# Patient Record
Sex: Male | Born: 1945 | Race: White | Hispanic: No | Marital: Married | Smoking: Never smoker
Health system: Southern US, Community
[De-identification: ages and names within clinical notes are randomized; demographics above are authoritative.]

## PROBLEM LIST (undated history)

## (undated) ENCOUNTER — Emergency Department (HOSPITAL_COMMUNITY): Discharge status: Absent without leave | Payer: Medicare Other

## (undated) DIAGNOSIS — C801 Malignant (primary) neoplasm, unspecified: Secondary | ICD-10-CM

## (undated) DIAGNOSIS — Z923 Personal history of irradiation: Secondary | ICD-10-CM

## (undated) DIAGNOSIS — C61 Malignant neoplasm of prostate: Secondary | ICD-10-CM

## (undated) HISTORY — PX: PROSTATE BIOPSY: SHX241

## (undated) HISTORY — PX: PROSTATECTOMY: SHX69

## (undated) MED FILL — Dexamethasone Sodium Phosphate Inj 100 MG/10ML: INTRAMUSCULAR | Qty: 1 | Status: AC

---

## 1999-04-30 ENCOUNTER — Encounter: Payer: Self-pay | Admitting: Internal Medicine

## 1999-04-30 ENCOUNTER — Ambulatory Visit (HOSPITAL_COMMUNITY): Admission: RE | Admit: 1999-04-30 | Discharge: 1999-04-30 | Payer: Self-pay | Admitting: Internal Medicine

## 2001-10-30 ENCOUNTER — Encounter: Payer: Self-pay | Admitting: Urology

## 2001-11-05 ENCOUNTER — Encounter (INDEPENDENT_AMBULATORY_CARE_PROVIDER_SITE_OTHER): Payer: Self-pay | Admitting: *Deleted

## 2001-11-05 ENCOUNTER — Inpatient Hospital Stay (HOSPITAL_COMMUNITY): Admission: RE | Admit: 2001-11-05 | Discharge: 2001-11-07 | Payer: Self-pay | Admitting: Urology

## 2001-12-04 ENCOUNTER — Ambulatory Visit: Admission: RE | Admit: 2001-12-04 | Discharge: 2001-12-26 | Payer: Self-pay | Admitting: Radiation Oncology

## 2003-03-31 ENCOUNTER — Ambulatory Visit: Admission: RE | Admit: 2003-03-31 | Discharge: 2003-06-04 | Payer: Self-pay | Admitting: Radiation Oncology

## 2004-01-31 ENCOUNTER — Encounter (INDEPENDENT_AMBULATORY_CARE_PROVIDER_SITE_OTHER): Payer: Self-pay | Admitting: Specialist

## 2004-01-31 ENCOUNTER — Ambulatory Visit (HOSPITAL_COMMUNITY): Admission: RE | Admit: 2004-01-31 | Discharge: 2004-01-31 | Payer: Self-pay | Admitting: Gastroenterology

## 2004-03-18 ENCOUNTER — Inpatient Hospital Stay (HOSPITAL_COMMUNITY): Admission: EM | Admit: 2004-03-18 | Discharge: 2004-03-22 | Payer: Self-pay | Admitting: Emergency Medicine

## 2006-07-11 ENCOUNTER — Inpatient Hospital Stay (HOSPITAL_COMMUNITY): Admission: EM | Admit: 2006-07-11 | Discharge: 2006-07-12 | Payer: Self-pay | Admitting: Emergency Medicine

## 2006-07-11 ENCOUNTER — Ambulatory Visit: Payer: Self-pay | Admitting: Internal Medicine

## 2009-02-01 ENCOUNTER — Ambulatory Visit (HOSPITAL_COMMUNITY): Admission: RE | Admit: 2009-02-01 | Discharge: 2009-02-01 | Payer: Self-pay | Admitting: Urology

## 2010-07-11 NOTE — H&P (Signed)
NAME:  George, Nichols NO.:  [id number]   MEDICAL RECORD NO.:  [id number]          PATIENT TYPE:  INP   LOCATION:  0102                         FACILITY:  [facility]   PHYSICIAN:  Ladell Pier, M.D.   DATE OF BIRTH:  September 15, 1945   DATE OF ADMISSION:  07/11/2006  DATE OF DISCHARGE:                              HISTORY & PHYSICAL   CHIEF COMPLAINT:  Shortness of breath.   HISTORY OF PRESENT ILLNESS:  The patient is a 65 year old, white male  that presents to the emergency room with shortness of breath.  He stated  that he woke up about 1 a.m. this morning, short of breath.  He  experienced no chest pain, no nausea, no vomiting.  He was worried  because he has a history of PE.  He had his Coumadin level checked this  morning, and his INR was 2.3.  He feels fine now.   PAST MEDICAL HISTORY:  1. 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  2. History of prostate cancer that he had a radical prostatectomy in      September 2003 with elevated PSA in March 2005, status post recent      radiation therapy.  Patient followed by Dr. Isabel Caprice.   FAMILY HISTORY:  His father is 24 years old with diabetes, history of  blood clot and skin cancer and multiple other problems.  His mother died  in her 63s from a motor vehicle accident.   SOCIAL HISTORY:  He is married.  He has a son that is 74 years old.  Moderate alcohol use.  He quit smoking 23 years ago.  He plays in a  band.  He is a Technical sales engineer.   MEDICATIONS:  Coumadin daily.   ALLERGIES:  PENICILLIN.   REVIEW OF SYSTEMS:  As per stated in HPI.   PHYSICAL EXAMINATION:  VITAL SIGNS:  Temperature 97.9, pulse 73,  respirations 20, blood pressure 120/79.  HEENT: Head is normocephalic, atraumatic.  Pupils equal, round and  reactive to light.  Throat without erythema.  CARDIOVASCULAR:  Regular rate and rhythm.  LUNGS: Clear bilaterally.  ABDOMEN:  Positive bowel sounds.  EXTREMITIES:  No  edema.   LABORATORY DATA:  Sodium 142, potassium 3.7, chloride 106, CO2 27, BUN  11, creatinine 0.97, glucose 110.  PT 29.7, INR 2.6.  WBC 7.9,  hemoglobin 15.3, platelet 244.  Cardiac enzymes negative.  EKG showed a  flutter, now in normal sinus rhythm.   ASSESSMENT/PLAN:  1. A flutter/shortness of breath:  The patient's EKG findings have      resolved.  Will repeat the EKG in the morning.      Will cycle enzymes.  Will get a 2-D echo.  Will continue his      Coumadin therapy.  He had a CT of the chest done in the ER; results      are pending.  2. PE:  Will continue his Coumadin therapy.      Ladell Pier, M.D.  Electronically Signed     NJ/MEDQ  D:  07/11/2006  T:  07/11/2006  Job:  [id number]   cc:   Merlene Laughter. Renae Gloss, M.D.  Fax: [phone]

## 2010-07-11 NOTE — Discharge Summary (Signed)
NAME:  [name], [name] NO.:  [id number]   MEDICAL RECORD NO.:  [id number]          PATIENT TYPE:  INP   LOCATION:  1437                         FACILITY:  [facility]   PHYSICIAN:  Ellie Lunch, M.D.      DATE OF BIRTH:  [birth year]   DATE OF ADMISSION:  07/11/2006  DATE OF DISCHARGE:  07/12/2006                               DISCHARGE SUMMARY   PRIMARY CARE PHYSICIAN:  Dr. Andi Devon.   DISCHARGE DIAGNOSES:  Atrial flutter with spontaneous resolution.  (Enzymes were negative, the patient ruled out for a myocardial  infarction.  TSH was negative.  A 2D echo was pending on the day of  discharge.)   PAST MEDICAL HISTORY:  1. History of  DVT.  2. History of prostate cancer that required radical prostatectomy in      September of 2003.  Note the patient's PSA checked in the hospital      was 0.8.  3. Low LDL at 35.   DISCHARGE MEDICATIONS:  1. Coumadin as previously taken.  2. Fish oil supplements 1000 mg p.o. once a day.   FOLLOW UP:  The patient will follow-up with Dr. Renae Gloss within the next  month. Please follow up on 2Decho report since it was pending on the day  of discharge. Note if he has further episodes of atrial flutter, he may  be referred to a cardiologist for radiofrequency ablation.   ADMISSION HISTORY AND PHYSICAL:  [name] is a very pleasant 65-year-  old white male that presented to the emergency department with shortness  of breath that woke  him up at 1 AM in the morning.  He experienced no  chest pain, nausea or vomiting.  He was worried because he had a history  of pulmonary embolism.  He had a Coumadin level checked on the morning  of admission and his INR was 2.3.   HOSPITAL COURSE:  The patient was admitted for further monitoring.  He  was monitored from a tele bed.  He was noted to have atrial flutter on  the EKG that was taken in the emergency department.  Enzymes were cycled  which were negative.  Also a 2D echocardiogram was  done, the preliminary  reading of which was normal.  A TSH was also checked which was normal at  1.3.  His atrial flutter spontaneously resolved.  Also note hemoglobin  was 14.9.  Thus, the patient is being discharged to home.  If he would  have further episodes, he can be referred to a cardiologist for further  evaluation.   DISCHARGE LABORATORY DATA:  TSH 1.314, PSA 0.8, hemoglobin 14.9,  creatinine 1.17, INR is 3.  Total cholesterol was 167, triglycerides 80,  HDL 35, LDL 116.      Ellie Lunch, M.D.  Electronically Signed     BP/MEDQ  D:  07/12/2006  T:  07/12/2006  Job:  [id number]   cc:   Merlene Laughter. Renae Gloss, M.D.

## 2010-07-14 NOTE — Discharge Summary (Signed)
   NAME:  [name], [name] NO.:  [id number]   MEDICAL RECORD NO.:  [id number]                   PATIENT TYPE:  INP   LOCATION:  0371                                 FACILITY:  [facility]   PHYSICIAN:  Valetta Fuller, M.D.               DATE OF BIRTH:  1945-09-04   DATE OF ADMISSION:  11/05/2001  DATE OF DISCHARGE:  11/07/2001                                 DISCHARGE SUMMARY   DISCHARGE DIAGNOSIS:  Adenocarcinoma of the prostate, pathologic stage pT3b.   PROCEDURE PERFORMED:  Pelvic lymph node dissection and radical retropubic  prostatectomy on November 05, 2001.   HOSPITAL COURSE:  The patient is an otherwise healthy 65 year old male.  He  was noted to have a rising PSA which had increased to approximately 7.  He  had a biopsy which revealed a positive biopsy with a Gleason 4 + 3 = 7  adenocarcinoma of the prostate.  After extensive counseling, the patient  elected to have pelvic lymph node dissection and radical retropubic  prostatectomy.  His admission data and physical exam were unremarkable.   On November 05, 2001 the patient underwent pelvic lymph node exploration  without obvious gross nodal involvement.  His radical retropubic  prostatectomy was fairly uneventful.  There was moderate blood loss but his  hemoglobin remained stable.  His postoperative course was quite uneventful.  He remained afebrile with stable vital signs.  His hemoglobin came down to  approximately 10.7.  The rest of his labs were normal.  He had excellent  urinary output with minimal JP drainage.  The JP was removed on  postoperative day #2.  The patient was ambulating at that time and  tolerating a general diet well.  He requested to go home on postoperative  day #2, and at that point again was doing quite well clinically.  He was  discharged to home.   DISPOSITION:  The patient was discharged to home with a Foley catheter  indwelling to a leg bag.  He was given some pain  medication to help and will  be seen in our office in one week for staple removal and 10 days for Foley  catheter removal.                                                 Valetta Fuller, M.D.    DSG/MEDQ  D:  11/17/2001  T:  11/17/2001  Job:  [id number]

## 2010-07-14 NOTE — Op Note (Signed)
NAME:  [name], [name] NO.:  [id number]   MEDICAL RECORD NO.:  [id number]          PATIENT TYPE:  AMB   LOCATION:  ENDO                         FACILITY:  [facility]   PHYSICIAN:  Anselmo Rod, M.D.  DATE OF BIRTH:  [birth year]   DATE OF PROCEDURE:  01/31/2004  DATE OF DISCHARGE:                                 OPERATIVE REPORT   PROCEDURE PERFORMED:  Colonoscopy with biopsies times two (cold biopsies).   ENDOSCOPIST:  Charna Elizabeth, M.D.   INSTRUMENT USED:  Olympus video colonoscope.   INDICATIONS FOR PROCEDURE:  The patient is a 65 year old white male with a  personal history of prostate cancer diagnosed and treated in 2003 undergoing  screening colonoscopy to rule out colonic polyps, masses, etc.   PREPROCEDURE PREPARATION:  Informed consent was procured from the patient.  The patient was fasted for eight hours prior to the procedure and prepped  with a bottle of magnesium citrate and a gallon of GoLYTELY the night prior  to the procedure.   PREPROCEDURE PHYSICAL:  The patient had stable vital signs.  Neck supple.  Chest clear to auscultation.  S1 and S2 regular.  Abdomen soft with normal  bowel sounds.   DESCRIPTION OF PROCEDURE:  The patient was placed in left lateral decubitus  position and sedated with 5 mg of Demerol and 6 mg of Versed in slow  incremental doses.  Once the patient was adequately sedated and maintained  on low flow oxygen and continuous cardiac monitoring, the Olympus video  colonoscope was advanced from the rectum to the cecum.  The appendicular  orifice and ileocecal valve were clearly visualized and photographed.  A  small erosion was biopsied times two from the terminal ileum.  No masses or  diverticula were seen.  No polyps were identified.  Retroflexion in the  rectum revealed a small internal hemorrhoid.  The patient tolerated the  procedure well without complication.   IMPRESSION:  1.  Essentially normal colonoscopy up to the  cecum except for small internal      hemorrhoids.  2.  Small erosion biopsied times two from the terminal ileum.  3.  No masses, polyps, or diverticula were seen.   RECOMMENDATIONS:  1.  Await pathology results.  2.  Repeat colonoscopy is recommended in the next five years unless the      patient develops any abnormal symptoms in the interim.  3.  Outpatient followup as need arises in the future.      Jyot   JNM/MEDQ  D:  01/31/2004  T:  01/31/2004  Job:  [id number]   cc:   Merlene Laughter. Renae Gloss, M.D.  [address]  [address]  [place]  [place] [address]  Fax: [phone]   Valetta Fuller, M.D.  [address]  [place]  [place] [address]  Fax: [phone]   Maryln Gottron, M.D.  [address] [facility]  [place]  [place] [address]  Fax: [phone]

## 2010-07-14 NOTE — Op Note (Signed)
[name], FACKLER NO.:  [id number]   MEDICAL RECORD NO.:  [id number]                   PATIENT TYPE:  INP   LOCATION:  0371                                 FACILITY:  [facility]   PHYSICIAN:  Valetta Fuller, M.D.               DATE OF BIRTH:  30-May-1945   DATE OF PROCEDURE:  11/05/2001  DATE OF DISCHARGE:                                 OPERATIVE REPORT   PREOPERATIVE DIAGNOSIS:  Clinical stage T1C adenocarcinoma of the prostate.   POSTOPERATIVE DIAGNOSIS:  Clinical state T1C adenocarcinoma of the prostate.   PROCEDURE PERFORMED:  __________ dissection and radical retropubic  prostatectomy.   SURGEON:  Valetta Fuller, M.D.   ASSISTANT:  Crecencio Mc, MD   ANESTHESIA:  General endotracheal.   INDICATIONS:  The patient is a 65 year old male.  He has been noted by his  primary care physician to have a rising PSA.  It has approximately doubled  for two years.  His PSA when we evaluated him was approximately 7 with a  significantly reduced PSA 2 reading.  The left-sided biopsies revealed  adenocarcinoma of the prostate.  The Gleason score was 4 + 3 = 7.  After  discussion, we elected not to do bone scan or CT imaging.  The patient  understands he has at least a 40% chance of having microscopic disease  outside his prostate.  He has a relatively small prostate on exam and no  clinical symptoms.  The patient underwent extensive counseling with regard  to treatment options.  We felt that given his young age and good health,  that radical retropubic prostatectomy was probably the best option for him.  He appeared to understand the benefits of this approach as well as the  potential complications.  The complications include, but are not limited to  operative or perioperative death from cardiac or pulmonary complications  such as DVT, pulmonary embolus, etc.  He understands there is a significant  risk of bleeding and potential for transfusion.  He  understands there is a  risk of infection.  We talked about impotence.  He was told that he would  probably have excision of the left bundle, given the 40% of the biopsy  material being involved with the more aggressive cancer on that side.  The  determination will be made at the time of surgery.  He understands the  issues with regard to __________ and stress incontinence.  Full informed  consent was obtained.   TECHNIQUE AND FINDINGS:  The patient was brought to the operating room where  he had successful induction of general endotracheal anesthesia.  He was  placed in the supine position with a small bump under the small of his back.  He was prepped and draped in the normal manner.  A Foley catheter was placed  sterilely on the field, and the bladder was drained.  A standard lower  midline incision was  performed, and the retropubic space was entered.  A  self-retaining retractor was utilized.  Attention was turned to the lymph  nodes, and we removed the obturator packets bilaterally.  These were sent  for permanent section only.  We did not appreciate any evidence of obvious  clinical adenopathy.  The prostate was mobile and approximately 30 g in  size.  The endopelvic fascia was opened bilaterally, and the puboprostatic  ligaments were identified and transected.   Attention was then turned toward the dorsal vein.  A large radial clamp was  placed between the urethra and the dorsal vein complex and doubly ligated.  A suture ligature was also placed, and the dorsal vein was then transected.  The underlying urethra was identified, was dissected free laterally of the  neurovascular bundles.  The anterior aspect of the urethra was then  transected near the apex of the prostate, and the Foley catheter was grabbed  and brought out the pelvic incision after transecting it.  The posterior  wall of the urethra was then transected.  We sharply dissected some  rectourethralis tissue, and then  the posterior plane of the rectum was very  easy to establish.  Neurovascular bundles were identified.  The prostate was  palpated, and there was induration in the left lateral aspect of the  prostate.  For that reason, we made an incision to take the left bundle  widely.  On the right side, we did open up the endopelvic fascia off the  prostate and attempted a nerve-sparing procedure.  The underlying seminal  vesicles were identified which then allowed to establish the proper plane  for the pedicles which were taken with a series of clips and ties.   Attention was then turned to the bladder neck.  Using a combination of sharp  and blunt dissection technique, the bladder neck was separated from the  prostate, attempting to preserve the majority of the circular fibers.  There  was a small middle lobe, so we did make a slightly larger bladder neck  opening to assure adequate margins.  We then were able to identify both  ureteral orifices without difficulty.  The vas were identified in the  midline and clipped and transected.  The seminal vesicles were then  dissected out; the seminal vesicle arteries were clipped, and the entire  specimen was removed.  There was a moderate amount of blood loss at this  point.  The patient remained hemodynamically stable.  We did not feel  transfusion was indicated.  The patient then had a small degree of  reconstruction of the bladder neck in a tennis racquet type of manner.  Approximately two sutures were used to buttress this posteriorly.  The  mucosa was then everted with some interrupted Vicryl suture.  We then used a  urethral dilator to establish the urethral stump.  Then 2-0 Vicryl were used  in five places.  Two were placed posteriorly, two laterally, and one  directly anteriorly.  The sutures were then placed in the corresponding  positions of the reconstructed bladder neck, and this was done over a 22 [place] Foley catheter.  The sutures were then  tied, and the anastomosis  appeared very secure.  No leakage was noted with significant irrigation.  A  Jackson-Pratt drain was placed in the retropubic space.  The wound was  copiously irrigated.  The fascia was closed with a running #1 PDS, and the  skin was closed with clips.  He appeared to tolerate  the procedure well.  He  was brought to the recovery room in stable condition.                                               Valetta Fuller, M.D.    DSG/MEDQ  D:  11/05/2001  T:  11/06/2001  Job:  [id number]

## 2010-07-14 NOTE — H&P (Signed)
NAME:  [name], [name] NO.:  [id number]   MEDICAL RECORD NO.:  [id number]          PATIENT TYPE:  EMS   LOCATION:  ED                           FACILITY:  [facility]   PHYSICIAN:  Hettie Holstein, D.O.    DATE OF BIRTH:  12/01/1945   DATE OF ADMISSION:  03/18/2004  DATE OF DISCHARGE:                                HISTORY & PHYSICAL   PRIMARY CARE PHYSICIAN:  Merlene Laughter. Renae Gloss, M.D.   GASTROENTEROLOGIST:  Anselmo Rod, M.D.   CHIEF COMPLAINT:  Passed out and leg swelling.   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  He stated that he fell  twice today at home.  His wife witnessed one episode.  She said this loss of  consciousness lasted less than a minute.  He had some shortness of breath  and diaphoresis as well as some chest discomfort.  He had been traveling  quite extensively with his music.  He went on an extensive road trip in  October and November.  Had some lower leg swelling at that time. In  addition, he had some increasing leg swelling over the last three or four  days.  His wife says she noticed some bruising. Did not seek any medical  attention.  He attributed all this to old basketball injuries in the past.  He does continue to remain active, exercising daily and doing yoga, however.  He, as noted above, has felt short of breath.  He went to the emergency  department, Dr. Bruce Donath evaluated him and performed CT scan that did  reveal extensive bilateral pulmonary emboli and he started on a heparin  drip.  He was hemodynamically stable in the emergency department and initial  lab evaluations were within normal limits.  He did have evidence of  incomplete right bundle branch block on his EKG.   PAST MEDICAL HISTORY:  1.  The patient underwent most recently endoscopic evaluation by Dr. Charna Elizabeth and this revealed some inflammatory tissue.  2.  In addition, he has a  previous history of prostate cancer, stage T-III-      B, diagnosed September 2003.  He underwent radical prostatectomy and      lymph node dissection at that time and underwent radiation therapy in      April 29, 2003 due to elevating PSA.  He states that this recently      trended down to 0 following radiation therapy.  Currently, the family      requests the perform a repeat PSA this admission.  3.  On review of chest x-rays in the past, he was noted to have fibrotic      changes.  Explanation of these is not clear at this time.   SOCIAL HISTORY:  The patient quit smoking tobacco 20-30 years.  Moderate  alcohol. However, he denies withdrawal symptoms or heavy alcohol use.   FAMILY HISTORY:  He has one son, age 62.  Father is alive at age 86.  Had a  blood  clot during convalescence for a knee injury.  Mother died at age 63's  with an MVA.  There is hypertension in his family.   REVIEW OF SYSTEMS:  He has had no nausea or vomiting, diarrhea, weight loss,  abdominal pain.  Only shortness of breath and chest pain essentially with  HPI.  He has had lower extremity swelling for the past month or so.  Otherwise further review of systems is unremarkable.   REVIEW OF SYSTEMS:  GENERAL:  The patient denies fevers, chills, night  sweats.  Denies cephalgia.  CARDIOVASCULAR:  The patient denies chest pain,  palpitations.  RESPIRATORY:  Denies shortness of breath.  Does report cough  with frequent expectoration of mucus.  However, she says this is chronic.  She denies hemoptysis.  GI:  She does report positive nausea but no  vomiting, no hematemesis.  No hematochezia or melena was reported and she  denies orthopnea or PND or exertional dyspnea.  She reports no swelling of  her lower extremities.  No calf tenderness.   PHYSICAL EXAMINATION:  VITAL SIGNS:  The patient's blood pressure in the  emergency department revealed 114/69, heart rate 99.  O2 saturation was 93%  on room air.  GENERAL:   Pleasant, alert, Caucasian male incision and drainage.  Alert and  oriented x3.  HEENT:  Normocephalic, atraumatic.  Extraocular muscles were intact.  Oropharynx clear.  NECK:  Supple, nontender.  CARDIOVASCULAR:  Normal S1 and S2 without S3 or S4, or murmur.  PULMONARY:  Clear breath sounds bilaterally.  Normal effort.  No dullness to  percussion.  ABDOMEN:  Soft, nontender. No palpitation mass or hepatosplenomegaly.  No  suprapubic or costovertebral angle tenderness.  NEUROLOGIC:  The patient is euthymic.  His affect is stable.  There are no  focal neurologic deficits.  EXTREMITIES:  Edema in the left leg greater than right and some minimal  tenderness.   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 Urinalysis was unremarkable. Initial  point of care obtained was negative.  Urinalysis was negative.  PTT was 25.  Sodium 134, potassium 36, BUN 16, creatinine 1.2, glucose 111, AST/ALT  24/19.  Alk phos 46, albumin 3.3.  WBC 10.6, hemoglobin 13.9, platelet count  188. MCV normal.  Colonoscopy reviewed from December 2005, performed by Dr.  Loreta Ave.  Pathology report reviewed.   IMPRESSION:  1.  Acute bilateral pulmonary emboli.  2.  History of prostate cancer, status post radiation therapy in March.  3.  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  4.  Hypokalemia and hypoalbuminemia.   PLAN:  Going to admit [name] to a telemetry floor for close monitoring  as initiated on heparin drip in the emergency department.  Will continue  this for 24 hours and then initiated Coumadin per pharmacy.  Follow his  course to therapeutic INR between 2 and 3.  Follow his hemodynamics closely  and replete his potassium and provide Coumadin instructions.  I will check  his cardiac markers in addition and continue to follow his course to  therapeutic INR.     Eric  ESS/MEDQ  D:  03/18/2004  T:  03/18/2004  Job:   [id number]   cc:   Merlene Laughter. Renae Gloss, M.D.  [address]  [address]  [place]  [place] [address]  Fax: [phone]

## 2010-07-14 NOTE — H&P (Signed)
NAME:  [name], [name] NO.:  [id number]   MEDICAL RECORD NO.:  [id number]                   PATIENT TYPE:  INP   LOCATION:  Z610                                 FACILITY:  [facility]   PHYSICIAN:  Valetta Fuller, M.D.               DATE OF BIRTH:  June 17, 1945   DATE OF ADMISSION:  11/05/2001  DATE OF DISCHARGE:                                HISTORY & PHYSICAL   CHIEF COMPLAINT:  Clinical stage II, 1C adenocarcinoma of the prostate.   HISTORY OF PRESENT ILLNESS:  The patient is a 65 year old male.  He was sent  because of an asymptomatic rise in his PSA.  His PSA had been increasing  over approximately a two-year period.  It was approximately 7.  Digital  rectal exam was unremarkable.  He underwent an ultrasound which showed a 26-  gram prostate.  He had 30% of the material on the left side involved with a  Gleason 4+3 equals 7 adenocarcinoma of the prostate.  We did not do CT or  bone scan given his PSA of less than 10.  He understands he has at least a  40% chance of having microscopic disease outside his prostate.  The patient  has undergone extensive counseling with regard to treatment options.  He has  elected to have radical retropubic prostatectomy.  He presents now for that  procedure and will be admitted postoperatively for routine care.  He has no  voiding complaints or other clinical symptoms.   PAST MEDICAL HISTORY:  Relatively unremarkable.  He generally enjoys good  health.  He has no systemic medical illnesses and takes no medication  regularly.  He has no drug allergies and, really, no surgical history.  He  has a remote tobacco use history.   FAMILY HISTORY:  Negative for prostate cancer.   REVIEW OF SYSTEMS:  Otherwise negative.   PHYSICAL EXAMINATION:  GENERAL:  Well-developed, well-nourished male.  VITAL SIGNS:  Height 6 feet 4 inches, weight 210 pounds.  Blood pressure  122/70, pulse 60.  NECK:  Without masses or JVD.  CHEST:   Clear.  ABDOMEN:  Soft and nontender.  RECTAL:  The prostate is 1+ in size, without worrisome nodules or  induration.  GENITOURINARY:  Penis, scrotum, testes, and adnexal structures are normal.  EXTREMITIES:  Without edema or tenderness.   LABORATORY DATA:  All blood work was well within normal limits.   ASSESSMENT:  Clinical stage II, 1C adenocarcinoma of the prostate.  The  patient will undergo pelvic lymph node dissection and radical retropubic  prostatectomy today and will, hopefully, be admitted for routine  postoperative care.                                               Valetta Fuller,  M.D.    DSG/MEDQ  D:  11/05/2001  T:  11/05/2001  Job:  [id number]

## 2010-07-14 NOTE — Discharge Summary (Signed)
NAME:  [name], [name] NO.:  [id number]   MEDICAL RECORD NO.:  [id number]          PATIENT TYPE:  INP   LOCATION:  0363                         FACILITY:  [facility]   PHYSICIAN:  [name], M.D. DATE OF BIRTH:  [birth year]   DATE OF ADMISSION:  03/18/2004  DATE OF DISCHARGE:                                 DISCHARGE SUMMARY   PRIMARY CARE PHYSICIAN:  Dr. Kellie Nichols.   FINAL DIAGNOSES:  1.  Bilateral pulmonary embolism.  2.  Left leg deep vein thrombosis.   PROCEDURES:  1.  Venous duplex of both legs.  2.  Chest CT scan with angiography.   HISTORY OF PRESENT ILLNESS:  [name] is a 65 year old gentleman who  arrived in the hospital's emergency department with a chief complaint of  passing out and leg swelling.  He stated that he fell twice while at home on  the day of admission.  His wife was a witness to one of the episodes and  stated that the patient lost consciousness for approximately 1 minute.  He  had also displayed shortness of breath, diaphoresis, and chest discomfort.  He is a traveling musician and reported that he has been traveling quite  extensively.  His previous trips were road trips during the months of  October and November.  During that time he noticed some swelling in his leg.  He also mentioned progressive leg swelling over the 3-4 days leading up to  this admission.  Some bruising was also noticed in that region.   PAST MEDICAL HISTORY:  1.  Endoscopy by Dr. Anselmo Nichols which revealed inflammatory tissue.  2.  Prostate cancer stage T3b diagnosed September 2003.  He underwent      radical prostatectomy and lymph node dissection followed by radiation      therapy on April 29, 2003 due to a PSA elevation.  3.  Fibrotic changes noticed on previous chest x-rays, etiology for which is      not clear.   SOCIAL HISTORY:  Cigarettes:  The patient stopped 20-30 years ago.  Alcohol:  Positive moderate alcohol.   FAMILY HISTORY:   [name] had a blood clot.  [name] died at the age of 17s  following a motor vehicle accident.   HOSPITAL COURSE:  While in the emergency room, the patient had a chest x-ray  completed, the final impression of which was:  1.  Mild cardiomegaly with vascular congestion.  2.  Stable nodular opacity in the posterior right fourth rib, probably a      bone island given the greater than 2 year interval stability.   This was followed by CT scan of the chest with angio.  The radiologist's  final impression was:  1.  Extensive bilateral acute pulmonary emboli.  2.  Right upper chest subpleural partially calcified nodule, nonspecific in      appearance.  Recommend follow-up to document stability in 3-6 months.  3.  Bibasilar atelectasis.   The patient was admitted into the medicine floor for further evaluation and  treatment.  He was started on IV heparin per PE protocol.  Likewise,  this  was followed by Coumadin.  His INR was slow to respond to the Coumadin and  the patient remained very anxious to be discharged from the hospital.  Therefore, the decision was made to start the patient on Lovenox to act as a  bridge while the Coumadin becomes therapeutic.  As of today, the patient's  INR is currently 1.4.  Over the course of his hospitalization the patient  never complained of any chest pain or shortness of breath.  Likewise, the  patient did not have any repeat episodes of passing out.   LEFT LOWER EXTREMITY DEEP VEIN THROMBOSIS.  As mentioned previously the  patient stated that over the past couple of days prior to this admission he  noticed his lower extremity swelling progressively.  Venous studies of the  lower extremity were completed, the final impression of which was within the  left leg there appears to be a DVT throughout the common femoral, profunda,  popliteal, and posterior tibial veins.  There is no evidence of superficial  thrombosis or Bakers cyst.  In addition, the right leg was  also examined.  The final impression of that was no evidence of DVT, superficial thrombosis,  or Bakers cyst.  Over the course of the patient's hospitalization, he did  not complain of any pain within his lower extremity but did mention that  there was some swelling that was evident within the left lower extremity.  Today, the patient has no current symptoms.  He states that he is ready to  go home today.  He denies having any current shortness of breath, no chest  pain.  He has been ambulating around the nurse's station without any  problems or signs of distress.  His vital signs this morning:  His  temperature was 97.8, heart rate 64, respirations 22, and blood pressure  114/63.  SPO2 was 98% on lung air.  His lungs are clear bilaterally.  The  patient also had some labs completed.  He had a protein C which was 79,  protein S which was 73 - both of which were slightly below the reference  range.  He also had a lupus anticoagulant study completed, the results of  which showed the lupus anticoagulant PTT-LA to be 186.  The dRVTT is 39.4.  There is also the reporting of another PTT-LA confirmation which is found to  be 5.7.  The lupus anticoagulant on this second study is found to be none  detected.  The patient had a homocysteine level which was reported to be  11.25.   CONDITION AT THE TIME OF DISCHARGE:  Improved.  The patient will be  discharged home today.  His primary care physician, Dr. Kellie Nichols, has  already been called and notified of his current medical condition.   He will be discharged home on the following medications:  1.  Coumadin 7.5 mg tablet - he will be instructed to take two tablets      tonight.  Tomorrow he should take one-and-a-half tablets, then on      Friday, January 27, he will report to Dr. Augustin Nichols office at 9      a.m. at which time his INR should be rechecked and following this she     will instruct the patient the dosage of his Coumadin to  take.  2.  In addition, the patient will be discharged home on Lovenox 100 mg subcu      q.12h.  The patient received education regarding  anticoagulation and      also has informed me today that he received instructions on how to      inject himself with the Lovenox and he states that he feels comfortable      doing so.      OR/MEDQ  D:  03/22/2004  T:  03/22/2004  Job:  [id number]   cc:   Merlene Laughter. Renae Gloss, M.D.  [address]  [address]  [place]  [place] [address]  Fax: [phone]

## 2011-02-23 ENCOUNTER — Other Ambulatory Visit (HOSPITAL_COMMUNITY): Payer: Self-pay | Admitting: Urology

## 2011-02-23 DIAGNOSIS — C61 Malignant neoplasm of prostate: Secondary | ICD-10-CM

## 2011-02-26 ENCOUNTER — Telehealth: Payer: Self-pay | Admitting: Oncology

## 2011-02-26 NOTE — Telephone Encounter (Signed)
S/w pt re appt for 1/10 @ 10:30 am w/FS

## 2011-02-28 ENCOUNTER — Telehealth: Payer: Self-pay | Admitting: Oncology

## 2011-02-28 NOTE — Telephone Encounter (Signed)
Referred by Dr. Isabel Caprice Dx- Prostate Ca

## 2011-03-07 ENCOUNTER — Other Ambulatory Visit: Payer: Self-pay | Admitting: Oncology

## 2011-03-07 DIAGNOSIS — C61 Malignant neoplasm of prostate: Secondary | ICD-10-CM

## 2011-03-08 ENCOUNTER — Telehealth: Payer: Self-pay | Admitting: Oncology

## 2011-03-08 ENCOUNTER — Other Ambulatory Visit (HOSPITAL_BASED_OUTPATIENT_CLINIC_OR_DEPARTMENT_OTHER): Payer: Self-pay

## 2011-03-08 ENCOUNTER — Ambulatory Visit: Payer: Self-pay | Admitting: Oncology

## 2011-03-08 ENCOUNTER — Ambulatory Visit: Payer: Self-pay

## 2011-03-08 VITALS — BP 137/80 | HR 66 | Temp 96.9°F | Ht 74.4 in | Wt 232.4 lb

## 2011-03-08 DIAGNOSIS — C61 Malignant neoplasm of prostate: Secondary | ICD-10-CM

## 2011-03-08 LAB — COMPREHENSIVE METABOLIC PANEL
CO2: 26 mEq/L (ref 19–32)
Calcium: 9.2 mg/dL (ref 8.4–10.5)
Chloride: 104 mEq/L (ref 96–112)
Creatinine, Ser: 1.03 mg/dL (ref 0.50–1.35)
Glucose, Bld: 103 mg/dL — ABNORMAL HIGH (ref 70–99)
Sodium: 140 mEq/L (ref 135–145)
Total Bilirubin: 0.6 mg/dL (ref 0.3–1.2)
Total Protein: 7.1 g/dL (ref 6.0–8.3)

## 2011-03-08 LAB — CBC WITH DIFFERENTIAL/PLATELET
Basophils Absolute: 0 10*3/uL (ref 0.0–0.1)
Eosinophils Absolute: 0.1 10*3/uL (ref 0.0–0.5)
HGB: 14 g/dL (ref 13.0–17.1)
LYMPH%: 18.2 % (ref 14.0–49.0)
MCV: 88 fL (ref 79.3–98.0)
MONO%: 3.8 % (ref 0.0–14.0)
NEUT#: 5.6 10*3/uL (ref 1.5–6.5)
Platelets: 222 10*3/uL (ref 140–400)

## 2011-03-08 NOTE — Progress Notes (Signed)
Note Dictated

## 2011-03-08 NOTE — Telephone Encounter (Signed)
gve the pt his march 2013 appts. °

## 2011-03-08 NOTE — Progress Notes (Signed)
CC:   Merlene Laughter. Renae Gloss, M.D. Valetta Fuller, MD  REASON FOR CONSULTATION:  Prostate cancer.  HISTORY OF PRESENT ILLNESS:  This is a 66 year old gentleman, native of [place], lived the majority of his life around this area.  He currently works as a Technical sales engineer and has done so for the majority of his life.  He travels rather extensively for different performances.  He has a diagnosis of prostate cancer dating back through 2003.  He had presented with an elevated PSA of up to 6.45.  He underwent biopsy that was done on October 09, 2001, which showed a Gleason score 4 + 3 equals 7 adenocarcinoma of the left prostate, occupying about 30%.  The patient subsequently underwent a prostatectomy and lymph node dissection that was done on 11/05/2001.  Case number [id number] showed a prostatic adenocarcinoma, Gleason score 4 + 3 equals 7 involving both the right and the left prostate.  There was focal extracapsular extension identified.  Both seminal vesicles were involved with adenocarcinoma. Resection margins were free of tumor, indicating a stage T3b N0.  He had 10 lymph nodes sampled; none of them were involved with the cancer.  The patient subsequently has done relatively well.  However, his PSA started to rise again 3 years later.  Approximately about 18 months later, his PSA became detectable and he received radiation therapy under the care of Dr. Dayton Scrape.  He received a total of 6480 cGy in 36 sessions between February and April 2005.  Subsequently the patient had a continued rise in PSA up to 35 in August 2011 and at that time he was started on Lupron and in December 2011 it went down to 4.11.  In April 2012 it was down to 3.17 and 3.8 in August 2012, and most recently was up to 5.91 with a testosterone level of 35.  That was done on February 22, 2011.  For that reason, the patient was referred to me for evaluation.  Clinically he is asymptomatic.  He is scheduled to have a bone scan in  the near future for restaging purposes, but he is not reporting any back pain, is not reporting any shoulder pain, is not reporting any hip pain, has not had any genitourinary complaints.  He continues to have excellent performance status, exercising regularly, and again performing most activities of daily living without any hindrance or decline.  REVIEW OF SYSTEMS:  He does not report any headaches, blurry vision, double vision.  Does not report any motor or sensory neuropathy.  Does not report any alteration in mental status.  Does not report any psychiatric issues or depression.  Does not report any fever, chills, sweats.  Does not report any cough, hemoptysis, hematemesis.  No nausea or vomiting.  No abdominal pain.  No hematochezia or melena.  No genitourinary complaints.  Rest of review of systems is unremarkable.  PAST MEDICAL HISTORY:  Significant for history of back pain.  He has a history of atrial flutter.  He has also had a history of a pulmonary embolus a few years ago, maybe related to his travel at that time and he elected to be chronically anticoagulated for life at this time.  MEDICATIONS:  He is on Lupron, as well as Coumadin.  He also takes calcium and vitamin D supplements.  ALLERGIES:  To penicillin.  FAMILY HISTORY:  His father died of complications of diabetes.  Mother died of a car accident.  He has 1 brother who had head and neck cancer. No  known history of prostate cancer in his family.  SOCIAL HISTORY:  He is married.  He has 1 son.  He works as a Artist.  He had a remote smoking history, quit about 20 to 30 years ago.  No excessive alcohol consumption.  PHYSICAL EXAMINATION:  General:  Alert, awake gentleman, appeared in no active distress.  Vital Signs:  His blood pressure is 137/80, pulse is 66, respirations 20, he is afebrile at 96.9.  HEENT:  Head is normocephalic, atraumatic.  Pupils equal, round, and reactive to light. Oral  mucosa moist and pink.  Neck:  Supple.  No lymphadenopathy.  Heart: Regular rate and rhythm, S1 and S2.  Lungs:  Clear to auscultation.  No rhonchi, wheezes, or dullness to percussion.  Abdomen:  Soft, nontender. No hepatosplenomegaly.  Extremities:  No clubbing, cyanosis, or edema. Neurologically:  Intact motor, sensory, and deep tendon reflexes.  LABORATORY DATA:  Showed a hemoglobin of 14.0, white cell count 7.4, platelet count of 222.  ASSESSMENT AND PLAN:  This is a pleasant 66 year old gentleman with the following issues:  1. Prostate cancer.  He is a gentleman with a Gleason score of 4 + 3     equals 7 diagnosed in 2003.  He is status post prostatectomy,     followed by radiation therapy and currently on hormone therapy     after a PSA that has risen up to 35.  He had a reasonable response     to the PSA.  Unfortunately about 14 to 16 months later was found to     have a rise in his PSA despite castrate levels of testosterone.  I     had a lengthy discussion today with Mr. Shorey today, discussing     the natural course of prostate cancer, more specifically hormone     sensitive and possibly hormone resistant cancer.  I believe that he     is on the verge of becoming castration-resistant disease and just     about the right time for it for that appropriate Gleason score.  I     do agree with the 1st step of restaging purposes.  He is getting a     bone scan in the near future.  Terms of treatment options again     were outlined today in detail to Mr. Mcdonald today that include     combined androgen deprivation with addition of Casodex to Lupron     that would be a possibility, given his testosterone is approaching     castrate level, but not quite below 20, so that certainly could be     a possibility.  Second-line hormone manipulation with ketoconazole,     prednisone, or Zytiga are also a possibility.  I also talked to him     about the role of Provenge immunotherapy and I  think he will be an     excellent candidate for it should he have measurable disease.  I     also talked about the role of chemotherapy, as well as clinical     trials.  At this time, there is really no role for chemotherapy at     this time given the fact that he is really asymptomatic and has no     measurable disease, but certainly a possibility down the line in     the future.  The plan will be at this point to have a quick     followup  in about 2 months' time, also see the results of the bone     scan and we will decide on treatment at that time. 2. Pulmonary embolus.  At this time, he is chronically anticoagulated.     I am not really in favor of stopping his anticoagulation at this     time given the fact that he feels uncomfortable doing so.    ______________________________ Benjiman Core, M.D. FNS/MEDQ  D:  03/08/2011  T:  03/08/2011  Job:  [id number]

## 2011-03-09 ENCOUNTER — Encounter: Payer: Self-pay | Admitting: Oncology

## 2011-03-09 NOTE — Progress Notes (Signed)
Patient approve for 100% Discount 02/23/11 - 08/24/11

## 2011-03-12 ENCOUNTER — Encounter (HOSPITAL_COMMUNITY)
Admission: RE | Admit: 2011-03-12 | Discharge: 2011-03-12 | Disposition: A | Discharge status: Home / Self Care | Payer: Self-pay | Source: Ambulatory Visit | Attending: Urology | Admitting: Urology

## 2011-03-12 ENCOUNTER — Encounter (HOSPITAL_COMMUNITY): Payer: Self-pay

## 2011-03-12 DIAGNOSIS — C61 Malignant neoplasm of prostate: Secondary | ICD-10-CM | POA: Insufficient documentation

## 2011-03-12 HISTORY — DX: Malignant (primary) neoplasm, unspecified: C80.1

## 2011-03-12 MED ORDER — TECHNETIUM TC 99M MEDRONATE IV KIT
25.0000 | PACK | Freq: Once | INTRAVENOUS | Status: AC | PRN
  Administered 2011-03-12: 25 via INTRAVENOUS

## 2011-05-09 ENCOUNTER — Telehealth: Payer: Self-pay | Admitting: Oncology

## 2011-05-09 ENCOUNTER — Ambulatory Visit (HOSPITAL_BASED_OUTPATIENT_CLINIC_OR_DEPARTMENT_OTHER): Payer: Self-pay | Admitting: Oncology

## 2011-05-09 ENCOUNTER — Other Ambulatory Visit (HOSPITAL_BASED_OUTPATIENT_CLINIC_OR_DEPARTMENT_OTHER): Payer: Self-pay | Admitting: Lab

## 2011-05-09 VITALS — BP 131/78 | HR 63 | Temp 97.0°F | Ht 74.4 in | Wt 232.2 lb

## 2011-05-09 DIAGNOSIS — C61 Malignant neoplasm of prostate: Secondary | ICD-10-CM

## 2011-05-09 LAB — CBC WITH DIFFERENTIAL/PLATELET
Basophils Absolute: 0 10*3/uL (ref 0.0–0.1)
Eosinophils Absolute: 0.2 10*3/uL (ref 0.0–0.5)
HCT: 40.3 % (ref 38.4–49.9)
HGB: 13.7 g/dL (ref 13.0–17.1)
MCV: 90.7 fL (ref 79.3–98.0)
MONO%: 6 % (ref 0.0–14.0)
NEUT#: 3.6 10*3/uL (ref 1.5–6.5)
NEUT%: 67.6 % (ref 39.0–75.0)
RDW: 13.3 % (ref 11.0–14.6)
lymph#: 1.2 10*3/uL (ref 0.9–3.3)

## 2011-05-09 LAB — COMPREHENSIVE METABOLIC PANEL
Albumin: 4 g/dL (ref 3.5–5.2)
BUN: 16 mg/dL (ref 6–23)
Calcium: 9.2 mg/dL (ref 8.4–10.5)
Chloride: 103 mEq/L (ref 96–112)
Creatinine, Ser: 1.02 mg/dL (ref 0.50–1.35)
Glucose, Bld: 102 mg/dL — ABNORMAL HIGH (ref 70–99)
Potassium: 4.5 mEq/L (ref 3.5–5.3)

## 2011-05-09 NOTE — Telephone Encounter (Signed)
gv pt appt schedule for may.  °

## 2011-05-09 NOTE — Progress Notes (Signed)
CC:   Merlene Laughter. Renae Gloss, M.D. Valetta Fuller, MD  PRINCIPAL DIAGNOSIS:  A 66 year old gentleman with prostate cancer diagnosed in 2003.  He had a Gleason score 4 + 3 equals 7.  PSA 6.45.  PRIOR THERAPY: 1. He is status post a prostatectomy and lymph node dissection done on     November 05, 2001.  He had a Gleason score 4 + 3 equals 7.  He had     focal extracapsular extension, both seminal vesicles were involved     indicating his stage was T3b N0. 2. The patient received a total of 6480 cGy of radiation therapy in 36     fractions between February and April 2005. 3. The patient had a rise in August of 2011 up to 35 and he was     started on Lupron.  His PSA nadir down to 4.11.  Most recently his     PSA was up to 5.91 in December of 2012 with a testosterone level of     35.  His staging workup did not reveal any bony metastasis.  CURRENT THERAPY:  He has continued to receive androgen deprivation with Lupron.  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  HISTORY OF PRESENT ILLNESS:  Mr. Leinberger presents today for a followup visit.  He is a pleasant gentleman who presents today after I saw him initially back in January of 2013.  He is reporting no new symptoms along at this time.  He is not reporting any abdominal pain.  He has not reported any genitourinary complaints.  He does not report any complication related to Lupron such as hot flashes or breast tenderness. He does not report any back pain.  He has not reported any shoulder pain.  Performance status and activity level remains unchanged.  REVIEW OF SYSTEMS:  Otherwise reviewed and was unremarkable.  PHYSICAL EXAMINATION:  Alert, awake gentleman appeared in no active distress.  Blood pressure is 131/78, pulse 63, respirations 20, temperature is 97.  Head:  Normocephalic, atraumatic.  Pupils equal, round, reactive to light.  Oral mucosa moist and pink.  Neck:   Supple without lymphadenopathy.  Heart:  Regular rate and rhythm.  S1, S2. Lungs:  Clear to auscultation without rhonchi or wheeze.  No dullness to percussion.  Abdomen:  Soft, nontender.  No hepatosplenomegaly. Extremities:  No edema.  LABORATORY DATA:  Hemoglobin 13.7, white cell count 5.3, platelet count 230.  His PSA is currently pending.  ASSESSMENT AND PLAN: 1. This is a pleasant 66 year old gentleman with prostate cancer.  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  I am     rechecking his testosterone level as well as his PSA.  If his PSA     is continuing to rise despite close to castrate level testosterone,     talked about adding Casodex for combined androgen deprivation.     Risks and benefits were discussed today, toxicities that include     hot flashes, breast tenderness, GI toxicity, liver function     abnormalities were discussed today.  He is agreeable to proceed     after we get his PSA, and I will discuss that with him after we get     the results. 2. Androgen deprivation.  He continues to be on Lupron under the care     of Dr. Isabel Caprice. 3. Bony disease.  His bone scan done in January 2013 was negative.  ______________________________ Benjiman Core, M.D. FNS/MEDQ  D:  05/09/2011  T:  05/09/2011  Job:  [id number]

## 2011-05-09 NOTE — Progress Notes (Signed)
Note Dictated

## 2011-05-11 ENCOUNTER — Encounter: Payer: Self-pay | Admitting: Oncology

## 2011-05-11 NOTE — Progress Notes (Signed)
Patient received one prescription from [facility] on 05/10/11 $3.82,his remaninig balance [facility] $396.18.

## 2011-06-05 ENCOUNTER — Encounter: Payer: Self-pay | Admitting: Oncology

## 2011-06-05 NOTE — Progress Notes (Signed)
Patient received one prescription from [facility] on 06/04/11 $3.82,his remaninig balance [facility] $392.36.

## 2011-06-27 NOTE — Progress Notes (Signed)
Received office notes from Dr. David Grapey @ [facility]; forwarded to Dr. Shadad. 

## 2011-07-02 ENCOUNTER — Other Ambulatory Visit: Payer: Self-pay | Admitting: Oncology

## 2011-07-04 ENCOUNTER — Encounter: Payer: Self-pay | Admitting: Oncology

## 2011-07-04 NOTE — Progress Notes (Signed)
Patient received one prescription from [facility] on 07/03/11 $6.13,his remaninig balance [facility] $386.23.

## 2011-07-17 ENCOUNTER — Ambulatory Visit (HOSPITAL_BASED_OUTPATIENT_CLINIC_OR_DEPARTMENT_OTHER): Payer: Self-pay | Admitting: Oncology

## 2011-07-17 ENCOUNTER — Other Ambulatory Visit (HOSPITAL_BASED_OUTPATIENT_CLINIC_OR_DEPARTMENT_OTHER): Payer: Self-pay | Admitting: Lab

## 2011-07-17 ENCOUNTER — Telehealth: Payer: Self-pay | Admitting: Oncology

## 2011-07-17 VITALS — BP 124/79 | HR 63 | Temp 98.4°F | Ht 74.4 in | Wt 229.8 lb

## 2011-07-17 DIAGNOSIS — C61 Malignant neoplasm of prostate: Secondary | ICD-10-CM

## 2011-07-17 LAB — COMPREHENSIVE METABOLIC PANEL
ALT: 12 U/L (ref 0–53)
Albumin: 3.9 g/dL (ref 3.5–5.2)
BUN: 17 mg/dL (ref 6–23)
CO2: 28 mEq/L (ref 19–32)
Calcium: 9.3 mg/dL (ref 8.4–10.5)
Chloride: 105 mEq/L (ref 96–112)
Creatinine, Ser: 1 mg/dL (ref 0.50–1.35)

## 2011-07-17 LAB — CBC WITH DIFFERENTIAL/PLATELET
Eosinophils Absolute: 0.3 10*3/uL (ref 0.0–0.5)
HCT: 40.4 % (ref 38.4–49.9)
HGB: 13.7 g/dL (ref 13.0–17.1)
LYMPH%: 25.8 % (ref 14.0–49.0)
MONO#: 0.5 10*3/uL (ref 0.1–0.9)
NEUT#: 4 10*3/uL (ref 1.5–6.5)
NEUT%: 62.2 % (ref 39.0–75.0)
Platelets: 217 10*3/uL (ref 140–400)
WBC: 6.4 10*3/uL (ref 4.0–10.3)

## 2011-07-17 LAB — PSA: PSA: 2.88 ng/mL (ref <=4.00)

## 2011-07-17 NOTE — Progress Notes (Signed)
Hematology and Oncology Follow Up Visit  [name] [id number] 12/08/1945 66 y.o. 07/17/2011 9:32 AM   Principle Diagnosis: 66 year old gentleman with prostate cancer diagnosed in 2003. He had a Gleason score 4 + 3 equals 7. PSA 6.45.   Prior Therapy:  1. He is status post a prostatectomy and lymph node dissection done on November 05, 2001. He had a Gleason score 4 + 3 equals 7. He had  focal extracapsular extension, both seminal vesicles were involved indicating his stage was T3b N0.  2. The patient received a total of 6480 cGy of radiation therapy in 36 fractions between February and April 2005.  3. The patient had a rise in August of 2011 up to 35 and he was started on Lupron. His PSA nadir down to 4.11. Most recently his PSA was up to 5.91 in December of 2012 with a testosterone level of 35. His staging workup did not reveal any bony metastasis. His his PSA went up to 10 in 04/2011.   Current therapy: Casodex was added to Lupron on 05/2011.  Interim History: [name] presents today for a followup visit. He is a pleasant gentleman who presents today after I saw him  initially back in January of 2013. He is reporting no new symptoms. He have tolerated Casodex well without complications. He is not reporting any abdominal pain. He has not reported any genitourinary complaints. He does not report any complication related to Lupron such as hot flashes or breast tenderness.  He does not report any back pain. He has not reported any shoulder pain. Performance status and activity level remains unchanged.    Medications: I have reviewed the patient's current medications. Current outpatient prescriptions:bicalutamide (CASODEX) 50 MG tablet, TAKE 1 TABLET BY MOUTH ONCE DAILY, Disp: 30 tablet, Rfl: 1;  Leuprolide Acetate (LUPRON DEPOT IM), Inject into the muscle every 4 (four) months., Disp: , Rfl: ;  warfarin (COUMADIN) 5 MG tablet, Take 5 mg by mouth daily. 7.5 mg or 10 mg, Disp: , Rfl:    Allergies:  Allergies  Allergen Reactions  . Penicillins     Past Medical History, Surgical history, Social history, and Family History were reviewed and updated.  Review of Systems: Constitutional:  Negative for fever, chills, night sweats, anorexia, weight loss, pain. Cardiovascular: no chest pain or dyspnea on exertion Respiratory: no cough, shortness of breath, or wheezing Neurological: no TIA or stroke symptoms Dermatological: negative ENT: negative Skin: Negative. Gastrointestinal: no abdominal pain, change in bowel habits, or black or bloody stools Genito-Urinary: negative Hematological and Lymphatic: negative Breast: negative Musculoskeletal: negative Remaining ROS negative. Physical Exam: Blood pressure 124/79, pulse 63, temperature 98.4 F (36.9 C), height 6' 2.4" (1.89 m), weight 229 lb 12.8 oz (104.237 kg). ECOG: 1 General appearance: alert Head: Normocephalic, without obvious abnormality, atraumatic Neck: no adenopathy, no carotid bruit, no JVD, supple, symmetrical, trachea midline and thyroid not enlarged, symmetric, no tenderness/mass/nodules Lymph nodes: Cervical, supraclavicular, and axillary nodes normal. Heart:regular rate and rhythm, S1, S2 normal, no murmur, click, rub or gallop Lung:chest clear, no wheezing, rales, normal symmetric air entry Abdomin: soft, non-tender, without masses or organomegaly EXT:no erythema, induration, or nodules   Lab Results: Lab Results  Component Value Date   WBC 6.4 07/17/2011   HGB 13.7 07/17/2011   HCT 40.4 07/17/2011   MCV 90.7 07/17/2011   PLT 217 07/17/2011     Chemistry      Component Value Date/Time   NA 139 05/09/2011 0951   K 4.5  05/09/2011 0951   CL 103 05/09/2011 0951   CO2 26 05/09/2011 0951   BUN 16 05/09/2011 0951   CREATININE 1.02 05/09/2011 0951      Component Value Date/Time   CALCIUM 9.2 05/09/2011 0951   ALKPHOS 44 05/09/2011 0951   AST 17 05/09/2011 0951   ALT 15 05/09/2011 0951   BILITOT 0.5  05/09/2011 0951       Impression and Plan: 1. This is a pleasant 67 year old gentleman with prostate cancer. 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 He is doing well os Casodex. His PSA is pending from today.   2. Androgen deprivation. He continues to be on Lupron under the care of Dr. Isabel Caprice.   3. Bony disease. His bone scan done in January 2013 was negative.  4. Follow up: in 2 months.     [facility], MD 5/21/20139:32 AM

## 2011-07-17 NOTE — Telephone Encounter (Signed)
appts made and printed for  Pt aom °

## 2011-08-07 ENCOUNTER — Encounter: Payer: Self-pay | Admitting: Oncology

## 2011-08-07 NOTE — Progress Notes (Signed)
Patient received one prescription from [place] [facility] on 08/06/11 $6.13,his remaninig balance [facility] $380.10.

## 2011-09-18 ENCOUNTER — Other Ambulatory Visit (HOSPITAL_BASED_OUTPATIENT_CLINIC_OR_DEPARTMENT_OTHER): Payer: Self-pay | Admitting: Lab

## 2011-09-18 ENCOUNTER — Ambulatory Visit (HOSPITAL_BASED_OUTPATIENT_CLINIC_OR_DEPARTMENT_OTHER): Payer: Self-pay | Admitting: Oncology

## 2011-09-18 ENCOUNTER — Telehealth: Payer: Self-pay | Admitting: Oncology

## 2011-09-18 VITALS — BP 128/76 | HR 62 | Temp 97.5°F | Wt 232.0 lb

## 2011-09-18 DIAGNOSIS — C61 Malignant neoplasm of prostate: Secondary | ICD-10-CM

## 2011-09-18 LAB — COMPREHENSIVE METABOLIC PANEL
ALT: 25 U/L (ref 0–53)
AST: 23 U/L (ref 0–37)
Alkaline Phosphatase: 41 U/L (ref 39–117)
BUN: 14 mg/dL (ref 6–23)
Calcium: 9.1 mg/dL (ref 8.4–10.5)
Creatinine, Ser: 0.93 mg/dL (ref 0.50–1.35)
Total Bilirubin: 0.5 mg/dL (ref 0.3–1.2)

## 2011-09-18 LAB — CBC WITH DIFFERENTIAL/PLATELET
BASO%: 0.5 % (ref 0.0–2.0)
Basophils Absolute: 0 10*3/uL (ref 0.0–0.1)
EOS%: 3.8 % (ref 0.0–7.0)
HCT: 40.8 % (ref 38.4–49.9)
HGB: 13.8 g/dL (ref 13.0–17.1)
LYMPH%: 27.8 % (ref 14.0–49.0)
MCH: 31 pg (ref 27.2–33.4)
MCHC: 33.9 g/dL (ref 32.0–36.0)
MCV: 91.3 fL (ref 79.3–98.0)
MONO%: 8.3 % (ref 0.0–14.0)
NEUT%: 59.6 % (ref 39.0–75.0)

## 2011-09-18 NOTE — Progress Notes (Signed)
Hematology and Oncology Follow Up Visit  [name] [id number] 1945/07/27 66 y.o. 09/18/2011 9:52 AM   Principle Diagnosis: 66 year old gentleman with prostate cancer diagnosed in 2003. He had a Gleason score 4 + 3 equals 7. PSA 6.45.   Prior Therapy:  1. He is status post a prostatectomy and lymph node dissection done on November 05, 2001. He had a Gleason score 4 + 3 equals 7. He had  focal extracapsular extension, both seminal vesicles were involved indicating his stage was T3b N0.  2. The patient received a total of 6480 cGy of radiation therapy in 36 fractions between February and April 2005.  3. The patient had a rise in August of 2011 up to 35 and he was started on Lupron. His PSA nadir down to 4.11. Most recently his PSA was up to 5.91 in December of 2012 with a testosterone level of 35. His staging workup did not reveal any bony metastasis. His his PSA went up to 10 in 04/2011.   Current therapy: Casodex was added to Lupron on 05/2011.  Interim History: [name] presents today for a followup visit. He is a pleasant gentleman who presents today for a follow up visit. He is reporting no new symptoms. He have tolerated Casodex well without complications. He is not reporting any abdominal pain. He has not reported any genitourinary complaints. He does report minor complication related to Lupron such as hot flashes or breast tenderness.  He does not report any back pain. He has not reported any shoulder pain. Performance status and activity level remains unchanged. He continued to perform regularly with out decline.    Medications: I have reviewed the patient's current medications. Current outpatient prescriptions:bicalutamide (CASODEX) 50 MG tablet, TAKE 1 TABLET BY MOUTH ONCE DAILY, Disp: 30 tablet, Rfl: 1;  Leuprolide Acetate (LUPRON DEPOT IM), Inject into the muscle every 4 (four) months., Disp: , Rfl: ;  warfarin (COUMADIN) 5 MG tablet, Take 5 mg by mouth daily. 7.5 mg or 10  mg, Disp: , Rfl:   Allergies:  Allergies  Allergen Reactions  . Penicillins     Past Medical History, Surgical history, Social history, and Family History were reviewed and updated.  Review of Systems: Constitutional:  Negative for fever, chills, night sweats, anorexia, weight loss, pain. Cardiovascular: no chest pain or dyspnea on exertion Respiratory: no cough, shortness of breath, or wheezing Neurological: no TIA or stroke symptoms Dermatological: negative ENT: negative Skin: Negative. Gastrointestinal: no abdominal pain, change in bowel habits, or black or bloody stools Genito-Urinary: negative Hematological and Lymphatic: negative Breast: negative Musculoskeletal: negative Remaining ROS negative. Physical Exam: Blood pressure 128/76, pulse 62, temperature 97.5 F (36.4 C), temperature source Oral, weight 232 lb (105.235 kg). ECOG: 1 General appearance: alert Head: Normocephalic, without obvious abnormality, atraumatic Neck: no adenopathy, no carotid bruit, no JVD, supple, symmetrical, trachea midline and thyroid not enlarged, symmetric, no tenderness/mass/nodules Lymph nodes: Cervical, supraclavicular, and axillary nodes normal. Heart:regular rate and rhythm, S1, S2 normal, no murmur, click, rub or gallop Lung:chest clear, no wheezing, rales, normal symmetric air entry Abdomin: soft, non-tender, without masses or organomegaly EXT:no erythema, induration, or nodules   Lab Results: Lab Results  Component Value Date   WBC 5.8 09/18/2011   HGB 13.8 09/18/2011   HCT 40.8 09/18/2011   MCV 91.3 09/18/2011   PLT 207 09/18/2011     Chemistry      Component Value Date/Time   NA 140 07/17/2011 0850   K 4.6 07/17/2011 0850  CL 105 07/17/2011 0850   CO2 28 07/17/2011 0850   BUN 17 07/17/2011 0850   CREATININE 1.00 07/17/2011 0850      Component Value Date/Time   CALCIUM 9.3 07/17/2011 0850   ALKPHOS 44 07/17/2011 0850   AST 17 07/17/2011 0850   ALT 12 07/17/2011 0850   BILITOT  0.6 07/17/2011 0850       Impression and Plan: 1. This is a pleasant 66 year old gentleman with prostate cancer. 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 He is doing well os Casodex. His PSA is dropped from  10.53 to 2.55 (06/2011). His PSA is pending from today.  The plan is to continue with casodex.   2. Androgen deprivation. He continues to be on Lupron under the care of Dr. Isabel Caprice.   3. Bony disease. His bone scan done in January 2013 was negative.  4. Follow up: in 2 months.     [facility], MD 7/23/20139:52 AM

## 2011-09-18 NOTE — Telephone Encounter (Signed)
Gave pt appt calendar for 12/11/11 per pt rqst lab and MD

## 2011-09-19 ENCOUNTER — Telehealth: Payer: Self-pay | Admitting: *Deleted

## 2011-09-19 NOTE — Telephone Encounter (Signed)
Gave patient PSA results drawn 09/18/2011.  Patient verbalizes understanding.

## 2011-09-20 ENCOUNTER — Encounter: Payer: Self-pay | Admitting: Oncology

## 2011-09-20 NOTE — Progress Notes (Signed)
Patient Overqualified for financial assistance for a family of two,income [phone].

## 2011-09-25 ENCOUNTER — Other Ambulatory Visit: Payer: Self-pay | Admitting: Oncology

## 2011-12-11 ENCOUNTER — Other Ambulatory Visit (HOSPITAL_BASED_OUTPATIENT_CLINIC_OR_DEPARTMENT_OTHER): Payer: Self-pay | Admitting: Lab

## 2011-12-11 ENCOUNTER — Telehealth: Payer: Self-pay | Admitting: Oncology

## 2011-12-11 ENCOUNTER — Ambulatory Visit (HOSPITAL_BASED_OUTPATIENT_CLINIC_OR_DEPARTMENT_OTHER): Payer: Self-pay | Admitting: Oncology

## 2011-12-11 VITALS — BP 131/84 | HR 60 | Temp 97.0°F | Resp 20 | Ht 74.4 in | Wt 230.6 lb

## 2011-12-11 DIAGNOSIS — C61 Malignant neoplasm of prostate: Secondary | ICD-10-CM

## 2011-12-11 DIAGNOSIS — C7952 Secondary malignant neoplasm of bone marrow: Secondary | ICD-10-CM

## 2011-12-11 LAB — COMPREHENSIVE METABOLIC PANEL (CC13)
AST: 22 U/L (ref 5–34)
Albumin: 3.7 g/dL (ref 3.5–5.0)
Alkaline Phosphatase: 48 U/L (ref 40–150)
Potassium: 4.2 mEq/L (ref 3.5–5.1)
Sodium: 141 mEq/L (ref 136–145)
Total Bilirubin: 0.7 mg/dL (ref 0.20–1.20)
Total Protein: 6.8 g/dL (ref 6.4–8.3)

## 2011-12-11 LAB — CBC WITH DIFFERENTIAL/PLATELET
Basophils Absolute: 0 10*3/uL (ref 0.0–0.1)
Eosinophils Absolute: 0.2 10*3/uL (ref 0.0–0.5)
HCT: 40.7 % (ref 38.4–49.9)
HGB: 13.9 g/dL (ref 13.0–17.1)
MCV: 91.5 fL (ref 79.3–98.0)
MONO%: 7.6 % (ref 0.0–14.0)
NEUT#: 3 10*3/uL (ref 1.5–6.5)
NEUT%: 59.7 % (ref 39.0–75.0)
Platelets: 209 10*3/uL (ref 140–400)
RDW: 13.2 % (ref 11.0–14.6)

## 2011-12-11 LAB — PSA: PSA: 1.8 ng/mL (ref <=4.00)

## 2011-12-11 NOTE — Progress Notes (Signed)
Hematology and Oncology Follow Up Visit  [name] [id number] 01/27/1946 66 y.o. 12/11/2011 9:16 AM   Principle Diagnosis: 66 year old gentleman with prostate cancer diagnosed in 2003. He had a Gleason score 4 + 3 equals 7. PSA 6.45.   Prior Therapy:  1. He is status post a prostatectomy and lymph node dissection done on November 05, 2001. He had a Gleason score 4 + 3 equals 7. He had  focal extracapsular extension, both seminal vesicles were involved indicating his stage was T3b N0.  2. The patient received a total of 6480 cGy of radiation therapy in 36 fractions between February and April 2005.  3. The patient had a rise in August of 2011 up to 35 and he was started on Lupron. His PSA nadir down to 4.11. Most recently his PSA was up to 5.91 in December of 2012 with a testosterone level of 35. His staging workup did not reveal any bony metastasis. His his PSA went up to 10 in 04/2011.   Current therapy: Casodex was added to Lupron on 05/2011.  Interim History: [name] presents today for a followup visit. He is a pleasant gentleman who presents today for a follow up visit. He is reporting no new symptoms. He have tolerated Casodex well without complications. He is not reporting any abdominal pain. He has not reported any genitourinary complaints. He does report minor complication related to Lupron such as hot flashes and breast tenderness but both have improved some.  He does not report any back pain. He has not reported any shoulder pain. Performance status and activity level remains unchanged. He continued to perform regularly with out decline.    Medications: I have reviewed the patient's current medications. Current outpatient prescriptions:bicalutamide (CASODEX) 50 MG tablet, TAKE 1 TABLET BY MOUTH ONCE DAILY, Disp: 30 tablet, Rfl: 2;  Leuprolide Acetate (LUPRON DEPOT IM), Inject into the muscle every 4 (four) months., Disp: , Rfl: ;  warfarin (COUMADIN) 5 MG tablet, Take 5 mg  by mouth daily. 7.5 mg or 10 mg, Disp: , Rfl:   Allergies:  Allergies  Allergen Reactions  . Penicillins     Past Medical History, Surgical history, Social history, and Family History were reviewed and updated.  Review of Systems: Constitutional:  Negative for fever, chills, night sweats, anorexia, weight loss, pain. Cardiovascular: no chest pain or dyspnea on exertion Respiratory: no cough, shortness of breath, or wheezing Neurological: no TIA or stroke symptoms Dermatological: negative ENT: negative Skin: Negative. Gastrointestinal: no abdominal pain, change in bowel habits, or black or bloody stools Genito-Urinary: negative Hematological and Lymphatic: negative Breast: negative Musculoskeletal: negative Remaining ROS negative. Physical Exam: Blood pressure 131/84, pulse 60, temperature 97 F (36.1 C), temperature source Oral, resp. rate 20, height 6' 2.4" (1.89 m), weight 230 lb 9.6 oz (104.599 kg). ECOG: 1 General appearance: alert Head: Normocephalic, without obvious abnormality, atraumatic Neck: no adenopathy, no carotid bruit, no JVD, supple, symmetrical, trachea midline and thyroid not enlarged, symmetric, no tenderness/mass/nodules Lymph nodes: Cervical, supraclavicular, and axillary nodes normal. Heart:regular rate and rhythm, S1, S2 normal, no murmur, click, rub or gallop Lung:chest clear, no wheezing, rales, normal symmetric air entry Abdomin: soft, non-tender, without masses or organomegaly EXT:no erythema, induration, or nodules   Lab Results: Lab Results  Component Value Date   WBC 5.1 12/11/2011   HGB 13.9 12/11/2011   HCT 40.7 12/11/2011   MCV 91.5 12/11/2011   PLT 209 12/11/2011     Chemistry      Component  Value Date/Time   NA 140 09/18/2011 0915   K 4.7 09/18/2011 0915   CL 104 09/18/2011 0915   CO2 31 09/18/2011 0915   BUN 14 09/18/2011 0915   CREATININE 0.93 09/18/2011 0915      Component Value Date/Time   CALCIUM 9.1 09/18/2011 0915   ALKPHOS  41 09/18/2011 0915   AST 23 09/18/2011 0915   ALT 25 09/18/2011 0915   BILITOT 0.5 09/18/2011 0915     Results for Shipton, [name] (MRN [id number]) as of 12/11/2011 08:49  Ref. Range 05/09/2011 09:51 07/17/2011 08:50 09/18/2011 09:15  PSA Latest Range: <=4.00 ng/mL 10.53 (H) 2.88 2.04    Impression and Plan: 1. This is a pleasant 66 year old gentleman with prostate cancer. 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 He is doing well os Casodex. His PSA is dropped from  10.53 to 2.04 (08/2011). His PSA is pending from today.  The plan is to continue with casodex as he tolerated it very well without complications.   2. Androgen deprivation. He continues to be on Lupron under the care of Dr. Isabel Caprice.   3. Bony disease. His bone scan done in January 2013 was negative.  4. Follow up: in 3 months.     [facility], MD 10/15/20139:16 AM

## 2011-12-11 NOTE — Telephone Encounter (Signed)
appts made and printed for pt aom °

## 2012-01-28 ENCOUNTER — Other Ambulatory Visit: Payer: Self-pay | Admitting: Oncology

## 2012-02-29 NOTE — Progress Notes (Signed)
Received office notes from Dr. Barron Alvine @ [facility]; forwarded to Dr. Clelia Croft.

## 2012-03-11 ENCOUNTER — Ambulatory Visit (HOSPITAL_BASED_OUTPATIENT_CLINIC_OR_DEPARTMENT_OTHER): Payer: Self-pay | Admitting: Oncology

## 2012-03-11 ENCOUNTER — Other Ambulatory Visit (HOSPITAL_BASED_OUTPATIENT_CLINIC_OR_DEPARTMENT_OTHER): Payer: Self-pay | Admitting: Lab

## 2012-03-11 ENCOUNTER — Telehealth: Payer: Self-pay | Admitting: Oncology

## 2012-03-11 VITALS — BP 135/81 | HR 65 | Temp 97.0°F | Resp 20 | Ht 74.0 in | Wt 231.8 lb

## 2012-03-11 DIAGNOSIS — C61 Malignant neoplasm of prostate: Secondary | ICD-10-CM

## 2012-03-11 LAB — CBC WITH DIFFERENTIAL/PLATELET
Basophils Absolute: 0 10*3/uL (ref 0.0–0.1)
EOS%: 3 % (ref 0.0–7.0)
Eosinophils Absolute: 0.2 10*3/uL (ref 0.0–0.5)
LYMPH%: 29.5 % (ref 14.0–49.0)
MCH: 30.9 pg (ref 27.2–33.4)
MCV: 89.6 fL (ref 79.3–98.0)
MONO%: 8.1 % (ref 0.0–14.0)
NEUT#: 3.1 10*3/uL (ref 1.5–6.5)
Platelets: 210 10*3/uL (ref 140–400)
RBC: 4.58 10*6/uL (ref 4.20–5.82)
RDW: 13 % (ref 11.0–14.6)

## 2012-03-11 LAB — COMPREHENSIVE METABOLIC PANEL (CC13)
AST: 19 U/L (ref 5–34)
Alkaline Phosphatase: 47 U/L (ref 40–150)
BUN: 18 mg/dL (ref 7.0–26.0)
Glucose: 104 mg/dl — ABNORMAL HIGH (ref 70–99)
Potassium: 4.7 mEq/L (ref 3.5–5.1)
Sodium: 140 mEq/L (ref 136–145)
Total Bilirubin: 0.48 mg/dL (ref 0.20–1.20)

## 2012-03-11 NOTE — Progress Notes (Signed)
Hematology and Oncology Follow Up Visit  BASCOM BIEL [id number] August 06, 1945 67 y.o. 03/11/2012 9:42 AM   Principle Diagnosis: 67 year old gentleman with prostate cancer diagnosed in 2003. He had a Gleason score 4 + 3 equals 7. PSA 6.45.   Prior Therapy:  1. He is status post a prostatectomy and lymph node dissection done on November 05, 2001. He had a Gleason score 4 + 3 equals 7. He had  focal extracapsular extension, both seminal vesicles were involved indicating his stage was T3b N0.  2. The patient received a total of 6480 cGy of radiation therapy in 36 fractions between February and April 2005.  3. The patient had a rise in August of 2011 up to 35 and he was started on Lupron. His PSA nadir down to 4.11. Most recently his PSA was up to 5.91 in December of 2012 with a testosterone level of 35. His staging workup did not reveal any bony metastasis. His his PSA went up to 10 in 04/2011.   Current therapy: Casodex was added to Lupron on 05/2011.  Interim History: Mr. Sinning presents today for a followup visit. He is a pleasant gentleman who presents today for a follow up visit. He is reporting no new symptoms. He have tolerated Casodex well without complications. He is not reporting any abdominal pain. He has not reported any genitourinary complaints. He does report minor complication related to Lupron such as hot flashes and breast tenderness but both have improved some.  He does not report any back pain. He has not reported any shoulder pain. Performance status and activity level remains unchanged. He continued to perform regularly with out decline. He is performing without any decline.    Medications: I have reviewed the patient's current medications. Current outpatient prescriptions:bicalutamide (CASODEX) 50 MG tablet, TAKE 1 TABLET BY MOUTH ONCE DAILY, Disp: 30 tablet, Rfl: 3;  Leuprolide Acetate (LUPRON DEPOT IM), Inject into the muscle every 4 (four) months., Disp: , Rfl: ;   warfarin (COUMADIN) 5 MG tablet, Take 5 mg by mouth daily. 7.5 mg or 10 mg, Disp: , Rfl:   Allergies:  Allergies  Allergen Reactions  . Penicillins     Past Medical History, Surgical history, Social history, and Family History were reviewed and updated.  Review of Systems: Constitutional:  Negative for fever, chills, night sweats, anorexia, weight loss, pain. Cardiovascular: no chest pain or dyspnea on exertion Respiratory: no cough, shortness of breath, or wheezing Neurological: no TIA or stroke symptoms Dermatological: negative ENT: negative Skin: Negative. Gastrointestinal: no abdominal pain, change in bowel habits, or black or bloody stools Genito-Urinary: negative Hematological and Lymphatic: negative Breast: negative Musculoskeletal: negative Remaining ROS negative. Physical Exam: Blood pressure 135/81, pulse 65, temperature 97 F (36.1 C), temperature source Oral, resp. rate 20, height 6\' 2"  (1.88 m), weight 231 lb 12.8 oz (105.144 kg). ECOG: 1 General appearance: alert Head: Normocephalic, without obvious abnormality, atraumatic Neck: no adenopathy, no carotid bruit, no JVD, supple, symmetrical, trachea midline and thyroid not enlarged, symmetric, no tenderness/mass/nodules Lymph nodes: Cervical, supraclavicular, and axillary nodes normal. Heart:regular rate and rhythm, S1, S2 normal, no murmur, click, rub or gallop Lung:chest clear, no wheezing, rales, normal symmetric air entry Abdomin: soft, non-tender, without masses or organomegaly EXT:no erythema, induration, or nodules   Lab Results: Lab Results  Component Value Date   WBC 5.2 03/11/2012   HGB 14.1 03/11/2012   HCT 41.0 03/11/2012   MCV 89.6 03/11/2012   PLT 210 03/11/2012     Chemistry  Component Value Date/Time   NA 141 12/11/2011 0844   NA 140 09/18/2011 0915   K 4.2 12/11/2011 0844   K 4.7 09/18/2011 0915   CL 105 12/11/2011 0844   CL 104 09/18/2011 0915   CO2 27 12/11/2011 0844   CO2 31  09/18/2011 0915   BUN 14.0 12/11/2011 0844   BUN 14 09/18/2011 0915   CREATININE 1.0 12/11/2011 0844   CREATININE 0.93 09/18/2011 0915      Component Value Date/Time   CALCIUM 9.5 12/11/2011 0844   CALCIUM 9.1 09/18/2011 0915   ALKPHOS 48 12/11/2011 0844   ALKPHOS 41 09/18/2011 0915   AST 22 12/11/2011 0844   AST 23 09/18/2011 0915   ALT 25 12/11/2011 0844   ALT 25 09/18/2011 0915   BILITOT 0.70 12/11/2011 0844   BILITOT 0.5 09/18/2011 0915     Results for Class, ANTAVIUS SPERBECK (MRN [id number]) as of 03/11/2012 09:09  Ref. Range 09/18/2011 09:15 12/11/2011 08:44  PSA Latest Range: <=4.00 ng/mL 2.04 1.80     Impression and Plan: 1. This is a pleasant 67 year old gentleman with prostate cancer. 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 He is doing well os Casodex. His PSA is dropped from  10.53 to 1.8. His PSA is pending from today.  The plan is to continue with casodex as he tolerated it very well without complications.   2. Androgen deprivation. He continues to be on Lupron under the care of Dr. Isabel Caprice.   3. Bony disease. His bone scan done in January 2013 was negative.  4. Follow up: in 3 months.     [facility], MD 1/14/20149:42 AM

## 2012-03-11 NOTE — Telephone Encounter (Signed)
appts made and printed for pt aom °

## 2012-03-12 ENCOUNTER — Telehealth: Payer: Self-pay | Admitting: *Deleted

## 2012-03-12 NOTE — Telephone Encounter (Signed)
Gave patient results of PSA drawn yesterday.

## 2012-03-12 NOTE — Telephone Encounter (Signed)
Message copied by Reesa Chew on Wed Mar 12, 2012 10:05 AM ------      Message from: Benjiman Core      Created: Tue Mar 11, 2012  4:32 PM       Please call his PSA. Stable.

## 2012-05-27 ENCOUNTER — Other Ambulatory Visit: Payer: Self-pay | Admitting: Oncology

## 2012-06-11 ENCOUNTER — Ambulatory Visit (HOSPITAL_BASED_OUTPATIENT_CLINIC_OR_DEPARTMENT_OTHER): Payer: Medicare Other | Admitting: Oncology

## 2012-06-11 ENCOUNTER — Telehealth: Payer: Self-pay | Admitting: Oncology

## 2012-06-11 ENCOUNTER — Other Ambulatory Visit (HOSPITAL_BASED_OUTPATIENT_CLINIC_OR_DEPARTMENT_OTHER): Payer: Medicare Other | Admitting: Lab

## 2012-06-11 VITALS — BP 125/79 | HR 59 | Temp 97.0°F | Resp 20 | Ht 74.0 in | Wt 231.9 lb

## 2012-06-11 DIAGNOSIS — M949 Disorder of cartilage, unspecified: Secondary | ICD-10-CM

## 2012-06-11 DIAGNOSIS — C61 Malignant neoplasm of prostate: Secondary | ICD-10-CM

## 2012-06-11 LAB — CBC WITH DIFFERENTIAL/PLATELET
BASO%: 0.8 % (ref 0.0–2.0)
EOS%: 4.5 % (ref 0.0–7.0)
Eosinophils Absolute: 0.3 10*3/uL (ref 0.0–0.5)
LYMPH%: 28.2 % (ref 14.0–49.0)
MCH: 30.8 pg (ref 27.2–33.4)
MCHC: 34 g/dL (ref 32.0–36.0)
MCV: 90.4 fL (ref 79.3–98.0)
MONO%: 7.1 % (ref 0.0–14.0)
NEUT#: 3.6 10*3/uL (ref 1.5–6.5)
Platelets: 212 10*3/uL (ref 140–400)
RBC: 4.57 10*6/uL (ref 4.20–5.82)
RDW: 13.3 % (ref 11.0–14.6)

## 2012-06-11 LAB — COMPREHENSIVE METABOLIC PANEL (CC13)
ALT: 17 U/L (ref 0–55)
AST: 19 U/L (ref 5–34)
Albumin: 3.4 g/dL — ABNORMAL LOW (ref 3.5–5.0)
Alkaline Phosphatase: 51 U/L (ref 40–150)
Potassium: 4.1 mEq/L (ref 3.5–5.1)
Sodium: 141 mEq/L (ref 136–145)
Total Bilirubin: 0.63 mg/dL (ref 0.20–1.20)
Total Protein: 7.4 g/dL (ref 6.4–8.3)

## 2012-06-11 NOTE — Progress Notes (Signed)
Hematology and Oncology Follow Up Visit  [name] [id number] 1945/04/29 67 y.o. 06/11/2012 8:58 AM   Principle Diagnosis: 67 year old gentleman with prostate cancer diagnosed in 2003. He had a Gleason score 4 + 3 equals 7. PSA 6.45.   Prior Therapy:  1. He is status post a prostatectomy and lymph node dissection done on November 05, 2001. He had a Gleason score 4 + 3 equals 7. He had  focal extracapsular extension, both seminal vesicles were involved indicating his stage was T3b N0.  2. The patient received a total of 6480 cGy of radiation therapy in 36 fractions between February and April 2005.  3. The patient had a rise in August of 2011 up to 35 and he was started on Lupron. His PSA nadir down to 4.11. Most recently his PSA was up to 5.91 in December of 2012 with a testosterone level of 35. His staging workup did not reveal any bony metastasis. His his PSA went up to 10 in 04/2011.   Current therapy: Casodex was added to Lupron on 05/2011.  Interim History: Mr. Barrows presents today for a followup visit. He is a pleasant gentleman who presents today for a follow up visit. He is reporting no new symptoms. He have tolerated Casodex well without complications. He is not reporting any abdominal pain. He has not reported any genitourinary complaints. He does report minor complication related to Lupron such as hot flashes and breast tenderness but both have improved some.  He does not report any back pain. He has not reported any shoulder pain. Performance status and activity level remains unchanged. He continued to perform regularly with out decline. No recent illnesses or hospitalizations.    Medications: I have reviewed the patient's current medications. Current outpatient prescriptions:ALPRAZolam (XANAX) 0.5 MG tablet, Take 0.5 mg by mouth 3 (three) times daily as needed for sleep., Disp: , Rfl: ;  bicalutamide (CASODEX) 50 MG tablet, TAKE 1 TABLET BY MOUTH ONCE DAILY, Disp: 30  tablet, Rfl: 3;  Leuprolide Acetate (LUPRON DEPOT IM), Inject into the muscle every 4 (four) months., Disp: , Rfl: ;  warfarin (COUMADIN) 5 MG tablet, Take 5 mg by mouth daily. 7.5 mg or 10 mg, Disp: , Rfl:   Allergies:  Allergies  Allergen Reactions  . Penicillins     Past Medical History, Surgical history, Social history, and Family History were reviewed and updated.  Review of Systems: Constitutional:  Negative for fever, chills, night sweats, anorexia, weight loss, pain. Cardiovascular: no chest pain or dyspnea on exertion Respiratory: no cough, shortness of breath, or wheezing Neurological: no TIA or stroke symptoms Dermatological: negative ENT: negative Skin: Negative. Gastrointestinal: no abdominal pain, change in bowel habits, or black or bloody stools Genito-Urinary: negative Hematological and Lymphatic: negative Breast: negative Musculoskeletal: negative Remaining ROS negative. Physical Exam: Blood pressure 125/79, pulse 59, temperature 97 F (36.1 C), temperature source Oral, resp. rate 20, height 6\' 2"  (1.88 m), weight 231 lb 14.4 oz (105.189 kg). ECOG: 1 General appearance: alert Head: Normocephalic, without obvious abnormality, atraumatic Neck: no adenopathy, no carotid bruit, no JVD, supple, symmetrical, trachea midline and thyroid not enlarged, symmetric, no tenderness/mass/nodules Lymph nodes: Cervical, supraclavicular, and axillary nodes normal. Heart:regular rate and rhythm, S1, S2 normal, no murmur, click, rub or gallop Lung:chest clear, no wheezing, rales, normal symmetric air entry Abdomin: soft, non-tender, without masses or organomegaly EXT:no erythema, induration, or nodules   Lab Results: Lab Results  Component Value Date   WBC 6.0 06/11/2012   HGB  14.1 06/11/2012   HCT 41.3 06/11/2012   MCV 90.4 06/11/2012   PLT 212 06/11/2012     Chemistry      Component Value Date/Time   NA 141 06/11/2012 0817   NA 140 09/18/2011 0915   K 4.1 06/11/2012 0817    K 4.7 09/18/2011 0915   CL 104 06/11/2012 0817   CL 104 09/18/2011 0915   CO2 29 06/11/2012 0817   CO2 31 09/18/2011 0915   BUN 16.6 06/11/2012 0817   BUN 14 09/18/2011 0915   CREATININE 1.0 06/11/2012 0817   CREATININE 0.93 09/18/2011 0915      Component Value Date/Time   CALCIUM 9.2 06/11/2012 0817   CALCIUM 9.1 09/18/2011 0915   ALKPHOS 51 06/11/2012 0817   ALKPHOS 41 09/18/2011 0915   AST 19 06/11/2012 0817   AST 23 09/18/2011 0915   ALT 17 06/11/2012 0817   ALT 25 09/18/2011 0915   BILITOT 0.63 06/11/2012 0817   BILITOT 0.5 09/18/2011 0915      Results for [name] (MRN [id number]) as of 06/11/2012 08:59  Ref. Range 09/18/2011 09:15 12/11/2011 08:44 03/11/2012 09:01  PSA Latest Range: <=4.00 ng/mL 2.04 1.80 1.87     Impression and Plan: 1. This is a pleasant 67 year old gentleman with prostate cancer. 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 He is doing well on Casodex. His PSA is dropped from  10.53 to 1.8. His PSA is pending from today.  The plan is to continue with casodex as he tolerated it very well without complications.   2. Androgen deprivation. He continues to be on Lupron under the care of Dr. Isabel Caprice.   3. Bony disease. His bone scan done in January 2013 was negative.  4. Follow up: in 3 months.     [facility], MD 4/16/20148:58 AM

## 2012-06-11 NOTE — Telephone Encounter (Signed)
gv and printed appt sched and avs for pt  °

## 2012-06-12 ENCOUNTER — Telehealth: Payer: Self-pay | Admitting: *Deleted

## 2012-06-12 NOTE — Telephone Encounter (Signed)
Gave patient results of PSA done yesterday. 

## 2012-06-12 NOTE — Telephone Encounter (Signed)
Message copied by Reesa Chew on Thu Jun 12, 2012 10:30 AM ------      Message from: Benjiman Core      Created: Thu Jun 12, 2012  9:51 AM       Please call his PSA. Stable. ------

## 2012-09-11 ENCOUNTER — Telehealth: Payer: Self-pay | Admitting: Oncology

## 2012-09-11 ENCOUNTER — Other Ambulatory Visit (HOSPITAL_BASED_OUTPATIENT_CLINIC_OR_DEPARTMENT_OTHER): Payer: Medicare Other

## 2012-09-11 ENCOUNTER — Ambulatory Visit (HOSPITAL_BASED_OUTPATIENT_CLINIC_OR_DEPARTMENT_OTHER): Payer: Medicare Other | Admitting: Oncology

## 2012-09-11 VITALS — BP 126/81 | HR 61 | Temp 97.7°F | Resp 18 | Ht 74.0 in | Wt 232.1 lb

## 2012-09-11 DIAGNOSIS — C61 Malignant neoplasm of prostate: Secondary | ICD-10-CM

## 2012-09-11 LAB — CBC WITH DIFFERENTIAL/PLATELET
BASO%: 0.7 % (ref 0.0–2.0)
HCT: 40.6 % (ref 38.4–49.9)
LYMPH%: 27.5 % (ref 14.0–49.0)
MCHC: 34.5 g/dL (ref 32.0–36.0)
MCV: 89.8 fL (ref 79.3–98.0)
MONO#: 0.4 10*3/uL (ref 0.1–0.9)
MONO%: 6.9 % (ref 0.0–14.0)
NEUT%: 60.5 % (ref 39.0–75.0)
Platelets: 212 10*3/uL (ref 140–400)
RBC: 4.52 10*6/uL (ref 4.20–5.82)
WBC: 6.2 10*3/uL (ref 4.0–10.3)

## 2012-09-11 LAB — COMPREHENSIVE METABOLIC PANEL (CC13)
AST: 20 U/L (ref 5–34)
Alkaline Phosphatase: 47 U/L (ref 40–150)
BUN: 17.8 mg/dL (ref 7.0–26.0)
Calcium: 9.4 mg/dL (ref 8.4–10.4)
Chloride: 103 mEq/L (ref 98–109)
Creatinine: 1 mg/dL (ref 0.7–1.3)
Glucose: 93 mg/dl (ref 70–140)

## 2012-09-11 NOTE — Telephone Encounter (Signed)
gv and printed appt sched and avs for pt  °

## 2012-09-11 NOTE — Progress Notes (Signed)
Hematology and Oncology Follow Up Visit  [name] [id number] 1946/02/03 67 y.o. 09/11/2012 8:43 AM   Principle Diagnosis: 67 year old gentleman with prostate cancer diagnosed in 2003. He had a Gleason score 4 + 3 equals 7. PSA 6.45.   Prior Therapy:  1. He is status post a prostatectomy and lymph node dissection done on November 05, 2001. He had a Gleason score 4 + 3 equals 7. He had  focal extracapsular extension, both seminal vesicles were involved indicating his stage was T3b N0.  2. The patient received a total of 6480 cGy of radiation therapy in 36 fractions between February and April 2005.  3. The patient had a rise in August of 2011 up to 35 and he was started on Lupron. His PSA nadir down to 4.11. Most recently his PSA was up to 5.91 in December of 2012 with a testosterone level of 35. His staging workup did not reveal any bony metastasis. His his PSA went up to 10 in 04/2011.   Current therapy: Casodex was added to Lupron on 05/2011.  Interim History: [name] presents today for a followup visit. He is a pleasant gentleman who presents today for a follow up visit. He is reporting no new symptoms. He have tolerated Casodex well without complications. He is not reporting any abdominal pain. He has not reported any genitourinary complaints. He does report minor complication related to Lupron such as hot flashes and breast tenderness but both have improved some.  He does not report any back pain. He has not reported any shoulder pain. Performance status and activity level remains unchanged. He continued to perform regularly with out decline. No recent illnesses or hospitalizations.  No GU complaints at this time.    Medications: I have reviewed the patient's current medications.  Current Outpatient Prescriptions  Medication Sig Dispense Refill  . ALPRAZolam (XANAX) 0.5 MG tablet Take 0.5 mg by mouth 3 (three) times daily as needed for sleep.      . bicalutamide (CASODEX) 50  MG tablet TAKE 1 TABLET BY MOUTH ONCE DAILY  30 tablet  3  . Leuprolide Acetate (LUPRON DEPOT IM) Inject into the muscle every 4 (four) months.      . warfarin (COUMADIN) 5 MG tablet Take 5 mg by mouth daily. 7.5 mg or 10 mg       No current facility-administered medications for this visit.    Allergies:  Allergies  Allergen Reactions  . Penicillins     Past Medical History, Surgical history, Social history, and Family History were reviewed and updated.  Review of Systems: Constitutional:  Negative for fever, chills, night sweats, anorexia, weight loss, pain. Cardiovascular: no chest pain or dyspnea on exertion Respiratory: no cough, shortness of breath, or wheezing Neurological: no TIA or stroke symptoms Dermatological: negative ENT: negative Skin: Negative. Gastrointestinal: no abdominal pain, change in bowel habits, or black or bloody stools Genito-Urinary: negative Hematological and Lymphatic: negative Breast: negative Musculoskeletal: negative Remaining ROS negative. Physical Exam: There were no vitals taken for this visit. ECOG: 1 General appearance: alert Head: Normocephalic, without obvious abnormality, atraumatic Neck: no adenopathy, no carotid bruit, no JVD, supple, symmetrical, trachea midline and thyroid not enlarged, symmetric, no tenderness/mass/nodules Lymph nodes: Cervical, supraclavicular, and axillary nodes normal. Heart:regular rate and rhythm, S1, S2 normal, no murmur, click, rub or gallop Lung:chest clear, no wheezing, rales, normal symmetric air entry Abdomin: soft, non-tender, without masses or organomegaly EXT:no erythema, induration, or nodules   Lab Results: Lab Results  Component  Value Date   WBC 6.2 09/11/2012   HGB 14.0 09/11/2012   HCT 40.6 09/11/2012   MCV 89.8 09/11/2012   PLT 212 09/11/2012     Chemistry      Component Value Date/Time   NA 141 06/11/2012 0817   NA 140 09/18/2011 0915   K 4.1 06/11/2012 0817   K 4.7 09/18/2011 0915   CL  104 06/11/2012 0817   CL 104 09/18/2011 0915   CO2 29 06/11/2012 0817   CO2 31 09/18/2011 0915   BUN 16.6 06/11/2012 0817   BUN 14 09/18/2011 0915   CREATININE 1.0 06/11/2012 0817   CREATININE 0.93 09/18/2011 0915      Component Value Date/Time   CALCIUM 9.2 06/11/2012 0817   CALCIUM 9.1 09/18/2011 0915   ALKPHOS 51 06/11/2012 0817   ALKPHOS 41 09/18/2011 0915   AST 19 06/11/2012 0817   AST 23 09/18/2011 0915   ALT 17 06/11/2012 0817   ALT 25 09/18/2011 0915   BILITOT 0.63 06/11/2012 0817   BILITOT 0.5 09/18/2011 0915      Results for [name] (MRN [id number]) as of 09/11/2012 08:28  Ref. Range 03/11/2012 09:01 06/11/2012 08:17  PSA Latest Range: <=4.00 ng/mL 1.87 1.96     Impression and Plan: 1. This is a pleasant 68 year old gentleman with prostate cancer. 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 He is doing well on Casodex. His PSA is dropped from  10.53 to 1.8. And now it is 1.96. The plan is to continue with casodex as he tolerated it very well without complications. If his PSA rises rapidly, we will stage him with a bone scan. He expressed some reservation about the cost of another bone scan and would like to defer that as much as possible.   2. Androgen deprivation. He continues to be on Lupron under the care of Dr. Isabel Caprice.   3. Bony disease. His bone scan done in January 2013 was negative.  4. Follow up: in 3 months.     [facility], MD 7/17/20148:43 AM

## 2012-09-12 ENCOUNTER — Telehealth: Payer: Self-pay | Admitting: *Deleted

## 2012-09-12 NOTE — Telephone Encounter (Signed)
Message copied by Reesa Chew on Fri Sep 12, 2012 10:56 AM ------      Message from: Benjiman Core      Created: Fri Sep 12, 2012  7:30 AM       Please call his PSA. Not changed much. Keep on the same medicine. ------

## 2012-09-12 NOTE — Telephone Encounter (Signed)
Gave patient results of PSA done yesterday. 

## 2012-09-22 ENCOUNTER — Other Ambulatory Visit: Payer: Self-pay | Admitting: Oncology

## 2012-12-09 ENCOUNTER — Ambulatory Visit (HOSPITAL_BASED_OUTPATIENT_CLINIC_OR_DEPARTMENT_OTHER): Payer: Medicare Other | Admitting: Oncology

## 2012-12-09 ENCOUNTER — Telehealth: Payer: Self-pay | Admitting: Oncology

## 2012-12-09 ENCOUNTER — Other Ambulatory Visit: Payer: Medicare Other | Admitting: Lab

## 2012-12-09 VITALS — BP 128/68 | HR 61 | Temp 98.0°F | Resp 20 | Ht 74.0 in | Wt 236.3 lb

## 2012-12-09 DIAGNOSIS — C61 Malignant neoplasm of prostate: Secondary | ICD-10-CM

## 2012-12-09 DIAGNOSIS — E291 Testicular hypofunction: Secondary | ICD-10-CM

## 2012-12-09 LAB — CBC WITH DIFFERENTIAL/PLATELET
Basophils Absolute: 0 10*3/uL (ref 0.0–0.1)
Eosinophils Absolute: 0.3 10*3/uL (ref 0.0–0.5)
HGB: 13.2 g/dL (ref 13.0–17.1)
MCV: 91 fL (ref 79.3–98.0)
MONO#: 0.4 10*3/uL (ref 0.1–0.9)
MONO%: 7.3 % (ref 0.0–14.0)
NEUT#: 3.3 10*3/uL (ref 1.5–6.5)
RBC: 4.34 10*6/uL (ref 4.20–5.82)
RDW: 13.3 % (ref 11.0–14.6)
WBC: 5.6 10*3/uL (ref 4.0–10.3)

## 2012-12-09 LAB — COMPREHENSIVE METABOLIC PANEL (CC13)
ALT: 16 U/L (ref 0–55)
AST: 21 U/L (ref 5–34)
Anion Gap: 9 mEq/L (ref 3–11)
BUN: 19.3 mg/dL (ref 7.0–26.0)
CO2: 29 mEq/L (ref 22–29)
Chloride: 103 mEq/L (ref 98–109)
Creatinine: 1 mg/dL (ref 0.7–1.3)
Sodium: 140 mEq/L (ref 136–145)
Total Bilirubin: 0.42 mg/dL (ref 0.20–1.20)
Total Protein: 6.9 g/dL (ref 6.4–8.3)

## 2012-12-09 LAB — PSA: PSA: 2.77 ng/mL (ref <=4.00)

## 2012-12-09 NOTE — Progress Notes (Signed)
Hematology and Oncology Follow Up Visit  [name] [id number] 1946/01/02 67 y.o. 12/09/2012 8:34 AM   Principle Diagnosis: 67 year old gentleman with prostate cancer diagnosed in 2003. He had a Gleason score 4 + 3 equals 7. PSA 6.45.   Prior Therapy:  1. He is status post a prostatectomy and lymph node dissection done on November 05, 2001. He had a Gleason score 4 + 3 equals 7. He had  focal extracapsular extension, both seminal vesicles were involved indicating his stage was T3b N0.  2. The patient received a total of 6480 cGy of radiation therapy in 36 fractions between February and April 2005.  3. The patient had a rise in August of 2011 up to 35 and he was started on Lupron. His PSA nadir down to 4.11. Most recently his PSA was up to 5.91 in December of 2012 with a testosterone level of 35. His staging workup did not reveal any bony metastasis. His his PSA went up to 10 in 04/2011.   Current therapy: Casodex was added to Lupron on 05/2011.  Interim History: [name] presents today for a followup visit. He is a pleasant gentleman who presents today for a follow up visit. He is reporting no new symptoms. He have tolerated Casodex well without complications. He is not reporting any abdominal pain. He has not reported any genitourinary complaints. He does report minor complication related to Lupron such as hot flashes and breast tenderness but both have improved some.  He does not report any back pain. He has not reported any shoulder pain. Performance status and activity level remains unchanged. He continued to perform regularly with out decline. No recent illnesses or hospitalizations.  He is not reporting any new pain at this time.    Medications: I have reviewed the patient's current medications.  Current Outpatient Prescriptions  Medication Sig Dispense Refill  . ALPRAZolam (XANAX) 0.5 MG tablet Take 0.5 mg by mouth 3 (three) times daily as needed for sleep.      .  bicalutamide (CASODEX) 50 MG tablet TAKE 1 TABLET BY MOUTH ONCE DAILY  30 tablet  3  . Leuprolide Acetate (LUPRON DEPOT IM) Inject into the muscle every 4 (four) months.      . warfarin (COUMADIN) 5 MG tablet Take 5 mg by mouth daily. 7.5 mg or 10 mg       No current facility-administered medications for this visit.    Allergies:  Allergies  Allergen Reactions  . Penicillins     Past Medical History, Surgical history, Social history, and Family History were reviewed and updated.  Review of Systems: Remaining ROS negative. Physical Exam: Blood pressure 128/68, pulse 61, temperature 98 F (36.7 C), temperature source Oral, resp. rate 20, height 6\' 2"  (1.88 m), weight 236 lb 4.8 oz (107.185 kg). ECOG: 1 General appearance: alert Head: Normocephalic, without obvious abnormality, atraumatic Neck: no adenopathy, no carotid bruit, no JVD, supple, symmetrical, trachea midline and thyroid not enlarged, symmetric, no tenderness/mass/nodules Lymph nodes: Cervical, supraclavicular, and axillary nodes normal. Heart:regular rate and rhythm, S1, S2 normal, no murmur, click, rub or gallop Lung:chest clear, no wheezing, rales, normal symmetric air entry Abdomin: soft, non-tender, without masses or organomegaly EXT:no erythema, induration, or nodules   Lab Results: Lab Results  Component Value Date   WBC 5.6 12/09/2012   HGB 13.2 12/09/2012   HCT 39.5 12/09/2012   MCV 91.0 12/09/2012   PLT 210 12/09/2012     Chemistry      Component Value  Date/Time   NA 141 09/11/2012 0820   NA 140 09/18/2011 0915   K 4.2 09/11/2012 0820   K 4.7 09/18/2011 0915   CL 104 06/11/2012 0817   CL 104 09/18/2011 0915   CO2 28 09/11/2012 0820   CO2 31 09/18/2011 0915   BUN 17.8 09/11/2012 0820   BUN 14 09/18/2011 0915   CREATININE 1.0 09/11/2012 0820   CREATININE 0.93 09/18/2011 0915      Component Value Date/Time   CALCIUM 9.4 09/11/2012 0820   CALCIUM 9.1 09/18/2011 0915   ALKPHOS 47 09/11/2012 0820   ALKPHOS 41  09/18/2011 0915   AST 20 09/11/2012 0820   AST 23 09/18/2011 0915   ALT 17 09/11/2012 0820   ALT 25 09/18/2011 0915   BILITOT 0.58 09/11/2012 0820   BILITOT 0.5 09/18/2011 0915      Results for [place], NESHAWN AIRD (MRN [id number]) as of 12/09/2012 08:14  Ref. Range 06/11/2012 08:17 09/11/2012 08:20  PSA Latest Range: <=4.00 ng/mL 1.96 2.07     Impression and Plan: 1. This is a pleasant 67 year old gentleman with prostate cancer. 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 He is doing well on Casodex. His PSA is dropped from  10.53 to 1.8. And now it is around 2.0. The plan is to continue with casodex as he tolerated it very well without complications. If his PSA rises rapidly, we will stage him with a bone scan. He expressed some reservation about the cost of another bone scan and would like to defer that as much as possible.   2. Androgen deprivation. He continues to be on Lupron under the care of Dr. Isabel Caprice.   3. Bony disease. His bone scan done in January 2013 was negative.  4. Follow up: in 3 months.     [facility], MD 10/14/20148:34 AM

## 2012-12-09 NOTE — Telephone Encounter (Signed)
gv adn printed appt sched and avs for pt for Jan 2015 °

## 2012-12-10 ENCOUNTER — Telehealth: Payer: Self-pay | Admitting: *Deleted

## 2012-12-10 NOTE — Telephone Encounter (Signed)
Gave patient results of PSA 

## 2012-12-10 NOTE — Telephone Encounter (Signed)
Message copied by Reesa Chew on Wed Dec 10, 2012  4:45 PM ------      Message from: Benjiman Core      Created: Wed Dec 10, 2012  9:23 AM       Please call his PSA. Not much change. ------

## 2013-01-20 ENCOUNTER — Other Ambulatory Visit: Payer: Self-pay | Admitting: Oncology

## 2013-03-03 ENCOUNTER — Telehealth: Payer: Self-pay | Admitting: Oncology

## 2013-03-03 ENCOUNTER — Other Ambulatory Visit (HOSPITAL_BASED_OUTPATIENT_CLINIC_OR_DEPARTMENT_OTHER): Payer: Medicare Other

## 2013-03-03 ENCOUNTER — Ambulatory Visit (HOSPITAL_BASED_OUTPATIENT_CLINIC_OR_DEPARTMENT_OTHER): Payer: Medicare Other | Admitting: Oncology

## 2013-03-03 VITALS — BP 147/77 | HR 68 | Temp 97.5°F | Resp 18 | Ht 74.0 in | Wt 239.7 lb

## 2013-03-03 DIAGNOSIS — C61 Malignant neoplasm of prostate: Secondary | ICD-10-CM

## 2013-03-03 DIAGNOSIS — E291 Testicular hypofunction: Secondary | ICD-10-CM

## 2013-03-03 LAB — CBC WITH DIFFERENTIAL/PLATELET
BASO%: 0.6 % (ref 0.0–2.0)
Basophils Absolute: 0 10*3/uL (ref 0.0–0.1)
EOS ABS: 0.3 10*3/uL (ref 0.0–0.5)
EOS%: 4.9 % (ref 0.0–7.0)
HCT: 41.1 % (ref 38.4–49.9)
HGB: 13.8 g/dL (ref 13.0–17.1)
LYMPH%: 28.6 % (ref 14.0–49.0)
MCH: 30.8 pg (ref 27.2–33.4)
MCHC: 33.6 g/dL (ref 32.0–36.0)
MCV: 91.7 fL (ref 79.3–98.0)
MONO#: 0.4 10*3/uL (ref 0.1–0.9)
MONO%: 6.8 % (ref 0.0–14.0)
NEUT%: 59.1 % (ref 39.0–75.0)
NEUTROS ABS: 3.9 10*3/uL (ref 1.5–6.5)
PLATELETS: 210 10*3/uL (ref 140–400)
RBC: 4.49 10*6/uL (ref 4.20–5.82)
RDW: 13.2 % (ref 11.0–14.6)
WBC: 6.5 10*3/uL (ref 4.0–10.3)
lymph#: 1.9 10*3/uL (ref 0.9–3.3)

## 2013-03-03 LAB — COMPREHENSIVE METABOLIC PANEL (CC13)
ALBUMIN: 3.5 g/dL (ref 3.5–5.0)
ALT: 17 U/L (ref 0–55)
ANION GAP: 9 meq/L (ref 3–11)
AST: 17 U/L (ref 5–34)
Alkaline Phosphatase: 47 U/L (ref 40–150)
BUN: 14.7 mg/dL (ref 7.0–26.0)
CO2: 29 mEq/L (ref 22–29)
Calcium: 9.3 mg/dL (ref 8.4–10.4)
Chloride: 105 mEq/L (ref 98–109)
Creatinine: 0.9 mg/dL (ref 0.7–1.3)
GLUCOSE: 110 mg/dL (ref 70–140)
POTASSIUM: 4.2 meq/L (ref 3.5–5.1)
Sodium: 142 mEq/L (ref 136–145)
TOTAL PROTEIN: 7.2 g/dL (ref 6.4–8.3)
Total Bilirubin: 0.46 mg/dL (ref 0.20–1.20)

## 2013-03-03 LAB — PSA: PSA: 3.34 ng/mL (ref <=4.00)

## 2013-03-03 NOTE — Telephone Encounter (Signed)
gv and printed appt sched and avs for pt for April  °

## 2013-03-03 NOTE — Progress Notes (Signed)
Hematology and Oncology Follow Up Visit  [name] [id number] 1946-01-22 68 y.o. 03/03/2013 8:40 AM   Principle Diagnosis: 68 year old gentleman with prostate cancer diagnosed in 2003. He had a Gleason score 4 + 3 equals 7. PSA 6.45.   Prior Therapy:  1. He is status post a prostatectomy and lymph node dissection done on November 05, 2001. He had a Gleason score 4 + 3 equals 7. He had  focal extracapsular extension, both seminal vesicles were involved indicating his stage was T3b N0.  2. The patient received a total of 6480 cGy of radiation therapy in 36 fractions between February and April 2005.  3. The patient had a rise in August of 2011 up to 35 and he was started on Lupron. His PSA nadir down to 4.11. Most recently his PSA was up to 5.91 in December of 2012 with a testosterone level of 35. His staging workup did not reveal any bony metastasis. His his PSA went up to 10 in 04/2011.   Current therapy: Casodex was added to Lupron on 05/2011.  Interim History: [name] presents today for a followup visit. He is a pleasant gentleman who presents today for a follow up visit. He is reporting no new symptoms. He have tolerated Casodex well without complications. He is not reporting any abdominal pain. He has not reported any genitourinary complaints. He does report minor complication related to Lupron such as hot flashes and breast tenderness but both have improved some.  He does not report any back pain. He has not reported any shoulder pain. Performance status and activity level remains unchanged. He continued to perform regularly with out decline. No recent illnesses or hospitalizations.  He has not reported any bone pain or discomfort. Has not reported any pathological fractures or hospitalizations.   Medications: I have reviewed the patient's current medications.  Current Outpatient Prescriptions  Medication Sig Dispense Refill  . ALPRAZolam (XANAX) 0.5 MG tablet Take 0.5 mg by  mouth 3 (three) times daily as needed for sleep.      . bicalutamide (CASODEX) 50 MG tablet TAKE 1 TABLET BY MOUTH ONCE DAILY  30 tablet  3  . Leuprolide Acetate (LUPRON DEPOT IM) Inject into the muscle every 4 (four) months.      . warfarin (COUMADIN) 5 MG tablet Take 5 mg by mouth daily. 7.5 mg or 10 mg       No current facility-administered medications for this visit.    Allergies:  Allergies  Allergen Reactions  . Penicillins     Past Medical History, Surgical history, Social history, and Family History were reviewed and updated.  Review of Systems: Remaining ROS negative. Physical Exam: Blood pressure 147/77, pulse 68, temperature 97.5 F (36.4 C), temperature source Oral, resp. rate 18, height 6\' 2"  (1.88 m), weight 239 lb 11.2 oz (108.727 kg). ECOG: 1 General appearance: alert Head: Normocephalic, without obvious abnormality, atraumatic Neck: no adenopathy, no carotid bruit, no JVD, supple, symmetrical, trachea midline and thyroid not enlarged, symmetric, no tenderness/mass/nodules Lymph nodes: Cervical, supraclavicular, and axillary nodes normal. Heart:regular rate and rhythm, S1, S2 normal, no murmur, click, rub or gallop Lung:chest clear, no wheezing, rales, normal symmetric air entry Abdomin: soft, non-tender, without masses or organomegaly EXT:no erythema, induration, or nodules   Lab Results: Lab Results  Component Value Date   WBC 6.5 03/03/2013   HGB 13.8 03/03/2013   HCT 41.1 03/03/2013   MCV 91.7 03/03/2013   PLT 210 03/03/2013     Chemistry  Component Value Date/Time   NA 140 12/09/2012 0807   NA 140 09/18/2011 0915   K 4.2 12/09/2012 0807   K 4.7 09/18/2011 0915   CL 104 06/11/2012 0817   CL 104 09/18/2011 0915   CO2 29 12/09/2012 0807   CO2 31 09/18/2011 0915   BUN 19.3 12/09/2012 0807   BUN 14 09/18/2011 0915   CREATININE 1.0 12/09/2012 0807   CREATININE 0.93 09/18/2011 0915      Component Value Date/Time   CALCIUM 9.1 12/09/2012 0807   CALCIUM 9.1  09/18/2011 0915   ALKPHOS 40 12/09/2012 0807   ALKPHOS 41 09/18/2011 0915   AST 21 12/09/2012 0807   AST 23 09/18/2011 0915   ALT 16 12/09/2012 0807   ALT 25 09/18/2011 0915   BILITOT 0.42 12/09/2012 0807   BILITOT 0.5 09/18/2011 0915      Results for [name] (MRN [id number]) as of 03/03/2013 08:15  Ref. Range 09/11/2012 08:20 12/09/2012 08:07  PSA Latest Range: <=4.00 ng/mL 2.07 2.77      Impression and Plan: 1. This is a pleasant 68 year old gentleman with prostate cancer. 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 He is doing well on Casodex. His PSA is dropped from  10.53 to 1.8. And now it is around 2.7. The plan is to continue with casodex as he tolerated it very well without complications. I will repeat his bone scan before the next visit and if he is developing metastatic disease we will consider alternative options. I discussed with him some of these options today including Provenge immune therapy, second line hormonal manipulation with ketoconazole and prednisone, Zytiga and Xtandi.  2. Androgen deprivation. He continues to be on Lupron under the care of Dr. Risa Grill.   3. Bony disease. His bone scan done in January 2013 was negative.  4. Follow up: in 3 months.     [facility], MD 1/6/20158:40 AM

## 2013-03-04 ENCOUNTER — Telehealth: Payer: Self-pay | Admitting: *Deleted

## 2013-03-04 NOTE — Telephone Encounter (Signed)
Spoke with patient, gave results of latest PSA 

## 2013-03-04 NOTE — Telephone Encounter (Signed)
Message copied by Randolm Idol on Wed Mar 04, 2013  1:11 PM ------      Message from: [name]      Created: Wed Mar 04, 2013  9:01 AM       Please call his PSA. Keep on the same medicine for now. ------

## 2013-03-06 ENCOUNTER — Encounter: Payer: Self-pay | Admitting: Oncology

## 2013-05-19 ENCOUNTER — Other Ambulatory Visit: Payer: Self-pay | Admitting: Oncology

## 2013-06-01 ENCOUNTER — Other Ambulatory Visit (HOSPITAL_BASED_OUTPATIENT_CLINIC_OR_DEPARTMENT_OTHER): Payer: Medicare Other

## 2013-06-01 ENCOUNTER — Encounter (HOSPITAL_COMMUNITY)
Admission: RE | Admit: 2013-06-01 | Discharge: 2013-06-01 | Disposition: A | Discharge status: Home / Self Care | Payer: Medicare Other | Source: Ambulatory Visit | Attending: Oncology | Admitting: Oncology

## 2013-06-01 ENCOUNTER — Ambulatory Visit (HOSPITAL_COMMUNITY)
Admission: RE | Admit: 2013-06-01 | Discharge: 2013-06-01 | Disposition: A | Discharge status: Home / Self Care | Payer: Medicare Other | Source: Ambulatory Visit | Attending: Oncology | Admitting: Oncology

## 2013-06-01 DIAGNOSIS — C61 Malignant neoplasm of prostate: Secondary | ICD-10-CM

## 2013-06-01 LAB — CBC WITH DIFFERENTIAL/PLATELET
BASO%: 0.4 % (ref 0.0–2.0)
BASOS ABS: 0 10*3/uL (ref 0.0–0.1)
EOS%: 4.8 % (ref 0.0–7.0)
Eosinophils Absolute: 0.3 10*3/uL (ref 0.0–0.5)
HCT: 40 % (ref 38.4–49.9)
HEMOGLOBIN: 13.4 g/dL (ref 13.0–17.1)
LYMPH%: 27.4 % (ref 14.0–49.0)
MCH: 30.7 pg (ref 27.2–33.4)
MCHC: 33.6 g/dL (ref 32.0–36.0)
MCV: 91.4 fL (ref 79.3–98.0)
MONO#: 0.4 10*3/uL (ref 0.1–0.9)
MONO%: 7.3 % (ref 0.0–14.0)
NEUT%: 60.1 % (ref 39.0–75.0)
NEUTROS ABS: 3.6 10*3/uL (ref 1.5–6.5)
PLATELETS: 205 10*3/uL (ref 140–400)
RBC: 4.37 10*6/uL (ref 4.20–5.82)
RDW: 13.1 % (ref 11.0–14.6)
WBC: 6 10*3/uL (ref 4.0–10.3)
lymph#: 1.6 10*3/uL (ref 0.9–3.3)

## 2013-06-01 LAB — COMPREHENSIVE METABOLIC PANEL (CC13)
ALK PHOS: 43 U/L (ref 40–150)
ALT: 14 U/L (ref 0–55)
AST: 21 U/L (ref 5–34)
Albumin: 3.7 g/dL (ref 3.5–5.0)
Anion Gap: 10 mEq/L (ref 3–11)
BUN: 14.4 mg/dL (ref 7.0–26.0)
CO2: 27 mEq/L (ref 22–29)
Calcium: 9.6 mg/dL (ref 8.4–10.4)
Chloride: 106 mEq/L (ref 98–109)
Creatinine: 1 mg/dL (ref 0.7–1.3)
GLUCOSE: 96 mg/dL (ref 70–140)
Potassium: 4.3 mEq/L (ref 3.5–5.1)
Sodium: 143 mEq/L (ref 136–145)
Total Bilirubin: 0.58 mg/dL (ref 0.20–1.20)
Total Protein: 7.2 g/dL (ref 6.4–8.3)

## 2013-06-01 LAB — PSA: PSA: 4.25 ng/mL — ABNORMAL HIGH (ref <=4.00)

## 2013-06-01 MED ORDER — TECHNETIUM TC 99M MEDRONATE IV KIT
26.0000 | PACK | Freq: Once | INTRAVENOUS | Status: AC | PRN
Start: 2013-06-01 — End: 2013-06-01
  Administered 2013-06-01: 26 via INTRAVENOUS

## 2013-06-03 ENCOUNTER — Encounter: Payer: Self-pay | Admitting: Oncology

## 2013-06-03 ENCOUNTER — Telehealth: Payer: Self-pay | Admitting: Oncology

## 2013-06-03 ENCOUNTER — Ambulatory Visit (HOSPITAL_BASED_OUTPATIENT_CLINIC_OR_DEPARTMENT_OTHER): Payer: Medicare Other | Admitting: Oncology

## 2013-06-03 VITALS — BP 145/68 | HR 71 | Temp 97.7°F | Resp 18 | Ht 74.0 in | Wt 239.1 lb

## 2013-06-03 DIAGNOSIS — M899 Disorder of bone, unspecified: Secondary | ICD-10-CM

## 2013-06-03 DIAGNOSIS — C61 Malignant neoplasm of prostate: Secondary | ICD-10-CM

## 2013-06-03 DIAGNOSIS — M949 Disorder of cartilage, unspecified: Secondary | ICD-10-CM

## 2013-06-03 NOTE — Progress Notes (Signed)
Hematology and Oncology Follow Up Visit  JEMARION ROYCROFT [id number] 05/21/1945 68 y.o. 06/03/2013 9:11 AM   Principle Diagnosis: 68 year old gentleman with prostate cancer diagnosed in 2003. He had a Gleason score 4 + 3 equals 7. PSA 6.45.   Prior Therapy:  1. He is status post a prostatectomy and lymph node dissection done on November 05, 2001. He had a Gleason score 4 + 3 equals 7. He had  focal extracapsular extension, both seminal vesicles were involved indicating his stage was T3b N0.  2. The patient received a total of 6480 cGy of radiation therapy in 36 fractions between February and April 2005.  3. The patient had a rise in August of 2011 up to 35 and he was started on Lupron. His PSA nadir down to 4.11. Most recently his PSA was up to 5.91 in December of 2012 with a testosterone level of 35. His staging workup did not reveal any bony metastasis. His his PSA went up to 10 in 04/2011.   Current therapy: Casodex was added to Lupron on 05/2011.  Interim History: Mr. Delaguila presents today for a followup visit. He is a pleasant gentleman who presents today for a follow up visit. He is reporting no new symptoms. He have tolerated Casodex well without complications. He is not reporting any abdominal pain. He has not reported any genitourinary complaints.He does not report any back pain. He has not reported any shoulder pain. Performance status and activity level remains unchanged. He continued to perform regularly with out decline. No recent illnesses or hospitalizations.  He has not reported any bone pain or discomfort. 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 He has not reported any GI or GU symptoms. He has not reported any recurrent infections.    Medications: I have reviewed the patient's current medications.  Current Outpatient Prescriptions  Medication Sig Dispense Refill  . ALPRAZolam (XANAX) 0.5 MG tablet Take 0.5 mg by  mouth 3 (three) times daily as needed for sleep.      . bicalutamide (CASODEX) 50 MG tablet TAKE 1 TABLET BY MOUTH ONCE DAILY  30 tablet  3  . Leuprolide Acetate (LUPRON DEPOT IM) Inject into the muscle every 4 (four) months.      . warfarin (COUMADIN) 5 MG tablet Take 5 mg by mouth daily. 7.5 mg or 10 mg       No current facility-administered medications for this visit.    Allergies:  Allergies  Allergen Reactions  . Penicillins     Past Medical History, Surgical history, Social history, and Family History were reviewed and updated.  Review of Systems: Remaining ROS negative. Physical Exam: Blood pressure 145/68, pulse 71, temperature 97.7 F (36.5 C), temperature source Oral, resp. rate 18, height 6\' 2"  (1.88 m), weight 239 lb 1.6 oz (108.455 kg), SpO2 99.00%. ECOG: 1 General appearance: alert Head: Normocephalic, without obvious abnormality, atraumatic Neck: no adenopathy, no carotid bruit, no JVD, supple, symmetrical, trachea midline and thyroid not enlarged, symmetric, no tenderness/mass/nodules Lymph nodes: Cervical, supraclavicular, and axillary nodes normal. Heart:regular rate and rhythm, S1, S2 normal, no murmur, click, rub or gallop Lung:chest clear, no wheezing, rales, normal symmetric air entry Abdomin: soft, non-tender, without masses or organomegaly EXT:no erythema, induration, or nodules   Lab Results: Lab Results  Component Value Date   WBC 6.0 06/01/2013   HGB 13.4 06/01/2013   HCT 40.0 06/01/2013   MCV 91.4 06/01/2013  PLT 205 06/01/2013     Chemistry      Component Value Date/Time   NA 143 06/01/2013 0806   NA 140 09/18/2011 0915   K 4.3 06/01/2013 0806   K 4.7 09/18/2011 0915   CL 104 06/11/2012 0817   CL 104 09/18/2011 0915   CO2 27 06/01/2013 0806   CO2 31 09/18/2011 0915   BUN 14.4 06/01/2013 0806   BUN 14 09/18/2011 0915   CREATININE 1.0 06/01/2013 0806   CREATININE 0.93 09/18/2011 0915      Component Value Date/Time   CALCIUM 9.6 06/01/2013 0806   CALCIUM 9.1  09/18/2011 0915   ALKPHOS 43 06/01/2013 0806   ALKPHOS 41 09/18/2011 0915   AST 21 06/01/2013 0806   AST 23 09/18/2011 0915   ALT 14 06/01/2013 0806   ALT 25 09/18/2011 0915   BILITOT 0.58 06/01/2013 0806   BILITOT 0.5 09/18/2011 0915      Results for Gan, IBROHIM SIMMERS (MRN [id number]) as of 06/03/2013 08:25  Ref. Range 03/03/2013 08:14 06/01/2013 08:06  PSA Latest Range: <=4.00 ng/mL 3.34 4.25 (H)     EXAM:  NUCLEAR MEDICINE WHOLE BODY BONE SCAN  TECHNIQUE:  Whole body anterior and posterior images were obtained approximately  3 hours after intravenous injection of radiopharmaceutical.  RADIOPHARMACEUTICALS: 26.0Technetium-99 MDP  COMPARISON: NM BONE WHOLE BODY dated 03/12/2011  FINDINGS:  Bilateral renal function and excretion. Single punctate increased  activity focus noted in a right anterior lower rib and the left  pelvis unchanged. Activity again noted about the left first  metatarsophalangeal joint, most likely degenerative, this is  unchanged.No evidence of developing metastatic disease.  IMPRESSION:  Stable exam. No evidence of developing metastatic disease.   Impression and Plan: 1. This is a pleasant 68 year old gentleman with prostate cancer. 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 He is doing well on Casodex. His PSA is dropped from  10.53 to 1.8. And now it is up to 4.25. His bone scan results from 06/01/2013 was discussed with the patient today and did not show any evidence of disease. The plan is to continue with casodex as he tolerated it very well without complications. If his PSA continued to rise with a rapid doubling time we can consider switching him to ketoconazole after antiandrogen withdrawal.  2. Androgen deprivation. He continues to be on Lupron under the care of Dr. Risa Grill.   3. Bony disease. His bone scan done in 06/01/2013 was normal.  4. Follow up: in 3 months.     [name], MD 4/8/20159:11 AM

## 2013-06-03 NOTE — Telephone Encounter (Signed)
gv and printed aptp sched and avs for pt for July.... °

## 2013-08-31 ENCOUNTER — Other Ambulatory Visit (HOSPITAL_BASED_OUTPATIENT_CLINIC_OR_DEPARTMENT_OTHER): Payer: Medicare Other

## 2013-08-31 DIAGNOSIS — C61 Malignant neoplasm of prostate: Secondary | ICD-10-CM

## 2013-08-31 LAB — CBC WITH DIFFERENTIAL/PLATELET
BASO%: 0.5 % (ref 0.0–2.0)
BASOS ABS: 0 10*3/uL (ref 0.0–0.1)
EOS ABS: 0.3 10*3/uL (ref 0.0–0.5)
EOS%: 4.9 % (ref 0.0–7.0)
HCT: 40.1 % (ref 38.4–49.9)
HEMOGLOBIN: 13.4 g/dL (ref 13.0–17.1)
LYMPH%: 28.2 % (ref 14.0–49.0)
MCH: 30 pg (ref 27.2–33.4)
MCHC: 33.3 g/dL (ref 32.0–36.0)
MCV: 90.1 fL (ref 79.3–98.0)
MONO#: 0.4 10*3/uL (ref 0.1–0.9)
MONO%: 7.3 % (ref 0.0–14.0)
NEUT#: 3.6 10*3/uL (ref 1.5–6.5)
NEUT%: 59.1 % (ref 39.0–75.0)
Platelets: 204 10*3/uL (ref 140–400)
RBC: 4.45 10*6/uL (ref 4.20–5.82)
RDW: 13.7 % (ref 11.0–14.6)
WBC: 6.1 10*3/uL (ref 4.0–10.3)
lymph#: 1.7 10*3/uL (ref 0.9–3.3)

## 2013-08-31 LAB — COMPREHENSIVE METABOLIC PANEL (CC13)
ALBUMIN: 3.3 g/dL — AB (ref 3.5–5.0)
ALT: 15 U/L (ref 0–55)
ANION GAP: 7 meq/L (ref 3–11)
AST: 18 U/L (ref 5–34)
Alkaline Phosphatase: 42 U/L (ref 40–150)
BUN: 16.2 mg/dL (ref 7.0–26.0)
CALCIUM: 9.1 mg/dL (ref 8.4–10.4)
CHLORIDE: 106 meq/L (ref 98–109)
CO2: 28 meq/L (ref 22–29)
Creatinine: 1.2 mg/dL (ref 0.7–1.3)
GLUCOSE: 115 mg/dL (ref 70–140)
Potassium: 4 mEq/L (ref 3.5–5.1)
SODIUM: 140 meq/L (ref 136–145)
TOTAL PROTEIN: 6.8 g/dL (ref 6.4–8.3)
Total Bilirubin: 0.48 mg/dL (ref 0.20–1.20)

## 2013-09-01 LAB — PSA: PSA: 5.64 ng/mL — ABNORMAL HIGH (ref <=4.00)

## 2013-09-01 LAB — TESTOSTERONE: TESTOSTERONE: 29 ng/dL — AB (ref 300–890)

## 2013-09-02 ENCOUNTER — Telehealth: Payer: Self-pay | Admitting: Oncology

## 2013-09-02 ENCOUNTER — Ambulatory Visit (HOSPITAL_BASED_OUTPATIENT_CLINIC_OR_DEPARTMENT_OTHER): Payer: Medicare Other | Admitting: Oncology

## 2013-09-02 ENCOUNTER — Encounter: Payer: Self-pay | Admitting: Oncology

## 2013-09-02 VITALS — BP 138/74 | HR 73 | Temp 97.6°F | Resp 18 | Ht 74.0 in | Wt 239.6 lb

## 2013-09-02 DIAGNOSIS — C61 Malignant neoplasm of prostate: Secondary | ICD-10-CM

## 2013-09-02 DIAGNOSIS — M899 Disorder of bone, unspecified: Secondary | ICD-10-CM

## 2013-09-02 DIAGNOSIS — E291 Testicular hypofunction: Secondary | ICD-10-CM

## 2013-09-02 DIAGNOSIS — M949 Disorder of cartilage, unspecified: Secondary | ICD-10-CM

## 2013-09-02 NOTE — Progress Notes (Signed)
Hematology and Oncology Follow Up Visit  [name] [id number] 1945-08-30 68 y.o. 09/02/2013 8:42 AM   Principle Diagnosis: 68 year old gentleman with prostate cancer diagnosed in 2003. He had a Gleason score 4 + 3 equals 7. PSA 6.45.   Prior Therapy:  1. He is status post a prostatectomy and lymph node dissection done on November 05, 2001. He had a Gleason score 4 + 3 equals 7. He had  focal extracapsular extension, both seminal vesicles were involved indicating his stage was T3b N0.  2. The patient received a total of 6480 cGy of radiation therapy in 36 fractions between February and April 2005.  3. The patient had a rise in August of 2011 up to 35 and he was started on Lupron. His PSA nadir down to 4.11. Most recently his PSA was up to 5.91 in December of 2012 with a testosterone level of 35. His staging workup did not reveal any bony metastasis. His his PSA went up to 10 in 04/2011.   Current therapy: Casodex was added to Lupron on 05/2011.  Interim History: [name] presents today for a followup visit. Since his last visit, he is reporting no new symptoms. He continues to tolerate Casodex well without complications. He is not reporting any abdominal pain. He has not reported any genitourinary complaints.He does not report any back pain. He has not reported any shoulder pain. Performance status and activity level remains unchanged. No recent illnesses or hospitalizations.  He has not reported any bone pain or discomfort. 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 He has not reported any headaches or blurry vision or syncope. Has not reported any chest pain breath. Has not reported any palpitation or leg edema. He did not report any nausea or vomiting or abdominal pain. He has not reported any lymphadenopathy or petechiae. Rest of the review of systems unremarkable.   Medications: I have reviewed the patient's current  medications.  Current Outpatient Prescriptions  Medication Sig Dispense Refill  . ALPRAZolam (XANAX) 0.5 MG tablet Take 0.5 mg by mouth 3 (three) times daily as needed for sleep.      . bicalutamide (CASODEX) 50 MG tablet TAKE 1 TABLET BY MOUTH ONCE DAILY  30 tablet  3  . Leuprolide Acetate (LUPRON DEPOT IM) Inject into the muscle every 4 (four) months.      . warfarin (COUMADIN) 5 MG tablet Take 5 mg by mouth daily. 7.5 mg or 10 mg       No current facility-administered medications for this visit.    Allergies:  Allergies  Allergen Reactions  . Penicillins     Past Medical History, Surgical history, Social history, and Family History were reviewed and updated.  Physical Exam: Blood pressure 138/74, pulse 73, temperature 97.6 F (36.4 C), temperature source Oral, resp. rate 18, height 6\' 2"  (1.88 m), weight 239 lb 9.6 oz (108.682 kg), SpO2 99.00%. ECOG: 1 General appearance: alert Head: Normocephalic, without obvious abnormality, atraumatic Neck: no adenopathy Lymph nodes: Cervical, supraclavicular, and axillary nodes normal. Heart:regular rate and rhythm, S1, S2 normal, no murmur, click, rub or gallop Lung:chest clear, no wheezing, rales, normal symmetric air entry Abdomin: soft, non-tender, without masses or organomegaly EXT:no erythema, induration, or nodules   Lab Results: Lab Results  Component Value Date   WBC 6.1 08/31/2013   HGB 13.4 08/31/2013   HCT 40.1 08/31/2013   MCV 90.1 08/31/2013   PLT 204 08/31/2013  Chemistry      Component Value Date/Time   NA 140 08/31/2013 0801   NA 140 09/18/2011 0915   K 4.0 08/31/2013 0801   K 4.7 09/18/2011 0915   CL 104 06/11/2012 0817   CL 104 09/18/2011 0915   CO2 28 08/31/2013 0801   CO2 31 09/18/2011 0915   BUN 16.2 08/31/2013 0801   BUN 14 09/18/2011 0915   CREATININE 1.2 08/31/2013 0801   CREATININE 0.93 09/18/2011 0915      Component Value Date/Time   CALCIUM 9.1 08/31/2013 0801   CALCIUM 9.1 09/18/2011 0915   ALKPHOS 42 08/31/2013  0801   ALKPHOS 41 09/18/2011 0915   AST 18 08/31/2013 0801   AST 23 09/18/2011 0915   ALT 15 08/31/2013 0801   ALT 25 09/18/2011 0915   BILITOT 0.48 08/31/2013 0801   BILITOT 0.5 09/18/2011 0915     Results for [name], [name] (MRN [id number]) as of 09/02/2013 08:07  Ref. Range 03/03/2013 08:14 06/01/2013 08:06 08/31/2013 08:02  PSA Latest Range: <=4.00 ng/mL 3.34 4.25 (H) 5.64 (H)    Impression and Plan:  1. This is a pleasant 68 year old gentleman with prostate cancer. 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 He is doing well on Casodex. His PSA initial dropped from  10.53 to 1.8. Most recently, his PSA had been on a slow rise. His last PSA is 5.64 with a doubling time close to 9 months. Options were discussed with the patient today including continuing the current regimen or switching to a different second line hormonal manipulation. Risks and benefits of other agents were discussed with the patient and we have elected to continue on Casodex for the time being. If his PSA doubles in 6 months or less we can certainly consider a different agent.  2. Androgen deprivation. He continues to be on Lupron under the care of Dr. Risa Nichols.   3. Bony disease. His bone scan done in 06/01/2013 was normal.  4. Follow up: in 3 months.     [facility], MD 7/8/20158:42 AM

## 2013-09-02 NOTE — Telephone Encounter (Signed)
gv adn printed appt sched and avs for pt for OCT °

## 2013-09-17 ENCOUNTER — Other Ambulatory Visit: Payer: Self-pay | Admitting: Oncology

## 2013-12-01 ENCOUNTER — Other Ambulatory Visit (HOSPITAL_BASED_OUTPATIENT_CLINIC_OR_DEPARTMENT_OTHER): Payer: Medicare Other

## 2013-12-01 DIAGNOSIS — C61 Malignant neoplasm of prostate: Secondary | ICD-10-CM

## 2013-12-01 LAB — COMPREHENSIVE METABOLIC PANEL (CC13)
ALBUMIN: 3.4 g/dL — AB (ref 3.5–5.0)
ALT: 17 U/L (ref 0–55)
AST: 17 U/L (ref 5–34)
Alkaline Phosphatase: 46 U/L (ref 40–150)
Anion Gap: 6 mEq/L (ref 3–11)
BUN: 16.1 mg/dL (ref 7.0–26.0)
CO2: 29 mEq/L (ref 22–29)
CREATININE: 1 mg/dL (ref 0.7–1.3)
Calcium: 9.4 mg/dL (ref 8.4–10.4)
Chloride: 106 mEq/L (ref 98–109)
Glucose: 106 mg/dl (ref 70–140)
POTASSIUM: 4.2 meq/L (ref 3.5–5.1)
Sodium: 141 mEq/L (ref 136–145)
Total Bilirubin: 0.48 mg/dL (ref 0.20–1.20)
Total Protein: 7.1 g/dL (ref 6.4–8.3)

## 2013-12-01 LAB — CBC WITH DIFFERENTIAL/PLATELET
BASO%: 0.5 % (ref 0.0–2.0)
Basophils Absolute: 0 10*3/uL (ref 0.0–0.1)
EOS%: 4.4 % (ref 0.0–7.0)
Eosinophils Absolute: 0.3 10*3/uL (ref 0.0–0.5)
HCT: 40.9 % (ref 38.4–49.9)
HEMOGLOBIN: 13.5 g/dL (ref 13.0–17.1)
LYMPH#: 1.9 10*3/uL (ref 0.9–3.3)
LYMPH%: 32.3 % (ref 14.0–49.0)
MCH: 29.9 pg (ref 27.2–33.4)
MCHC: 33 g/dL (ref 32.0–36.0)
MCV: 90.7 fL (ref 79.3–98.0)
MONO#: 0.4 10*3/uL (ref 0.1–0.9)
MONO%: 6.2 % (ref 0.0–14.0)
NEUT#: 3.3 10*3/uL (ref 1.5–6.5)
NEUT%: 56.6 % (ref 39.0–75.0)
Platelets: 219 10*3/uL (ref 140–400)
RBC: 4.5 10*6/uL (ref 4.20–5.82)
RDW: 13.2 % (ref 11.0–14.6)
WBC: 5.9 10*3/uL (ref 4.0–10.3)

## 2013-12-02 ENCOUNTER — Telehealth: Payer: Self-pay | Admitting: Oncology

## 2013-12-02 ENCOUNTER — Ambulatory Visit (HOSPITAL_BASED_OUTPATIENT_CLINIC_OR_DEPARTMENT_OTHER): Payer: Medicare Other | Admitting: Oncology

## 2013-12-02 ENCOUNTER — Encounter: Payer: Self-pay | Admitting: Oncology

## 2013-12-02 VITALS — BP 136/69 | HR 66 | Temp 97.5°F | Resp 18 | Ht 74.0 in | Wt 239.9 lb

## 2013-12-02 DIAGNOSIS — E291 Testicular hypofunction: Secondary | ICD-10-CM

## 2013-12-02 DIAGNOSIS — C61 Malignant neoplasm of prostate: Secondary | ICD-10-CM

## 2013-12-02 LAB — PSA: PSA: 7.91 ng/mL — ABNORMAL HIGH (ref <=4.00)

## 2013-12-02 NOTE — Progress Notes (Signed)
Hematology and Oncology Follow Up Visit  [name] [id number] 06-07-1945 68 y.o. 12/02/2013 8:47 AM   Principle Diagnosis: 68 year old gentleman with prostate cancer diagnosed in 2003. He had a Gleason score 4 + 3 equals 7. PSA 6.45.   Prior Therapy:  1. He is status post a prostatectomy and lymph node dissection done on November 05, 2001. He had a Gleason score 4 + 3 equals 7. He had  focal extracapsular extension, both seminal vesicles were involved indicating his stage was T3b N0.  2. The patient received a total of 6480 cGy of radiation therapy in 36 fractions between February and April 2005.  3. The patient had a rise in August of 2011 up to 35 and he was started on Lupron. His PSA nadir down to 4.11. Most recently his PSA was up to 5.91 in December of 2012 with a testosterone level of 35. His staging workup did not reveal any bony metastasis. His his PSA went up to 10 in 04/2011.   Current therapy: Casodex was added to Lupron on 05/2011.  Interim History: [name] presents today for a followup visit by him self. Since his last visit, he continues to do well. He continues to tolerate Casodex well without complications. He is not reporting any abdominal pain. He has not reported any genitourinary complaints.He does not report any back pain. He has not reported any shoulder pain. Performance status and activity level remains unchanged. No recent illnesses or hospitalizations. 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 He has not reported any bone pain or discomfort. He has not reported any headaches or blurry vision or syncope. Has not reported any chest pain breath. Has not reported any palpitation or leg edema. He did not report any nausea or vomiting or abdominal pain. He has not reported any lymphadenopathy or petechiae. Rest of the review of systems unremarkable.   Medications: I have reviewed the patient's current medications.  Current  Outpatient Prescriptions  Medication Sig Dispense Refill  . ALPRAZolam (XANAX) 0.5 MG tablet Take 0.5 mg by mouth 3 (three) times daily as needed for sleep.      . bicalutamide (CASODEX) 50 MG tablet TAKE 1 TABLET BY MOUTH ONCE DAILY  30 tablet  3  . Leuprolide Acetate (LUPRON DEPOT IM) Inject into the muscle every 4 (four) months.      . warfarin (COUMADIN) 5 MG tablet Take 5 mg by mouth daily. 7.5 mg or 10 mg       No current facility-administered medications for this visit.    Allergies:  Allergies  Allergen Reactions  . Penicillins     Past Medical History, Surgical history, Social history, and Family History were reviewed and updated.  Physical Exam: Blood pressure 136/69, pulse 66, temperature 97.5 F (36.4 C), temperature source Oral, resp. rate 18, height 6\' 2"  (1.88 m), weight 239 lb 14.4 oz (108.818 kg). ECOG: 1 General appearance: alert Head: Normocephalic, without obvious abnormality Neck: no adenopathy Lymph nodes: Cervical, supraclavicular, and axillary nodes normal. Heart:regular rate and rhythm, S1, S2 normal, no murmur, click, rub or gallop Lung:chest clear, no wheezing, rales, normal symmetric air entry Abdomin: soft, non-tender, without masses or organomegaly EXT:no erythema, induration, or nodules   Lab Results: Lab Results  Component Value Date   WBC 5.9 12/01/2013   HGB 13.5 12/01/2013   HCT 40.9 12/01/2013   MCV 90.7 12/01/2013   PLT 219 12/01/2013     Chemistry  Component Value Date/Time   NA 141 12/01/2013 0849   NA 140 09/18/2011 0915   K 4.2 12/01/2013 0849   K 4.7 09/18/2011 0915   CL 104 06/11/2012 0817   CL 104 09/18/2011 0915   CO2 29 12/01/2013 0849   CO2 31 09/18/2011 0915   BUN 16.1 12/01/2013 0849   BUN 14 09/18/2011 0915   CREATININE 1.0 12/01/2013 0849   CREATININE 0.93 09/18/2011 0915      Component Value Date/Time   CALCIUM 9.4 12/01/2013 0849   CALCIUM 9.1 09/18/2011 0915   ALKPHOS 46 12/01/2013 0849   ALKPHOS 41 09/18/2011 0915    AST 17 12/01/2013 0849   AST 23 09/18/2011 0915   ALT 17 12/01/2013 0849   ALT 25 09/18/2011 0915   BILITOT 0.48 12/01/2013 0849   BILITOT 0.5 09/18/2011 0915      Results for [name], [name] (MRN [id number]) as of 12/02/2013 07:44  Ref. Range 06/01/2013 08:06 08/31/2013 08:02 12/01/2013 08:48  PSA Latest Range: <=4.00 ng/mL 4.25 (H) 5.64 (H) 7.91 (H)    Impression and Plan:  1. This is a pleasant 68 year old gentleman with prostate cancer. 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 He is doing well on Casodex. His PSA initial dropped from  10.53 to 1.8. Most recently, his PSA had been on a slow rise up to 7.91. His doubling time is still exceed 6 months. Despite a slow rise in his PSA, he continues to benefit clinically from the current medication and prefers not to change. I see no need to switch to a second line hormonal manipulation at this time. But he understands that it will be a distinct possibility in the near future. I explained to him there are other agents to use in this particular setting this would include ketoconazole, Zytiga, Xtandi among others.  2. Androgen deprivation. He continues to be on Lupron under the care of Dr. Risa Grill.   3. Bony disease. His bone scan done in 06/01/2013 was normal.  4. Follow up: in 3 months.     [email or web address],SVXBL, MD 10/7/20158:47 AM

## 2013-12-02 NOTE — Telephone Encounter (Signed)
gv and printed appt sched and avs for pt for Jan 2016 °

## 2014-01-12 ENCOUNTER — Other Ambulatory Visit: Payer: Self-pay | Admitting: Oncology

## 2014-03-02 ENCOUNTER — Other Ambulatory Visit (HOSPITAL_BASED_OUTPATIENT_CLINIC_OR_DEPARTMENT_OTHER): Payer: Medicare Other

## 2014-03-02 DIAGNOSIS — C61 Malignant neoplasm of prostate: Secondary | ICD-10-CM

## 2014-03-02 LAB — COMPREHENSIVE METABOLIC PANEL (CC13)
ALK PHOS: 50 U/L (ref 40–150)
ALT: 15 U/L (ref 0–55)
AST: 19 U/L (ref 5–34)
Albumin: 3.7 g/dL (ref 3.5–5.0)
Anion Gap: 7 mEq/L (ref 3–11)
BILIRUBIN TOTAL: 0.49 mg/dL (ref 0.20–1.20)
BUN: 18.9 mg/dL (ref 7.0–26.0)
CO2: 32 mEq/L — ABNORMAL HIGH (ref 22–29)
CREATININE: 1 mg/dL (ref 0.7–1.3)
Calcium: 9.7 mg/dL (ref 8.4–10.4)
Chloride: 102 mEq/L (ref 98–109)
EGFR: 77 mL/min/{1.73_m2} — ABNORMAL LOW (ref >90)
Glucose: 99 mg/dl (ref 70–140)
Potassium: 4.4 mEq/L (ref 3.5–5.1)
Sodium: 142 mEq/L (ref 136–145)
Total Protein: 7.6 g/dL (ref 6.4–8.3)

## 2014-03-02 LAB — CBC WITH DIFFERENTIAL/PLATELET
BASO%: 0.3 % (ref 0.0–2.0)
BASOS ABS: 0 10*3/uL (ref 0.0–0.1)
EOS ABS: 0.3 10*3/uL (ref 0.0–0.5)
EOS%: 3.9 % (ref 0.0–7.0)
HCT: 42.6 % (ref 38.4–49.9)
HEMOGLOBIN: 13.9 g/dL (ref 13.0–17.1)
LYMPH#: 2.2 10*3/uL (ref 0.9–3.3)
LYMPH%: 30 % (ref 14.0–49.0)
MCH: 29.6 pg (ref 27.2–33.4)
MCHC: 32.6 g/dL (ref 32.0–36.0)
MCV: 90.6 fL (ref 79.3–98.0)
MONO#: 0.6 10*3/uL (ref 0.1–0.9)
MONO%: 8.2 % (ref 0.0–14.0)
NEUT%: 57.6 % (ref 39.0–75.0)
NEUTROS ABS: 4.2 10*3/uL (ref 1.5–6.5)
Platelets: 232 10*3/uL (ref 140–400)
RBC: 4.7 10*6/uL (ref 4.20–5.82)
RDW: 12.8 % (ref 11.0–14.6)
WBC: 7.2 10*3/uL (ref 4.0–10.3)

## 2014-03-03 LAB — PSA: PSA: 12.65 ng/mL — ABNORMAL HIGH (ref <=4.00)

## 2014-03-04 ENCOUNTER — Telehealth: Payer: Self-pay | Admitting: Oncology

## 2014-03-04 ENCOUNTER — Ambulatory Visit (HOSPITAL_BASED_OUTPATIENT_CLINIC_OR_DEPARTMENT_OTHER): Payer: Medicare Other | Admitting: Oncology

## 2014-03-04 VITALS — BP 138/69 | HR 75 | Resp 18 | Ht 74.0 in | Wt 235.6 lb

## 2014-03-04 DIAGNOSIS — C61 Malignant neoplasm of prostate: Secondary | ICD-10-CM

## 2014-03-04 NOTE — Progress Notes (Signed)
Hematology and Oncology Follow Up Visit  SEVILLE DOWNS [id number] 1945-12-23 69 y.o. 03/04/2014 8:45 AM   Principle Diagnosis: 69 year old gentleman with prostate cancer diagnosed in 2003. He had a Gleason score 4 + 3 equals 7. PSA 6.45.   Prior Therapy:  1. He is status post a prostatectomy and lymph node dissection done on November 05, 2001. He had a Gleason score 4 + 3 equals 7. He had  focal extracapsular extension, both seminal vesicles were involved indicating his stage was T3b N0.  2. The patient received a total of 6480 cGy of radiation therapy in 36 fractions between February and April 2005.  3. The patient had a rise in August of 2011 up to 35 and he was started on Lupron. His PSA nadir down to 4.11. Most recently his PSA was up to 5.91 in December of 2012 with a testosterone level of 35. His staging workup did not reveal any bony metastasis. His his PSA went up to 10 in 04/2011.   Current therapy: Casodex was added to Lupron on 05/2011.  Interim History: Mr. Diehl presents today for a followup visit by him self. Since his last visit, he ports no new issues. He continues to tolerate Casodex well without complications. He is not reporting any abdominal pain. He has not reported any genitourinary complaints.he does not report any bone pain at this time including back pain or shoulder pain. Performance status and activity level remains unchanged. No recent illnesses or hospitalizations. 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  He has not reported any headaches or blurry vision or syncope. Has not reported any chest pain breath. Has not reported any palpitation or leg edema. He does not report any wheezing, cough or hemoptysis. He did not report any nausea or vomiting or abdominal pain. He has not reported any lymphadenopathy or petechiae. Rest of the review of systems unremarkable.   Medications: I have reviewed the patient's current medications.   Current Outpatient Prescriptions  Medication Sig Dispense Refill  . ALPRAZolam (XANAX) 0.5 MG tablet Take 0.5 mg by mouth 3 (three) times daily as needed for sleep.    . bicalutamide (CASODEX) 50 MG tablet TAKE 1 TABLET BY MOUTH ONCE DAILY 30 tablet 3  . Leuprolide Acetate (LUPRON DEPOT IM) Inject into the muscle every 4 (four) months.    . warfarin (COUMADIN) 5 MG tablet Take 5 mg by mouth daily. 7.5 mg or 10 mg     No current facility-administered medications for this visit.    Allergies:  Allergies  Allergen Reactions  . Penicillins     Past Medical History, Surgical history, Social history, and Family History were reviewed and updated.  Physical Exam: Blood pressure 138/69, pulse 75, resp. rate 18, height 6\' 2"  (1.88 m), weight 235 lb 9.6 oz (106.867 kg), SpO2 100 %. ECOG: 1 General appearance: alert awake not in any distress. Head: Normocephalic, without obvious abnormality Neck: no adenopathy Lymph nodes: Cervical, supraclavicular, and axillary nodes normal. Heart:regular rate and rhythm, S1, S2 normal, no murmur, click, rub or gallop Lung:chest clear, no wheezing, rales, normal symmetric air entry Abdomin: soft, non-tender, without masses or organomegaly EXT:no erythema, induration, or nodules   Lab Results: Lab Results  Component Value Date   WBC 7.2 03/02/2014   HGB 13.9 03/02/2014   HCT 42.6 03/02/2014   MCV 90.6 03/02/2014   PLT 232 03/02/2014     Chemistry      Component Value Date/Time  NA 142 03/02/2014 0844   NA 140 09/18/2011 0915   K 4.4 03/02/2014 0844   K 4.7 09/18/2011 0915   CL 104 06/11/2012 0817   CL 104 09/18/2011 0915   CO2 32* 03/02/2014 0844   CO2 31 09/18/2011 0915   BUN 18.9 03/02/2014 0844   BUN 14 09/18/2011 0915   CREATININE 1.0 03/02/2014 0844   CREATININE 0.93 09/18/2011 0915      Component Value Date/Time   CALCIUM 9.7 03/02/2014 0844   CALCIUM 9.1 09/18/2011 0915   ALKPHOS 50 03/02/2014 0844   ALKPHOS 41 09/18/2011  0915   AST 19 03/02/2014 0844   AST 23 09/18/2011 0915   ALT 15 03/02/2014 0844   ALT 25 09/18/2011 0915   BILITOT 0.49 03/02/2014 0844   BILITOT 0.5 09/18/2011 0915      Results for Vanderveen, RIAZ ONORATO (MRN [id number]) as of 03/04/2014 08:27  Ref. Range 08/31/2013 08:02 12/01/2013 08:48 03/02/2014 08:44  PSA Latest Range: <=4.00 ng/mL 5.64 (H) 7.91 (H) 12.65 (H)     Impression and Plan:  1. This is a pleasant 69 year old gentleman with prostate cancer. 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 His PSA initial dropped from  10.53 to 1.8. Most recently, his PSA had been rising with a doubling time now is 6 months. He is completely asymptomatic from a prostate cancer standpoint.  Options of treatment were discussed today extensively. These would include antiandrogen withdrawal with second line hormonal manipulation, observation and surveillance and potentially other hormonal agents and systemic chemotherapy. Before proceeding with any treatments I would like to restage him with a CT scan of the chest abdomen and pelvis. I would like to also stopped Casodex and to see if he has any anti-androgen withdrawal benefit. I will repeat his PSA in about 6 weeks. I also discussed with him potentially starting a different hormonal agent. Risks and benefits of Xtandi versus Zytiga were discussed. Complications as well as medications were reviewed and written information was given to the patient. I feel Jeral Fruit will be a better choice for him given the medication side effect profile.  2. Androgen deprivation. He continues to be on Lupron under the care of Dr. Risa Grill.   3. Bony disease. His bone scan done in 06/01/2013 was normal.  4. Follow up: in 6 weeks after a CT scan and to discuss the start of Xtandi at that time.    [facility], MD 1/7/20168:45 AM

## 2014-03-04 NOTE — Telephone Encounter (Signed)
Pt called to confirm labs/ov per 01/07 POF, AB mailed out sch/cal to pt and confirmed information with pt and advised CT was sch also following labs.... KJ

## 2014-04-20 ENCOUNTER — Other Ambulatory Visit (HOSPITAL_BASED_OUTPATIENT_CLINIC_OR_DEPARTMENT_OTHER): Payer: Medicare Other

## 2014-04-20 ENCOUNTER — Ambulatory Visit (HOSPITAL_COMMUNITY)
Admission: RE | Admit: 2014-04-20 | Discharge: 2014-04-20 | Disposition: A | Discharge status: Home / Self Care | Payer: Medicare Other | Source: Ambulatory Visit | Attending: Oncology | Admitting: Oncology

## 2014-04-20 ENCOUNTER — Encounter (HOSPITAL_COMMUNITY): Payer: Self-pay

## 2014-04-20 DIAGNOSIS — Z9079 Acquired absence of other genital organ(s): Secondary | ICD-10-CM | POA: Insufficient documentation

## 2014-04-20 DIAGNOSIS — R932 Abnormal findings on diagnostic imaging of liver and biliary tract: Secondary | ICD-10-CM | POA: Insufficient documentation

## 2014-04-20 DIAGNOSIS — C61 Malignant neoplasm of prostate: Secondary | ICD-10-CM

## 2014-04-20 DIAGNOSIS — N62 Hypertrophy of breast: Secondary | ICD-10-CM | POA: Insufficient documentation

## 2014-04-20 DIAGNOSIS — Z08 Encounter for follow-up examination after completed treatment for malignant neoplasm: Secondary | ICD-10-CM | POA: Insufficient documentation

## 2014-04-20 LAB — CBC WITH DIFFERENTIAL/PLATELET
BASO%: 0.1 % (ref 0.0–2.0)
Basophils Absolute: 0 10*3/uL (ref 0.0–0.1)
EOS ABS: 0.2 10*3/uL (ref 0.0–0.5)
EOS%: 3 % (ref 0.0–7.0)
HCT: 40.6 % (ref 38.4–49.9)
HGB: 13.6 g/dL (ref 13.0–17.1)
LYMPH%: 29.8 % (ref 14.0–49.0)
MCH: 30.5 pg (ref 27.2–33.4)
MCHC: 33.5 g/dL (ref 32.0–36.0)
MCV: 91 fL (ref 79.3–98.0)
MONO#: 0.4 10*3/uL (ref 0.1–0.9)
MONO%: 5.9 % (ref 0.0–14.0)
NEUT%: 61.2 % (ref 39.0–75.0)
NEUTROS ABS: 4.2 10*3/uL (ref 1.5–6.5)
PLATELETS: 200 10*3/uL (ref 140–400)
RBC: 4.46 10*6/uL (ref 4.20–5.82)
RDW: 13.1 % (ref 11.0–14.6)
WBC: 6.9 10*3/uL (ref 4.0–10.3)
lymph#: 2.1 10*3/uL (ref 0.9–3.3)

## 2014-04-20 LAB — COMPREHENSIVE METABOLIC PANEL (CC13)
ALT: 14 U/L (ref 0–55)
ANION GAP: 9 meq/L (ref 3–11)
AST: 19 U/L (ref 5–34)
Albumin: 3.6 g/dL (ref 3.5–5.0)
Alkaline Phosphatase: 49 U/L (ref 40–150)
BILIRUBIN TOTAL: 0.36 mg/dL (ref 0.20–1.20)
BUN: 16.5 mg/dL (ref 7.0–26.0)
CALCIUM: 9.1 mg/dL (ref 8.4–10.4)
CHLORIDE: 104 meq/L (ref 98–109)
CO2: 28 meq/L (ref 22–29)
CREATININE: 0.9 mg/dL (ref 0.7–1.3)
EGFR: 87 mL/min/{1.73_m2} — AB (ref >90)
Glucose: 107 mg/dl (ref 70–140)
Potassium: 4.4 mEq/L (ref 3.5–5.1)
Sodium: 142 mEq/L (ref 136–145)
TOTAL PROTEIN: 7.2 g/dL (ref 6.4–8.3)

## 2014-04-20 MED ORDER — IOHEXOL 300 MG/ML  SOLN
100.0000 mL | Freq: Once | INTRAMUSCULAR | Status: AC | PRN
  Administered 2014-04-20: 100 mL via INTRAVENOUS

## 2014-04-21 LAB — PSA: PSA: 37.26 ng/mL — ABNORMAL HIGH (ref <=4.00)

## 2014-04-22 ENCOUNTER — Ambulatory Visit (HOSPITAL_BASED_OUTPATIENT_CLINIC_OR_DEPARTMENT_OTHER): Payer: Medicare Other | Admitting: Oncology

## 2014-04-22 ENCOUNTER — Telehealth: Payer: Self-pay | Admitting: Oncology

## 2014-04-22 VITALS — BP 141/77 | HR 67 | Temp 96.8°F | Resp 18 | Ht 74.0 in | Wt 239.1 lb

## 2014-04-22 DIAGNOSIS — E291 Testicular hypofunction: Secondary | ICD-10-CM

## 2014-04-22 DIAGNOSIS — C61 Malignant neoplasm of prostate: Secondary | ICD-10-CM

## 2014-04-22 MED ORDER — ENZALUTAMIDE 40 MG PO CAPS: 160.0000 mg | ORAL_CAPSULE | Freq: Every day | ORAL | Status: DC

## 2014-04-22 NOTE — Progress Notes (Signed)
Hematology and Oncology Follow Up Visit  [name] [id number] 1946/02/26 69 y.o. 04/22/2014 9:49 AM   Principle Diagnosis: 69 year old gentleman with prostate cancer diagnosed in 2003. He had a Gleason score 4 + 3 equals 7. PSA 6.45.   Prior Therapy:  1. He is status post a prostatectomy and lymph node dissection done on November 05, 2001. He had a Gleason score 4 + 3 equals 7. He had  focal extracapsular extension, both seminal vesicles were involved indicating his stage was T3b N0.  2. The patient received a total of 6480 cGy of radiation therapy in 36 fractions between February and April 2005.  3. The patient had a rise in August of 2011 up to 35 and he was started on Lupron. His PSA nadir down to 4.11. Most recently his PSA was up to 5.91 in December of 2012 with a testosterone level of 35. His staging workup did not reveal any bony metastasis. His his PSA went up to 10 in 04/2011.  4. Casodex was added to Lupron on 05/2011. This was discontinued in January 2016 due to progression of disease.  Current therapy: Xtandi to be started in the near future.  Interim History: [name] presents today for a followup visit by him self. Since his last visit, he ports no new issues. He stopped Casodex without any new complications. He continues to be completely asymptomatic from his cancer. He is not reporting any abdominal pain. He has not reported any genitourinary complaints.he does not report any bone pain at this time including back pain or shoulder pain. Performance status and activity level remains unchanged. No recent illnesses or hospitalizations. 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  He has not reported any headaches or blurry vision or syncope. Has not reported any chest pain breath. Has not reported any palpitation or leg edema. He does not report any wheezing, cough or hemoptysis. He did not report any nausea or vomiting or abdominal pain. He  has not reported any lymphadenopathy or petechiae. Rest of the review of systems unremarkable.   Medications: I have reviewed the patient's current medications.  Current Outpatient Prescriptions  Medication Sig Dispense Refill  . ALPRAZolam (XANAX) 0.5 MG tablet Take 0.5 mg by mouth 3 (three) times daily as needed for sleep.    Marland Kitchen Leuprolide Acetate (LUPRON DEPOT IM) Inject into the muscle every 4 (four) months.    . warfarin (COUMADIN) 5 MG tablet Take 5 mg by mouth daily. 7.5 mg or 10 mg    . enzalutamide (XTANDI) 40 MG capsule Take 4 capsules (160 mg total) by mouth daily. 120 capsule 0   No current facility-administered medications for this visit.    Allergies:  Allergies  Allergen Reactions  . Penicillins     Past Medical History, Surgical history, Social history, and Family History were reviewed and updated.  Physical Exam: Blood pressure 141/77, pulse 67, temperature 96.8 F (36 C), temperature source Oral, resp. rate 18, height 6\' 2"  (1.88 m), weight 239 lb 1.6 oz (108.455 kg). ECOG: 1 General appearance: alert awake not in any distress. Head: Normocephalic, without obvious abnormality Neck: no adenopathy Lymph nodes: Cervical, supraclavicular, and axillary nodes normal. Heart:regular rate and rhythm, S1, S2 normal, no murmur, click, rub or gallop Lung:chest clear, no wheezing, rales, normal symmetric air entry Abdomin: soft, non-tender, without masses or organomegaly EXT:no erythema, induration, or nodules   Lab Results: Lab Results  Component Value Date   WBC 6.9  04/20/2014   HGB 13.6 04/20/2014   HCT 40.6 04/20/2014   MCV 91.0 04/20/2014   PLT 200 04/20/2014     Chemistry      Component Value Date/Time   NA 142 04/20/2014 1315   NA 140 09/18/2011 0915   K 4.4 04/20/2014 1315   K 4.7 09/18/2011 0915   CL 104 06/11/2012 0817   CL 104 09/18/2011 0915   CO2 28 04/20/2014 1315   CO2 31 09/18/2011 0915   BUN 16.5 04/20/2014 1315   BUN 14 09/18/2011 0915    CREATININE 0.9 04/20/2014 1315   CREATININE 0.93 09/18/2011 0915      Component Value Date/Time   CALCIUM 9.1 04/20/2014 1315   CALCIUM 9.1 09/18/2011 0915   ALKPHOS 49 04/20/2014 1315   ALKPHOS 41 09/18/2011 0915   AST 19 04/20/2014 1315   AST 23 09/18/2011 0915   ALT 14 04/20/2014 1315   ALT 25 09/18/2011 0915   BILITOT 0.36 04/20/2014 1315   BILITOT 0.5 09/18/2011 0915       Results for Matusik, [name] (MRN [id number]) as of 04/22/2014 09:51  Ref. Range 12/01/2013 08:48 03/02/2014 08:44 04/20/2014 13:15  PSA Latest Range: <=4.00 ng/mL 7.91 (H) 12.65 (H) 37.26 (H)     Impression and Plan:  1. This is a pleasant 69 year old gentleman with prostate cancer. 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 Casodex withdrawal between January 2016 and February 2016 did not yield any benefit. His PSA went up from 12.65 up to 37. CT scan chest abdomen and pelvis done on 04/20/2014 was reviewed with radiology although the official report is not out did not show any evidence of metastatic disease. Options of treatment were discussed today and is agreeable to proceed with Xtandi. Complications from this medication were reviewed again including fatigue, tiredness, leg edema and rarely seizures. He is agreeable to proceed and we will check his PSA and liver function tests in one month.  2. Androgen deprivation. He continues to be on Lupron under the care of Dr. Risa Grill.   3. Bony disease. His bone scan done in 06/01/2013 was normal.  4. Follow up: in 4 weeks after starting xtandi to assess for any complications.    [facility], MD 2/25/20169:49 AM

## 2014-04-22 NOTE — Telephone Encounter (Signed)
Pt confirmed labs/ov per 02/25 POF, gave pt AVS.... KJ °

## 2014-04-23 ENCOUNTER — Encounter: Payer: Self-pay | Admitting: Oncology

## 2014-04-23 ENCOUNTER — Other Ambulatory Visit: Payer: Self-pay | Admitting: *Deleted

## 2014-04-23 ENCOUNTER — Telehealth: Payer: Self-pay | Admitting: Oncology

## 2014-04-23 ENCOUNTER — Other Ambulatory Visit: Payer: Self-pay | Admitting: Oncology

## 2014-04-23 MED ORDER — ENZALUTAMIDE 40 MG PO CAPS: 160.0000 mg | ORAL_CAPSULE | Freq: Every day | ORAL | Status: DC

## 2014-04-23 NOTE — Progress Notes (Signed)
Faxed pre-auth for xtandi 40mg  to [employer]  120 take 4 capsules my mouth daily.

## 2014-04-23 NOTE — Telephone Encounter (Signed)
pt called this morning and lmonvm inquiring about a prescription that was supposed to be sent to [facility] for him. vm forwarded to triage line and staff message sent to [place] and copied to [facility] and his desk nurse.

## 2014-04-26 ENCOUNTER — Telehealth: Payer: Self-pay | Admitting: *Deleted

## 2014-04-26 NOTE — Telephone Encounter (Signed)
XTANDI HAS BEEN DENIED. A LETTER WILL BE RECEIVED VIA MAIL FOR APPEALS.

## 2014-04-26 NOTE — Telephone Encounter (Signed)
Voicemail left requesting status of prescription at [facility] after visit on yesterday.  Noted Gillermina Phy order was sent on 12-22-2014 at 58 am.  Called Mr. Chimenti apologetic about delay in return call.  He is now awaiting prior authorization from [facility].  Process is to await responsw from [facility] and will notify he and the pharmacy of the decision.  Hopes he can afford the medication was expressed.

## 2014-04-26 NOTE — Telephone Encounter (Addendum)
Mr. Biondolillo just received "call from [facility].  The Gillermina Phy has been rejected.  Was told the request does not mention his cancer is metastatic is why it was rejected.  I know he's prescribed this for other people and don't understand why.  It needs to be resent and worded differently.  [employer] informed him they will notify his provider."

## 2014-04-27 ENCOUNTER — Telehealth: Payer: Self-pay | Admitting: *Deleted

## 2014-04-27 ENCOUNTER — Encounter: Payer: Self-pay | Admitting: Oncology

## 2014-04-27 NOTE — Progress Notes (Signed)
Resent pre auth to [facility] for patient Xtandi 40 mg capsule  Take 4 capsules by mouth daily.

## 2014-04-27 NOTE — Telephone Encounter (Signed)
Spoke with patient, let him know we are trying to get his medication approved. Gave paperwork to raquel, financial advocate and she will re-submit it.

## 2014-04-29 ENCOUNTER — Encounter: Payer: Self-pay | Admitting: *Deleted

## 2014-04-29 ENCOUNTER — Telehealth: Payer: Self-pay | Admitting: *Deleted

## 2014-04-29 NOTE — Progress Notes (Signed)
Called and left message with raquel, financial advocate.  Asking if any news on patient's xtandi that was re-submitted for prior authorization?

## 2014-04-29 NOTE — Telephone Encounter (Signed)
[employer] called requesting clinical documentation so they can approve Xtandi.Dr Alen Blew last office note faxed to [facility]

## 2014-05-03 ENCOUNTER — Telehealth: Payer: Self-pay | Admitting: *Deleted

## 2014-05-03 ENCOUNTER — Encounter: Payer: Self-pay | Admitting: Oncology

## 2014-05-03 ENCOUNTER — Other Ambulatory Visit: Payer: Self-pay | Admitting: Oncology

## 2014-05-03 ENCOUNTER — Encounter: Payer: Self-pay | Admitting: *Deleted

## 2014-05-03 MED ORDER — ABIRATERONE ACETATE 250 MG PO TABS: 1000.0000 mg | ORAL_TABLET | Freq: Every day | ORAL | Status: DC

## 2014-05-03 NOTE — Progress Notes (Signed)
Faxed zytiga pa form to [employer]

## 2014-05-03 NOTE — Telephone Encounter (Signed)
Mr. Hicklin called to say that he heard from [facility] on Friday (04/30/14) regarding his requested for expedited review of approval of Xtandi.  He was told by [facility] that they will not approve the drug because they cannot determine, through the documentation we presented, that he has metastatic disease.  He has been without treatment for 7 weeks now and is becoming alarmed.  He wants to know what the next step is.  Please advise.

## 2014-05-03 NOTE — Progress Notes (Signed)
I discussed with the patient today the recent findings including his rejected coverage of xtandi. Options of treatment were discussed today including Zytiga versus ketoconazole. The risks and benefits of both of those drugs were discussed today over the phone. Complications that includes fatigue, tiredness, hypokalemia, adrenal insufficiency were discussed. He is agreeable to try Zytiga at this time with 5 mg of prednisone orally. Complications of but does also discussed which I feel there will probably be less problematic given the low dose prednisone used.  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

## 2014-05-03 NOTE — Telephone Encounter (Signed)
Left message on patient identified voice mail, that dr Alen Blew has written a script for Zytiga. Script was  submitted to raquel in managed care,  to be submitted to your ins co for authorization.

## 2014-05-03 NOTE — Progress Notes (Signed)
Script for zytiga given to ebony in managed care.

## 2014-05-11 ENCOUNTER — Encounter: Payer: Self-pay | Admitting: Oncology

## 2014-05-11 NOTE — Progress Notes (Signed)
I faxed prior-auth to [employer]cover[employer] again for Zytiga 250mg  tablet  120/30  Take 4 tablets by mouth daily on empty stomach 1 hour before or 2 hours after meal

## 2014-05-11 NOTE — Progress Notes (Signed)
Per [employer] must go thru [employer] for pre-auth for Zytiga. I faxed to  [phone].

## 2014-05-12 ENCOUNTER — Telehealth: Payer: Self-pay | Admitting: *Deleted

## 2014-05-12 NOTE — Telephone Encounter (Signed)
Pt came to lobby again asking if he needed prednisone with the Zytiga, checked with Dixie. She was aware of earlier conversation with Raoul Pitch, rn. When pt picked up zytiga the pharmacist said prednisone would be at doctor's discretion so he came to clarify. Told him MD did not prescribe prednisone.

## 2014-05-12 NOTE — Telephone Encounter (Signed)
Patient called to ask if he should take prednisone with Zytiga.  Advised him that he should not.

## 2014-05-25 ENCOUNTER — Other Ambulatory Visit (HOSPITAL_BASED_OUTPATIENT_CLINIC_OR_DEPARTMENT_OTHER): Payer: Medicare Other

## 2014-05-25 DIAGNOSIS — C61 Malignant neoplasm of prostate: Secondary | ICD-10-CM

## 2014-05-25 LAB — CBC WITH DIFFERENTIAL/PLATELET
BASO%: 0.3 % (ref 0.0–2.0)
Basophils Absolute: 0 10*3/uL (ref 0.0–0.1)
EOS ABS: 0.3 10*3/uL (ref 0.0–0.5)
EOS%: 3.8 % (ref 0.0–7.0)
HCT: 39.2 % (ref 38.4–49.9)
HGB: 12.8 g/dL — ABNORMAL LOW (ref 13.0–17.1)
LYMPH%: 20.8 % (ref 14.0–49.0)
MCH: 29.8 pg (ref 27.2–33.4)
MCHC: 32.7 g/dL (ref 32.0–36.0)
MCV: 91.1 fL (ref 79.3–98.0)
MONO#: 0.3 10*3/uL (ref 0.1–0.9)
MONO%: 4.2 % (ref 0.0–14.0)
NEUT#: 5.8 10*3/uL (ref 1.5–6.5)
NEUT%: 70.9 % (ref 39.0–75.0)
PLATELETS: 202 10*3/uL (ref 140–400)
RBC: 4.3 10*6/uL (ref 4.20–5.82)
RDW: 13.4 % (ref 11.0–14.6)
WBC: 8.1 10*3/uL (ref 4.0–10.3)
lymph#: 1.7 10*3/uL (ref 0.9–3.3)

## 2014-05-25 LAB — COMPREHENSIVE METABOLIC PANEL (CC13)
ALBUMIN: 3.3 g/dL — AB (ref 3.5–5.0)
ALT: 44 U/L (ref 0–55)
ANION GAP: 7 meq/L (ref 3–11)
AST: 31 U/L (ref 5–34)
Alkaline Phosphatase: 52 U/L (ref 40–150)
BUN: 11.1 mg/dL (ref 7.0–26.0)
CHLORIDE: 107 meq/L (ref 98–109)
CO2: 26 mEq/L (ref 22–29)
CREATININE: 0.9 mg/dL (ref 0.7–1.3)
Calcium: 8.8 mg/dL (ref 8.4–10.4)
EGFR: 88 mL/min/{1.73_m2} — ABNORMAL LOW (ref >90)
Glucose: 122 mg/dl (ref 70–140)
Potassium: 4 mEq/L (ref 3.5–5.1)
Sodium: 140 mEq/L (ref 136–145)
TOTAL PROTEIN: 6.6 g/dL (ref 6.4–8.3)
Total Bilirubin: 0.76 mg/dL (ref 0.20–1.20)

## 2014-05-26 LAB — PSA: PSA: 10.71 ng/mL — AB (ref <=4.00)

## 2014-05-27 ENCOUNTER — Ambulatory Visit (HOSPITAL_BASED_OUTPATIENT_CLINIC_OR_DEPARTMENT_OTHER): Payer: Medicare Other | Admitting: Oncology

## 2014-05-27 ENCOUNTER — Telehealth: Payer: Self-pay | Admitting: Oncology

## 2014-05-27 VITALS — BP 145/79 | HR 69 | Temp 97.7°F | Resp 19 | Ht 74.0 in | Wt 237.7 lb

## 2014-05-27 DIAGNOSIS — E291 Testicular hypofunction: Secondary | ICD-10-CM | POA: Diagnosis not present

## 2014-05-27 DIAGNOSIS — C61 Malignant neoplasm of prostate: Secondary | ICD-10-CM

## 2014-05-27 NOTE — Progress Notes (Signed)
Hematology and Oncology Follow Up Visit  [name] [id number] [birth year] 69 y.o. 05/27/2014 10:36 AM   Principle Diagnosis: 69 year old gentleman with prostate cancer diagnosed in 2003. He had a Gleason score 4 + 3 equals 7. PSA 6.45.   Prior Therapy:  1. He is status post a prostatectomy and lymph node dissection done on November 05, 2001. He had a Gleason score 4 + 3 equals 7. He had  focal extracapsular extension, both seminal vesicles were involved indicating his stage was T3b N0.  2. The patient received a total of 6480 cGy of radiation therapy in 36 fractions between February and April 2005.  3. The patient had a rise in August of 2011 up to 35 and he was started on Lupron. His PSA nadir down to 4.11. Most recently his PSA was up to 5.91 in December of 2012 with a testosterone level of 35. His staging workup did not reveal any bony metastasis. His his PSA went up to 10 in 04/2011.  4. Casodex was added to Lupron on 05/2011. This was discontinued in January 2016 due to progression of disease.  Current therapy: Zytiga 1000 mg daily started on 05/13/2014.  Interim History: Mr. Rominger presents today for a followup visit by him self. Since his last visit, he started Zytiga without any complications. Gillermina Phy was not covered by his insurance and [name] was started instead. He did not report any nausea, lower extremity swelling, decrease in his appetite or decline in his performance status. He continues to be completely asymptomatic from his cancer. He is not reporting any abdominal pain. He has not reported any genitourinary complaints.he does not report any bone pain at this time including back pain or shoulder pain. No recent illnesses or hospitalizations. 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  He has not reported any headaches or blurry vision or syncope. Has not reported any chest pain breath. Has not reported any palpitation or leg edema. He does  not report any wheezing, cough or hemoptysis. He did not report any nausea or vomiting or abdominal pain. He has not reported any lymphadenopathy or petechiae. Rest of the review of systems unremarkable.   Medications: I have reviewed the patient's current medications.  Current Outpatient Prescriptions  Medication Sig Dispense Refill  . abiraterone Acetate (ZYTIGA) 250 MG tablet Take 4 tablets (1,000 mg total) by mouth daily. Take on an empty stomach 1 hour before or 2 hours after a meal 120 tablet 0  . ALPRAZolam (XANAX) 0.5 MG tablet Take 0.5 mg by mouth 3 (three) times daily as needed for sleep.    Marland Kitchen Leuprolide Acetate (LUPRON DEPOT IM) Inject into the muscle every 4 (four) months.    . warfarin (COUMADIN) 5 MG tablet Take 5 mg by mouth daily. 7.5 mg or 10 mg     No current facility-administered medications for this visit.    Allergies:  Allergies  Allergen Reactions  . Penicillins     Past Medical History, Surgical history, Social history, and Family History were reviewed and updated.  Physical Exam: Blood pressure 145/79, pulse 69, temperature 97.7 F (36.5 C), temperature source Oral, resp. rate 19, height 6\' 2"  (1.88 m), weight 237 lb 11.2 oz (107.82 kg), SpO2 100 %. ECOG: 1 General appearance: alert awake not in any distress. Head: Normocephalic, without obvious abnormality Neck: no adenopathy Lymph nodes: Cervical, supraclavicular, and axillary nodes normal. Heart:regular rate and rhythm, S1, S2 normal, no murmur, click, rub or gallop  Lung:chest clear, no wheezing, rales, normal symmetric air entry Abdomin: soft, non-tender, without masses or organomegaly EXT:no erythema, induration, or nodules Skin: No erythema or rashes.  Lab Results: Lab Results  Component Value Date   WBC 8.1 05/25/2014   HGB 12.8* 05/25/2014   HCT 39.2 05/25/2014   MCV 91.1 05/25/2014   PLT 202 05/25/2014     Chemistry      Component Value Date/Time   NA 140 05/25/2014 0758   NA 140  09/18/2011 0915   K 4.0 05/25/2014 0758   K 4.7 09/18/2011 0915   CL 104 06/11/2012 0817   CL 104 09/18/2011 0915   CO2 26 05/25/2014 0758   CO2 31 09/18/2011 0915   BUN 11.1 05/25/2014 0758   BUN 14 09/18/2011 0915   CREATININE 0.9 05/25/2014 0758   CREATININE 0.93 09/18/2011 0915      Component Value Date/Time   CALCIUM 8.8 05/25/2014 0758   CALCIUM 9.1 09/18/2011 0915   ALKPHOS 52 05/25/2014 0758   ALKPHOS 41 09/18/2011 0915   AST 31 05/25/2014 0758   AST 23 09/18/2011 0915   ALT 44 05/25/2014 0758   ALT 25 09/18/2011 0915   BILITOT 0.76 05/25/2014 0758   BILITOT 0.5 09/18/2011 0915      Results for Bookbinder, EDWARDS MCKELVIE (MRN [id number]) as of 05/27/2014 10:38  Ref. Range 04/20/2014 13:15 05/25/2014 07:58  PSA Latest Range: <=4.00 ng/mL 37.26 (H) 10.71 (H)       Impression and Plan:  1. This is a pleasant 68 year old gentleman with prostate cancer. 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 Casodex withdrawal between January 2016 and February 2016 did not yield any benefit.   He started Zytiga on 05/13/2014 without any complications. He tolerated it well with his PSA dropping down from 37-10.7. His electrolytes are within normal range as well as his liver function test. The plan is to continue with the same dose and schedule without any modification. We will consider adding prednisone if there is any side effects or outright imbalance in the future.  2. Androgen deprivation. He continues to be on Lupron under the care of Dr. Risa Grill.   3. Bony disease. His bone scan done in 06/01/2013 was normal.  4. Follow up: in 6 weeks to assess for any new complications.   [facility], MD 3/31/201610:36 AM

## 2014-05-27 NOTE — Telephone Encounter (Signed)
Gave avs & calendar for May. °

## 2014-06-07 ENCOUNTER — Other Ambulatory Visit: Payer: Self-pay | Admitting: Oncology

## 2014-07-08 ENCOUNTER — Other Ambulatory Visit: Payer: Self-pay | Admitting: Oncology

## 2014-07-13 ENCOUNTER — Other Ambulatory Visit (HOSPITAL_BASED_OUTPATIENT_CLINIC_OR_DEPARTMENT_OTHER): Payer: Medicare Other

## 2014-07-13 DIAGNOSIS — C61 Malignant neoplasm of prostate: Secondary | ICD-10-CM

## 2014-07-13 LAB — CBC WITH DIFFERENTIAL/PLATELET
BASO%: 0.3 % (ref 0.0–2.0)
BASOS ABS: 0 10*3/uL (ref 0.0–0.1)
EOS%: 5.8 % (ref 0.0–7.0)
Eosinophils Absolute: 0.4 10*3/uL (ref 0.0–0.5)
HCT: 40.1 % (ref 38.4–49.9)
HEMOGLOBIN: 13.4 g/dL (ref 13.0–17.1)
LYMPH%: 34.4 % (ref 14.0–49.0)
MCH: 30.3 pg (ref 27.2–33.4)
MCHC: 33.4 g/dL (ref 32.0–36.0)
MCV: 90.7 fL (ref 79.3–98.0)
MONO#: 0.4 10*3/uL (ref 0.1–0.9)
MONO%: 6 % (ref 0.0–14.0)
NEUT#: 3.2 10*3/uL (ref 1.5–6.5)
NEUT%: 53.5 % (ref 39.0–75.0)
Platelets: 189 10*3/uL (ref 140–400)
RBC: 4.42 10*6/uL (ref 4.20–5.82)
RDW: 13 % (ref 11.0–14.6)
WBC: 6 10*3/uL (ref 4.0–10.3)
lymph#: 2.1 10*3/uL (ref 0.9–3.3)

## 2014-07-13 LAB — COMPREHENSIVE METABOLIC PANEL (CC13)
ALBUMIN: 3.3 g/dL — AB (ref 3.5–5.0)
ALT: 23 U/L (ref 0–55)
AST: 22 U/L (ref 5–34)
Alkaline Phosphatase: 59 U/L (ref 40–150)
Anion Gap: 10 mEq/L (ref 3–11)
BUN: 12.5 mg/dL (ref 7.0–26.0)
CHLORIDE: 106 meq/L (ref 98–109)
CO2: 27 mEq/L (ref 22–29)
Calcium: 8.9 mg/dL (ref 8.4–10.4)
Creatinine: 0.9 mg/dL (ref 0.7–1.3)
EGFR: 90 mL/min/{1.73_m2} — ABNORMAL LOW (ref >90)
Glucose: 108 mg/dl (ref 70–140)
POTASSIUM: 4.1 meq/L (ref 3.5–5.1)
Sodium: 143 mEq/L (ref 136–145)
TOTAL PROTEIN: 6.6 g/dL (ref 6.4–8.3)
Total Bilirubin: 0.67 mg/dL (ref 0.20–1.20)

## 2014-07-14 LAB — PSA: PSA: 2.48 ng/mL (ref <=4.00)

## 2014-07-15 ENCOUNTER — Ambulatory Visit (HOSPITAL_BASED_OUTPATIENT_CLINIC_OR_DEPARTMENT_OTHER): Payer: Medicare Other | Admitting: Oncology

## 2014-07-15 ENCOUNTER — Telehealth: Payer: Self-pay | Admitting: Oncology

## 2014-07-15 VITALS — BP 143/77 | HR 63 | Temp 97.5°F | Resp 20 | Ht 74.0 in | Wt 237.7 lb

## 2014-07-15 DIAGNOSIS — C61 Malignant neoplasm of prostate: Secondary | ICD-10-CM | POA: Diagnosis not present

## 2014-07-15 DIAGNOSIS — E291 Testicular hypofunction: Secondary | ICD-10-CM | POA: Diagnosis not present

## 2014-07-15 NOTE — Telephone Encounter (Signed)
Gave adn printed appt sched and avs fo rpt for [employer]

## 2014-07-15 NOTE — Progress Notes (Signed)
Hematology and Oncology Follow Up Visit  [name] [id number] 10-17-1945 69 y.o. 07/15/2014 12:09 PM   Principle Diagnosis: 69 year old gentleman with prostate cancer diagnosed in 2003. He had a Gleason score 4 + 3 equals 7. PSA 6.45.   Prior Therapy:  1. He is status post a prostatectomy and lymph node dissection done on November 05, 2001. He had a Gleason score 4 + 3 equals 7. He had  focal extracapsular extension, both seminal vesicles were involved indicating his stage was T3b N0.  2. The patient received a total of 6480 cGy of radiation therapy in 36 fractions between February and April 2005.  3. The patient had a rise in August of 2011 up to 35 and he was started on Lupron. His PSA nadir down to 4.11. Most recently his PSA was up to 5.91 in December of 2012 with a testosterone level of 35. His staging workup did not reveal any bony metastasis. His his PSA went up to 10 in 04/2011.  4. Casodex was added to Lupron on 05/2011. This was discontinued in January 2016 due to progression of disease.  Current therapy: Zytiga 1000 mg daily started on 05/13/2014.  Interim History: Mr. Kray presents today for a followup visit by him self. Since his last visit, he continues to do very well. He reports no complications related to Zytiga.Marland Kitchen He did not report any nausea, lower extremity swelling, decrease in his appetite or decline in his performance status. 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 He has reported some slower and recovery time between performances but otherwise no complaints. He is not reporting any abdominal pain. He has not reported any genitourinary complaints.he does not report any bone pain at this time including back pain or shoulder pain. No recent illnesses or hospitalizations.   He has not reported any headaches or blurry vision or syncope. Has not reported any chest pain breath. Has not reported any palpitation or leg edema. He does not  report any wheezing, cough or hemoptysis. He did not report any nausea or vomiting or abdominal pain. He has not reported any lymphadenopathy or petechiae. Rest of the review of systems unremarkable.   Medications: I have reviewed the patient's current medications.  Current Outpatient Prescriptions  Medication Sig Dispense Refill  . ALPRAZolam (XANAX) 0.5 MG tablet Take 0.5 mg by mouth 3 (three) times daily as needed for sleep.    Marland Kitchen Leuprolide Acetate (LUPRON DEPOT IM) Inject into the muscle every 4 (four) months.    . warfarin (COUMADIN) 5 MG tablet Take 5 mg by mouth daily. 7.5 mg or 10 mg    . ZYTIGA 250 MG tablet TAKE 4 TABLETS BY MOUTH DAILY ON AN EMPTY STOMACH 1 HOUR BEFORE OR 2 HOURS AFTER A MEAL 120 tablet 0   No current facility-administered medications for this visit.    Allergies:  Allergies  Allergen Reactions  . Penicillins     Past Medical History, Surgical history, Social history, and Family History were reviewed and updated.  Physical Exam: Blood pressure 143/77, pulse 63, temperature 97.5 F (36.4 C), temperature source Oral, resp. rate 20, height 6\' 2"  (1.88 m), weight 237 lb 11.2 oz (107.82 kg), SpO2 100 %. ECOG: 1 General appearance: alert awake not in any distress. Head: Normocephalic, without obvious abnormality Neck: no adenopathy Lymph nodes: Cervical, supraclavicular, and axillary nodes normal. Heart:regular rate and rhythm, S1, S2 normal, no murmur, click, rub or gallop Lung:chest clear, no wheezing, rales, normal  symmetric air entry Abdomin: soft, non-tender, without masses or organomegaly EXT:no erythema, induration, or nodules Skin: No erythema or rashes.  Lab Results: Lab Results  Component Value Date   WBC 6.0 07/13/2014   HGB 13.4 07/13/2014   HCT 40.1 07/13/2014   MCV 90.7 07/13/2014   PLT 189 07/13/2014     Chemistry      Component Value Date/Time   NA 143 07/13/2014 0825   NA 140 09/18/2011 0915   K 4.1 07/13/2014 0825   K 4.7  09/18/2011 0915   CL 104 06/11/2012 0817   CL 104 09/18/2011 0915   CO2 27 07/13/2014 0825   CO2 31 09/18/2011 0915   BUN 12.5 07/13/2014 0825   BUN 14 09/18/2011 0915   CREATININE 0.9 07/13/2014 0825   CREATININE 0.93 09/18/2011 0915      Component Value Date/Time   CALCIUM 8.9 07/13/2014 0825   CALCIUM 9.1 09/18/2011 0915   ALKPHOS 59 07/13/2014 0825   ALKPHOS 41 09/18/2011 0915   AST 22 07/13/2014 0825   AST 23 09/18/2011 0915   ALT 23 07/13/2014 0825   ALT 25 09/18/2011 0915   BILITOT 0.67 07/13/2014 0825   BILITOT 0.5 09/18/2011 0915       Results for Brake, JOSEDEJESUS MARCUM (MRN [id number]) as of 07/15/2014 11:58  Ref. Range 04/20/2014 13:15 05/25/2014 07:58 07/13/2014 08:24  PSA Latest Ref Range: <=4.00 ng/mL 37.26 (H) 10.71 (H) 2.48       Impression and Plan:  1. This is a pleasant 69 year old gentleman with prostate cancer. 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 Casodex withdrawal between January 2016 and February 2016 did not yield any benefit.   He started Zytiga on 05/13/2014 without any complications. He tolerated it well with his PSA continues to decline down to 2.48. The plan is to continue on the same dose and schedule.  2. Androgen deprivation. He continues to be on Lupron under the care of Dr. Risa Grill.   3. Bony disease. His bone scan done in 06/01/2013 was normal.  4. Follow up: in 10 weeks to assess for any new complications.   [facility], MD 5/19/201612:09 PM

## 2014-08-05 ENCOUNTER — Other Ambulatory Visit: Payer: Self-pay | Admitting: Oncology

## 2014-09-03 ENCOUNTER — Other Ambulatory Visit: Payer: Self-pay | Admitting: Oncology

## 2014-09-21 ENCOUNTER — Other Ambulatory Visit (HOSPITAL_BASED_OUTPATIENT_CLINIC_OR_DEPARTMENT_OTHER): Payer: Medicare Other

## 2014-09-21 DIAGNOSIS — C61 Malignant neoplasm of prostate: Secondary | ICD-10-CM | POA: Diagnosis not present

## 2014-09-21 LAB — CBC WITH DIFFERENTIAL/PLATELET
BASO%: 0.7 % (ref 0.0–2.0)
BASOS ABS: 0 10*3/uL (ref 0.0–0.1)
EOS%: 4.9 % (ref 0.0–7.0)
Eosinophils Absolute: 0.3 10*3/uL (ref 0.0–0.5)
HCT: 37.5 % — ABNORMAL LOW (ref 38.4–49.9)
HEMOGLOBIN: 12.8 g/dL — AB (ref 13.0–17.1)
LYMPH%: 32.4 % (ref 14.0–49.0)
MCH: 30.4 pg (ref 27.2–33.4)
MCHC: 34.2 g/dL (ref 32.0–36.0)
MCV: 88.9 fL (ref 79.3–98.0)
MONO#: 0.5 10*3/uL (ref 0.1–0.9)
MONO%: 7.5 % (ref 0.0–14.0)
NEUT%: 54.5 % (ref 39.0–75.0)
NEUTROS ABS: 3.3 10*3/uL (ref 1.5–6.5)
Platelets: 197 10*3/uL (ref 140–400)
RBC: 4.22 10*6/uL (ref 4.20–5.82)
RDW: 13.6 % (ref 11.0–14.6)
WBC: 6.1 10*3/uL (ref 4.0–10.3)
lymph#: 2 10*3/uL (ref 0.9–3.3)

## 2014-09-21 LAB — COMPREHENSIVE METABOLIC PANEL (CC13)
ALK PHOS: 61 U/L (ref 40–150)
ALT: 27 U/L (ref 0–55)
ANION GAP: 8 meq/L (ref 3–11)
AST: 24 U/L (ref 5–34)
Albumin: 3.4 g/dL — ABNORMAL LOW (ref 3.5–5.0)
BUN: 11.3 mg/dL (ref 7.0–26.0)
CO2: 25 meq/L (ref 22–29)
Calcium: 8.9 mg/dL (ref 8.4–10.4)
Chloride: 108 mEq/L (ref 98–109)
Creatinine: 0.8 mg/dL (ref 0.7–1.3)
EGFR: >90 mL/min/{1.73_m2} (ref >90)
Glucose: 98 mg/dl (ref 70–140)
POTASSIUM: 3.7 meq/L (ref 3.5–5.1)
Sodium: 141 mEq/L (ref 136–145)
TOTAL PROTEIN: 6.5 g/dL (ref 6.4–8.3)
Total Bilirubin: 0.71 mg/dL (ref 0.20–1.20)

## 2014-09-22 LAB — PSA: PSA: 1.59 ng/mL (ref <=4.00)

## 2014-09-23 ENCOUNTER — Ambulatory Visit (HOSPITAL_BASED_OUTPATIENT_CLINIC_OR_DEPARTMENT_OTHER): Payer: Medicare Other | Admitting: Oncology

## 2014-09-23 ENCOUNTER — Telehealth: Payer: Self-pay | Admitting: Oncology

## 2014-09-23 VITALS — BP 146/76 | HR 68 | Temp 98.3°F | Resp 18 | Ht 74.0 in | Wt 224.7 lb

## 2014-09-23 DIAGNOSIS — Z86718 Personal history of other venous thrombosis and embolism: Secondary | ICD-10-CM | POA: Diagnosis not present

## 2014-09-23 DIAGNOSIS — M899 Disorder of bone, unspecified: Secondary | ICD-10-CM | POA: Diagnosis not present

## 2014-09-23 DIAGNOSIS — C61 Malignant neoplasm of prostate: Secondary | ICD-10-CM

## 2014-09-23 DIAGNOSIS — E291 Testicular hypofunction: Secondary | ICD-10-CM | POA: Diagnosis not present

## 2014-09-23 DIAGNOSIS — Z86711 Personal history of pulmonary embolism: Secondary | ICD-10-CM

## 2014-09-23 NOTE — Progress Notes (Signed)
Hematology and Oncology Follow Up Visit  [name] [id number] Sep 22, 1945 69 y.o. 09/23/2014 9:12 AM   Principle Diagnosis: 69 year old gentleman with prostate cancer diagnosed in 2003. He had a Gleason score 4 + 3 equals 7. PSA 6.45.   Prior Therapy:  1. He is status post a prostatectomy and lymph node dissection done on November 05, 2001. He had a Gleason score 4 + 3 equals 7. He had  focal extracapsular extension, both seminal vesicles were involved indicating his stage was T3b N0.  2. The patient received a total of 6480 cGy of radiation therapy in 36 fractions between February and April 2005.  3. The patient had a rise in August of 2011 up to 35 and he was started on Lupron. His PSA nadir down to 4.11. Most recently his PSA was up to 5.91 in December of 2012 with a testosterone level of 35. His staging workup did not reveal any bony metastasis. His his PSA went up to 10 in 04/2011.  4. Casodex was added to Lupron on 05/2011. This was discontinued in January 2016 due to progression of disease.  Current therapy: Zytiga 1000 mg daily started on 05/13/2014.  Interim History: Mr. Mcclaren presents today for a followup visit. Since his last visit, he reports no new complaints. He reports no complications related to Zytiga. He did not report any nausea, lower extremity swelling, decrease in his appetite or decline in his performance status. 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 He did have some issues regulating his warfarin and his dose had to be increased. Currently he is on the appropriate dose of warfarin with therapeutic INR. He is not reporting any abdominal pain. He has not reported any genitourinary complaints.he does not report any bone pain at this time including back pain or shoulder pain. No recent illnesses or hospitalizations.   He has not reported any headaches or blurry vision or syncope. Has not reported any chest pain breath. Has not reported  any palpitation or leg edema. He does not report any wheezing, cough or hemoptysis. He did not report any nausea or vomiting or abdominal pain. He has not reported any lymphadenopathy or petechiae. Rest of the review of systems unremarkable.   Medications: I have reviewed the patient's current medications.  Current Outpatient Prescriptions  Medication Sig Dispense Refill  . ALPRAZolam (XANAX) 0.5 MG tablet Take 0.5 mg by mouth 3 (three) times daily as needed for sleep.    Marland Kitchen Leuprolide Acetate (LUPRON DEPOT IM) Inject into the muscle every 4 (four) months.    . warfarin (COUMADIN) 5 MG tablet Take 5 mg by mouth daily. 7.5 mg or 10 mg    . ZYTIGA 250 MG tablet TAKE 4 TABLETS BY MOUTH DAILY ON AN EMPTY STOMACH 1 HOUR BEFORE OR 2 HOURS AFTER A MEAL 120 tablet 0   No current facility-administered medications for this visit.    Allergies:  Allergies  Allergen Reactions  . Penicillins     Past Medical History, Surgical history, Social history, and Family History were reviewed and updated.  Physical Exam: Blood pressure 146/76, pulse 68, temperature 98.3 F (36.8 C), temperature source Oral, resp. rate 18, height 6\' 2"  (1.88 m), weight 224 lb 11.2 oz (101.923 kg), SpO2 99 %. ECOG: 1 General appearance: alert awake well appearing.  Head: Normocephalic, without obvious abnormality Neck: no adenopathy Lymph nodes: Cervical, supraclavicular, and axillary nodes normal. Heart:regular rate and rhythm, S1, S2 normal, no murmur, click, rub  or gallop Lung:chest clear, no wheezing, rales, normal symmetric air entry Abdomin: soft, non-tender, without masses or organomegaly EXT:no erythema, induration, or nodules Skin: No erythema or rashes.  Lab Results: Lab Results  Component Value Date   WBC 6.1 09/21/2014   HGB 12.8* 09/21/2014   HCT 37.5* 09/21/2014   MCV 88.9 09/21/2014   PLT 197 09/21/2014     Chemistry      Component Value Date/Time   NA 141 09/21/2014 0810   NA 140 09/18/2011  0915   K 3.7 09/21/2014 0810   K 4.7 09/18/2011 0915   CL 104 06/11/2012 0817   CL 104 09/18/2011 0915   CO2 25 09/21/2014 0810   CO2 31 09/18/2011 0915   BUN 11.3 09/21/2014 0810   BUN 14 09/18/2011 0915   CREATININE 0.8 09/21/2014 0810   CREATININE 0.93 09/18/2011 0915      Component Value Date/Time   CALCIUM 8.9 09/21/2014 0810   CALCIUM 9.1 09/18/2011 0915   ALKPHOS 61 09/21/2014 0810   ALKPHOS 41 09/18/2011 0915   AST 24 09/21/2014 0810   AST 23 09/18/2011 0915   ALT 27 09/21/2014 0810   ALT 25 09/18/2011 0915   BILITOT 0.71 09/21/2014 0810   BILITOT 0.5 09/18/2011 0915        Results for Tuohey, BRYNN REZNIK (MRN [id number]) as of 09/23/2014 08:56  Ref. Range 04/20/2014 13:15 05/25/2014 07:58 07/13/2014 08:24 09/21/2014 08:09  PSA Latest Ref Range: <=4.00 ng/mL 37.26 (H) 10.71 (H) 2.48 1.59       Impression and Plan:  1. This is a pleasant 69 year old gentleman with prostate cancer. 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 Casodex withdrawal between January 2016 and February 2016 did not yield any benefit.   He started Zytiga on 05/13/2014 without any complications. He tolerated it well with his PSA continues to decline and it's at 1.59. The plan is to continue on the same dose and schedule. He has no problem obtaining this medication and agreeable to continue on it.  2. Androgen deprivation. He continues to be on Lupron under the care of Dr. Risa Grill.   3. Bony disease. His bone scan done in 06/01/2013 was normal.  4. 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 No bleeding or thrombosis noted recently.  5. Follow up: in October 2016 for a checkup.   [facility], MD 7/28/20169:12 AM

## 2014-09-23 NOTE — Telephone Encounter (Signed)
Per pof to sch pt appt-gave pt copy of avs

## 2014-10-05 ENCOUNTER — Telehealth: Payer: Self-pay | Admitting: *Deleted

## 2014-10-05 ENCOUNTER — Other Ambulatory Visit: Payer: Self-pay | Admitting: *Deleted

## 2014-10-05 DIAGNOSIS — C61 Malignant neoplasm of prostate: Secondary | ICD-10-CM

## 2014-10-05 MED ORDER — ABIRATERONE ACETATE 250 MG PO TABS: ORAL_TABLET | ORAL | Status: DC

## 2014-10-05 NOTE — Telephone Encounter (Signed)
VM message received this morning @ 9:00am regarding need for refill on pt's Zytiga.  Refill has been sent in. Spoke with pharmacy and it will ready with the hour. Returned call to patient to let him know his medication is ready for him. He voiced understanding.

## 2014-11-03 ENCOUNTER — Telehealth: Payer: Self-pay | Admitting: Oncology

## 2014-11-03 ENCOUNTER — Other Ambulatory Visit: Payer: Self-pay | Admitting: *Deleted

## 2014-11-03 DIAGNOSIS — C61 Malignant neoplasm of prostate: Secondary | ICD-10-CM

## 2014-11-03 MED ORDER — ABIRATERONE ACETATE 250 MG PO TABS: ORAL_TABLET | ORAL | Status: DC

## 2014-11-03 NOTE — Telephone Encounter (Signed)
Patient left message stating he is trying to get his meds refilled - contacted [facility] and they faxed [facility] orders to refill and has not received response. Per patient this was about 5 days agao and he is about our of meds. routed to triage.

## 2014-11-03 NOTE — Telephone Encounter (Signed)
Refilled zytiga script and notified patient.

## 2014-11-03 NOTE — Telephone Encounter (Signed)
Refilled patient's chemotherapy at [facility]. Patient notified

## 2014-11-29 ENCOUNTER — Other Ambulatory Visit: Payer: Self-pay | Admitting: *Deleted

## 2014-11-29 DIAGNOSIS — C61 Malignant neoplasm of prostate: Secondary | ICD-10-CM

## 2014-11-29 MED ORDER — ABIRATERONE ACETATE 250 MG PO TABS: ORAL_TABLET | ORAL | Status: DC

## 2014-11-30 ENCOUNTER — Other Ambulatory Visit (HOSPITAL_BASED_OUTPATIENT_CLINIC_OR_DEPARTMENT_OTHER): Payer: Medicare Other

## 2014-11-30 DIAGNOSIS — C61 Malignant neoplasm of prostate: Secondary | ICD-10-CM | POA: Diagnosis not present

## 2014-11-30 LAB — CBC WITH DIFFERENTIAL/PLATELET
BASO%: 0.6 % (ref 0.0–2.0)
Basophils Absolute: 0 10*3/uL (ref 0.0–0.1)
EOS%: 5.2 % (ref 0.0–7.0)
Eosinophils Absolute: 0.3 10*3/uL (ref 0.0–0.5)
HEMATOCRIT: 39.9 % (ref 38.4–49.9)
HEMOGLOBIN: 13.3 g/dL (ref 13.0–17.1)
LYMPH#: 1.7 10*3/uL (ref 0.9–3.3)
LYMPH%: 32.8 % (ref 14.0–49.0)
MCH: 29.9 pg (ref 27.2–33.4)
MCHC: 33.4 g/dL (ref 32.0–36.0)
MCV: 89.6 fL (ref 79.3–98.0)
MONO#: 0.3 10*3/uL (ref 0.1–0.9)
MONO%: 6.9 % (ref 0.0–14.0)
NEUT%: 54.5 % (ref 39.0–75.0)
NEUTROS ABS: 2.8 10*3/uL (ref 1.5–6.5)
Platelets: 207 10*3/uL (ref 140–400)
RBC: 4.46 10*6/uL (ref 4.20–5.82)
RDW: 13.5 % (ref 11.0–14.6)
WBC: 5.1 10*3/uL (ref 4.0–10.3)

## 2014-11-30 LAB — COMPREHENSIVE METABOLIC PANEL (CC13)
ALBUMIN: 3.5 g/dL (ref 3.5–5.0)
ALK PHOS: 63 U/L (ref 40–150)
ALT: 27 U/L (ref 0–55)
AST: 24 U/L (ref 5–34)
Anion Gap: 7 mEq/L (ref 3–11)
BUN: 12.5 mg/dL (ref 7.0–26.0)
CO2: 26 mEq/L (ref 22–29)
CREATININE: 0.8 mg/dL (ref 0.7–1.3)
Calcium: 9.2 mg/dL (ref 8.4–10.4)
Chloride: 109 mEq/L (ref 98–109)
EGFR: >90 mL/min/{1.73_m2} (ref >90)
Glucose: 98 mg/dl (ref 70–140)
Potassium: 3.7 mEq/L (ref 3.5–5.1)
Sodium: 142 mEq/L (ref 136–145)
Total Bilirubin: 0.76 mg/dL (ref 0.20–1.20)
Total Protein: 6.8 g/dL (ref 6.4–8.3)

## 2014-12-01 LAB — PSA: PSA: 1.42 ng/mL (ref <=4.00)

## 2014-12-02 ENCOUNTER — Telehealth: Payer: Self-pay | Admitting: Oncology

## 2014-12-02 ENCOUNTER — Ambulatory Visit (HOSPITAL_BASED_OUTPATIENT_CLINIC_OR_DEPARTMENT_OTHER): Payer: Medicare Other | Admitting: Oncology

## 2014-12-02 VITALS — BP 164/80 | HR 73 | Temp 97.5°F | Resp 20 | Ht 74.0 in | Wt 222.9 lb

## 2014-12-02 DIAGNOSIS — I10 Essential (primary) hypertension: Secondary | ICD-10-CM | POA: Diagnosis not present

## 2014-12-02 DIAGNOSIS — E291 Testicular hypofunction: Secondary | ICD-10-CM

## 2014-12-02 DIAGNOSIS — C61 Malignant neoplasm of prostate: Secondary | ICD-10-CM | POA: Diagnosis not present

## 2014-12-02 DIAGNOSIS — Z86711 Personal history of pulmonary embolism: Secondary | ICD-10-CM

## 2014-12-02 DIAGNOSIS — Z86718 Personal history of other venous thrombosis and embolism: Secondary | ICD-10-CM

## 2014-12-02 NOTE — Telephone Encounter (Signed)
Gave and printed appt sched and avs for pt for DEc °

## 2014-12-02 NOTE — Progress Notes (Signed)
Hematology and Oncology Follow Up Visit  [name] [id number] 17-Sep-1945 69 y.o. 12/02/2014 9:33 AM   Principle Diagnosis: 69 year old gentleman with prostate cancer diagnosed in 2003. He had a Gleason score 4 + 3 equals 7. PSA 6.45.   Prior Therapy:  1. He is status post a prostatectomy and lymph node dissection done on November 05, 2001. He had a Gleason score 4 + 3 equals 7. He had  focal extracapsular extension, both seminal vesicles were involved indicating his stage was T3b N0.  2. The patient received a total of 6480 cGy of radiation therapy in 36 fractions between February and April 2005.  3. The patient had a rise in August of 2011 up to 35 and he was started on Lupron. His PSA nadir down to 4.11. Most recently his PSA was up to 5.91 in December of 2012 with a testosterone level of 35. His staging workup did not reveal any bony metastasis. His his PSA went up to 10 in 04/2011.  4. Casodex was added to Lupron on 05/2011. This was discontinued in January 2016 due to progression of disease.  Current therapy: Zytiga 1000 mg daily started on 05/13/2014.  Interim History: [name] presents today for a followup visit. Since his last visit, he continues to do well without any new complications. He has not reported any new issue with Zytiga. He did not report any nausea, lower extremity swelling, decrease in his appetite or decline in his performance status. His INR have been regulated properly without any bleeding issues. 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 He is not reporting any abdominal pain. He has not reported any genitourinary complaints.he does not report any bone pain at this time including back pain or shoulder pain. No recent illnesses or hospitalizations.    He has not reported any headaches or blurry vision or syncope. Has not reported any chest pain breath. Has not reported any palpitation or leg edema. He does not report any  wheezing, cough or hemoptysis. He did not report any nausea or vomiting or abdominal pain. He has not reported any lymphadenopathy or petechiae. Rest of the review of systems unremarkable.   Medications: I have reviewed the patient's current medications.  Current Outpatient Prescriptions  Medication Sig Dispense Refill  . abiraterone Acetate (ZYTIGA) 250 MG tablet TAKE 4 TABLETS BY MOUTH DAILY ON AN EMPTY STOMACH 1 HOUR BEFORE OR 2 HOURS AFTER A MEAL 120 tablet 0  . ALPRAZolam (XANAX) 0.5 MG tablet Take 0.5 mg by mouth 3 (three) times daily as needed for sleep.    Marland Kitchen Leuprolide Acetate (LUPRON DEPOT IM) Inject into the muscle every 4 (four) months.    . warfarin (COUMADIN) 5 MG tablet Take 5 mg by mouth daily. 7.5 mg or 10 mg     No current facility-administered medications for this visit.    Allergies:  Allergies  Allergen Reactions  . Penicillins     Past Medical History, Surgical history, Social history, and Family History were reviewed and updated.  Physical Exam: Blood pressure 164/80, pulse 73, temperature 97.5 F (36.4 C), temperature source Oral, resp. rate 20, height 6\' 2"  (1.88 m), weight 222 lb 14.4 oz (101.107 kg), SpO2 100 %. ECOG: 1 General appearance: alert awake gentleman without distress. Head: Normocephalic, without obvious abnormality no oral ulcers or lesions. Neck: no adenopathy Lymph nodes: Cervical, supraclavicular, and axillary nodes normal. Heart:regular rate and rhythm, S1, S2 normal, no murmur, click, rub or gallop Lung:chest  clear, no wheezing, rales, normal symmetric air entry Abdomin: soft, non-tender, without masses or organomegaly EXT:no erythema, induration, or nodules Skin: No erythema or rashes.  Lab Results: Lab Results  Component Value Date   WBC 5.1 11/30/2014   HGB 13.3 11/30/2014   HCT 39.9 11/30/2014   MCV 89.6 11/30/2014   PLT 207 11/30/2014     Chemistry      Component Value Date/Time   NA 142 11/30/2014 0923   NA 140  09/18/2011 0915   K 3.7 11/30/2014 0923   K 4.7 09/18/2011 0915   CL 104 06/11/2012 0817   CL 104 09/18/2011 0915   CO2 26 11/30/2014 0923   CO2 31 09/18/2011 0915   BUN 12.5 11/30/2014 0923   BUN 14 09/18/2011 0915   CREATININE 0.8 11/30/2014 0923   CREATININE 0.93 09/18/2011 0915      Component Value Date/Time   CALCIUM 9.2 11/30/2014 0923   CALCIUM 9.1 09/18/2011 0915   ALKPHOS 63 11/30/2014 0923   ALKPHOS 41 09/18/2011 0915   AST 24 11/30/2014 0923   AST 23 09/18/2011 0915   ALT 27 11/30/2014 0923   ALT 25 09/18/2011 0915   BILITOT 0.76 11/30/2014 0923   BILITOT 0.5 09/18/2011 0915       Results for [name] (MRN [id number]) as of 12/02/2014 09:11  Ref. Range 05/25/2014 07:58 07/13/2014 08:24 09/21/2014 08:09 11/30/2014 09:22  PSA Latest Ref Range: <=4.00 ng/mL 10.71 (H) 2.48 1.59 1.42         Impression and Plan:  1. This is a pleasant 69 year old gentleman with prostate cancer. He has castration resistant disease and biochemical relapse. Casodex withdrawal between January 2016 and February 2016 did not yield any benefit.   He started Zytiga on 05/13/2014 without any complications. His PSA had an excellent response dropping down to 1.42 from 37.26. He has no problems obtaining this medication and taking it regularly. The plan is to continue the same dose and schedule for the time being.   2. Androgen deprivation. He continues to be on Lupron under the care of Dr. Risa Grill.   3. Bony disease. His bone scan done in 06/01/2013 was normal.  4. 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 No bleeding noted since the last visit.  5. Hypertension: His blood pressure have been running a little bit higher which could be an effect of Zytiga. He is not sure if his blood pressure is higher outside of the doctor's office. I have  advised him to obtain an dietary blood pressure monitoring on his own and keep a diary. It is possible that his hypertension is related to [facility] and might require antihypertensives. Also, this hypertension could be related to anxiety and whitecoat syndrome. He will collect blood pressure measurements and bring it to his next appointment and we'll address potentially starting at the hypertensive medications. These options would include hydrochlorothiazide, calcium channel blocker for possible beta blocker. This was discussed with the patient in detail today.  6. Follow up: in December 2016.   EUMPNT,IRWER, MD 10/6/20169:33 AM

## 2014-12-29 ENCOUNTER — Other Ambulatory Visit: Payer: Self-pay | Admitting: Oncology

## 2014-12-29 DIAGNOSIS — C61 Malignant neoplasm of prostate: Secondary | ICD-10-CM

## 2015-01-31 ENCOUNTER — Other Ambulatory Visit: Payer: Self-pay | Admitting: Oncology

## 2015-01-31 ENCOUNTER — Other Ambulatory Visit: Payer: Self-pay | Admitting: *Deleted

## 2015-01-31 MED ORDER — ABIRATERONE ACETATE 250 MG PO TABS: 1000.0000 mg | ORAL_TABLET | Freq: Every day | ORAL | Status: DC

## 2015-02-22 ENCOUNTER — Other Ambulatory Visit (HOSPITAL_BASED_OUTPATIENT_CLINIC_OR_DEPARTMENT_OTHER): Payer: Medicare Other

## 2015-02-22 DIAGNOSIS — C61 Malignant neoplasm of prostate: Secondary | ICD-10-CM | POA: Diagnosis not present

## 2015-02-22 LAB — COMPREHENSIVE METABOLIC PANEL
ALT: 19 U/L (ref 0–55)
AST: 21 U/L (ref 5–34)
Albumin: 3.3 g/dL — ABNORMAL LOW (ref 3.5–5.0)
Alkaline Phosphatase: 55 U/L (ref 40–150)
Anion Gap: 7 mEq/L (ref 3–11)
BUN: 13.2 mg/dL (ref 7.0–26.0)
CHLORIDE: 108 meq/L (ref 98–109)
CO2: 26 meq/L (ref 22–29)
Calcium: 9 mg/dL (ref 8.4–10.4)
Creatinine: 0.9 mg/dL (ref 0.7–1.3)
EGFR: 88 mL/min/{1.73_m2} — AB (ref >90)
GLUCOSE: 100 mg/dL (ref 70–140)
POTASSIUM: 3.8 meq/L (ref 3.5–5.1)
SODIUM: 141 meq/L (ref 136–145)
TOTAL PROTEIN: 6.7 g/dL (ref 6.4–8.3)
Total Bilirubin: 0.72 mg/dL (ref 0.20–1.20)

## 2015-02-22 LAB — CBC WITH DIFFERENTIAL/PLATELET
BASO%: 0.5 % (ref 0.0–2.0)
Basophils Absolute: 0 10*3/uL (ref 0.0–0.1)
EOS ABS: 0.4 10*3/uL (ref 0.0–0.5)
EOS%: 6 % (ref 0.0–7.0)
HCT: 38.9 % (ref 38.4–49.9)
HGB: 13.1 g/dL (ref 13.0–17.1)
LYMPH%: 33.1 % (ref 14.0–49.0)
MCH: 29.9 pg (ref 27.2–33.4)
MCHC: 33.6 g/dL (ref 32.0–36.0)
MCV: 89.2 fL (ref 79.3–98.0)
MONO#: 0.4 10*3/uL (ref 0.1–0.9)
MONO%: 6.9 % (ref 0.0–14.0)
NEUT%: 53.5 % (ref 39.0–75.0)
NEUTROS ABS: 3.2 10*3/uL (ref 1.5–6.5)
Platelets: 192 10*3/uL (ref 140–400)
RBC: 4.36 10*6/uL (ref 4.20–5.82)
RDW: 13.8 % (ref 11.0–14.6)
WBC: 5.9 10*3/uL (ref 4.0–10.3)
lymph#: 2 10*3/uL (ref 0.9–3.3)

## 2015-02-23 LAB — PSA: PSA: 1.29 ng/mL (ref <=4.00)

## 2015-02-24 ENCOUNTER — Telehealth: Payer: Self-pay | Admitting: Oncology

## 2015-02-24 ENCOUNTER — Ambulatory Visit (HOSPITAL_BASED_OUTPATIENT_CLINIC_OR_DEPARTMENT_OTHER): Payer: Medicare Other | Admitting: Oncology

## 2015-02-24 VITALS — BP 170/82 | HR 74 | Temp 97.7°F | Resp 18 | Ht 74.0 in | Wt 219.6 lb

## 2015-02-24 DIAGNOSIS — Z86718 Personal history of other venous thrombosis and embolism: Secondary | ICD-10-CM

## 2015-02-24 DIAGNOSIS — C61 Malignant neoplasm of prostate: Secondary | ICD-10-CM

## 2015-02-24 DIAGNOSIS — E291 Testicular hypofunction: Secondary | ICD-10-CM | POA: Diagnosis not present

## 2015-02-24 DIAGNOSIS — I10 Essential (primary) hypertension: Secondary | ICD-10-CM | POA: Diagnosis not present

## 2015-02-24 DIAGNOSIS — Z86711 Personal history of pulmonary embolism: Secondary | ICD-10-CM

## 2015-02-24 NOTE — Progress Notes (Signed)
Hematology and Oncology Follow Up Visit  [name]:[id number] 1945/06/17 69 y.o. 02/24/2015 9:38 AM   Principle Diagnosis: 69 year old gentleman with prostate cancer diagnosed in 2003. He had a Gleason score 4 + 3 equals 7. PSA 6.45.   Prior Therapy:  1. He is status post a prostatectomy and lymph node dissection done on November 05, 2001. He had a Gleason score 4 + 3 equals 7. He had  focal extracapsular extension, both seminal vesicles were involved indicating his stage was T3b N0.  2. The patient received a total of 6480 cGy of radiation therapy in 36 fractions between February and April 2005.  3. The patient had a rise in August of 2011 up to 35 and he was started on Lupron. His PSA nadir down to 4.11. Most recently his PSA was up to 5.91 in December of 2012 with a testosterone level of 35. His staging workup did not reveal any bony metastasis. His his PSA went up to 10 in 04/2011.  4. Casodex was added to Lupron on 05/2011. This was discontinued in January 2016 due to progression of disease.  Current therapy: Zytiga 1000 mg daily started on 05/13/2014.  Interim History: [name] presents today for a followup visit. Since his last visit, he reports no recent changes in his health. He has not reported any new issue with Zytiga. He did not report any nausea, lower extremity swelling, decrease in his appetite or decline in his performance status. 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  His last performance was in mid December and has been on a break since that time.He is not reporting any abdominal pain. He has not reported any genitourinary complaints.he does not report any bone pain at this time including back pain or shoulder pain.   He continues to be on warfarin and have not had any bleeding or clotting episodes. His levels have been adjusted because of Zytiga and INR have been therapeutic. He also check his blood pressure periodically as primary  care providers office and have been within normal range.   He has not reported any headaches or blurry vision or syncope. Has not reported any chest pain breath. Has not reported any palpitation or leg edema. He does not report any wheezing, cough or hemoptysis. He did not report any nausea or vomiting or abdominal pain. He has not reported any lymphadenopathy or petechiae. Rest of the review of systems unremarkable.   Medications: I have reviewed the patient's current medications.  Current Outpatient Prescriptions  Medication Sig Dispense Refill  . abiraterone Acetate (ZYTIGA) 250 MG tablet Take 4 tablets (1,000 mg total) by mouth daily. Take on an empty stomach 1 hour before or 2 hours after a meal 120 tablet 0  . ALPRAZolam (XANAX) 0.5 MG tablet Take 0.5 mg by mouth 3 (three) times daily as needed for sleep.    Marland Kitchen Leuprolide Acetate (LUPRON DEPOT IM) Inject into the muscle every 4 (four) months.    . warfarin (COUMADIN) 5 MG tablet Take 5 mg by mouth daily. 7.5 mg or 10 mg     No current facility-administered medications for this visit.    Allergies:  Allergies  Allergen Reactions  . Penicillins     Past Medical History, Surgical history, Social history, and Family History were reviewed and updated.  Physical Exam: Blood pressure 170/82, pulse 74, temperature 97.7 F (36.5 C), temperature source Oral, resp. rate 18, height 6\' 2"  (1.88 m), weight 219 lb  9.6 oz (99.61 kg), SpO2 100 %. ECOG: 1 General appearance: alert awake  Without distress. Head: Normocephalic, without obvious abnormality no oral ulcers or lesions. Neck: no adenopathy no thyroid masses. Lymph nodes: Cervical, supraclavicular, and axillary nodes normal. Heart:regular rate and rhythm, S1, S2 normal, no murmur, click, rub or gallop Lung:chest clear, no wheezing, rales, normal symmetric air entry Abdomin: soft, non-tender, without masses or organomegaly no shifting dullness or ascites. EXT:no erythema, induration, or  nodules Skin: No erythema or rashes.  Lab Results: Lab Results  Component Value Date   WBC 5.9 02/22/2015   HGB 13.1 02/22/2015   HCT 38.9 02/22/2015   MCV 89.2 02/22/2015   PLT 192 02/22/2015     Chemistry      Component Value Date/Time   NA 141 02/22/2015 0814   NA 140 09/18/2011 0915   K 3.8 02/22/2015 0814   K 4.7 09/18/2011 0915   CL 104 06/11/2012 0817   CL 104 09/18/2011 0915   CO2 26 02/22/2015 0814   CO2 31 09/18/2011 0915   BUN 13.2 02/22/2015 0814   BUN 14 09/18/2011 0915   CREATININE 0.9 02/22/2015 0814   CREATININE 0.93 09/18/2011 0915      Component Value Date/Time   CALCIUM 9.0 02/22/2015 0814   CALCIUM 9.1 09/18/2011 0915   ALKPHOS 55 02/22/2015 0814   ALKPHOS 41 09/18/2011 0915   AST 21 02/22/2015 0814   AST 23 09/18/2011 0915   ALT 19 02/22/2015 0814   ALT 25 09/18/2011 0915   BILITOT 0.72 02/22/2015 0814   BILITOT 0.5 09/18/2011 0915         Results for Fittro, HARU OBANNON (MRN [id number]) as of 02/24/2015 09:29  Ref. Range 09/21/2014 08:09 11/30/2014 09:22 02/22/2015 08:14  PSA Latest Ref Range: <=4.00 ng/mL 1.59 1.42 1.29       Impression and Plan:  1. This is a pleasant 69 year old gentleman with prostate cancer. He has castration resistant disease and biochemical relapse. Casodex withdrawal between January 2016 and February 2016 did not yield any benefit.   He started Zytiga on 05/13/2014 without any complications. His PSA continues to decline down to 1.29. The plan is to continue with the same dose and schedule given his excellent tolerance and PSA response. Different salvage therapy will be used upon symptomatic progression.   2. Androgen deprivation. He continues to be on Lupron under the care of Dr. Risa Grill.   3. Bony disease. His bone scan done in 06/01/2013 was normal.  4. 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  No recent thrombosis or bleeding episodes.  5. Hypertension: His blood pressure although high today, he have been checked periodically by his primary care provider and his blood pressure is within normal range. As is likely related to anxiety about his PSA and we'll continue to monitor moving forward.  6. Follow up: in 04/2015.    [facility], MD 12/29/20169:38 AM

## 2015-02-24 NOTE — Telephone Encounter (Signed)
Gave patient avs report and appointments for March  °

## 2015-02-28 MED FILL — ZYTIGA 250 MG TABLET: 250 | 30 days supply | Qty: 120 | Fill #0

## 2015-03-01 ENCOUNTER — Other Ambulatory Visit: Payer: Self-pay | Admitting: *Deleted

## 2015-03-01 MED ORDER — ABIRATERONE ACETATE 250 MG PO TABS: 1000.0000 mg | ORAL_TABLET | Freq: Every day | ORAL | Status: DC

## 2015-03-28 ENCOUNTER — Other Ambulatory Visit: Payer: Self-pay | Admitting: *Deleted

## 2015-03-28 MED ORDER — ABIRATERONE ACETATE 250 MG PO TABS: 1000.0000 mg | ORAL_TABLET | Freq: Every day | ORAL | Status: DC

## 2015-03-28 MED FILL — ZYTIGA 250 MG TABLET: 250 | 30 days supply | Qty: 120 | Fill #0

## 2015-04-28 ENCOUNTER — Other Ambulatory Visit: Payer: Self-pay | Admitting: *Deleted

## 2015-04-28 MED ORDER — ABIRATERONE ACETATE 250 MG PO TABS: 1000.0000 mg | ORAL_TABLET | Freq: Every day | ORAL | Status: DC

## 2015-04-28 MED FILL — ZYTIGA 250 MG TABLET: 250 | 30 days supply | Qty: 120 | Fill #0

## 2015-05-24 ENCOUNTER — Other Ambulatory Visit (HOSPITAL_BASED_OUTPATIENT_CLINIC_OR_DEPARTMENT_OTHER): Payer: Medicare Other

## 2015-05-24 DIAGNOSIS — C61 Malignant neoplasm of prostate: Secondary | ICD-10-CM

## 2015-05-24 LAB — CBC WITH DIFFERENTIAL/PLATELET
BASO%: 0.8 % (ref 0.0–2.0)
Basophils Absolute: 0 10*3/uL (ref 0.0–0.1)
EOS%: 5.2 % (ref 0.0–7.0)
Eosinophils Absolute: 0.3 10*3/uL (ref 0.0–0.5)
HCT: 38.8 % (ref 38.4–49.9)
HGB: 12.9 g/dL — ABNORMAL LOW (ref 13.0–17.1)
LYMPH%: 28.2 % (ref 14.0–49.0)
MCH: 30.4 pg (ref 27.2–33.4)
MCHC: 33.3 g/dL (ref 32.0–36.0)
MCV: 91.2 fL (ref 79.3–98.0)
MONO#: 0.3 10*3/uL (ref 0.1–0.9)
MONO%: 6 % (ref 0.0–14.0)
NEUT#: 3.4 10*3/uL (ref 1.5–6.5)
NEUT%: 59.8 % (ref 39.0–75.0)
PLATELETS: 209 10*3/uL (ref 140–400)
RBC: 4.25 10*6/uL (ref 4.20–5.82)
RDW: 13.3 % (ref 11.0–14.6)
WBC: 5.7 10*3/uL (ref 4.0–10.3)
lymph#: 1.6 10*3/uL (ref 0.9–3.3)

## 2015-05-24 LAB — COMPREHENSIVE METABOLIC PANEL
ALT: 17 U/L (ref 0–55)
ANION GAP: 6 meq/L (ref 3–11)
AST: 20 U/L (ref 5–34)
Albumin: 3.4 g/dL — ABNORMAL LOW (ref 3.5–5.0)
Alkaline Phosphatase: 64 U/L (ref 40–150)
BUN: 13.6 mg/dL (ref 7.0–26.0)
CHLORIDE: 107 meq/L (ref 98–109)
CO2: 29 meq/L (ref 22–29)
Calcium: 9 mg/dL (ref 8.4–10.4)
Creatinine: 0.8 mg/dL (ref 0.7–1.3)
EGFR: 90 mL/min/{1.73_m2} — AB (ref >90)
GLUCOSE: 105 mg/dL (ref 70–140)
Potassium: 3.7 mEq/L (ref 3.5–5.1)
SODIUM: 142 meq/L (ref 136–145)
Total Bilirubin: 0.82 mg/dL (ref 0.20–1.20)
Total Protein: 6.8 g/dL (ref 6.4–8.3)

## 2015-05-25 LAB — PSA (PARALLEL TESTING): PSA: 1.53 ng/mL (ref <=4.00)

## 2015-05-25 LAB — PSA: Prostate Specific Ag, Serum: 1.6 ng/mL (ref 0.0–4.0)

## 2015-05-26 ENCOUNTER — Ambulatory Visit (HOSPITAL_BASED_OUTPATIENT_CLINIC_OR_DEPARTMENT_OTHER): Payer: Medicare Other | Admitting: Oncology

## 2015-05-26 ENCOUNTER — Telehealth: Payer: Self-pay | Admitting: Oncology

## 2015-05-26 VITALS — BP 161/82 | HR 80 | Temp 97.6°F | Resp 18 | Ht 74.0 in | Wt 221.7 lb

## 2015-05-26 DIAGNOSIS — R5383 Other fatigue: Secondary | ICD-10-CM

## 2015-05-26 DIAGNOSIS — E291 Testicular hypofunction: Secondary | ICD-10-CM | POA: Diagnosis not present

## 2015-05-26 DIAGNOSIS — I10 Essential (primary) hypertension: Secondary | ICD-10-CM | POA: Diagnosis not present

## 2015-05-26 DIAGNOSIS — Z86711 Personal history of pulmonary embolism: Secondary | ICD-10-CM

## 2015-05-26 DIAGNOSIS — Z86718 Personal history of other venous thrombosis and embolism: Secondary | ICD-10-CM

## 2015-05-26 DIAGNOSIS — C61 Malignant neoplasm of prostate: Secondary | ICD-10-CM | POA: Diagnosis not present

## 2015-05-26 NOTE — Progress Notes (Signed)
Hematology and Oncology Follow Up Visit  [name]:[id number] [birth year] 70 y.o. 05/26/2015 8:35 AM   Principle Diagnosis: 69 year old gentleman with prostate cancer diagnosed in 2003. He had a Gleason score 4 + 3 equals 7. PSA 6.45.   Prior Therapy:  1. He is status post a prostatectomy and lymph node dissection done on November 05, 2001. He had a Gleason score 4 + 3 equals 7. He had  focal extracapsular extension, both seminal vesicles were involved indicating his stage was T3b N0.  2. The patient received a total of 6480 cGy of radiation therapy in 36 fractions between February and April 2005.  3. The patient had a rise in August of 2011 up to 35 and he was started on Lupron. His PSA nadir down to 4.11. Most recently his PSA was up to 5.91 in December of 2012 with a testosterone level of 35. His staging workup did not reveal any bony metastasis. His his PSA went up to 10 in 04/2011.  4. Casodex was added to Lupron on 05/2011. This was discontinued in January 2016 due to progression of disease.  Current therapy: Zytiga 1000 mg daily started on 05/13/2014.  Interim History: [name] presents today for a followup visit. Since his last visit, he continues to do very well without any recent complaints. He has not reported any new issue with Zytiga. He does report some mild fatigue which is manageable. He did not report any nausea, lower extremity swelling, decrease in his appetite or decline in his performance status. 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 He does not report any arthralgias or myalgias. He continues to exercise daily including cardiovascular exercise and light weightlifting.     He continues to be on warfarin and have not had any bleeding or clotting episodes. His levels have been adjusted because of Zytiga and INR have been therapeutic. His blood pressure have been monitored outside of the clinic and have been within normal range.    He has not reported any headaches or blurry vision or syncope. Has not reported any chest pain breath. Has not reported any palpitation or leg edema. He does not report any wheezing, cough or hemoptysis. He did not report any nausea or vomiting or abdominal pain. He has not reported any lymphadenopathy or petechiae. Rest of the review of systems unremarkable.   Medications: I have reviewed the patient's current medications.  Current Outpatient Prescriptions  Medication Sig Dispense Refill  . abiraterone Acetate (ZYTIGA) 250 MG tablet Take 4 tablets (1,000 mg total) by mouth daily. Take on an empty stomach 1 hour before or 2 hours after a meal 120 tablet 0  . ALPRAZolam (XANAX) 0.5 MG tablet Take 0.5 mg by mouth 3 (three) times daily as needed for sleep.    Marland Kitchen Leuprolide Acetate (LUPRON DEPOT IM) Inject into the muscle every 4 (four) months.    . warfarin (COUMADIN) 5 MG tablet Take 5 mg by mouth daily. 7.5 mg or 10 mg     No current facility-administered medications for this visit.    Allergies:  Allergies  Allergen Reactions  . Penicillins     Past Medical History, Surgical history, Social history, and Family History were reviewed and updated.  Physical Exam: Blood pressure 161/82, pulse 80, temperature 97.6 F (36.4 C), temperature source Oral, resp. rate 18, height 6\' 2"  (1.88 m), weight 221 lb 11.2 oz (100.562 kg), SpO2 100 %. ECOG: 1 General appearance: alert awake. Appeared without  distress. Head: Normocephalic, without obvious abnormality no oral thrush noted. Neck: no adenopathy no thyroid masses. Lymph nodes: Cervical, supraclavicular, and axillary nodes normal. Heart:regular rate and rhythm, S1, S2 normal, no murmur, click, rub or gallop Lung:chest clear, no wheezing, rales, normal symmetric air entry Abdomin: soft, non-tender, without masses or organomegaly no rebound or guarding. EXT:no erythema, induration, or nodules Skin: No erythema or rashes.  Lab Results: Lab  Results  Component Value Date   WBC 5.7 05/24/2015   HGB 12.9* 05/24/2015   HCT 38.8 05/24/2015   MCV 91.2 05/24/2015   PLT 209 05/24/2015     Chemistry      Component Value Date/Time   NA 142 05/24/2015 1119   NA 140 09/18/2011 0915   K 3.7 05/24/2015 1119   K 4.7 09/18/2011 0915   CL 104 06/11/2012 0817   CL 104 09/18/2011 0915   CO2 29 05/24/2015 1119   CO2 31 09/18/2011 0915   BUN 13.6 05/24/2015 1119   BUN 14 09/18/2011 0915   CREATININE 0.8 05/24/2015 1119   CREATININE 0.93 09/18/2011 0915      Component Value Date/Time   CALCIUM 9.0 05/24/2015 1119   CALCIUM 9.1 09/18/2011 0915   ALKPHOS 64 05/24/2015 1119   ALKPHOS 41 09/18/2011 0915   AST 20 05/24/2015 1119   AST 23 09/18/2011 0915   ALT 17 05/24/2015 1119   ALT 25 09/18/2011 0915   BILITOT 0.82 05/24/2015 1119   BILITOT 0.5 09/18/2011 0915        Results for Sonn, JOSEMIGUEL SETTLE (MRN Nichols:[id number]) as of 05/26/2015 08:03  Ref. Range 11/30/2014 09:22 02/22/2015 08:14 05/24/2015 11:19  PSA Latest Ref Range: <=4.00 ng/mL 1.42 1.29 1.53       Impression and Plan:  1. This is a pleasant 70 year old gentleman with prostate cancer. He has castration resistant disease and biochemical relapse. Casodex withdrawal between January 2016 and February 2016 did not yield any benefit.   He started Zytiga on 05/13/2014 without any complications. His PSA Remains under reasonable control currently at 1.6. He has no objections about continuing this medication at this time without any dose reduction or delay. If his PSA rises sharply, we will restage him with imaging studies and uses different salvage therapy.   2. Androgen deprivation. He continues to be on Lupron under the care of Dr. Risa Grill.   3. Bony disease. His bone scan done in 06/01/2013 was normal.  4. 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 No new episodes of thrombosis or  bleeding.  5. Hypertension: His blood pressure is elevated today but he does report normal blood pressure outside of the clinic. I continue to encourage him to obtain ambulatory blood pressure monitoring. We have discussed the possibility of needing antihypertensive medication if his blood pressure have been documented to be repeatedly elevated. These options would include hydrochlorothiazide, Ace inhibitors or calcium channel blockers. If this is to be done, we will coordinate that with Dr. Karlton Lemon his primary care provider.  6. Follow up: in 07/2015.    [facility], MD 3/30/20178:35 AM

## 2015-05-26 NOTE — Telephone Encounter (Signed)
Gave and printed appt sched and avs for pt for JUNE °

## 2015-05-30 ENCOUNTER — Other Ambulatory Visit: Payer: Self-pay | Admitting: *Deleted

## 2015-05-30 DIAGNOSIS — C61 Malignant neoplasm of prostate: Secondary | ICD-10-CM

## 2015-05-30 MED ORDER — ABIRATERONE ACETATE 250 MG PO TABS: 1000.0000 mg | ORAL_TABLET | Freq: Every day | ORAL | Status: DC

## 2015-05-30 MED FILL — ZYTIGA 250 MG TABLET: 250 | 30 days supply | Qty: 120 | Fill #0

## 2015-06-27 ENCOUNTER — Other Ambulatory Visit: Payer: Self-pay | Admitting: *Deleted

## 2015-06-27 DIAGNOSIS — C61 Malignant neoplasm of prostate: Secondary | ICD-10-CM

## 2015-06-27 MED ORDER — ABIRATERONE ACETATE 250 MG PO TABS: 1000.0000 mg | ORAL_TABLET | Freq: Every day | ORAL | Status: DC

## 2015-06-27 MED FILL — ZYTIGA 250 MG TABLET: 250 | 30 days supply | Qty: 120 | Fill #0

## 2015-07-29 ENCOUNTER — Other Ambulatory Visit: Payer: Self-pay | Admitting: *Deleted

## 2015-07-29 DIAGNOSIS — C61 Malignant neoplasm of prostate: Secondary | ICD-10-CM

## 2015-07-29 MED ORDER — ABIRATERONE ACETATE 250 MG PO TABS: 1000.0000 mg | ORAL_TABLET | Freq: Every day | ORAL | Status: DC

## 2015-07-29 MED FILL — ZYTIGA 250 MG TABLET: 250 | 30 days supply | Qty: 120 | Fill #0

## 2015-08-15 ENCOUNTER — Telehealth: Payer: Self-pay | Admitting: Oncology

## 2015-08-15 NOTE — Telephone Encounter (Signed)
PAL------ per FS moved 6/28 lab to 6/23 and 6/30 f/u to 6/26Spoke with patient he is aware.

## 2015-08-19 ENCOUNTER — Other Ambulatory Visit (HOSPITAL_BASED_OUTPATIENT_CLINIC_OR_DEPARTMENT_OTHER): Payer: Medicare Other

## 2015-08-19 DIAGNOSIS — C61 Malignant neoplasm of prostate: Secondary | ICD-10-CM | POA: Diagnosis not present

## 2015-08-19 LAB — CBC WITH DIFFERENTIAL/PLATELET
BASO%: 0.5 % (ref 0.0–2.0)
Basophils Absolute: 0 10*3/uL (ref 0.0–0.1)
EOS ABS: 0.2 10*3/uL (ref 0.0–0.5)
EOS%: 2.7 % (ref 0.0–7.0)
HEMATOCRIT: 38.2 % — AB (ref 38.4–49.9)
HEMOGLOBIN: 12.9 g/dL — AB (ref 13.0–17.1)
LYMPH#: 2 10*3/uL (ref 0.9–3.3)
LYMPH%: 25.1 % (ref 14.0–49.0)
MCH: 29.9 pg (ref 27.2–33.4)
MCHC: 33.9 g/dL (ref 32.0–36.0)
MCV: 88.1 fL (ref 79.3–98.0)
MONO#: 0.5 10*3/uL (ref 0.1–0.9)
MONO%: 6.6 % (ref 0.0–14.0)
NEUT%: 65.1 % (ref 39.0–75.0)
NEUTROS ABS: 5.1 10*3/uL (ref 1.5–6.5)
PLATELETS: 219 10*3/uL (ref 140–400)
RBC: 4.33 10*6/uL (ref 4.20–5.82)
RDW: 13.8 % (ref 11.0–14.6)
WBC: 7.8 10*3/uL (ref 4.0–10.3)

## 2015-08-19 LAB — COMPREHENSIVE METABOLIC PANEL
ALBUMIN: 3.2 g/dL — AB (ref 3.5–5.0)
ALK PHOS: 72 U/L (ref 40–150)
ALT: 17 U/L (ref 0–55)
ANION GAP: 8 meq/L (ref 3–11)
AST: 18 U/L (ref 5–34)
BILIRUBIN TOTAL: 0.74 mg/dL (ref 0.20–1.20)
BUN: 12.1 mg/dL (ref 7.0–26.0)
CALCIUM: 9 mg/dL (ref 8.4–10.4)
CO2: 27 mEq/L (ref 22–29)
CREATININE: 0.9 mg/dL (ref 0.7–1.3)
Chloride: 106 mEq/L (ref 98–109)
EGFR: 89 mL/min/{1.73_m2} — AB (ref >90)
Glucose: 102 mg/dl (ref 70–140)
Potassium: 3.7 mEq/L (ref 3.5–5.1)
Sodium: 141 mEq/L (ref 136–145)
TOTAL PROTEIN: 7.2 g/dL (ref 6.4–8.3)

## 2015-08-20 LAB — PSA: PROSTATE SPECIFIC AG, SERUM: 1.8 ng/mL (ref 0.0–4.0)

## 2015-08-22 ENCOUNTER — Telehealth: Payer: Self-pay | Admitting: Oncology

## 2015-08-22 ENCOUNTER — Ambulatory Visit (HOSPITAL_BASED_OUTPATIENT_CLINIC_OR_DEPARTMENT_OTHER): Payer: Medicare Other | Admitting: Oncology

## 2015-08-22 VITALS — BP 164/82 | HR 75 | Temp 98.0°F | Resp 18 | Ht 74.0 in | Wt 215.2 lb

## 2015-08-22 DIAGNOSIS — E291 Testicular hypofunction: Secondary | ICD-10-CM | POA: Diagnosis not present

## 2015-08-22 DIAGNOSIS — I10 Essential (primary) hypertension: Secondary | ICD-10-CM

## 2015-08-22 DIAGNOSIS — Z86718 Personal history of other venous thrombosis and embolism: Secondary | ICD-10-CM

## 2015-08-22 DIAGNOSIS — C61 Malignant neoplasm of prostate: Secondary | ICD-10-CM | POA: Diagnosis not present

## 2015-08-22 DIAGNOSIS — Z86711 Personal history of pulmonary embolism: Secondary | ICD-10-CM

## 2015-08-22 NOTE — Progress Notes (Signed)
Hematology and Oncology Follow Up Visit  [name] [id number] Jul 10, 1945 70 y.o. 08/22/2015 8:30 AM   Principle Diagnosis: 70 year old gentleman with prostate cancer diagnosed in 2003. He had a Gleason score 4 + 3 equals 7. PSA 6.45.   Prior Therapy:  1. He is status post a prostatectomy and lymph node dissection done on November 05, 2001. He had a Gleason score 4 + 3 equals 7. He had  focal extracapsular extension, both seminal vesicles were involved indicating his stage was T3b N0.  2. The patient received a total of 6480 cGy of radiation therapy in 36 fractions between February and April 2005.  3. The patient had a rise in August of 2011 up to 35 and he was started on Lupron. His PSA nadir down to 4.11. Most recently his PSA was up to 5.91 in December of 2012 with a testosterone level of 35. His staging workup did not reveal any bony metastasis. His his PSA went up to 10 in 04/2011.  4. Casodex was added to Lupron on 05/2011. This was discontinued in January 2016 due to progression of disease.  Current therapy: Zytiga 1000 mg daily started on 05/13/2014.  Interim History: [name] presents today for a followup visit. Since his last visit, he reports no major changes in his health. He remains on Zytiga without any new side effects. He does report some mild fatigue which is manageable. He did not report any nausea, lower extremity swelling, decrease in his appetite or decline in his performance status. He did lose a few pounds but his appetite is unchanged. 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 He continues to travel and this medication is not affecting his quality of life. He does not report any arthralgias or myalgias. He continues to exercise regularly.   He continues to be on warfarin and have not had any bleeding or clotting episodes. His levels have been adjusted because of Zytiga and INR have been therapeutic.    He has not reported any  headaches or blurry vision or syncope. He does not report any fevers, chills or sweats. Has not reported any chest pain breath. Has not reported any palpitation or leg edema. He does not report any wheezing, cough or hemoptysis. He did not report any nausea or vomiting or abdominal pain. He has not reported any lymphadenopathy or petechiae. Rest of the review of systems unremarkable.   Medications: I have reviewed the patient's current medications.  Current Outpatient Prescriptions  Medication Sig Dispense Refill  . abiraterone Acetate (ZYTIGA) 250 MG tablet Take 4 tablets (1,000 mg total) by mouth daily. Take on an empty stomach 1 hour before or 2 hours after a meal 120 tablet 0  . ALPRAZolam (XANAX) 0.5 MG tablet Take 0.5 mg by mouth 3 (three) times daily as needed for sleep.    Marland Kitchen Leuprolide Acetate (LUPRON DEPOT IM) Inject into the muscle every 4 (four) months.    . warfarin (COUMADIN) 5 MG tablet Take 5 mg by mouth daily. 7.5 mg or 10 mg     No current facility-administered medications for this visit.    Allergies:  Allergies  Allergen Reactions  . Penicillins     Past Medical History, Surgical history, Social history, and Family History were reviewed and updated.  Physical Exam: Blood pressure 164/82, pulse 75, temperature 98 F (36.7 C), temperature source Oral, resp. rate 18, height 6\' 2"  (1.88 m), weight 215 lb 3.2 oz (97.614 kg), SpO2 99 %.  ECOG: 0 General appearance: Pleasant-appearing gentleman without distress. Head: Normocephalic, without obvious abnormality no oral ulcers or lesions. Neck: no adenopathy no thyroid masses. Lymph nodes: Cervical, supraclavicular, and axillary nodes normal. Heart:regular rate and rhythm, S1, S2 normal, no murmur, click, rub or gallop Lung:chest clear, no wheezing, rales, normal symmetric air entry Abdomin: soft, non-tender, without masses or organomegaly no shifting dullness or ascites. EXT:no erythema, induration, or nodules Skin: No  erythema or rashes.  Lab Results: Lab Results  Component Value Date   WBC 7.8 08/19/2015   HGB 12.9* 08/19/2015   HCT 38.2* 08/19/2015   MCV 88.1 08/19/2015   PLT 219 08/19/2015     Chemistry      Component Value Date/Time   NA 141 08/19/2015 1014   NA 140 09/18/2011 0915   K 3.7 08/19/2015 1014   K 4.7 09/18/2011 0915   CL 104 06/11/2012 0817   CL 104 09/18/2011 0915   CO2 27 08/19/2015 1014   CO2 31 09/18/2011 0915   BUN 12.1 08/19/2015 1014   BUN 14 09/18/2011 0915   CREATININE 0.9 08/19/2015 1014   CREATININE 0.93 09/18/2011 0915      Component Value Date/Time   CALCIUM 9.0 08/19/2015 1014   CALCIUM 9.1 09/18/2011 0915   ALKPHOS 72 08/19/2015 1014   ALKPHOS 41 09/18/2011 0915   AST 18 08/19/2015 1014   AST 23 09/18/2011 0915   ALT 17 08/19/2015 1014   ALT 25 09/18/2011 0915   BILITOT 0.74 08/19/2015 1014   BILITOT 0.5 09/18/2011 0915        Results for Finkler, [name] (MRN [id number]) as of 08/22/2015 08:20  Ref. Range 05/24/2015 11:19 05/24/2015 11:19 08/19/2015 10:14  PSA Latest Ref Range: 0.0-4.0 ng/mL 1.53 1.6 1.8        Impression and Plan:  1. This is a pleasant 70 year old gentleman with prostate cancer. He has castration resistant disease and biochemical relapse. Casodex withdrawal between January 2016 and February 2016 did not yield any benefit.   He started Zytiga on 05/13/2014 without any complications. His PSA Slightly elevated to 1.8 from a nadir of 1.2 in December 2016. Overall, his PSA under excellent control and his quality of life is unchanged. I recommended continuing the same dose and schedule.   2. Androgen deprivation. He continues to be on Lupron under the care of Dr. Risa Grill.   3. Bony disease. His bone scan done in 06/01/2013 was normal.  4. 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 No changes on his warfarin because of Zytiga.  5.  Hypertension: His blood pressure is elevated today but he does report normal blood pressure outside of the clinic. I have asked him to continue registering his blood pressure outside of the clinic setting. If he has persistent elevation of blood pressure he might require antihypertensive medications.  6. Follow up: in 3 months or sooner if needed to.   [facility], MD 6/26/20178:30 AM

## 2015-08-22 NOTE — Telephone Encounter (Signed)
Gave and printed appt sched and avs for pt for SEpt °

## 2015-08-24 ENCOUNTER — Other Ambulatory Visit: Payer: Medicare Other

## 2015-08-25 ENCOUNTER — Other Ambulatory Visit: Payer: Self-pay | Admitting: *Deleted

## 2015-08-25 ENCOUNTER — Other Ambulatory Visit: Payer: Self-pay | Admitting: Oncology

## 2015-08-25 DIAGNOSIS — C61 Malignant neoplasm of prostate: Secondary | ICD-10-CM

## 2015-08-25 MED ORDER — ABIRATERONE ACETATE 250 MG PO TABS: 1000.0000 mg | ORAL_TABLET | Freq: Every day | ORAL | Status: DC

## 2015-08-25 MED FILL — ZYTIGA 250 MG TABLET: 250 | 30 days supply | Qty: 120 | Fill #0

## 2015-08-26 ENCOUNTER — Ambulatory Visit: Payer: Medicare Other | Admitting: Oncology

## 2015-09-12 ENCOUNTER — Emergency Department (HOSPITAL_COMMUNITY): Payer: Medicare Other

## 2015-09-12 ENCOUNTER — Encounter (HOSPITAL_COMMUNITY): Payer: Self-pay

## 2015-09-12 ENCOUNTER — Inpatient Hospital Stay (HOSPITAL_COMMUNITY)
Admission: EM | Admit: 2015-09-12 | Discharge: 2015-09-17 | DRG: 027 | Disposition: A | Discharge status: Home Health | Payer: Medicare Other | Attending: Neurosurgery | Admitting: Neurosurgery

## 2015-09-12 DIAGNOSIS — R791 Abnormal coagulation profile: Secondary | ICD-10-CM | POA: Diagnosis present

## 2015-09-12 DIAGNOSIS — Z923 Personal history of irradiation: Secondary | ICD-10-CM | POA: Diagnosis not present

## 2015-09-12 DIAGNOSIS — Z79899 Other long term (current) drug therapy: Secondary | ICD-10-CM | POA: Diagnosis not present

## 2015-09-12 DIAGNOSIS — I6200 Nontraumatic subdural hemorrhage, unspecified: Secondary | ICD-10-CM | POA: Diagnosis present

## 2015-09-12 DIAGNOSIS — W228XXA Striking against or struck by other objects, initial encounter: Secondary | ICD-10-CM | POA: Diagnosis not present

## 2015-09-12 DIAGNOSIS — Z86711 Personal history of pulmonary embolism: Secondary | ICD-10-CM

## 2015-09-12 DIAGNOSIS — I10 Essential (primary) hypertension: Secondary | ICD-10-CM | POA: Diagnosis present

## 2015-09-12 DIAGNOSIS — E876 Hypokalemia: Secondary | ICD-10-CM | POA: Diagnosis present

## 2015-09-12 DIAGNOSIS — S065XAA Traumatic subdural hemorrhage with loss of consciousness status unknown, initial encounter: Secondary | ICD-10-CM

## 2015-09-12 DIAGNOSIS — Z7901 Long term (current) use of anticoagulants: Secondary | ICD-10-CM

## 2015-09-12 DIAGNOSIS — C61 Malignant neoplasm of prostate: Secondary | ICD-10-CM | POA: Diagnosis present

## 2015-09-12 DIAGNOSIS — Z880 Allergy status to penicillin: Secondary | ICD-10-CM | POA: Diagnosis not present

## 2015-09-12 DIAGNOSIS — Z86718 Personal history of other venous thrombosis and embolism: Secondary | ICD-10-CM | POA: Diagnosis not present

## 2015-09-12 DIAGNOSIS — T45515A Adverse effect of anticoagulants, initial encounter: Secondary | ICD-10-CM | POA: Diagnosis present

## 2015-09-12 DIAGNOSIS — G8918 Other acute postprocedural pain: Secondary | ICD-10-CM | POA: Diagnosis not present

## 2015-09-12 DIAGNOSIS — S065X9A Traumatic subdural hemorrhage with loss of consciousness of unspecified duration, initial encounter: Principal | ICD-10-CM | POA: Diagnosis present

## 2015-09-12 DIAGNOSIS — Z9221 Personal history of antineoplastic chemotherapy: Secondary | ICD-10-CM | POA: Diagnosis not present

## 2015-09-12 DIAGNOSIS — I82409 Acute embolism and thrombosis of unspecified deep veins of unspecified lower extremity: Secondary | ICD-10-CM | POA: Diagnosis not present

## 2015-09-12 LAB — CBC
HCT: 37.1 % — ABNORMAL LOW (ref 39.0–52.0)
HEMOGLOBIN: 12.5 g/dL — AB (ref 13.0–17.0)
MCH: 29.7 pg (ref 26.0–34.0)
MCHC: 33.7 g/dL (ref 30.0–36.0)
MCV: 88.1 fL (ref 78.0–100.0)
Platelets: 185 10*3/uL (ref 150–400)
RBC: 4.21 MIL/uL — AB (ref 4.22–5.81)
RDW: 13.7 % (ref 11.5–15.5)
WBC: 6.5 10*3/uL (ref 4.0–10.5)

## 2015-09-12 LAB — BASIC METABOLIC PANEL
ANION GAP: 6 (ref 5–15)
BUN: 13 mg/dL (ref 6–20)
CALCIUM: 8.7 mg/dL — AB (ref 8.9–10.3)
CO2: 26 mmol/L (ref 22–32)
Chloride: 108 mmol/L (ref 101–111)
Creatinine, Ser: 0.73 mg/dL (ref 0.61–1.24)
Glucose, Bld: 112 mg/dL — ABNORMAL HIGH (ref 65–99)
Potassium: 3.6 mmol/L (ref 3.5–5.1)
Sodium: 140 mmol/L (ref 135–145)

## 2015-09-12 LAB — PROTIME-INR
INR: 2.26 — AB (ref 0.00–1.49)
PROTHROMBIN TIME: 24 s — AB (ref 11.6–15.2)

## 2015-09-12 LAB — I-STAT TROPONIN, ED: TROPONIN I, POC: 0 ng/mL (ref 0.00–0.08)

## 2015-09-12 MED ORDER — PROTHROMBIN COMPLEX CONC HUMAN 500 UNITS IV KIT
2500.0000 [IU] | PACK | Status: DC
  Filled 2015-09-12: qty 100

## 2015-09-12 MED ORDER — VITAMIN K1 10 MG/ML IJ SOLN
10.0000 mg | INTRAMUSCULAR | Status: AC
  Administered 2015-09-12: 10 mg via INTRAVENOUS
  Filled 2015-09-12: qty 1

## 2015-09-12 MED ORDER — PROTHROMBIN COMPLEX CONC HUMAN 500 UNITS IV KIT
25.0000 [IU]/kg | PACK | INTRAVENOUS | Status: DC
  Filled 2015-09-12: qty 98

## 2015-09-12 NOTE — ED Notes (Signed)
Dr. Saintclair Halsted in to room.

## 2015-09-12 NOTE — H&P (Signed)
[name] is an 70 y.o. male.   Chief Complaint: Headaches left arm and numbness HPI: 70 year old gentleman who report vaguely remembers striking his head twice about 2-3 weeks ago and has had right-sided headaches ever since today he had episode of left arm numbness that lasted about 10-15 minutes resolved and now he feels totally normal.  Past Medical History  Diagnosis Date  . Cancer [facility])     History reviewed. No pertinent past surgical history.  No family history on file. Social History:  reports that he has never smoked. He does not have any smokeless tobacco history on file. He reports that he drinks alcohol. He reports that he does not use illicit drugs.  Allergies:  Allergies  Allergen Reactions  . Penicillins     Has patient had a PCN reaction causing immediate rash, facial/tongue/throat swelling, SOB or lightheadedness with hypotension: Y Has patient had a PCN reaction causing severe rash involving mucus membranes or skin necrosis: Y Has patient had a PCN reaction that required hospitalization: N Has patient had a PCN reaction occurring within the last 10 years: N If all of the above answers are "NO", then may proceed with Cephalosporin use.      (Not in a hospital admission)  Results for orders placed or performed during the hospital encounter of 09/12/15 (from the past 48 hour(s))  Protime-INR     Status: Abnormal   Collection Time: 09/12/15  8:16 PM  Result Value Ref Range   Prothrombin Time 24.0 (H) 11.6 - 15.2 seconds   INR 2.26 (H) 0.00 - 2.50  Basic metabolic panel     Status: Abnormal   Collection Time: 09/12/15  8:18 PM  Result Value Ref Range   Sodium 140 135 - 145 mmol/L   Potassium 3.6 3.5 - 5.1 mmol/L   Chloride 108 101 - 111 mmol/L   CO2 26 22 - 32 mmol/L   Glucose, Bld 112 (H) 65 - 99 mg/dL   BUN 13 6 - 20 mg/dL   Creatinine, Ser 0.73 0.61 - 1.24 mg/dL   Calcium 8.7 (L) 8.9 - 10.3 mg/dL   GFR calc non Af Amer >60 >60 mL/min   GFR calc  Af Amer >60 >60 mL/min    Comment: (NOTE) The eGFR has been calculated using the CKD EPI equation. This calculation has not been validated in all clinical situations. eGFR's persistently <60 mL/min signify possible Chronic Kidney Disease.    Anion gap 6 5 - 15  CBC     Status: Abnormal   Collection Time: 09/12/15  8:18 PM  Result Value Ref Range   WBC 6.5 4.0 - 10.5 K/uL   RBC 4.21 (L) 4.22 - 5.81 MIL/uL   Hemoglobin 12.5 (L) 13.0 - 17.0 g/dL   HCT 37.1 (L) 39.0 - 52.0 %   MCV 88.1 78.0 - 100.0 fL   MCH 29.7 26.0 - 34.0 pg   MCHC 33.7 30.0 - 36.0 g/dL   RDW 13.7 11.5 - 15.5 %   Platelets 185 150 - 400 K/uL  I-stat troponin, ED     Status: None   Collection Time: 09/12/15  8:24 PM  Result Value Ref Range   Troponin i, poc 0.00 0.00 - 0.08 ng/mL   Comment 3            Comment: Due to the release kinetics of cTnI, a negative result within the first hours of the onset of symptoms does not rule out myocardial infarction with certainty. If myocardial  infarction is still suspected, repeat the test at appropriate intervals.    Dg Chest 2 View  09/12/2015  CLINICAL DATA:  Left acute anterior chest pain, history of atrial flutter. EXAM: CHEST  2 VIEW COMPARISON:  03/18/2004, 07/11/2006 FINDINGS: Normal heart size and vascularity. Stable mild hyperinflation without focal pneumonia, collapse or consolidation. Stable nodular right upper lobe pleural scarring with calcification. Trachea is midline. No acute osseous finding. Degenerative changes of the spine with a mild scoliosis. IMPRESSION: Stable chest exam. No superimposed acute process or significant change. Electronically Signed   By: Jerilynn Mages.  Shick M.D.   On: 09/12/2015 20:07   Ct Head Wo Contrast  09/12/2015  CLINICAL DATA:  Continuous acute headache for 1 day. EXAM: CT HEAD WITHOUT CONTRAST TECHNIQUE: Contiguous axial images were obtained from the base of the skull through the vertex without intravenous contrast. COMPARISON:  None available  FINDINGS: Brain: There is a large complex lobulated right subdural hematoma with heterogeneous intermediate density and areas of hyperdensity. Appearance is compatible with a large right subacute subdural hematoma with areas of recent hemorrhage as well. Subdural hematoma thickness measures 25 mm, on the coronal reconstructions, image 33. This creates significant mass effect on the right cerebral hemisphere with diffuse sulcal effacement. The lobulation of the subdural hematoma may be related to complexity of the hemorrhage with septations or loculations. Difficult to completely exclude underlying hemorrhagic dural mass, although felt to be less likely. The right subdural hematoma does create right to left midline shift measuring 3 mm, image 17. No associated hydrocephalus. Basilar cisterns remain patent. No significant herniation. Vascular: No hyperdense vessel or unexpected calcification. Skull: Negative for fracture or focal lesion. Sinuses/Orbits: Minor maxillary mucosal thickening. Other sinuses and mastoids remain clear. Other: None. IMPRESSION: Large complex and lobulated subacute right subdural hematoma with areas of recent hemorrhage as well, with diffuse mass effect on the right cerebral hemisphere and leftward midline shift of 3 mm. Consider MRI without and with contrast if there is concern for underlying dural mass or lesion. These results were called by telephone at the time of interpretation on 09/12/2015 at 9:50 pm to Dr. Blanchie Dessert , who verbally acknowledged these results. Electronically Signed   By: Jerilynn Mages.  Shick M.D.   On: 09/12/2015 21:52    Review of Systems  Constitutional: Negative.   Eyes: Negative.   Respiratory: Negative.   Cardiovascular: Negative.   Genitourinary: Negative.   Musculoskeletal: Negative.   Skin: Negative.   Neurological: Positive for tingling, sensory change and headaches.    Blood pressure 173/95, pulse 68, temperature 98.6 F (37 C), temperature source  Oral, resp. rate 14, height _0  (1.905 m), weight 97.523 kg (215 lb), SpO2 98 %. Physical Exam  Constitutional: He is oriented to person, place, and time. He appears well-developed and well-nourished.  HENT:  Head: Normocephalic.  Eyes: Pupils are equal, round, and reactive to light.  Neck: Normal range of motion.  GI: Soft. Bowel sounds are normal.  Neurological: He is alert and oriented to person, place, and time. He has normal strength. GCS eye subscore is 4. GCS verbal subscore is 5. GCS motor subscore is 6.  Alert oriented pupils equal extraocular movements are intact strength 5 out of 5 upper and lower extremities no pronator drift  Skin: Skin is warm and dry.     Assessment/Plan 70 year old with 2 cm subacute subdural hematoma very high in the convexity with some small amount of benign midline shift. INR is elevated over to we'll give vitamin  K will also transfuse FFP M slowly drift back he does have a significant history of DVT and PE. We will watch him in the ICU as we correct his coagulopathy plan either burr hole craniotomy for subdural on Wednesday.  Tyjae Shvartsman P, MD 09/12/2015, 10:45 PM

## 2015-09-12 NOTE — Consult Note (Addendum)
PULMONARY / CRITICAL CARE MEDICINE   Name: [name] MRN: [id number] DOB: 10-14-1945    ADMISSION DATE:  09/12/2015 CONSULTATION DATE:  09/12/2015  REFERRING MD:  Dr. Saintclair Halsted  CHIEF COMPLAINT:  Headache  HISTORY OF PRESENT ILLNESS:   70 year old male with PMH as below, which is significant for prostate Ca s/p prostatectomy,chemo,radiotion. He remains on Zytiga daily since 05/13/2014. who presented to [facility] ED 7/17 with complaints of headache. He reports hitting his head twice about 2-3 weeks PTA, which he "vaguely recalls" doing. He has had R sided headaches since that time and more recently noticed left arm numbness of short duration which resolved spontaneously. Upon arrival to the ED he reportedly felt totally normal. He underwent CT scan of the head indicating right sided SDH with acute and subacute areas as well as midline shift 105mm. Neurosurgery believes he would benefit from craniotomy or burr hole, however he was coagulopathic so they planned to correct this first. PCCM was consulted to assist with medical management.   PAST MEDICAL HISTORY :  He  has a past medical history of Cancer ([place]).  PAST SURGICAL HISTORY: He  has no past surgical history on file.  Allergies  Allergen Reactions  . Penicillins     Has patient had a PCN reaction causing immediate rash, facial/tongue/throat swelling, SOB or lightheadedness with hypotension: Y Has patient had a PCN reaction causing severe rash involving mucus membranes or skin necrosis: Y Has patient had a PCN reaction that required hospitalization: N Has patient had a PCN reaction occurring within the last 10 years: N If all of the above answers are "NO", then may proceed with Cephalosporin use.     No current facility-administered medications on file prior to encounter.   Current Outpatient Prescriptions on File Prior to Encounter  Medication Sig  . Leuprolide Acetate (LUPRON DEPOT IM) Inject into the muscle every 4 (four) months.   . warfarin (COUMADIN) 5 MG tablet Take 15 mg by mouth daily.   Marland Kitchen ZYTIGA 250 MG tablet TAKE 4 TABLETS BY MOUTH DAILY ON AN EMPTY STOMACH 1 HOUR BEFORE OR 2 HOURS AFTER A MEAL  . abiraterone Acetate (ZYTIGA) 250 MG tablet Take 4 tablets (1,000 mg total) by mouth daily. Take on an empty stomach 1 hour before or 2 hours after a meal (Patient not taking: Reported on 09/12/2015)  . ALPRAZolam (XANAX) 0.5 MG tablet Take 0.5 mg by mouth 3 (three) times daily as needed for sleep.    FAMILY HISTORY:  His has no family status information on file.   SOCIAL HISTORY: He  reports that he has never smoked. He does not have any smokeless tobacco history on file. He reports that he drinks alcohol. He reports that he does not use illicit drugs.  REVIEW OF SYSTEMS:   Review of Systems  Constitutional: Positive for malaise/fatigue. Negative for fever, chills, weight loss and diaphoresis.  HENT: Negative for congestion, ear discharge, ear pain, hearing loss, nosebleeds, sore throat and tinnitus.   Eyes: Negative for blurred vision, double vision, photophobia, pain and discharge.  Respiratory: Negative for cough, hemoptysis and stridor.   Cardiovascular: Negative for chest pain, palpitations, orthopnea, claudication, leg swelling and PND.  Gastrointestinal: Negative for heartburn, nausea, vomiting, abdominal pain and diarrhea.  Genitourinary: Negative for dysuria, urgency, frequency and hematuria.  Musculoskeletal: Negative for back pain and neck pain.  Skin: Negative for itching and rash.  Neurological: Positive for dizziness, tingling, sensory change, weakness and headaches. Negative for tremors, speech  change, focal weakness and seizures.  Psychiatric/Behavioral: Negative for depression and suicidal ideas.     SUBJECTIVE:  No distress, tingling lue  VITAL SIGNS: BP 173/95 mmHg  Pulse 68  Temp(Src) 98.6 F (37 C) (Oral)  Resp 14  Ht 6\' 3"  (1.905 m)  Wt 97.523 kg (215 lb)  BMI 26.87 kg/m2  SpO2  98%  HEMODYNAMICS:    VENTILATOR SETTINGS:    INTAKE / OUTPUT:    PHYSICAL EXAMINATION: General:  Awake, no distress Neuro:  Strength wnl, tingliing left upper ext,perrl HEENT:  jvd wnl Cardiovascular:  s1 s2 RRR Lungs:  CTA Abdomen:  Soft, bs wnl, no r Musculoskeletal:  No edme Skin:  No rash, int small bruises legs  LABS:  BMET  Recent Labs Lab 09/12/15 2018  NA 140  K 3.6  CL 108  CO2 26  BUN 13  CREATININE 0.73  GLUCOSE 112*    Electrolytes  Recent Labs Lab 09/12/15 2018  CALCIUM 8.7*    CBC  Recent Labs Lab 09/12/15 2018  WBC 6.5  HGB 12.5*  HCT 37.1*  PLT 185    Coag's  Recent Labs Lab 09/12/15 2016  INR 2.26*    Sepsis Markers No results for input(s): LATICACIDVEN, PROCALCITON, O2SATVEN in the last 168 hours.  ABG No results for input(s): PHART, PCO2ART, PO2ART in the last 168 hours.  Liver Enzymes No results for input(s): AST, ALT, ALKPHOS, BILITOT, ALBUMIN in the last 168 hours.  Cardiac Enzymes No results for input(s): TROPONINI, PROBNP in the last 168 hours.  Glucose No results for input(s): GLUCAP in the last 168 hours.  Imaging Dg Chest 2 View  09/12/2015  CLINICAL DATA:  Left acute anterior chest pain, history of atrial flutter. EXAM: CHEST  2 VIEW COMPARISON:  03/18/2004, 07/11/2006 FINDINGS: Normal heart size and vascularity. Stable mild hyperinflation without focal pneumonia, collapse or consolidation. Stable nodular right upper lobe pleural scarring with calcification. Trachea is midline. No acute osseous finding. Degenerative changes of the spine with a mild scoliosis. IMPRESSION: Stable chest exam. No superimposed acute process or significant change. Electronically Signed   By: Jerilynn Mages.  Shick M.D.   On: 09/12/2015 20:07   Ct Head Wo Contrast  09/12/2015  CLINICAL DATA:  Continuous acute headache for 1 day. EXAM: CT HEAD WITHOUT CONTRAST TECHNIQUE: Contiguous axial images were obtained from the base of the skull through  the vertex without intravenous contrast. COMPARISON:  None available FINDINGS: Brain: There is a large complex lobulated right subdural hematoma with heterogeneous intermediate density and areas of hyperdensity. Appearance is compatible with a large right subacute subdural hematoma with areas of recent hemorrhage as well. Subdural hematoma thickness measures 25 mm, on the coronal reconstructions, image 33. This creates significant mass effect on the right cerebral hemisphere with diffuse sulcal effacement. The lobulation of the subdural hematoma may be related to complexity of the hemorrhage with septations or loculations. Difficult to completely exclude underlying hemorrhagic dural mass, although felt to be less likely. The right subdural hematoma does create right to left midline shift measuring 3 mm, image 17. No associated hydrocephalus. Basilar cisterns remain patent. No significant herniation. Vascular: No hyperdense vessel or unexpected calcification. Skull: Negative for fracture or focal lesion. Sinuses/Orbits: Minor maxillary mucosal thickening. Other sinuses and mastoids remain clear. Other: None. IMPRESSION: Large complex and lobulated subacute right subdural hematoma with areas of recent hemorrhage as well, with diffuse mass effect on the right cerebral hemisphere and leftward midline shift of 3 mm. Consider MRI without  and with contrast if there is concern for underlying dural mass or lesion. These results were called by telephone at the time of interpretation on 09/12/2015 at 9:50 pm to Dr. Blanchie Dessert , who verbally acknowledged these results. Electronically Signed   By: Jerilynn Mages.  Shick M.D.   On: 09/12/2015 21:52     STUDIES:  CT head 7/17 > Large complex and lobulated subacute right subdural hematoma with areas of recent hemorrhage as well, with diffuse mass effect on the right cerebral hemisphere and leftward midline shift of 3 mm.  CULTURES:   ANTIBIOTICS:   SIGNIFICANT  EVENTS:   LINES/TUBES:   DISCUSSION: 70 year old male with prostate Ca on daily Zytiga admitted 7/17 for with SDH to Dr. Saintclair Halsted.   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 I have discussed with resident/NP and other care providers such as pharmacist, RN and RRT. In addition, I personally evaluated patient and elicited key findings of: awake, alert, cooperative, nonfocal, headache mild rt temp, sensory defect left upper ext mostly hand, CT reviewed, subacute, he describes trauma to head x 2 (although mild but on coumadin), INR noted, vit k given , would give FFP 2 units now and assess inr post FFP, would hold off K centra with such strong history PE / dvt and such good clinical status with worry of over correction induced hyerpcoagulablestate, cbc in am , repeat imaging per Dr Saintclair Halsted, ecg pre op, his functional status at baseline is very good, will see how  he does with surgery then d/w NS about delays back to coumadin and consider retrievable filter possibly, will follow, move to ICU neuro and have strict neuro monitoring for changes, maintain NPO, add ppi  Lavon Paganini. Titus Mould, MD, [place] Pgr: [place] [facility] 09/13/2015 12:22 AM

## 2015-09-12 NOTE — ED Notes (Signed)
Pt stated "I've been having h/a's with an aura.  I used to have migraines in HS.  I've had prostate cancer, take blood thinners."

## 2015-09-12 NOTE — ED Notes (Signed)
Pt remains on monitor. 

## 2015-09-12 NOTE — ED Provider Notes (Addendum)
CSN: [id number]     Arrival date & time 09/12/15  1902 History   First MD Initiated Contact with Patient 09/12/15 2036     Chief Complaint  Patient presents with  . Chest Pain  . Headache     (Consider location/radiation/quality/duration/timing/severity/associated sxs/prior Treatment) Patient is a 70 y.o. male presenting with chest pain and headaches. The history is provided by the patient.  Chest Pain Pain location:  L chest Pain quality: dull   Pain radiates to:  Does not radiate Pain radiates to the back: no   Pain severity:  Mild Onset quality:  Sudden Timing:  Sporadic Progression:  Resolved Chronicity:  Chronic Context comment:  Spontaneous Relieved by:  Nothing Worsened by:  Nothing tried Ineffective treatments:  None tried Associated symptoms: headache and lower extremity edema   Associated symptoms: no cough, no nausea, no shortness of breath and not vomiting   Associated symptoms comment:  Pain is not precipitated by exertion never last more than a few seconds and usually occurs at rest. Risk factors: prior DVT/PE   Risk factors: no coronary artery disease, no diabetes mellitus, no hypertension, no immobilization and no surgery   Risk factors comment:  Prostate CA s/p treatment Headache Pain location:  Frontal Quality:  Sharp Radiates to:  Does not radiate Severity currently:  0/10 Severity at highest:  9/10 Onset quality:  Gradual Duration:  2 weeks Timing:  Intermittent Progression:  Resolved Chronicity:  Recurrent Similar to prior headaches: yes   Context comment:  Started spontaneously over the last few weeks but long hx of migraines as a child Relieved by:  Nothing Worsened by:  Nothing Ineffective treatments:  None tried Associated symptoms: photophobia   Associated symptoms: no cough, no nausea and no vomiting   Associated symptoms comment:  Today had scotoma and then developed a severe headache.  Also had some left hand numbness and weakness but  denies face or leg involvement but gone now.  States currently headache has resolved. Risk factors comment:  No head trauma but does take coumadin   Past Medical History  Diagnosis Date  . Cancer [facility])    History reviewed. No pertinent past surgical history. No family history on file. Social History  Substance Use Topics  . Smoking status: Never Smoker   . Smokeless tobacco: None  . Alcohol Use: Yes     Comment: socially    Review of Systems  Eyes: Positive for photophobia.  Respiratory: Negative for cough and shortness of breath.   Cardiovascular: Positive for chest pain.  Gastrointestinal: Negative for nausea and vomiting.  Neurological: Positive for headaches.  All other systems reviewed and are negative.     Allergies  Penicillins  Home Medications   Prior to Admission medications   Medication Sig Start Date End Date Taking? Authorizing Provider  abiraterone Acetate (ZYTIGA) 250 MG tablet Take 4 tablets (1,000 mg total) by mouth daily. Take on an empty stomach 1 hour before or 2 hours after a meal 08/25/15   [name], MD  ALPRAZolam Duanne Moron) 0.5 MG tablet Take 0.5 mg by mouth 3 (three) times daily as needed for sleep.    Historical Provider, MD  Leuprolide Acetate (LUPRON DEPOT IM) Inject into the muscle every 4 (four) months.    Historical Provider, MD  warfarin (COUMADIN) 5 MG tablet Take 5 mg by mouth daily. 7.5 mg or 10 mg    Historical Provider, MD  ZYTIGA 250 MG tablet TAKE 4 TABLETS BY MOUTH DAILY ON AN  EMPTY STOMACH 1 HOUR BEFORE OR 2 HOURS AFTER A MEAL 08/25/15   [name], MD   BP 176/91 mmHg  Pulse 70  Temp(Src) 98.6 F (37 C) (Oral)  Resp 21  Ht 6\' 3"  (1.905 m)  Wt 215 lb (97.523 kg)  BMI 26.87 kg/m2  SpO2 97% Physical Exam  Constitutional: He is oriented to person, place, and time. He appears well-developed and well-nourished. No distress.  HENT:  Head: Normocephalic and atraumatic.  Mouth/Throat: Oropharynx is clear and moist.  Eyes:  Conjunctivae and EOM are normal. Pupils are equal, round, and reactive to light.  Neck: Normal range of motion. Neck supple.  Cardiovascular: Normal rate, regular rhythm and intact distal pulses.   No murmur heard. Pulmonary/Chest: Effort normal and breath sounds normal. No respiratory distress. He has no wheezes. He has no rales.  Abdominal: Soft. He exhibits no distension. There is no tenderness. There is no rebound and no guarding.  Musculoskeletal: Normal range of motion. He exhibits edema. He exhibits no tenderness.  1+ swelling to the left ankle  Neurological: He is alert and oriented to person, place, and time. He has normal strength. No cranial nerve deficit or sensory deficit. Coordination normal.  No visual field cuts.  5/5 strength to the bilateral upper and lower ext.  Normal speech  Skin: Skin is warm and dry. No rash noted. No erythema.  Psychiatric: He has a normal mood and affect. His behavior is normal.  Nursing note and vitals reviewed.   ED Course  Procedures (including critical care time) Labs Review Labs Reviewed  BASIC METABOLIC PANEL - Abnormal; Notable for the following:    Glucose, Bld 112 (*)    Calcium 8.7 (*)    All other components within normal limits  CBC - Abnormal; Notable for the following:    RBC 4.21 (*)    Hemoglobin 12.5 (*)    HCT 37.1 (*)    All other components within normal limits  PROTIME-INR - Abnormal; Notable for the following:    Prothrombin Time 24.0 (*)    INR 2.26 (*)    All other components within normal limits  I-STAT TROPOININ, ED    Imaging Review Dg Chest 2 View  09/12/2015  CLINICAL DATA:  Left acute anterior chest pain, history of atrial flutter. EXAM: CHEST  2 VIEW COMPARISON:  03/18/2004, 07/11/2006 FINDINGS: Normal heart size and vascularity. Stable mild hyperinflation without focal pneumonia, collapse or consolidation. Stable nodular right upper lobe pleural scarring with calcification. Trachea is midline. No acute  osseous finding. Degenerative changes of the spine with a mild scoliosis. IMPRESSION: Stable chest exam. No superimposed acute process or significant change. Electronically Signed   By: Jerilynn Mages.  Shick M.D.   On: 09/12/2015 20:07   Ct Head Wo Contrast  09/12/2015  CLINICAL DATA:  Continuous acute headache for 1 day. EXAM: CT HEAD WITHOUT CONTRAST TECHNIQUE: Contiguous axial images were obtained from the base of the skull through the vertex without intravenous contrast. COMPARISON:  None available FINDINGS: Brain: There is a large complex lobulated right subdural hematoma with heterogeneous intermediate density and areas of hyperdensity. Appearance is compatible with a large right subacute subdural hematoma with areas of recent hemorrhage as well. Subdural hematoma thickness measures 25 mm, on the coronal reconstructions, image 33. This creates significant mass effect on the right cerebral hemisphere with diffuse sulcal effacement. The lobulation of the subdural hematoma may be related to complexity of the hemorrhage with septations or loculations. Difficult to completely exclude  underlying hemorrhagic dural mass, although felt to be less likely. The right subdural hematoma does create right to left midline shift measuring 3 mm, image 17. No associated hydrocephalus. Basilar cisterns remain patent. No significant herniation. Vascular: No hyperdense vessel or unexpected calcification. Skull: Negative for fracture or focal lesion. Sinuses/Orbits: Minor maxillary mucosal thickening. Other sinuses and mastoids remain clear. Other: None. IMPRESSION: Large complex and lobulated subacute right subdural hematoma with areas of recent hemorrhage as well, with diffuse mass effect on the right cerebral hemisphere and leftward midline shift of 3 mm. Consider MRI without and with contrast if there is concern for underlying dural mass or lesion. These results were called by telephone at the time of interpretation on 09/12/2015 at 9:50  pm to Dr. Blanchie Dessert , who verbally acknowledged these results. Electronically Signed   By: Jerilynn Mages.  Shick M.D.   On: 09/12/2015 21:52   I have personally reviewed and evaluated these images and lab results as part of my medical decision-making.   EKG Interpretation   Date/Time:  Monday September 12 2015 19:16:33 EDT Ventricular Rate:  64 PR Interval:    QRS Duration: 107 QT Interval:  438 QTC Calculation: 452 R Axis:   -36 Text Interpretation:  Sinus rhythm Left axis deviation RSR' in V1 or V2,  probably normal variant No significant change since last tracing Confirmed  by Maryan Rued  MD, Loree Fee ([id number]) on 09/12/2015 8:42:03 PM      MDM   Final diagnoses:  None   Patient is a 70 year old male who comes today with 2 complaints.  Patient states that for the last 2 weeks he's had worsening headaches which she describes as a migraine headache. It is severe in the frontal area of the face and behind the right eye. They have been almost every day for the last 2 weeks. He also got some bright flashing lights in bilateral eyes before the headache today. Also it made him come today was he had some numbness and weakness in his left hand. It did not seem to be the whole arm and it did not seem to be related to the face or the leg. He had no change in mental status during this time. He has not been taking any medication for the headaches. Currently the headache is resolved and his exam is normal with normal strength of the hand and wrist. 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 So given patient's history of prostate cancer, on Coumadin for prior PEs and DVT will do a head CT to rule out metastases or bleed. Could be the patient is just having complex migraine headaches as there are no symptoms of stroke at this time. Patient is mildly hypertensive here 176/91. He is requesting treatment for his hypertension.  Patient's chest pain does not seem like ACS. It's only seconds at a time and  usually occurs at rest. He never gets any pain with exertion and states he feels the best with his walk every morning. EKG is unchanged, chest x-ray, troponin are all within normal limits. INR is pending but may just have atypical chest pain from prior PEs.  9:58 PM CT shows a large complex lobulated subacute right subdural hematoma with recent hemorrhage. He has 3 mm of midline shift. Patient is still neurologically intact and currently not spilling any symptoms. INR is 2.4. Discussed with Dr. Finis Bud who recommended rapid reversal protocol which was initiated and requested that patient be admitted to the ICU at [facility]  CRITICAL CARE Performed by: Blanchie Dessert Total critical care time: 30 minutes Critical care time was exclusive of separately billable procedures and treating other patients. Critical care was necessary to treat or prevent imminent or life-threatening deterioration. Critical care was time spent personally by me on the following activities: development of treatment plan with patient and/or surrogate as well as nursing, discussions with consultants, evaluation of patient's response to treatment, examination of patient, obtaining history from patient or surrogate, ordering and performing treatments and interventions, ordering and review of laboratory studies, ordering and review of radiographic studies, pulse oximetry and re-evaluation of patient's condition.   Blanchie Dessert, MD 09/12/15 2159  Blanchie Dessert, MD 09/12/15 2159

## 2015-09-12 NOTE — ED Notes (Signed)
Patient c/o chest pain and HA that has been off and on x2 weeks.  Currently in triage patient states that does not have chest pain.   Patient states that has had some numbness in his left hand that comes and goes and is currently having aura's with bright lights in bilateral eyes.  Patient explains that when the lights and numbness occur he will get a HA shortly after.    Patient denies dizziness, denies SOB, denies N/V.  Patient denies pain at this time.

## 2015-09-13 DIAGNOSIS — I6200 Nontraumatic subdural hemorrhage, unspecified: Secondary | ICD-10-CM

## 2015-09-13 LAB — CBC WITH DIFFERENTIAL/PLATELET
BASOS PCT: 0 %
Basophils Absolute: 0 10*3/uL (ref 0.0–0.1)
EOS ABS: 0.2 10*3/uL (ref 0.0–0.7)
EOS PCT: 3 %
HCT: 34.4 % — ABNORMAL LOW (ref 39.0–52.0)
Hemoglobin: 11.6 g/dL — ABNORMAL LOW (ref 13.0–17.0)
LYMPHS ABS: 1.4 10*3/uL (ref 0.7–4.0)
Lymphocytes Relative: 23 %
MCH: 29.9 pg (ref 26.0–34.0)
MCHC: 33.7 g/dL (ref 30.0–36.0)
MCV: 88.7 fL (ref 78.0–100.0)
MONOS PCT: 6 %
Monocytes Absolute: 0.3 10*3/uL (ref 0.1–1.0)
NEUTROS PCT: 68 %
Neutro Abs: 4.2 10*3/uL (ref 1.7–7.7)
PLATELETS: 172 10*3/uL (ref 150–400)
RBC: 3.88 MIL/uL — ABNORMAL LOW (ref 4.22–5.81)
RDW: 13.7 % (ref 11.5–15.5)
WBC: 6.1 10*3/uL (ref 4.0–10.5)

## 2015-09-13 LAB — COMPREHENSIVE METABOLIC PANEL
ALBUMIN: 3.2 g/dL — AB (ref 3.5–5.0)
ALT: 17 U/L (ref 17–63)
ANION GAP: 4 — AB (ref 5–15)
AST: 19 U/L (ref 15–41)
Alkaline Phosphatase: 54 U/L (ref 38–126)
BUN: 8 mg/dL (ref 6–20)
CALCIUM: 8.4 mg/dL — AB (ref 8.9–10.3)
CHLORIDE: 108 mmol/L (ref 101–111)
CO2: 28 mmol/L (ref 22–32)
Creatinine, Ser: 0.72 mg/dL (ref 0.61–1.24)
GFR calc non Af Amer: >60 mL/min (ref >60)
GLUCOSE: 102 mg/dL — AB (ref 65–99)
POTASSIUM: 3 mmol/L — AB (ref 3.5–5.1)
SODIUM: 140 mmol/L (ref 135–145)
Total Bilirubin: 1.2 mg/dL (ref 0.3–1.2)
Total Protein: 6 g/dL — ABNORMAL LOW (ref 6.5–8.1)

## 2015-09-13 LAB — ABO/RH: ABO/RH(D): A NEG

## 2015-09-13 LAB — PROTIME-INR
INR: 1.87 — ABNORMAL HIGH (ref 0.00–1.49)
INR: 1.28 (ref 0.00–1.49)
PROTHROMBIN TIME: 21.5 s — AB (ref 11.6–15.2)
PROTHROMBIN TIME: 16.2 s — AB (ref 11.6–15.2)

## 2015-09-13 LAB — TYPE AND SCREEN
ABO/RH(D): A NEG
ANTIBODY SCREEN: NEGATIVE

## 2015-09-13 LAB — APTT: aPTT: 30 seconds (ref 24–37)

## 2015-09-13 LAB — MRSA PCR SCREENING: MRSA BY PCR: NEGATIVE

## 2015-09-13 LAB — GLUCOSE, CAPILLARY: Glucose-Capillary: 113 mg/dL — ABNORMAL HIGH (ref 65–99)

## 2015-09-13 MED ORDER — SODIUM CHLORIDE 0.9 % IV SOLN
INTRAVENOUS | Status: DC
  Administered 2015-09-13 – 2015-09-14 (×3): via INTRAVENOUS

## 2015-09-13 MED ORDER — FAMOTIDINE IN NACL 20-0.9 MG/50ML-% IV SOLN: 20.0000 mg | Freq: Two times a day (BID) | INTRAVENOUS | Status: DC

## 2015-09-13 MED ORDER — SODIUM CHLORIDE 0.9 % IV SOLN
Freq: Once | INTRAVENOUS | Status: AC
  Administered 2015-09-14: 18:00:00 via INTRAVENOUS

## 2015-09-13 MED ORDER — SENNOSIDES-DOCUSATE SODIUM 8.6-50 MG PO TABS
1.0000 | ORAL_TABLET | Freq: Two times a day (BID) | ORAL | Status: DC
Start: 2015-09-13 — End: 2015-09-17
  Administered 2015-09-15 – 2015-09-17 (×5): 1 via ORAL
  Filled 2015-09-13 (×7): qty 1

## 2015-09-13 MED ORDER — SODIUM CHLORIDE 0.9 % IV SOLN: Freq: Once | INTRAVENOUS | Status: DC

## 2015-09-13 MED ORDER — ACETAMINOPHEN 650 MG RE SUPP: 650.0000 mg | RECTAL | Status: DC | PRN

## 2015-09-13 MED ORDER — ABIRATERONE ACETATE 250 MG PO TABS
1000.0000 mg | ORAL_TABLET | Freq: Every day | ORAL | Status: DC
  Administered 2015-09-13 – 2015-09-17 (×5): 1000 mg via ORAL
  Filled 2015-09-13 (×6): qty 4

## 2015-09-13 MED ORDER — LEVETIRACETAM 500 MG/5ML IV SOLN
500.0000 mg | Freq: Two times a day (BID) | INTRAVENOUS | Status: DC
  Administered 2015-09-13 – 2015-09-14 (×4): 500 mg via INTRAVENOUS
  Filled 2015-09-13 (×5): qty 5

## 2015-09-13 MED ORDER — INSULIN ASPART 100 UNIT/ML ~~LOC~~ SOLN: 0.0000 [IU] | SUBCUTANEOUS | Status: DC

## 2015-09-13 MED ORDER — ACETAMINOPHEN 325 MG PO TABS: 650.0000 mg | ORAL_TABLET | ORAL | Status: DC | PRN

## 2015-09-13 MED ORDER — FAMOTIDINE IN NACL 20-0.9 MG/50ML-% IV SOLN
20.0000 mg | Freq: Two times a day (BID) | INTRAVENOUS | Status: DC
  Administered 2015-09-13: 20 mg via INTRAVENOUS
  Filled 2015-09-13: qty 50

## 2015-09-13 MED ORDER — STROKE: EARLY STAGES OF RECOVERY BOOK
Freq: Once | Status: AC
  Administered 2015-09-13: 02:00:00
  Filled 2015-09-13: qty 1

## 2015-09-13 MED ORDER — FAMOTIDINE 20 MG PO TABS
20.0000 mg | ORAL_TABLET | Freq: Two times a day (BID) | ORAL | Status: DC
  Administered 2015-09-13: 20 mg via ORAL
  Filled 2015-09-13: qty 1

## 2015-09-13 MED ORDER — ALPRAZOLAM 0.5 MG PO TABS: 0.5000 mg | ORAL_TABLET | Freq: Three times a day (TID) | ORAL | Status: DC | PRN

## 2015-09-13 NOTE — Progress Notes (Signed)
   09/13/15 1345  Clinical Encounter Type  Visited With Patient  Visit Type Initial  Spiritual Encounters  Spiritual Needs Emotional;Prayer  Stress Factors  Patient Stress Factors Health changes  Chaplain on afternoon rounds visited with patient.  Chaplain spoke with wife and patient and shared a heartfelt conversation.  Patient shared a poem he had written.  Chaplain prayed for patient and his family.

## 2015-09-13 NOTE — Progress Notes (Signed)
[name] DOB: January 31, 1946 MRN: [id number]   Date of Service  09/13/2015  HPI/Events of Note  Not diabetic Eating and blood sugar 113  eICU Interventions  Stop SSI, glucose checks     Intervention Category Minor Interventions: Routine modifications to care plan (e.g. PRN medications for pain, fever)  [name] 09/13/2015, 1:50 AM

## 2015-09-13 NOTE — Care Management Note (Signed)
Case Management Note  Patient Details  Name: [name] MRN: [id number] Date of Birth: July 30, 1945  Subjective/Objective:Pt admitted on 09/12/15 with SDH and coagulopathy.  Pt for surgical intervention on 09/14/15.  PTA, pt independent, lives with spouse.                     Action/Plan: Will follow for discharge planning as pt progresses.    Expected Discharge Date:                  Expected Discharge Plan:  [name]  In-House Referral:     Discharge planning Services  CM Consult  Post Acute Care Choice:    Choice offered to:     DME Arranged:    DME Agency:     HH Arranged:    [place] Agency:     Status of Service:  In process, will continue to follow  If discussed at Long Length of Stay Meetings, dates discussed:    Additional Comments:  Reinaldo Raddle, RN, BSN  Trauma/Neuro ICU Case Manager [phone]

## 2015-09-13 NOTE — Consult Note (Signed)
PULMONARY / CRITICAL CARE MEDICINE   Name: [name] MRN: [id number] DOB: [birth year]    ADMISSION DATE:  09/12/2015 CONSULTATION DATE:  09/12/2015  REFERRING MD:  Dr. Saintclair Halsted  CHIEF COMPLAINT:  Headache  HISTORY OF PRESENT ILLNESS:   70 year old male with PMH as below, which is significant for prostate Ca s/p prostatectomy,chemo,radiotion. He remains on Zytiga daily since 05/13/2014. who presented to [facility] ED 7/17 with complaints of headache. He reports hitting his head twice about 2-3 weeks PTA, which he "vaguely recalls" doing. He has had R sided headaches since that time and more recently noticed left arm numbness of short duration which resolved spontaneously. Upon arrival to the ED he reportedly felt totally normal. He underwent CT scan of the head indicating right sided SDH with acute and subacute areas as well as midline shift 56mm. Neurosurgery believes he would benefit from craniotomy or burr hole, however he was coagulopathic so they planned to correct this first. PCCM was consulted to assist with medical management.   PAST MEDICAL HISTORY :  He  has a past medical history of Cancer ([place]).  PAST SURGICAL HISTORY: He  has no past surgical history on file.  Allergies  Allergen Reactions  . Penicillins     Has patient had a PCN reaction causing immediate rash, facial/tongue/throat swelling, SOB or lightheadedness with hypotension: Y Has patient had a PCN reaction causing severe rash involving mucus membranes or skin necrosis: Y Has patient had a PCN reaction that required hospitalization: N Has patient had a PCN reaction occurring within the last 10 years: N If all of the above answers are "NO", then may proceed with Cephalosporin use.     No current facility-administered medications on file prior to encounter.   Current Outpatient Prescriptions on File Prior to Encounter  Medication Sig  . Leuprolide Acetate (LUPRON DEPOT IM) Inject into the muscle every 4 (four) months.   . warfarin (COUMADIN) 5 MG tablet Take 15 mg by mouth daily.   Marland Kitchen ZYTIGA 250 MG tablet TAKE 4 TABLETS BY MOUTH DAILY ON AN EMPTY STOMACH 1 HOUR BEFORE OR 2 HOURS AFTER A MEAL  . abiraterone Acetate (ZYTIGA) 250 MG tablet Take 4 tablets (1,000 mg total) by mouth daily. Take on an empty stomach 1 hour before or 2 hours after a meal (Patient not taking: Reported on 09/12/2015)  . ALPRAZolam (XANAX) 0.5 MG tablet Take 0.5 mg by mouth 3 (three) times daily as needed for sleep.    FAMILY HISTORY:  His has no family status information on file.   SOCIAL HISTORY: He  reports that he has never smoked. He does not have any smokeless tobacco history on file. He reports that he drinks alcohol. He reports that he does not use illicit drugs.  REVIEW OF SYSTEMS:   ROS  Feels well with no chest pain, palpitation Denies any dyspnea, wheeze, cough, sputum No numbness, tingling,weakness, dizziness No chest pain, palpitations All other ROS are negatve  SUBJECTIVE:  Feels well, asking for breakfast  VITAL SIGNS: BP 138/88 mmHg  Pulse 66  Temp(Src) 98.2 F (36.8 C) (Oral)  Resp 18  Ht 6\' 3"  (1.905 m)  Wt 212 lb 11.2 oz (96.48 kg)  BMI 26.59 kg/m2  SpO2 96%  HEMODYNAMICS:    VENTILATOR SETTINGS:    INTAKE / OUTPUT: I/O last 3 completed shifts: In: 1186 [P.O.:240; I.V.:300; Blood:541; IV Piggyback:105] Out: 1050 [Urine:1050]  PHYSICAL EXAMINATION: General:  Awake, no distress Neuro:  No focal deficits HEENT:  Moist mucus membranes, No thyromegaly, JVD Cardiovascular:  S1, S2, RRR Lungs:  Clear, soft, + BS Abdomen:  Soft, + BS Musculoskeletal:  No edema Skin:  No rash  LABS:  BMET  Recent Labs Lab 09/12/15 2018  NA 140  K 3.6  CL 108  CO2 26  BUN 13  CREATININE 0.73  GLUCOSE 112*    Electrolytes  Recent Labs Lab 09/12/15 2018  CALCIUM 8.7*    CBC  Recent Labs Lab 09/12/15 2018  WBC 6.5  HGB 12.5*  HCT 37.1*  PLT 185    Coag's  Recent Labs Lab  09/12/15 2016 09/13/15 0115  INR 2.26* 1.87*    Sepsis Markers No results for input(s): LATICACIDVEN, PROCALCITON, O2SATVEN in the last 168 hours.  ABG No results for input(s): PHART, PCO2ART, PO2ART in the last 168 hours.  Liver Enzymes No results for input(s): AST, ALT, ALKPHOS, BILITOT, ALBUMIN in the last 168 hours.  Cardiac Enzymes No results for input(s): TROPONINI, PROBNP in the last 168 hours.  Glucose  Recent Labs Lab 09/13/15 0148  GLUCAP 113*    Imaging Dg Chest 2 View  09/12/2015  CLINICAL DATA:  Left acute anterior chest pain, history of atrial flutter. EXAM: CHEST  2 VIEW COMPARISON:  03/18/2004, 07/11/2006 FINDINGS: Normal heart size and vascularity. Stable mild hyperinflation without focal pneumonia, collapse or consolidation. Stable nodular right upper lobe pleural scarring with calcification. Trachea is midline. No acute osseous finding. Degenerative changes of the spine with a mild scoliosis. IMPRESSION: Stable chest exam. No superimposed acute process or significant change. Electronically Signed   By: Jerilynn Mages.  Shick M.D.   On: 09/12/2015 20:07   Ct Head Wo Contrast  09/12/2015  CLINICAL DATA:  Continuous acute headache for 1 day. EXAM: CT HEAD WITHOUT CONTRAST TECHNIQUE: Contiguous axial images were obtained from the base of the skull through the vertex without intravenous contrast. COMPARISON:  None available FINDINGS: Brain: There is a large complex lobulated right subdural hematoma with heterogeneous intermediate density and areas of hyperdensity. Appearance is compatible with a large right subacute subdural hematoma with areas of recent hemorrhage as well. Subdural hematoma thickness measures 25 mm, on the coronal reconstructions, image 33. This creates significant mass effect on the right cerebral hemisphere with diffuse sulcal effacement. The lobulation of the subdural hematoma may be related to complexity of the hemorrhage with septations or loculations. Difficult  to completely exclude underlying hemorrhagic dural mass, although felt to be less likely. The right subdural hematoma does create right to left midline shift measuring 3 mm, image 17. No associated hydrocephalus. Basilar cisterns remain patent. No significant herniation. Vascular: No hyperdense vessel or unexpected calcification. Skull: Negative for fracture or focal lesion. Sinuses/Orbits: Minor maxillary mucosal thickening. Other sinuses and mastoids remain clear. Other: None. IMPRESSION: Large complex and lobulated subacute right subdural hematoma with areas of recent hemorrhage as well, with diffuse mass effect on the right cerebral hemisphere and leftward midline shift of 3 mm. Consider MRI without and with contrast if there is concern for underlying dural mass or lesion. These results were called by telephone at the time of interpretation on 09/12/2015 at 9:50 pm to Dr. Blanchie Dessert , who verbally acknowledged these results. Electronically Signed   By: Jerilynn Mages.  Shick M.D.   On: 09/12/2015 21:52     STUDIES:  CT head 7/17 > Large complex and lobulated subacute right subdural hematoma with areas of recent hemorrhage as well, with diffuse mass effect on the right cerebral hemisphere and leftward  midline shift of 3 mm.  CULTURES:   ANTIBIOTICS:   SIGNIFICANT EVENTS:   LINES/TUBES:   DISCUSSION: 71 year old male with prostate Ca on daily Zytiga admitted 7/17 for with SDH to Dr. Saintclair Halsted.   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   Continue Zytiga for prostate ca  FAMILY  - Updates: Pt updated at bedside. - Inter-disciplinary family meet or Palliative Care meeting due by:  7/25  Marshell Garfinkel MD [facility] Pager [phone] If no answer or after 3pm call: [phone] 09/13/2015, 9:08 AM

## 2015-09-13 NOTE — Progress Notes (Signed)
Subjective: Patient reports Doing well no significant headache no more episodes of left arm numbness  Objective: Vital signs in last 24 hours: Temp:  [98 F (36.7 C)-98.6 F (37 C)] 98.5 F (36.9 C) (07/18 0630) Pulse Rate:  [56-113] 64 (07/18 0700) Resp:  [12-22] 14 (07/18 0700) BP: (106-176)/(56-128) 128/103 mmHg (07/18 0700) SpO2:  [95 %-100 %] 97 % (07/18 0700) Weight:  [96.48 kg (212 lb 11.2 oz)-97.523 kg (215 lb)] 96.48 kg (212 lb 11.2 oz) (07/18 0055)  Intake/Output from previous day: 07/17 0701 - 07/18 0700 In: 1186 [P.O.:240; I.V.:300; Blood:541; IV Piggyback:105] Out: 1050 [Urine:1050] Intake/Output this shift:    Awake alert oriented 4 strength out of 5 no pronator drift  Lab Results:  Recent Labs  09/12/15 2018  WBC 6.5  HGB 12.5*  HCT 37.1*  PLT 185   BMET  Recent Labs  09/12/15 2018  NA 140  K 3.6  CL 108  CO2 26  GLUCOSE 112*  BUN 13  CREATININE 0.73  CALCIUM 8.7*    Studies/Results: Dg Chest 2 View  09/12/2015  CLINICAL DATA:  Left acute anterior chest pain, history of atrial flutter. EXAM: CHEST  2 VIEW COMPARISON:  03/18/2004, 07/11/2006 FINDINGS: Normal heart size and vascularity. Stable mild hyperinflation without focal pneumonia, collapse or consolidation. Stable nodular right upper lobe pleural scarring with calcification. Trachea is midline. No acute osseous finding. Degenerative changes of the spine with a mild scoliosis. IMPRESSION: Stable chest exam. No superimposed acute process or significant change. Electronically Signed   By: Jerilynn Mages.  Shick M.D.   On: 09/12/2015 20:07   Ct Head Wo Contrast  09/12/2015  CLINICAL DATA:  Continuous acute headache for 1 day. EXAM: CT HEAD WITHOUT CONTRAST TECHNIQUE: Contiguous axial images were obtained from the base of the skull through the vertex without intravenous contrast. COMPARISON:  None available FINDINGS: Brain: There is a large complex lobulated right subdural hematoma with heterogeneous  intermediate density and areas of hyperdensity. Appearance is compatible with a large right subacute subdural hematoma with areas of recent hemorrhage as well. Subdural hematoma thickness measures 25 mm, on the coronal reconstructions, image 33. This creates significant mass effect on the right cerebral hemisphere with diffuse sulcal effacement. The lobulation of the subdural hematoma may be related to complexity of the hemorrhage with septations or loculations. Difficult to completely exclude underlying hemorrhagic dural mass, although felt to be less likely. The right subdural hematoma does create right to left midline shift measuring 3 mm, image 17. No associated hydrocephalus. Basilar cisterns remain patent. No significant herniation. Vascular: No hyperdense vessel or unexpected calcification. Skull: Negative for fracture or focal lesion. Sinuses/Orbits: Minor maxillary mucosal thickening. Other sinuses and mastoids remain clear. Other: None. IMPRESSION: Large complex and lobulated subacute right subdural hematoma with areas of recent hemorrhage as well, with diffuse mass effect on the right cerebral hemisphere and leftward midline shift of 3 mm. Consider MRI without and with contrast if there is concern for underlying dural mass or lesion. These results were called by telephone at the time of interpretation on 09/12/2015 at 9:50 pm to Dr. Blanchie Dessert , who verbally acknowledged these results. Electronically Signed   By: Jerilynn Mages.  Shick M.D.   On: 09/12/2015 21:52    Assessment/Plan: Continue to correct his coagulopathy plan [facility] versus craniotomy for subdural tomorrow.  LOS: 1 day     Delainie Chavana P 09/13/2015, 7:54 AM

## 2015-09-14 ENCOUNTER — Encounter (HOSPITAL_COMMUNITY)
Admission: EM | Disposition: A | Discharge status: Home Health | Payer: Self-pay | Source: Home / Self Care | Attending: Neurosurgery

## 2015-09-14 ENCOUNTER — Inpatient Hospital Stay (HOSPITAL_COMMUNITY): Payer: Medicare Other | Admitting: Anesthesiology

## 2015-09-14 ENCOUNTER — Encounter (HOSPITAL_COMMUNITY): Payer: Self-pay | Admitting: Anesthesiology

## 2015-09-14 DIAGNOSIS — T45515A Adverse effect of anticoagulants, initial encounter: Secondary | ICD-10-CM

## 2015-09-14 DIAGNOSIS — E876 Hypokalemia: Secondary | ICD-10-CM

## 2015-09-14 DIAGNOSIS — I82409 Acute embolism and thrombosis of unspecified deep veins of unspecified lower extremity: Secondary | ICD-10-CM

## 2015-09-14 DIAGNOSIS — I10 Essential (primary) hypertension: Secondary | ICD-10-CM

## 2015-09-14 HISTORY — PX: BURR HOLE: SHX908

## 2015-09-14 LAB — BASIC METABOLIC PANEL
ANION GAP: 5 (ref 5–15)
BUN: 7 mg/dL (ref 6–20)
CALCIUM: 8.8 mg/dL — AB (ref 8.9–10.3)
CO2: 26 mmol/L (ref 22–32)
Chloride: 111 mmol/L (ref 101–111)
Creatinine, Ser: 0.67 mg/dL (ref 0.61–1.24)
GFR calc non Af Amer: >60 mL/min (ref >60)
GLUCOSE: 98 mg/dL (ref 65–99)
POTASSIUM: 2.9 mmol/L — AB (ref 3.5–5.1)
SODIUM: 142 mmol/L (ref 135–145)

## 2015-09-14 LAB — PREPARE FRESH FROZEN PLASMA
UNIT DIVISION: 0
Unit division: 0

## 2015-09-14 LAB — PROTIME-INR
INR: 1.11 (ref 0.00–1.49)
Prothrombin Time: 14.5 seconds (ref 11.6–15.2)

## 2015-09-14 LAB — MAGNESIUM: MAGNESIUM: 1.9 mg/dL (ref 1.7–2.4)

## 2015-09-14 SURGERY — CREATION, CRANIAL BURR HOLE
Anesthesia: General | Site: Head | Laterality: Right

## 2015-09-14 MED ORDER — PHENYLEPHRINE HCL 10 MG/ML IJ SOLN
10.0000 mg | INTRAVENOUS | Status: DC | PRN
  Administered 2015-09-14: 25 ug/min via INTRAVENOUS

## 2015-09-14 MED ORDER — POTASSIUM CHLORIDE IN NACL 20-0.45 MEQ/L-% IV SOLN
INTRAVENOUS | Status: DC
  Administered 2015-09-14 – 2015-09-15 (×2): via INTRAVENOUS
  Filled 2015-09-14 (×4): qty 1000

## 2015-09-14 MED ORDER — MICROFIBRILLAR COLL HEMOSTAT EX PADS
MEDICATED_PAD | CUTANEOUS | Status: DC | PRN
  Administered 2015-09-14: 1 via TOPICAL

## 2015-09-14 MED ORDER — ROCURONIUM BROMIDE 100 MG/10ML IV SOLN
INTRAVENOUS | Status: DC | PRN
  Administered 2015-09-14: 50 mg via INTRAVENOUS

## 2015-09-14 MED ORDER — THROMBIN 20000 UNITS EX SOLR
CUTANEOUS | Status: DC | PRN
  Administered 2015-09-14: 20 mL via TOPICAL

## 2015-09-14 MED ORDER — FAMOTIDINE IN NACL 20-0.9 MG/50ML-% IV SOLN
20.0000 mg | INTRAVENOUS | Status: DC
  Administered 2015-09-14: 20 mg via INTRAVENOUS
  Filled 2015-09-14: qty 50

## 2015-09-14 MED ORDER — FENTANYL CITRATE (PF) 100 MCG/2ML IJ SOLN
INTRAMUSCULAR | Status: DC | PRN
  Administered 2015-09-14: 200 ug via INTRAVENOUS

## 2015-09-14 MED ORDER — SODIUM CHLORIDE 0.9 % IR SOLN
Status: DC | PRN
  Administered 2015-09-14: 500 mL

## 2015-09-14 MED ORDER — 0.9 % SODIUM CHLORIDE (POUR BTL) OPTIME
TOPICAL | Status: DC | PRN
  Administered 2015-09-14 (×2): 1000 mL

## 2015-09-14 MED ORDER — WHITE PETROLATUM GEL
Status: AC
  Administered 2015-09-14: 0.2
  Filled 2015-09-14: qty 1

## 2015-09-14 MED ORDER — LIDOCAINE HCL (CARDIAC) 20 MG/ML IV SOLN
INTRAVENOUS | Status: DC | PRN
  Administered 2015-09-14: 50 mg via INTRAVENOUS

## 2015-09-14 MED ORDER — VANCOMYCIN HCL 1000 MG IV SOLR
1000.0000 mg | INTRAVENOUS | Status: DC | PRN
  Administered 2015-09-14: 1000 mg via INTRAVENOUS

## 2015-09-14 MED ORDER — FENTANYL CITRATE (PF) 250 MCG/5ML IJ SOLN
INTRAMUSCULAR | Status: AC
  Filled 2015-09-14: qty 5

## 2015-09-14 MED ORDER — MEPERIDINE HCL 25 MG/ML IJ SOLN: 6.2500 mg | INTRAMUSCULAR | Status: DC | PRN

## 2015-09-14 MED ORDER — ACETAMINOPHEN 325 MG PO TABS
650.0000 mg | ORAL_TABLET | ORAL | Status: DC | PRN
  Administered 2015-09-15: 650 mg via ORAL
  Filled 2015-09-14: qty 2

## 2015-09-14 MED ORDER — HYDROMORPHONE HCL 1 MG/ML IJ SOLN
INTRAMUSCULAR | Status: AC
  Filled 2015-09-14: qty 1

## 2015-09-14 MED ORDER — PROPOFOL 10 MG/ML IV BOLUS
INTRAVENOUS | Status: AC
  Filled 2015-09-14: qty 20

## 2015-09-14 MED ORDER — HYDROMORPHONE HCL 1 MG/ML IJ SOLN
0.2500 mg | INTRAMUSCULAR | Status: DC | PRN
  Administered 2015-09-14: 0.5 mg via INTRAVENOUS

## 2015-09-14 MED ORDER — ACETAMINOPHEN 650 MG RE SUPP: 650.0000 mg | RECTAL | Status: DC | PRN

## 2015-09-14 MED ORDER — ONDANSETRON HCL 4 MG PO TABS: 4.0000 mg | ORAL_TABLET | ORAL | Status: DC | PRN

## 2015-09-14 MED ORDER — EPHEDRINE SULFATE 50 MG/ML IJ SOLN
INTRAMUSCULAR | Status: DC | PRN
  Administered 2015-09-14: 10 mg via INTRAVENOUS

## 2015-09-14 MED ORDER — ONDANSETRON HCL 4 MG/2ML IJ SOLN: 4.0000 mg | Freq: Once | INTRAMUSCULAR | Status: DC | PRN

## 2015-09-14 MED ORDER — POTASSIUM CHLORIDE 10 MEQ/100ML IV SOLN
INTRAVENOUS | Status: DC | PRN
  Administered 2015-09-14 (×2): 10 meq via INTRAVENOUS

## 2015-09-14 MED ORDER — POTASSIUM CHLORIDE 10 MEQ/100ML IV SOLN
10.0000 meq | INTRAVENOUS | Status: AC
  Filled 2015-09-14 (×2): qty 100

## 2015-09-14 MED ORDER — LABETALOL HCL 5 MG/ML IV SOLN: 10.0000 mg | INTRAVENOUS | Status: DC | PRN

## 2015-09-14 MED ORDER — ONDANSETRON HCL 4 MG/2ML IJ SOLN
INTRAMUSCULAR | Status: DC | PRN
  Administered 2015-09-14: 4 mg via INTRAVENOUS

## 2015-09-14 MED ORDER — FAMOTIDINE IN NACL 20-0.9 MG/50ML-% IV SOLN
20.0000 mg | Freq: Two times a day (BID) | INTRAVENOUS | Status: DC
  Administered 2015-09-15 (×2): 20 mg via INTRAVENOUS
  Filled 2015-09-14 (×2): qty 50

## 2015-09-14 MED ORDER — BACITRACIN ZINC 500 UNIT/GM EX OINT
TOPICAL_OINTMENT | CUTANEOUS | Status: DC | PRN
  Administered 2015-09-14: 1 via TOPICAL

## 2015-09-14 MED ORDER — PROPOFOL 10 MG/ML IV BOLUS
INTRAVENOUS | Status: DC | PRN
  Administered 2015-09-14: 150 mg via INTRAVENOUS

## 2015-09-14 MED ORDER — LIDOCAINE-EPINEPHRINE 1 %-1:100000 IJ SOLN
INTRAMUSCULAR | Status: DC | PRN
  Administered 2015-09-14: 6 mL via INTRADERMAL

## 2015-09-14 MED ORDER — ONDANSETRON HCL 4 MG/2ML IJ SOLN
4.0000 mg | INTRAMUSCULAR | Status: DC | PRN
  Administered 2015-09-15: 4 mg via INTRAVENOUS
  Filled 2015-09-14: qty 2

## 2015-09-14 MED ORDER — LEVETIRACETAM 500 MG/5ML IV SOLN
500.0000 mg | Freq: Two times a day (BID) | INTRAVENOUS | Status: DC
  Administered 2015-09-14 – 2015-09-15 (×3): 500 mg via INTRAVENOUS
  Filled 2015-09-14 (×5): qty 5

## 2015-09-14 MED ORDER — LABETALOL HCL 5 MG/ML IV SOLN
INTRAVENOUS | Status: AC
  Administered 2015-09-14: 10 mg
  Filled 2015-09-14: qty 4

## 2015-09-14 MED ORDER — PROMETHAZINE HCL 25 MG PO TABS: 12.5000 mg | ORAL_TABLET | ORAL | Status: DC | PRN

## 2015-09-14 MED ORDER — VANCOMYCIN HCL IN DEXTROSE 1-5 GM/200ML-% IV SOLN
INTRAVENOUS | Status: AC
  Filled 2015-09-14: qty 200

## 2015-09-14 MED ORDER — HEMOSTATIC AGENTS (NO CHARGE) OPTIME
TOPICAL | Status: DC | PRN
  Administered 2015-09-14: 1 via TOPICAL

## 2015-09-14 MED ORDER — HYDROMORPHONE HCL 1 MG/ML IJ SOLN
0.5000 mg | INTRAMUSCULAR | Status: DC | PRN
  Administered 2015-09-15: 1 mg via INTRAVENOUS
  Filled 2015-09-14: qty 1

## 2015-09-14 MED ORDER — SUGAMMADEX SODIUM 200 MG/2ML IV SOLN
INTRAVENOUS | Status: DC | PRN
  Administered 2015-09-14: 200 mg via INTRAVENOUS

## 2015-09-14 MED ORDER — VANCOMYCIN HCL IN DEXTROSE 1-5 GM/200ML-% IV SOLN
1000.0000 mg | Freq: Three times a day (TID) | INTRAVENOUS | Status: AC
  Administered 2015-09-15 (×2): 1000 mg via INTRAVENOUS
  Filled 2015-09-14 (×2): qty 200

## 2015-09-14 SURGICAL SUPPLY — 82 items
BAG DECANTER FOR FLEXI CONT (MISCELLANEOUS) ×3 IMPLANT
BANDAGE GAUZE 4  KLING STR (GAUZE/BANDAGES/DRESSINGS) IMPLANT
BLADE CLIPPER SURG (BLADE) IMPLANT
BLADE SURG 11 STRL SS (BLADE) ×3 IMPLANT
BNDG COHESIVE 4X5 TAN NS LF (GAUZE/BANDAGES/DRESSINGS) IMPLANT
BRUSH SCRUB EZ PLAIN DRY (MISCELLANEOUS) ×3 IMPLANT
BUR ACORN 9.0 PRECISION (BURR) ×2 IMPLANT
BUR ACORN 9.0MM PRECISION (BURR) ×1
BUR ADDG 1.1 (BURR) IMPLANT
BUR ADDG 1.1MM (BURR)
CANISTER SUCT 3000ML PPV (MISCELLANEOUS) ×3 IMPLANT
CLIP TI MEDIUM 6 (CLIP) IMPLANT
CORDS BIPOLAR (ELECTRODE) ×3 IMPLANT
DECANTER SPIKE VIAL GLASS SM (MISCELLANEOUS) ×3 IMPLANT
DRAIN SNY WOU 7FLT (WOUND CARE) ×2 IMPLANT
DRAPE NEUROLOGICAL W/INCISE (DRAPES) IMPLANT
DRAPE SURG 17X23 STRL (DRAPES) IMPLANT
DRAPE WARM FLUID 44X44 (DRAPE) ×3 IMPLANT
ELECT CAUTERY BLADE 6.4 (BLADE) ×3 IMPLANT
ELECT REM PT RETURN 9FT ADLT (ELECTROSURGICAL) ×3
ELECTRODE REM PT RTRN 9FT ADLT (ELECTROSURGICAL) ×1 IMPLANT
EVACUATOR SILICONE 100CC (DRAIN) ×2 IMPLANT
GAUZE SPONGE 4X4 12PLY STRL (GAUZE/BANDAGES/DRESSINGS) ×2 IMPLANT
GAUZE SPONGE 4X4 16PLY XRAY LF (GAUZE/BANDAGES/DRESSINGS) IMPLANT
GLOVE BIO SURGEON STRL SZ 6.5 (GLOVE) ×4 IMPLANT
GLOVE BIO SURGEON STRL SZ7 (GLOVE) ×4 IMPLANT
GLOVE BIO SURGEON STRL SZ7.5 (GLOVE) IMPLANT
GLOVE BIO SURGEON STRL SZ8 (GLOVE) ×5 IMPLANT
GLOVE BIO SURGEON STRL SZ8.5 (GLOVE) ×2 IMPLANT
GLOVE BIO SURGEONS STRL SZ 6.5 (GLOVE) ×4
GLOVE BIOGEL M 8.0 STRL (GLOVE) IMPLANT
GLOVE ECLIPSE 6.5 STRL STRAW (GLOVE) IMPLANT
GLOVE ECLIPSE 7.0 STRL STRAW (GLOVE) IMPLANT
GLOVE ECLIPSE 7.5 STRL STRAW (GLOVE) IMPLANT
GLOVE ECLIPSE 8.0 STRL XLNG CF (GLOVE) IMPLANT
GLOVE ECLIPSE 8.5 STRL (GLOVE) IMPLANT
GLOVE EXAM NITRILE LRG STRL (GLOVE) IMPLANT
GLOVE EXAM NITRILE MD LF STRL (GLOVE) IMPLANT
GLOVE EXAM NITRILE XL STR (GLOVE) IMPLANT
GLOVE EXAM NITRILE XS STR PU (GLOVE) IMPLANT
GLOVE INDICATOR 6.5 STRL GRN (GLOVE) ×4 IMPLANT
GLOVE INDICATOR 7.0 STRL GRN (GLOVE) IMPLANT
GLOVE INDICATOR 7.5 STRL GRN (GLOVE) IMPLANT
GLOVE INDICATOR 8.0 STRL GRN (GLOVE) IMPLANT
GLOVE INDICATOR 8.5 STRL (GLOVE) ×3 IMPLANT
GLOVE OPTIFIT SS 8.0 STRL (GLOVE) IMPLANT
GLOVE SURG SS PI 6.5 STRL IVOR (GLOVE) IMPLANT
GOWN STRL REUS W/ TWL LRG LVL3 (GOWN DISPOSABLE) ×1 IMPLANT
GOWN STRL REUS W/ TWL XL LVL3 (GOWN DISPOSABLE) ×1 IMPLANT
GOWN STRL REUS W/TWL 2XL LVL3 (GOWN DISPOSABLE) ×3 IMPLANT
GOWN STRL REUS W/TWL LRG LVL3 (GOWN DISPOSABLE) ×9
GOWN STRL REUS W/TWL XL LVL3 (GOWN DISPOSABLE) ×3
HEMOSTAT SURGICEL 2X14 (HEMOSTASIS) IMPLANT
HOOK DURA (MISCELLANEOUS) ×3 IMPLANT
KIT BASIN OR (CUSTOM PROCEDURE TRAY) ×3 IMPLANT
KIT ROOM TURNOVER OR (KITS) ×3 IMPLANT
LIQUID BAND (GAUZE/BANDAGES/DRESSINGS) ×3 IMPLANT
NDL HYPO 25X1 1.5 SAFETY (NEEDLE) ×1 IMPLANT
NEEDLE HYPO 25X1 1.5 SAFETY (NEEDLE) ×3 IMPLANT
NS IRRIG 1000ML POUR BTL (IV SOLUTION) ×3 IMPLANT
PACK CRANIOTOMY (CUSTOM PROCEDURE TRAY) ×3 IMPLANT
PAD ARMBOARD 7.5X6 YLW CONV (MISCELLANEOUS) ×9 IMPLANT
PATTIES SURGICAL .25X.25 (GAUZE/BANDAGES/DRESSINGS) IMPLANT
PATTIES SURGICAL .5 X.5 (GAUZE/BANDAGES/DRESSINGS) IMPLANT
PATTIES SURGICAL .5 X3 (DISPOSABLE) IMPLANT
PATTIES SURGICAL 1X1 (DISPOSABLE) IMPLANT
PIN MAYFIELD SKULL DISP (PIN) IMPLANT
PLATE 1.5  2HOLE LNG NEURO (Plate) ×2 IMPLANT
PLATE 1.5 2HOLE LNG NEURO (Plate) IMPLANT
PLATE 1.5 6HOLE XLONG DBL Y (Plate) ×4 IMPLANT
SCREW SELF DRILL HT 1.5/4MM (Screw) ×20 IMPLANT
SPONGE NEURO XRAY DETECT 1X3 (DISPOSABLE) IMPLANT
SPONGE SURGIFOAM ABS GEL 100 (HEMOSTASIS) IMPLANT
STAPLER VISISTAT 35W (STAPLE) ×3 IMPLANT
SUT NURALON 4 0 TR CR/8 (SUTURE) ×6 IMPLANT
SUT VIC AB 2-0 CT1 18 (SUTURE) ×3 IMPLANT
SYR CONTROL 10ML LL (SYRINGE) ×3 IMPLANT
TAPE CLOTH SURG 4X10 WHT LF (GAUZE/BANDAGES/DRESSINGS) ×2 IMPLANT
TOWEL OR 17X24 6PK STRL BLUE (TOWEL DISPOSABLE) ×3 IMPLANT
TOWEL OR 17X26 10 PK STRL BLUE (TOWEL DISPOSABLE) ×3 IMPLANT
TRAY FOLEY W/METER SILVER 16FR (SET/KITS/TRAYS/PACK) IMPLANT
WATER STERILE IRR 1000ML POUR (IV SOLUTION) ×3 IMPLANT

## 2015-09-14 NOTE — Progress Notes (Signed)
   09/14/15 0850  Clinical Encounter Type  Visited With Patient  Visit Type Follow-up  Spiritual Encounters  Spiritual Needs Prayer  Chaplain on morning rounds made a follow up visit to patient.  Chaplain had prayer with patient regarding surgery scheduled for this afternoon.  Chaplain made further support available as needed.

## 2015-09-14 NOTE — Anesthesia Preprocedure Evaluation (Signed)
Anesthesia Evaluation  Patient identified by MRN, date of birth, ID band Patient awake    Reviewed: Allergy & Precautions, NPO status , Patient's Chart, lab work & pertinent test results  Airway Mallampati: I  TM Distance: >3 FB Neck ROM: Full    Dental   Pulmonary    Pulmonary exam normal        Cardiovascular Normal cardiovascular exam     Neuro/Psych Subdural Hematoma    GI/Hepatic   Endo/Other    Renal/GU      Musculoskeletal   Abdominal   Peds  Hematology   Anesthesia Other Findings   Reproductive/Obstetrics                             Anesthesia Physical Anesthesia Plan  ASA: III  Anesthesia Plan: General   Post-op Pain Management:    Induction: Intravenous  Airway Management Planned:   Additional Equipment: Arterial line  Intra-op Plan:   Post-operative Plan: Extubation in OR  Informed Consent: I have reviewed the patients History and Physical, chart, labs and discussed the procedure including the risks, benefits and alternatives for the proposed anesthesia with the patient or authorized representative who has indicated his/her understanding and acceptance.     Plan Discussed with: CRNA and Surgeon  Anesthesia Plan Comments:         Anesthesia Quick Evaluation

## 2015-09-14 NOTE — Anesthesia Procedure Notes (Signed)
Procedure Name: Intubation Date/Time: 09/14/2015 8:00 PM Performed by: Eligha Bridegroom Pre-anesthesia Checklist: Emergency Drugs available, Suction available, Patient identified, Patient being monitored and Timeout performed Patient Re-evaluated:Patient Re-evaluated prior to inductionOxygen Delivery Method: Circle system utilized Preoxygenation: Pre-oxygenation with 100% oxygen Intubation Type: IV induction Ventilation: Mask ventilation without difficulty Laryngoscope Size: Mac and 3 Grade View: Grade I Tube type: Subglottic suction tube Tube size: 7.5 mm Airway Equipment and Method: Stylet Placement Confirmation: breath sounds checked- equal and bilateral,  positive ETCO2 and ETT inserted through vocal cords under direct vision Secured at: 22 cm Tube secured with: Tape Dental Injury: Teeth and Oropharynx as per pre-operative assessment

## 2015-09-14 NOTE — Progress Notes (Signed)
Patient ID: [name], male   DOB: [birth year], 70 y.o.   MRN: [id number] Patient doing well   Awake alert oriented neurologically nonfocal   he is nothing by mouth his INR is 1.1 he set up for craniotomy for evacuation of right-sided subdural hematoma. I've extensively gone over the risks and benefits of the procedure the patient as well as perioperative course expectations of outcome and alternatives to surgery and he understands and agrees to proceed forward.

## 2015-09-14 NOTE — Op Note (Signed)
Preoperative diagnosis: Right frontoparietal subdural hematoma subacute  Postoperative diagnosis: Same  Procedure: Right frontoparietal craniotomy for evacuation of a subacute subdural hematoma  Surgeon: Dominica Severin Hind Chesler  Asst.: Newman Pies  Anesthesia: Gen. 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 Workup revealed a large right frontoparietal subdural hematoma so we reversed his coagulopathy and taken to the OR recommended evacuation of right frontoparietal subdural hematoma. I extensively reviewed the risks and benefits of the operation the patient as well as perioperative course expectations of outcome alternatives of surgery and he understood and agreed to proceed forward.  Operative procedure: Patient was brought into the or was induced on general anesthesia positioned supine the shoulder bump under his right shoulder his head turned the left exposing the right side of his head I then shaved a linear patch along the superotemporal line and draped in the way prepped and draped in routine sterile fashion. After infiltration of 6 mL lidocaine with epi and incision was made and then I drill a small posterior burr hole and internus small craniotomy flap the dura was noted be tense and under pressure then incised the dura in a cruciate fashion and dark blood came out under pressure and copiously irrigated the wound with a red rubber catheter as well as aseptic syringes. Then when no more membranes were appreciated and could visualize cortex and all the irrigant was clear I then placed a JP drain in the subdural space closed the dura with interrupted Nurolon's but the flap on with the Biomet plating system and then closed the wound with interrupted Vicryl's and staples in the skin. At the end of case all needle  counts sponge counts were correct.

## 2015-09-14 NOTE — Consult Note (Signed)
PULMONARY / CRITICAL CARE MEDICINE   Name: [name] MRN: [id number] DOB: 09-Jan-1946    ADMISSION DATE:  09/12/2015 CONSULTATION DATE:  09/12/2015  REFERRING MD:  Dr. Saintclair Halsted  CHIEF COMPLAINT:  Headache  HISTORY OF PRESENT ILLNESS:   70 year old male with PMH as below, which is significant for prostate Ca s/p prostatectomy,chemo,radiotion. He remains on Zytiga daily since 05/13/2014. who presented to [facility] ED 7/17 with complaints of headache. He reports hitting his head twice about 2-3 weeks PTA, which he "vaguely recalls" doing. He has had R sided headaches since that time and more recently noticed left arm numbness of short duration which resolved spontaneously. Upon arrival to the ED he reportedly felt totally normal. He underwent CT scan of the head indicating right sided SDH with acute and subacute areas as well as midline shift 25mm. Neurosurgery believes he would benefit from craniotomy or burr hole, however he was coagulopathic so they planned to correct this first. PCCM was consulted to assist with medical management.   PAST MEDICAL HISTORY :  He  has a past medical history of Cancer ([place]).  PAST SURGICAL HISTORY: He  has no past surgical history on file.  Allergies  Allergen Reactions  . Penicillins     Has patient had a PCN reaction causing immediate rash, facial/tongue/throat swelling, SOB or lightheadedness with hypotension: Y Has patient had a PCN reaction causing severe rash involving mucus membranes or skin necrosis: Y Has patient had a PCN reaction that required hospitalization: N Has patient had a PCN reaction occurring within the last 10 years: N If all of the above answers are "NO", then may proceed with Cephalosporin use.     No current facility-administered medications on file prior to encounter.   Current Outpatient Prescriptions on File Prior to Encounter  Medication Sig  . Leuprolide Acetate (LUPRON DEPOT IM) Inject into the muscle every 4 (four) months.   . warfarin (COUMADIN) 5 MG tablet Take 15 mg by mouth daily.   Marland Kitchen ZYTIGA 250 MG tablet TAKE 4 TABLETS BY MOUTH DAILY ON AN EMPTY STOMACH 1 HOUR BEFORE OR 2 HOURS AFTER A MEAL  . abiraterone Acetate (ZYTIGA) 250 MG tablet Take 4 tablets (1,000 mg total) by mouth daily. Take on an empty stomach 1 hour before or 2 hours after a meal (Patient not taking: Reported on 09/12/2015)  . ALPRAZolam (XANAX) 0.5 MG tablet Take 0.5 mg by mouth 3 (three) times daily as needed for sleep.    FAMILY HISTORY:  His has no family status information on file.   SOCIAL HISTORY: He  reports that he has never smoked. He does not have any smokeless tobacco history on file. He reports that he drinks alcohol. He reports that he does not use illicit drugs.  REVIEW OF SYSTEMS:   ROS  Feels well with no chest pain, palpitation Denies any dyspnea, wheeze, cough, sputum No numbness, tingling,weakness, dizziness No chest pain, palpitations All other ROS are negatve  SUBJECTIVE:  Feels well, asking for breakfast  VITAL SIGNS: BP 144/99 mmHg  Pulse 66  Temp(Src) 98.5 F (36.9 C) (Oral)  Resp 21  Ht 6\' 3"  (1.905 m)  Wt 212 lb 11.2 oz (96.48 kg)  BMI 26.59 kg/m2  SpO2 99%  HEMODYNAMICS:    VENTILATOR SETTINGS:    INTAKE / OUTPUT: I/O last 3 completed shifts: In: [id number] [P.O.:960; I.V.:1500; Blood:541; IV Piggyback:365] Out: [id number] [Urine:4180]  PHYSICAL EXAMINATION: General:  Awake, no distress Neuro:  No focal deficits HEENT:  Moist mucus membranes, No thyromegaly, JVD Cardiovascular:  S1, S2, RRR Lungs:  Clear, soft, + BS Abdomen:  Soft, + BS Musculoskeletal:  No edema Skin:  No rash  LABS:  BMET  Recent Labs Lab 09/12/15 2018 09/13/15 1117  NA 140 140  K 3.6 3.0*  CL 108 108  CO2 26 28  BUN 13 8  CREATININE 0.73 0.72  GLUCOSE 112* 102*    Electrolytes  Recent Labs Lab 09/12/15 2018 09/13/15 1117  CALCIUM 8.7* 8.4*    CBC  Recent Labs Lab 09/12/15 2018 09/13/15 1117  WBC  6.5 6.1  HGB 12.5* 11.6*  HCT 37.1* 34.4*  PLT 185 172    Coag's  Recent Labs Lab 09/13/15 0115 09/13/15 1117 09/14/15 0424  APTT  --  30  --   INR 1.87* 1.28 1.11    Sepsis Markers No results for input(s): LATICACIDVEN, PROCALCITON, O2SATVEN in the last 168 hours.  ABG No results for input(s): PHART, PCO2ART, PO2ART in the last 168 hours.  Liver Enzymes  Recent Labs Lab 09/13/15 1117  AST 19  ALT 17  ALKPHOS 54  BILITOT 1.2  ALBUMIN 3.2*    Cardiac Enzymes No results for input(s): TROPONINI, PROBNP in the last 168 hours.  Glucose  Recent Labs Lab 09/13/15 0148  GLUCAP 113*    Imaging No results found.   STUDIES:  CT head 7/17 > Large complex and lobulated subacute right subdural hematoma with areas of recent hemorrhage as well, with diffuse mass effect on the right cerebral hemisphere and leftward midline shift of 3 mm.  CULTURES:   ANTIBIOTICS:   SIGNIFICANT EVENTS:   LINES/TUBES:   DISCUSSION: 70 year old male with prostate Ca on daily Zytiga admitted 7/17 for with SDH to Dr. Saintclair Halsted.   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   Continue Zytiga for prostate ca  FAMILY  - Updates: Pt updated at bedside. - Inter-disciplinary family meet  or Palliative Care meeting due by:  7/25  Refer to progress note by Dr. Ashok Cordia same day.  09/14/2015, 10:24 AM

## 2015-09-14 NOTE — Progress Notes (Signed)
PULMONARY / CRITICAL CARE MEDICINE   Name: [name] MRN: [id number] DOB: 1945/09/24    ADMISSION DATE:  09/12/2015 CONSULTATION DATE:  09/12/2015   REFERRING MD:  Dr. Saintclair Halsted  CHIEF COMPLAINT:  Medical Mgt  HISTORY OF PRESENT ILLNESS:   70 year old male with PMH as below, which is significant for prostate Ca s/p prostatectomy,chemo,radiotion. He remains on Zytiga daily since 05/13/2014. who presented to [facility] ED 7/17 with complaints of headache. He reports hitting his head twice about 2-3 weeks PTA, which he "vaguely recalls" doing. He has had R sided headaches since that time and more recently noticed left arm numbness of short duration which resolved spontaneously. Upon arrival to the ED he reportedly felt totally normal. He underwent CT scan of the head indicating right sided SDH with acute and subacute areas as well as midline shift 47mm. Neurosurgery believes he would benefit from craniotomy or burr hole, however he was coagulopathic so they planned to correct this first. PCCM was consulted to assist with medical management.   SUBJECTIVE: Patient denies any new weakness, numbness, or tingling. Daily in his left hand has resolved. Denies any significant headache. Denies any nausea or vomiting.  REVIEW OF SYSTEMS:  No subjective fever, chills, or sweats. No chest pain or pressure. No dyspnea or cough.  VITAL SIGNS: BP 144/99 mmHg  Pulse 66  Temp(Src) 98.5 F (36.9 C) (Oral)  Resp 21  Ht 6\' 3"  (1.905 m)  Wt 212 lb 11.2 oz (96.48 kg)  BMI 26.59 kg/m2  SpO2 99%  HEMODYNAMICS:    VENTILATOR SETTINGS:    INTAKE / OUTPUT: I/O last 3 completed shifts: In: [id number] [P.O.:960; I.V.:1500; Blood:541; IV Piggyback:365] Out: [id number] [Urine:4180]  PHYSICAL EXAMINATION: General:  Awake. Alert. No acute distress. Sitting up in chair watching TV.  Integument:  Warm & dry. No rash on exposed skin. No bruising on exposed skin. HEENT:  Moist mucus membranes. No oral ulcers. No scleral injection  or icterus.  Cardiovascular:  Regular rate. No edema. No appreciable JVD.  Pulmonary:  Good aeration & clear to auscultation bilaterally. Symmetric chest wall expansion. No accessory muscle use on room air. Abdomen: Soft. Normal bowel sounds. Nondistended. Grossly nontender. Musculoskeletal:  Normal bulk and tone. Hand grip strength 5/5 bilaterally. No joint deformity or effusion appreciated. Neurological: Oriented 4. No meningismus. Grossly nonfocal.  LABS:  BMET  Recent Labs Lab 09/12/15 2018 09/13/15 1117  NA 140 140  K 3.6 3.0*  CL 108 108  CO2 26 28  BUN 13 8  CREATININE 0.73 0.72  GLUCOSE 112* 102*    Electrolytes  Recent Labs Lab 09/12/15 2018 09/13/15 1117  CALCIUM 8.7* 8.4*    CBC  Recent Labs Lab 09/12/15 2018 09/13/15 1117  WBC 6.5 6.1  HGB 12.5* 11.6*  HCT 37.1* 34.4*  PLT 185 172    Coag's  Recent Labs Lab 09/13/15 0115 09/13/15 1117 09/14/15 0424  APTT  --  30  --   INR 1.87* 1.28 1.11    Sepsis Markers No results for input(s): LATICACIDVEN, PROCALCITON, O2SATVEN in the last 168 hours.  ABG No results for input(s): PHART, PCO2ART, PO2ART in the last 168 hours.  Liver Enzymes  Recent Labs Lab 09/13/15 1117  AST 19  ALT 17  ALKPHOS 54  BILITOT 1.2  ALBUMIN 3.2*    Cardiac Enzymes No results for input(s): TROPONINI, PROBNP in the last 168 hours.  Glucose  Recent Labs Lab 09/13/15 0148  GLUCAP 113*    Imaging No  results found.   STUDIES:  CT Head 7/17: Large complex and lobulated subacute right subdural hematoma with areas of recent hemorrhage as well, with diffuse mass effect on the right cerebral hemisphere and leftward midline shift of 3 mm.  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 No further DVT/PE or family h/o of clots.  P:  Continuous Pulse Oximetry May require temporary IVC filter  May not require long-term anticoagulation  CARDIOVASCULAR A:  Hypertension - Mild.  P:  Vitals per unit protocol Continuous telemetry monitoring Holding on initiating medication at this time  RENAL A:  Hypokalemia - On 7/18 labs.  P:  Trending UOP Monitoring electrolytes daily Checking BMP & Magnesium now Replacing electrolytes as indicated  GASTROINTESTINAL A:  No acute issues.  P:  NPO pending OR Will need SLP after surgery Pepcid IV daily  HEMATOLOGIC/ONCOLOGIC A:  Chronic Anticoagulation - For H/O DVT/PE w/ Coumadin. S/P Vitamin K 10mg  IV 7/17. Prostate Cancer  P:  Trending Cell Counts & INR daily SCDs Zytiga PO daily Plan for hematology assessment of need to resume systemic anticoagulation  INFECTIOUS A: No evidence of infection.  P: Monitor for fever & leukocytosis.  ENDOCRINE A: No acute issue.  P: Monitor glucose on daily labs.  FAMILY  - Updates: Pt updated at bedside. - Inter-disciplinary family meet or Palliative Care meeting due by: 7/25  TODAY'S SUMMARY:  70 year old male with prostate Ca on daily Zytiga admitted 7/17 for with SDH to Dr. Saintclair Halsted. Plan for burr hole today. Patient has a remote history of DVT/PE from his left lower extremity. This occurred while he was traveling long distances in an automobile. May not require further anticoagulation but would defer to hematology. Holding on initiating treatment for patient's mild hypertension.  Sonia Baller Ashok Cordia, M.D. [facility] Pulmonary & Critical Care Pager:  [phone] After 3pm or if no response, call [phone] 09/14/2015, 10:26 AM

## 2015-09-14 NOTE — Progress Notes (Signed)
Pharmacy Antibiotic Note  [name] is a 70 y.o. male admitted on 09/12/2015 now s/p craniotomy.  Pharmacy has been consulted for surgical px vancomycin dosing.  Plan: Vancomycin 1000mg  IV Q8H x2 doses for 24 hours of coverage.  Will f/u for signs of infection.  Height: 6\' 3"  (190.5 cm) Weight: 212 lb 11.2 oz (96.48 kg) IBW/kg (Calculated) : 84.5  Temp (24hrs), Avg:98.2 F (36.8 C), Min:97.7 F (36.5 C), Max:98.7 F (37.1 C)   Recent Labs Lab 09/12/15 2018 09/13/15 1117 09/14/15 1056  WBC 6.5 6.1  --   CREATININE 0.73 0.72 0.67    Estimated Creatinine Clearance: 105.6 mL/min (by C-G formula based on Cr of 0.67).    Allergies  Allergen Reactions  . Penicillins Rash and Other (See Comments)    Has patient had a PCN reaction causing immediate rash, facial/tongue/throat swelling, SOB or lightheadedness with hypotension: YES Has patient had a PCN reaction causing severe rash involving mucus membranes or skin necrosis: YES Has patient had a PCN reaction that required hospitalization: NO Has patient had a PCN reaction occurring within the last 10 years: NO      Thank you for allowing pharmacy to be a part of this patient's care.  Wynona Neat, PharmD, BCPS  09/14/2015 11:06 PM

## 2015-09-14 NOTE — Care Management Important Message (Signed)
Important Message  Patient Details  Name: [name] MRN: [id number] Date of Birth: 09-07-1945   Medicare Important Message Given:  Yes    Nathen May 09/14/2015, 10:27 AM

## 2015-09-14 NOTE — Anesthesia Postprocedure Evaluation (Signed)
Anesthesia Post Note  Patient: [name]  Procedure(s) Performed: Procedure(s) (LRB): Right craniotomy for subdural  (Right)  Patient location during evaluation: PACU Anesthesia Type: General Level of consciousness: awake and alert Pain management: pain level controlled Vital Signs Assessment: post-procedure vital signs reviewed and stable Respiratory status: spontaneous breathing, nonlabored ventilation, respiratory function stable and patient connected to nasal cannula oxygen Cardiovascular status: blood pressure returned to baseline and stable Postop Assessment: no signs of nausea or vomiting Anesthetic complications: no    Last Vitals:  Filed Vitals:   09/14/15 2145 09/14/15 2200  BP:  144/87  Pulse: 59 51  Temp: 36.7 C   Resp: 14     Last Pain:  Filed Vitals:   09/14/15 2231  PainSc: 0-No pain                 Neeka Urista DAVID

## 2015-09-14 NOTE — Transfer of Care (Signed)
Immediate Anesthesia Transfer of Care Note  Patient: [name]  Procedure(s) Performed: Procedure(s): Right craniotomy for subdural  (Right)  Patient Location: PACU  Anesthesia Type:General  Level of Consciousness: awake, alert , oriented and patient cooperative  Airway & Oxygen Therapy: Patient Spontanous Breathing and Patient connected to face mask oxygen  Post-op Assessment: Report given to RN, Post -op Vital signs reviewed and stable, Patient moving all extremities X 4 and Patient able to stick tongue midline  Post vital signs: Reviewed and stable  Last Vitals:  Filed Vitals:   09/14/15 1800 09/14/15 1900  BP: 165/92 158/110  Pulse: 57 52  Temp:    Resp: 14 15    Last Pain:  Filed Vitals:   09/14/15 1924  PainSc: 0-No pain         Complications: No apparent anesthesia complications

## 2015-09-15 ENCOUNTER — Encounter (HOSPITAL_COMMUNITY): Payer: Self-pay | Admitting: Neurosurgery

## 2015-09-15 ENCOUNTER — Inpatient Hospital Stay (HOSPITAL_COMMUNITY): Payer: Medicare Other

## 2015-09-15 DIAGNOSIS — G8918 Other acute postprocedural pain: Secondary | ICD-10-CM

## 2015-09-15 LAB — RENAL FUNCTION PANEL
ANION GAP: 4 — AB (ref 5–15)
Albumin: 3 g/dL — ABNORMAL LOW (ref 3.5–5.0)
BUN: 6 mg/dL (ref 6–20)
CALCIUM: 8.1 mg/dL — AB (ref 8.9–10.3)
CO2: 28 mmol/L (ref 22–32)
Chloride: 106 mmol/L (ref 101–111)
Creatinine, Ser: 0.64 mg/dL (ref 0.61–1.24)
GFR calc Af Amer: >60 mL/min (ref >60)
GFR calc non Af Amer: >60 mL/min (ref >60)
GLUCOSE: 125 mg/dL — AB (ref 65–99)
Phosphorus: 3.5 mg/dL (ref 2.5–4.6)
Potassium: 3.1 mmol/L — ABNORMAL LOW (ref 3.5–5.1)
SODIUM: 138 mmol/L (ref 135–145)

## 2015-09-15 LAB — CBC WITH DIFFERENTIAL/PLATELET
Basophils Absolute: 0 10*3/uL (ref 0.0–0.1)
Basophils Relative: 0 %
EOS ABS: 0.3 10*3/uL (ref 0.0–0.7)
Eosinophils Relative: 4 %
HEMATOCRIT: 34.9 % — AB (ref 39.0–52.0)
HEMOGLOBIN: 11.5 g/dL — AB (ref 13.0–17.0)
LYMPHS ABS: 1.1 10*3/uL (ref 0.7–4.0)
Lymphocytes Relative: 15 %
MCH: 29.6 pg (ref 26.0–34.0)
MCHC: 33 g/dL (ref 30.0–36.0)
MCV: 89.7 fL (ref 78.0–100.0)
MONOS PCT: 4 %
Monocytes Absolute: 0.3 10*3/uL (ref 0.1–1.0)
NEUTROS PCT: 77 %
Neutro Abs: 5.5 10*3/uL (ref 1.7–7.7)
Platelets: 160 10*3/uL (ref 150–400)
RBC: 3.89 MIL/uL — ABNORMAL LOW (ref 4.22–5.81)
RDW: 13.6 % (ref 11.5–15.5)
WBC: 7.3 10*3/uL (ref 4.0–10.5)

## 2015-09-15 LAB — MAGNESIUM: Magnesium: 1.8 mg/dL (ref 1.7–2.4)

## 2015-09-15 LAB — PROTIME-INR
INR: 1.13 (ref 0.00–1.49)
PROTHROMBIN TIME: 14.7 s (ref 11.6–15.2)

## 2015-09-15 MED ORDER — DEXAMETHASONE SODIUM PHOSPHATE 4 MG/ML IJ SOLN
4.0000 mg | Freq: Four times a day (QID) | INTRAMUSCULAR | Status: AC
  Administered 2015-09-15 (×4): 4 mg via INTRAVENOUS
  Filled 2015-09-15 (×4): qty 1

## 2015-09-15 MED ORDER — POTASSIUM CHLORIDE 10 MEQ/100ML IV SOLN
10.0000 meq | INTRAVENOUS | Status: AC
  Administered 2015-09-15 (×4): 10 meq via INTRAVENOUS
  Filled 2015-09-15 (×4): qty 100

## 2015-09-15 MED ORDER — TRAMADOL HCL 50 MG PO TABS: 50.0000 mg | ORAL_TABLET | Freq: Four times a day (QID) | ORAL | Status: DC | PRN

## 2015-09-15 NOTE — Progress Notes (Signed)
Subjective: Patient reports No complaints known to minimal headache no episodes his left arm  Objective: Vital signs in last 24 hours: Temp:  [97.7 F (36.5 C)-98.5 F (36.9 C)] 98.3 F (36.8 C) (07/20 0400) Pulse Rate:  [48-69] 52 (07/20 0600) Resp:  [10-25] 14 (07/20 0600) BP: (121-165)/(63-110) 121/63 mmHg (07/20 0600) SpO2:  [95 %-100 %] 97 % (07/20 0600) Arterial Line BP: (151-157)/(78-80) 154/78 mmHg (07/19 2200)  Intake/Output from previous day: 07/19 0701 - 07/20 0700 In: 3985 [I.V.:3225; IV Piggyback:760] Out: 4023 [Urine:3970; Drains:28; Blood:25] Intake/Output this shift:    Awake alert neurologically nonfocal  Lab Results:  Recent Labs  09/13/15 1117 09/15/15 0232  WBC 6.1 7.3  HGB 11.6* 11.5*  HCT 34.4* 34.9*  PLT 172 160   BMET  Recent Labs  09/14/15 1056 09/15/15 0232  NA 142 138  K 2.9* 3.1*  CL 111 106  CO2 26 28  GLUCOSE 98 125*  BUN 7 6  CREATININE 0.67 0.64  CALCIUM 8.8* 8.1*    Studies/Results: Ct Head Wo Contrast  09/15/2015  CLINICAL DATA:  70 year old male post drainage subdural hematoma. Subsequent encounter. EXAM: CT HEAD WITHOUT CONTRAST TECHNIQUE: Contiguous axial images were obtained from the base of the skull through the vertex without intravenous contrast. COMPARISON:  09/12/2015 head CT. FINDINGS: Post right craniotomy for drainage of right-sided subdural hematoma with drain placed. Decrease size of septated large right subdural hematoma. Residual right parietal hyperdense subdural hematoma with maximal thickness of 1.7 cm versus prior 2.7 cm. Remainder of large right subdural collection relatively isodense to cerebral spinal fluid (previously hyper dense) with maximal thickness of 1.6 cm whereas previously hyperdense subdural hematoma measured up to 2.5 cm. Surgical drain is along the periphery of this collection. Decrease in mass effect upon the right lateral ventricle which is displaced inferiorly and to the left. Currently 2.7  mm midline shift to the left versus prior 3.2 mm. No CT evidence of large acute thrombotic infarct. No intracranial mass lesion noted on this unenhanced exam. IMPRESSION: Post surgery for drainage of right-sided subdural hematoma with residual collection and mass effect as detailed above. Surgical drain at the periphery of the residual right-sided subdural collection. Electronically Signed   By: Genia Del M.D.   On: 09/15/2015 07:42    Assessment/Plan: CT scan of his head looks good still incomplete reexpansion of his brain and some hygromatous type CSF fluid collection but drains in place and in good position. Continue observation in the ICU continue JP drainage patient may get out of bed will start Decadron  LOS: 3 days     Alyssia Heese P 09/15/2015, 7:49 AM

## 2015-09-15 NOTE — Clinical Documentation Improvement (Signed)
[facility]  Please document query responses in the progress notes and discharge summary, not on the CDI BPA form in [facility]. Thank you!  Decadron 4mg  IV every 6 hours x 4 doses ordered on 09/15/15.  Please document the condition and/or indication for the IV Decadron.   Please exercise your independent, professional judgment when responding. A specific answer is not anticipated or expected.   Thank You, Erling Conte  RN BSN CCDS [phone] Health Information Management [place]

## 2015-09-15 NOTE — Progress Notes (Signed)
PULMONARY / CRITICAL CARE MEDICINE   Name: [name] MRN: [id number] DOB: [birth year]    ADMISSION DATE:  09/12/2015 CONSULTATION DATE:  09/12/2015   REFERRING MD:  Dr. Saintclair Halsted  CHIEF COMPLAINT:  Medical Mgt  HISTORY OF PRESENT ILLNESS:   70 year old male with PMH as below, which is significant for prostate Ca s/p prostatectomy,chemo,radiotion. He remains on Zytiga daily since 05/13/2014. who presented to [facility] ED 7/17 with complaints of headache. He reports hitting his head twice about 2-3 weeks PTA, which he "vaguely recalls" doing. He has had R sided headaches since that time and more recently noticed left arm numbness of short duration which resolved spontaneously. Upon arrival to the ED he reportedly felt totally normal. He underwent CT scan of the head indicating right sided SDH with acute and subacute areas as well as midline shift 81mm. Neurosurgery believes he would benefit from craniotomy or burr hole, however he was coagulopathic so they planned to correct this first. PCCM was consulted to assist with medical management.   SUBJECTIVE: Patient underwent burr hole craniotomy yesterday. Patient did have some nausea overnight with Dilaudid. Reports mild headache. Denies any focal weakness, numbness, or tingling. Denies any abdominal pain.  REVIEW OF SYSTEMS:  No chest pain or pressure. No dyspnea or cough. No subjective fever, chills, or sweats.  VITAL SIGNS: BP 133/79 mmHg  Pulse 52  Temp(Src) 98.3 F (36.8 C) (Oral)  Resp 17  Ht 6\' 3"  (1.905 m)  Wt 212 lb 11.2 oz (96.48 kg)  BMI 26.59 kg/m2  SpO2 99%  HEMODYNAMICS:    VENTILATOR SETTINGS:    INTAKE / OUTPUT: I/O last 3 completed shifts: In: [id number] [P.O.:720; I.V.:3900; IV Piggyback:865] Out: [id number]; Drains:28; Blood:25]  PHYSICAL EXAMINATION: General:  Awake. Alert. No distress. Wife at bedside. Integument:  Warm & dry. No rash on exposed skin. Craniotomy incision clean and dry. Drain in place. HEENT:   Moist mucus membranes. No oral ulcers. No scleral injection.  Cardiovascular:  Regular rate. No edema. No appreciable JVD.  Pulmonary:  Clear bilaterally to auscultation. Normal work of breathing on room air. Speaking in complete sentences. Abdomen: Soft. Normal bowel sounds. Nontender. Musculoskeletal:  Normal bulk and tone. Hand grip strength 5/5 bilaterally. No joint deformity or effusion appreciated. Neurological: Oriented 4. No meningismus. Grossly nonfocal.  LABS:  BMET  Recent Labs Lab 09/13/15 1117 09/14/15 1056 09/15/15 0232  NA 140 142 138  K 3.0* 2.9* 3.1*  CL 108 111 106  CO2 28 26 28   BUN 8 7 6   CREATININE 0.72 0.67 0.64  GLUCOSE 102* 98 125*    Electrolytes  Recent Labs Lab 09/13/15 1117 09/14/15 1056 09/15/15 0232  CALCIUM 8.4* 8.8* 8.1*  MG  --  1.9 1.8  PHOS  --   --  3.5    CBC  Recent Labs Lab 09/12/15 2018 09/13/15 1117 09/15/15 0232  WBC 6.5 6.1 7.3  HGB 12.5* 11.6* 11.5*  HCT 37.1* 34.4* 34.9*  PLT 185 172 160    Coag's  Recent Labs Lab 09/13/15 1117 09/14/15 0424 09/15/15 0232  APTT 30  --   --   INR 1.28 1.11 1.13    Sepsis Markers No results for input(s): LATICACIDVEN, PROCALCITON, O2SATVEN in the last 168 hours.  ABG No results for input(s): PHART, PCO2ART, PO2ART in the last 168 hours.  Liver Enzymes  Recent Labs Lab 09/13/15 1117 09/15/15 0232  AST 19  --   ALT 17  --   ALKPHOS 54  --  BILITOT 1.2  --   ALBUMIN 3.2* 3.0*    Cardiac Enzymes No results for input(s): TROPONINI, PROBNP in the last 168 hours.  Glucose  Recent Labs Lab 09/13/15 0148  GLUCAP 113*    Imaging Ct Head Wo Contrast  09/15/2015  CLINICAL DATA:  70 year old male post drainage subdural hematoma. Subsequent encounter. EXAM: CT HEAD WITHOUT CONTRAST TECHNIQUE: Contiguous axial images were obtained from the base of the skull through the vertex without intravenous contrast. COMPARISON:  09/12/2015 head CT. FINDINGS: Post right  craniotomy for drainage of right-sided subdural hematoma with drain placed. Decrease size of septated large right subdural hematoma. Residual right parietal hyperdense subdural hematoma with maximal thickness of 1.7 cm versus prior 2.7 cm. Remainder of large right subdural collection relatively isodense to cerebral spinal fluid (previously hyper dense) with maximal thickness of 1.6 cm whereas previously hyperdense subdural hematoma measured up to 2.5 cm. Surgical drain is along the periphery of this collection. Decrease in mass effect upon the right lateral ventricle which is displaced inferiorly and to the left. Currently 2.7 mm midline shift to the left versus prior 3.2 mm. No CT evidence of large acute thrombotic infarct. No intracranial mass lesion noted on this unenhanced exam. IMPRESSION: Post surgery for drainage of right-sided subdural hematoma with residual collection and mass effect as detailed above. Surgical drain at the periphery of the residual right-sided subdural collection. Electronically Signed   By: Genia Del M.D.   On: 09/15/2015 07:42     STUDIES:  CT Head 7/17: Large complex and lobulated subacute right subdural hematoma with areas of recent hemorrhage as well, with diffuse mass effect on the right cerebral hemisphere and leftward midline shift of 3 mm. CT Head 7/20: Postsurgical drainage right subdural hematoma with residual collection and mass effect. Surgical drain at periphery of residual right-sided subdural collection.  MICROBIOLOGY: MRSA PCR 7/18:  Negative   ANTIBIOTICS: None  SIGNIFICANT EVENTS: 7/17 - Admit 7/19 - Craniotomy/Burr Hold by Saintclair Halsted  LINES/TUBES: Skull JP Drain 7/19>>> PIV x2  ASSESSMENT / PLAN:  NEUROLOGIC A:  Subdural Hematoma - S/P Craniotomy w/ drain placement 7/19. 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 No further DVT/PE or family h/o of clots.  P:  Continuous Pulse Oximetry May require temporary IVC filter  May not require long-term anticoagulation  CARDIOVASCULAR A:  Hypertension - Mild.  P:  Vitals per unit protocol Continuous telemetry monitoring Holding on initiating medication at this time  RENAL A:  Hypokalemia - Replacing.  P:  Trending UOP Monitoring electrolytes daily Checking BMP & Magnesium now Replacing electrolytes as indicated KCl 48mEq IV x4 runs  GASTROINTESTINAL A:  No acute issues.  P:  Reg Diet D/C Pepcid  HEMATOLOGIC/ONCOLOGIC A:  Chronic Anticoagulation - For H/O DVT/PE w/ Coumadin. S/P Vitamin K 10mg  IV 7/17. Prostate Cancer  P:  Trending Cell Counts & INR daily SCDs Zytiga PO daily Plan for hematology assessment of need to resume systemic anticoagulation  INFECTIOUS A: No evidence of infection.  P: Monitor for fever & leukocytosis. Peri-op Prophylactic Vancomycin  ENDOCRINE A: No acute issue.  P: Monitor glucose on daily labs.  FAMILY  - Updates: Pt updated at bedside. - Inter-disciplinary family meet or Palliative Care meeting due by: 7/25  TODAY'S SUMMARY:  70 year  old male with prostate Ca on daily Zytiga admitted 7/17 for with SDH to Dr. Saintclair Halsted. Patient status post craniotomy and bur hole for subdural hematoma. Seems to have tolerated procedure well. No focal neurological findings as far as I can tell. Starting Ultram as a milder form of postoperative pain control to minimize nausea. Continuing to hold on systemic anticoagulation. Continuing SCDs and early ambulation. Perioperative vancomycin for antibiotic prophylaxis. Further postoperative care per neurosurgery.  Sonia Baller Ashok Cordia, M.D. [facility] Pulmonary & Critical Care Pager:  [phone] After 3pm or if no response, call [phone] 09/15/2015, 12:07  PM

## 2015-09-15 NOTE — Progress Notes (Signed)
Pt up to bathroom with minimal assistance 

## 2015-09-15 NOTE — Progress Notes (Signed)
   09/15/15 0935  Clinical Encounter Type  Visited With Patient  Visit Type Follow-up  Spiritual Encounters  Spiritual Needs Emotional;Prayer  Chaplain made a follow up visit with patient on morning rounds.  Patient alert.  Chaplain inquired as to the surgery on Wednesday.  Patient indicated he felt a little out of it.  Chaplain made further support available if needed.

## 2015-09-15 NOTE — Progress Notes (Signed)
Pt up to bathroom and ambulated around the unit with no issues.

## 2015-09-16 LAB — RENAL FUNCTION PANEL
ALBUMIN: 3.1 g/dL — AB (ref 3.5–5.0)
ANION GAP: 6 (ref 5–15)
BUN: 9 mg/dL (ref 6–20)
CO2: 26 mmol/L (ref 22–32)
Calcium: 9.1 mg/dL (ref 8.9–10.3)
Chloride: 107 mmol/L (ref 101–111)
Creatinine, Ser: 0.83 mg/dL (ref 0.61–1.24)
GFR calc Af Amer: >60 mL/min (ref >60)
Glucose, Bld: 152 mg/dL — ABNORMAL HIGH (ref 65–99)
PHOSPHORUS: 2.4 mg/dL — AB (ref 2.5–4.6)
POTASSIUM: 3.7 mmol/L (ref 3.5–5.1)
Sodium: 139 mmol/L (ref 135–145)

## 2015-09-16 LAB — CBC WITH DIFFERENTIAL/PLATELET
BASOS ABS: 0 10*3/uL (ref 0.0–0.1)
BASOS PCT: 0 %
Eosinophils Absolute: 0 10*3/uL (ref 0.0–0.7)
Eosinophils Relative: 0 %
HEMATOCRIT: 37.6 % — AB (ref 39.0–52.0)
Hemoglobin: 12.5 g/dL — ABNORMAL LOW (ref 13.0–17.0)
Lymphocytes Relative: 12 %
Lymphs Abs: 1.1 10*3/uL (ref 0.7–4.0)
MCH: 29.2 pg (ref 26.0–34.0)
MCHC: 33.2 g/dL (ref 30.0–36.0)
MCV: 87.9 fL (ref 78.0–100.0)
MONO ABS: 0.2 10*3/uL (ref 0.1–1.0)
Monocytes Relative: 2 %
NEUTROS ABS: 8.1 10*3/uL — AB (ref 1.7–7.7)
Neutrophils Relative %: 86 %
PLATELETS: 187 10*3/uL (ref 150–400)
RBC: 4.28 MIL/uL (ref 4.22–5.81)
RDW: 13.4 % (ref 11.5–15.5)
WBC: 9.4 10*3/uL (ref 4.0–10.5)

## 2015-09-16 LAB — MAGNESIUM: MAGNESIUM: 1.8 mg/dL (ref 1.7–2.4)

## 2015-09-16 LAB — PROTIME-INR
INR: 1.13 (ref 0.00–1.49)
PROTHROMBIN TIME: 14.7 s (ref 11.6–15.2)

## 2015-09-16 MED ORDER — SODIUM PHOSPHATES 45 MMOLE/15ML IV SOLN
10.0000 mmol | Freq: Once | INTRAVENOUS | Status: AC
  Administered 2015-09-16: 10 mmol via INTRAVENOUS
  Filled 2015-09-16: qty 3.33

## 2015-09-16 MED ORDER — LEVETIRACETAM 500 MG PO TABS
500.0000 mg | ORAL_TABLET | Freq: Two times a day (BID) | ORAL | Status: DC
  Administered 2015-09-16 – 2015-09-17 (×3): 500 mg via ORAL
  Filled 2015-09-16 (×3): qty 1

## 2015-09-16 NOTE — Progress Notes (Deleted)
Subjective: Patient reports Doing well minimal to no pain significantly improved leg pain is confused and had some delirium overnight  Objective: Vital signs in last 24 hours: Temp:  [98 F (36.7 C)-98.5 F (36.9 C)] 98.4 F (36.9 C) (07/21 0400) Pulse Rate:  [25-77] 57 (07/21 0600) Resp:  [13-25] 18 (07/21 0600) BP: (103-167)/(60-101) 122/78 mmHg (07/21 0600) SpO2:  [66 %-100 %] 100 % (07/21 0600)  Intake/Output from previous day: 07/20 0701 - 07/21 0700 In: 2645 [P.O.:60; I.V.:1725; IV Piggyback:860] Out: 1400 [Urine:1035; Emesis/NG output:300; Drains:65] Intake/Output this shift:    Awake alert oriented strength 5 out of 5  Lab Results:  Recent Labs  09/15/15 0232 09/16/15 0321  WBC 7.3 9.4  HGB 11.5* 12.5*  HCT 34.9* 37.6*  PLT 160 187   BMET  Recent Labs  09/15/15 0232 09/16/15 0321  NA 138 139  K 3.1* 3.7  CL 106 107  CO2 28 26  GLUCOSE 125* 152*  BUN 6 9  CREATININE 0.64 0.83  CALCIUM 8.1* 9.1    Studies/Results: Ct Head Wo Contrast  09/15/2015  CLINICAL DATA:  70 year old male post drainage subdural hematoma. Subsequent encounter. EXAM: CT HEAD WITHOUT CONTRAST TECHNIQUE: Contiguous axial images were obtained from the base of the skull through the vertex without intravenous contrast. COMPARISON:  09/12/2015 head CT. FINDINGS: Post right craniotomy for drainage of right-sided subdural hematoma with drain placed. Decrease size of septated large right subdural hematoma. Residual right parietal hyperdense subdural hematoma with maximal thickness of 1.7 cm versus prior 2.7 cm. Remainder of large right subdural collection relatively isodense to cerebral spinal fluid (previously hyper dense) with maximal thickness of 1.6 cm whereas previously hyperdense subdural hematoma measured up to 2.5 cm. Surgical drain is along the periphery of this collection. Decrease in mass effect upon the right lateral ventricle which is displaced inferiorly and to the left. Currently  2.7 mm midline shift to the left versus prior 3.2 mm. No CT evidence of large acute thrombotic infarct. No intracranial mass lesion noted on this unenhanced exam. IMPRESSION: Post surgery for drainage of right-sided subdural hematoma with residual collection and mass effect as detailed above. Surgical drain at the periphery of the residual right-sided subdural collection. Electronically Signed   By: Genia Del M.D.   On: 09/15/2015 07:42    Assessment/Plan: Into the bedrest today to the OR for implantation of cages screws rods to finish the aborted procedure.  LOS: 4 days     Gerard Bonus P 09/16/2015, 7:34 AM

## 2015-09-16 NOTE — Progress Notes (Signed)
PULMONARY / CRITICAL CARE MEDICINE   Name: [name] MRN: [id number] DOB: [birth year]    ADMISSION DATE:  09/12/2015 CONSULTATION DATE:  09/12/2015   REFERRING MD:  Dr. Saintclair Halsted  CHIEF COMPLAINT:  Medical Mgt  HISTORY OF PRESENT ILLNESS:   70 year old male with PMH as below, which is significant for prostate Ca s/p prostatectomy,chemo,radiotion. He remains on Zytiga daily since 05/13/2014. who presented to [facility] ED 7/17 with complaints of headache. He reports hitting his head twice about 2-3 weeks PTA, which he "vaguely recalls" doing. He has had R sided headaches since that time and more recently noticed left arm numbness of short duration which resolved spontaneously. Upon arrival to the ED he reportedly felt totally normal. He underwent CT scan of the head indicating right sided SDH with acute and subacute areas as well as midline shift 13mm. Neurosurgery believes he would benefit from craniotomy or burr hole, however he was coagulopathic so they planned to correct this first. PCCM was consulted to assist with medical management.   SUBJECTIVE: Up in chair  No distress.  No focal def   VITAL SIGNS: BP 148/88 mmHg  Pulse 61  Temp(Src) 98.4 F (36.9 C) (Oral)  Resp 15  Ht 6\' 3"  (1.905 m)  Wt 212 lb 11.2 oz (96.48 kg)  BMI 26.59 kg/m2  SpO2 92% Room air  HEMODYNAMICS:    VENTILATOR SETTINGS:    INTAKE / OUTPUT: I/O last 3 completed shifts: In: 6025 [P.O.:60; I.V.:4500; IV Piggyback:1465] Out: [id number] [Urine:3060; Emesis/NG output:300; Drains:93; Blood:25]  PHYSICAL EXAMINATION: General:  Awake. Alert. No distress. Sitting up in chair Integument:  Warm & dry. No rash on exposed skin. Craniotomy incision clean and dry. Drain in place. HEENT:  Moist mucus membranes. No oral ulcers. No scleral injection.  Cardiovascular:  Regular rate. No edema. No appreciable JVD.  Pulmonary:  Clear bilaterally to auscultation. Normal work of breathing on room air.  Abdomen: Soft. Normal bowel  sounds. Nontender. Musculoskeletal:  Normal bulk and tone. Hand grip strength 5/5 bilaterally. No joint deformity or effusion appreciated. Neurological: Oriented 4. No meningismus. Grossly nonfocal.  LABS:  BMET  Recent Labs Lab 09/14/15 1056 09/15/15 0232 09/16/15 0321  NA 142 138 139  K 2.9* 3.1* 3.7  CL 111 106 107  CO2 26 28 26   BUN 7 6 9   CREATININE 0.67 0.64 0.83  GLUCOSE 98 125* 152*    Electrolytes  Recent Labs Lab 09/14/15 1056 09/15/15 0232 09/16/15 0321  CALCIUM 8.8* 8.1* 9.1  MG 1.9 1.8 1.8  PHOS  --  3.5 2.4*    CBC  Recent Labs Lab 09/13/15 1117 09/15/15 0232 09/16/15 0321  WBC 6.1 7.3 9.4  HGB 11.6* 11.5* 12.5*  HCT 34.4* 34.9* 37.6*  PLT 172 160 187    Coag's  Recent Labs Lab 09/13/15 1117 09/14/15 0424 09/15/15 0232 09/16/15 0321  APTT 30  --   --   --   INR 1.28 1.11 1.13 1.13    Sepsis Markers No results for input(s): LATICACIDVEN, PROCALCITON, O2SATVEN in the last 168 hours.  ABG No results for input(s): PHART, PCO2ART, PO2ART in the last 168 hours.  Liver Enzymes  Recent Labs Lab 09/13/15 1117 09/15/15 0232 09/16/15 0321  AST 19  --   --   ALT 17  --   --   ALKPHOS 54  --   --   BILITOT 1.2  --   --   ALBUMIN 3.2* 3.0* 3.1*    Cardiac Enzymes  No results for input(s): TROPONINI, PROBNP in the last 168 hours.  Glucose  Recent Labs Lab 09/13/15 0148  GLUCAP 113*    Imaging No results found.   STUDIES:  CT Head 7/17: Large complex and lobulated subacute right subdural hematoma with areas of recent hemorrhage as well, with diffuse mass effect on the right cerebral hemisphere and leftward midline shift of 3 mm. CT Head 7/20: Postsurgical drainage right subdural hematoma with residual collection and mass effect. Surgical drain at periphery of residual right-sided subdural collection.  MICROBIOLOGY: MRSA PCR 7/18:  Negative   ANTIBIOTICS: None  SIGNIFICANT EVENTS: 7/17 - Admit 7/19 -  Craniotomy/Burr Hold by Saintclair Halsted  LINES/TUBES: Skull JP Drain 7/19>>> PIV x2  ASSESSMENT / PLAN:   Subdural Hematoma - S/P Craniotomy w/ drain placement 7/19. 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 No further DVT/PE or family h/o of clots. Plan:  Ck intermittent pulse ox Would f/u out pt for LE dopplers   Hypertension - Mild. Plan:  Vitals per unit protocol Holding on initiating medication at this time  Mild hypophosphatemia  Plan replace  Chronic Anticoagulation - For H/O DVT/PE w/ Coumadin. S/P Vitamin K 10mg  IV 7/17. Prostate Cancer Plan:  Trending Cell Counts & INR daily SCDs Zytiga PO daily Plan for hematology assessment of need to resume systemic anticoagulation   FAMILY  - Updates: Pt updated at bedside. - Inter-disciplinary family meet or Palliative Care meeting due by: 7/25  TODAY'S SUMMARY:  70 year old male with prostate Ca on daily Zytiga admitted 7/17 for with SDH to Dr. Saintclair Halsted. Patient status post craniotomy and bur hole for subdural hematoma. Seems to have tolerated procedure well. No focal neurological findings. He is up and ambulating today. No distress. Continuing to hold on systemic anticoagulation. Continuing SCDs and early ambulation.. Further postoperative care per neurosurgery. We will s/o.   [name] ACNP-BC [place] Pager # [phone] OR # [phone] if no answer  09/16/2015, 10:23 AM  PCCM Attending Note: Patient seen and examined with nurse practitioner. Please refer to his note which I have reviewed in detail. Patient denies any headache or vision changes. He denies any new focal weakness, numbness, or tingling. Denies any syncope or near syncope upon standing. Patient did ambulate around the ICU yesterday.  Review of Systems: No  subjective fever, chills, or sweats. No dyspnea or cough. No rashes or abnormal bruising.  BP 148/88 mmHg  Pulse 61  Temp(Src) 98.4 F (36.9 C) (Oral)  Resp 15  Ht 6\' 3"  (1.905 m)  Wt 212 lb 11.2 oz (96.48 kg)  BMI 26.59 kg/m2  SpO2 92% General:  Awake. Alert. Sitting up in chair.  Integument:  Warm & dry. No rash on exposed skin. No bruising. Craniotomy dressing clean and dry. HEENT:  Moist mucus membranes. No oral ulcers. No scleral injection or icterus. Drain removed from scalp. Cardiovascular:  Regular rate. Sinus rhythm on telemetry. No edema.  Pulmonary:  Good aeration & clear to auscultation bilaterally with good aeration in the bases. Normal work of breathing on room air. Abdomen: Soft. Normal bowel sounds. Nondistended.  Musculoskeletal:  Normal bulk and tone. Hand grip strength 5/5 bilaterally. No joint effusion appreciated.  CBC Latest Ref Rng 09/16/2015 09/15/2015 09/13/2015  WBC 4.0 - 10.5 K/uL 9.4 7.3 6.1  Hemoglobin 13.0 -  17.0 g/dL 12.5(L) 11.5(L) 11.6(L)  Hematocrit 39.0 - 52.0 % 37.6(L) 34.9(L) 34.4(L)  Platelets 150 - 400 K/uL 187 160 172    BMP Latest Ref Rng 09/16/2015 09/15/2015 09/14/2015  Glucose 65 - 99 mg/dL 152(H) 125(H) 98  BUN 6 - 20 mg/dL 9 6 7   Creatinine 0.61 - 1.24 mg/dL 0.83 0.64 0.67  Sodium 135 - 145 mmol/L 139 138 142  Potassium 3.5 - 5.1 mmol/L 3.7 3.1(L) 2.9(L)  Chloride 101 - 111 mmol/L 107 106 111  CO2 22 - 32 mmol/L 26 28 26   Calcium 8.9 - 10.3 mg/dL 9.1 8.1(L) 8.8(L)   A/P:  70 year old male with right subdural hematoma status post craniotomy and bur hole with evacuation. Postprocedure doing well. Patient is ambulating.Tolerating diet. Neurologically seems to be stable. No further signs or symptoms of bleeding.  1. Right subdural hematoma: Postoperative care per neurosurgery. Status post evacuation and bur hole 7/19. 2. History of DVT/PE: Systemic anticoagulation currently on hold. Recommend evaluation by hematology/oncology as to whether or  not patient would need to restart systemic anticoagulation in the setting of prostate cancer. I believe he may be able to discontinue systemic anticoagulation altogether with appropriate monitoring. 3. Prostate cancer: Continuing on Zytiga. Follows with oncology. 4. Essential hypertension: Mild. Recommend outpatient follow-up with primary care physician. 5. Prophylaxis: SCDs.  PCCM will sign off. Please contact us if we can be of any further assistance.   Sonia Baller Ashok Cordia, M.D. [facility] Pager:  [phone] After 3pm or if no response, call [phone] 10:45 AM 09/16/2015

## 2015-09-16 NOTE — Progress Notes (Signed)
Patient ID: [name], male   DOB: 09/18/1945, 70 y.o.   MRN: [id number] Doing well no headache no numbness and tingling in his arms or hands  Awake alert strength out of 5 wound clean dry and intact  DC'd his JP drain continue observation and will transfer to the floor later this morning.

## 2015-09-17 LAB — RENAL FUNCTION PANEL
Albumin: 2.7 g/dL — ABNORMAL LOW (ref 3.5–5.0)
Anion gap: 4 — ABNORMAL LOW (ref 5–15)
BUN: 19 mg/dL (ref 6–20)
CO2: 30 mmol/L (ref 22–32)
Calcium: 8.7 mg/dL — ABNORMAL LOW (ref 8.9–10.3)
Chloride: 108 mmol/L (ref 101–111)
Creatinine, Ser: 0.97 mg/dL (ref 0.61–1.24)
GFR calc Af Amer: >60 mL/min (ref >60)
GFR calc non Af Amer: >60 mL/min (ref >60)
Glucose, Bld: 101 mg/dL — ABNORMAL HIGH (ref 65–99)
Phosphorus: 3.4 mg/dL (ref 2.5–4.6)
Potassium: 3.2 mmol/L — ABNORMAL LOW (ref 3.5–5.1)
Sodium: 142 mmol/L (ref 135–145)

## 2015-09-17 LAB — CBC WITH DIFFERENTIAL/PLATELET
BASOS PCT: 0 %
Basophils Absolute: 0 10*3/uL (ref 0.0–0.1)
Eosinophils Absolute: 0.1 10*3/uL (ref 0.0–0.7)
Eosinophils Relative: 2 %
HEMATOCRIT: 35 % — AB (ref 39.0–52.0)
HEMOGLOBIN: 11.5 g/dL — AB (ref 13.0–17.0)
LYMPHS ABS: 1.8 10*3/uL (ref 0.7–4.0)
Lymphocytes Relative: 22 %
MCH: 29.5 pg (ref 26.0–34.0)
MCHC: 32.9 g/dL (ref 30.0–36.0)
MCV: 89.7 fL (ref 78.0–100.0)
MONOS PCT: 6 %
Monocytes Absolute: 0.5 10*3/uL (ref 0.1–1.0)
NEUTROS ABS: 5.6 10*3/uL (ref 1.7–7.7)
NEUTROS PCT: 70 %
Platelets: 168 10*3/uL (ref 150–400)
RBC: 3.9 MIL/uL — AB (ref 4.22–5.81)
RDW: 13.9 % (ref 11.5–15.5)
WBC: 8 10*3/uL (ref 4.0–10.5)

## 2015-09-17 LAB — PROTIME-INR
INR: 1.06 (ref 0.00–1.49)
Prothrombin Time: 14 seconds (ref 11.6–15.2)

## 2015-09-17 LAB — MAGNESIUM: Magnesium: 1.9 mg/dL (ref 1.7–2.4)

## 2015-09-17 MED ORDER — LEVETIRACETAM 500 MG PO TABS: 500.0000 mg | ORAL_TABLET | Freq: Two times a day (BID) | ORAL | Status: DC

## 2015-09-17 NOTE — Progress Notes (Signed)
Pt d/c to home by car with family. Assessment stable. Prescription given. All questions answered 

## 2015-09-17 NOTE — Discharge Summary (Signed)
Physician Discharge Summary  Patient ID: [name] MRN: [id number] DOB/AGE: 68-Mar-1947 68 y.o.  Admit date: 09/12/2015 Discharge date: 09/17/2015  Admission Diagnoses:  Right frontoparietal subdural hematoma subacute, therapeutic anticoagulation with warfarin  Discharge Diagnoses:  Right frontoparietal subdural hematoma subacute, therapeutic anticoagulation with warfarin (reversed)  Active Problems:   Subdural bleeding (HCC)   SDH (subdural hematoma) ([place])   Discharged Condition: good  Hospital Course: Patient admitted by Dr. Saintclair Halsted with subdural hematoma, anticoagulated with warfarin for PE 10 years ago. Dr. Saintclair Halsted reversed anticoagulation took the patient to surgery 3 days ago for evacuation of subdural hematoma. The subdural drain was pulled yesterday. Patient explains that he is doing well. He denies headache. He is up and ambulating. Incision is healing nicely. There is no erythema, swelling, or drainage. He was started on Keppra 500 mg twice a day, which we will continue following discharge. He explains to me that Dr. Saintclair Halsted planned on discharge today. He has been given instructions regarding wound care and activities following discharge. He is to return for follow-up with Dr. Saintclair Halsted in 10 days, with a CT of the brain without contrast that day at [facility].  Discharge Exam: Blood pressure 132/72, pulse 65, temperature 98 F (36.7 C), temperature source Oral, resp. rate 20, height 6\' 3"  (1.905 m), weight 96.48 kg (212 lb 11.2 oz), SpO2 97 %.  Disposition: Home     Medication List    STOP taking these medications        warfarin 5 MG tablet  Commonly known as:  COUMADIN      TAKE these medications        abiraterone Acetate 250 MG tablet  Commonly known as:  ZYTIGA  Take 4 tablets (1,000 mg total) by mouth daily. Take on an empty stomach 1 hour before or 2 hours after a meal     ZYTIGA 250 MG tablet  Generic drug:  abiraterone Acetate  TAKE 4 TABLETS BY MOUTH  DAILY ON AN EMPTY STOMACH 1 HOUR BEFORE OR 2 HOURS AFTER A MEAL     ALPRAZolam 0.5 MG tablet  Commonly known as:  XANAX  Take 0.5 mg by mouth 3 (three) times daily as needed for sleep.     levETIRAcetam 500 MG tablet  Commonly known as:  KEPPRA  Take 1 tablet (500 mg total) by mouth 2 (two) times daily.     LUPRON DEPOT IM  Inject into the muscle every 4 (four) months.         SignedHosie Spangle 09/17/2015, 8:43 AM

## 2015-09-17 NOTE — Discharge Instructions (Signed)
Subdural Hematoma Evacuation Subdural hematoma evacuation is a procedure used to treat a collection of blood (blood clot) between your brain and its tough outer covering (dura). This is caused by bleeding (hemorrhage) from a torn vein. Subdural hematoma evacuation is sometimes done for bleeding that develops slowly, over weeks or months (chronic subdural hematoma). The procedure may be needed if the bleeding becomes dangerous or presses on the brain. It is also sometimes done as an emergency procedure after a head injury (acute subdural hematoma). During the procedure, the skull is opened, and the blood clot is gently flushed away using a saline (balanced salt) solution. LET [facility] CARE PROVIDER KNOW ABOUT:   Any allergies you have.   All medicines you are taking, including vitamins, herbs, eye drops, creams, and over-the-counter medicines.   Use of steroids (by mouth or creams).   Previous problems you or members of your family have had with the use of anesthetics.   Any blood disorders you have, including a history of blood clots (thrombophlebitis).   Previous surgeries you have had.   Possibility of pregnancy, if this applies.   Medical conditions you have.  RISKS AND COMPLICATIONS  Generally, subdural hematoma evacuation is a safe procedure. However, as with any procedure, complications can occur. Possible complications include:  Infection.   Allergic reaction to the anesthetic or other medicines given.   Bleeding after surgery.   Seizures from brain irritability.   Brain damage.  Brain swelling.  Stroke.  BEFORE THE PROCEDURE   Do not eat or drink anything for 24 hours before the procedure.  Ask your health care provider about changing or stopping your regular medicines. PROCEDURE   You will be given a medicine to make you sleep through the procedure (general anesthetic).   Your scalp will be shaved and cleaned at the site where the skull will be  opened.   Small holes (burr holes) are carefully drilled into your skull. A bone saw is then used to connect the burr holes until a section of your skull can be removed. This is called a bone flap.   After the bone flap is removed, the blood clot is removed from your brain using saline or other solutions.  Your surgeon may place a drain inside your skull to remove blood or fluids that can collect after surgery.   After the procedure is finished, the bone flap is usually replaced. Sometimes there is too much brain swelling to replace the bone, especially if you are being treated for an acute subdural hematoma. In this case, either the bone flap is saved in a special storage area ("bone bank") or a pocket is created on your abdomen, and the bone is placed there. The scalp is then stitched back together.  If the bone flapis not replaced at the time of your evacuation surgery, it will be replaced later, during another surgery. This will happen when your surgeon determines that the brain swelling has decreased enough to make replacement of the bone flap safe. AFTER THE PROCEDURE   You will stay in a recovery area until you are stable. Your progress will be watched closely.   Once you are stable enough to move, you will go to a regular hospital room or an intensive care unit, depending on your condition.   Depending on your progress, your hospital stay may vary from a few days to several weeks.   This information is not intended to replace advice given to you by your health care  provider. Make sure you discuss any questions you have with your health care provider.   Document Released: 12/10/2008 Document Revised: 10/15/2012 Document Reviewed: 08/04/2012 Elsevier Interactive Patient Education [employer].

## 2015-09-21 ENCOUNTER — Other Ambulatory Visit: Payer: Self-pay | Admitting: Neurosurgery

## 2015-09-21 DIAGNOSIS — S065XAA Traumatic subdural hemorrhage with loss of consciousness status unknown, initial encounter: Secondary | ICD-10-CM

## 2015-09-21 DIAGNOSIS — S065X9A Traumatic subdural hemorrhage with loss of consciousness of unspecified duration, initial encounter: Secondary | ICD-10-CM

## 2015-09-26 ENCOUNTER — Ambulatory Visit
Admission: RE | Admit: 2015-09-26 | Discharge: 2015-09-26 | Disposition: A | Discharge status: Home / Self Care | Payer: Medicare Other | Source: Ambulatory Visit | Attending: Neurosurgery | Admitting: Neurosurgery

## 2015-09-26 ENCOUNTER — Other Ambulatory Visit: Payer: Self-pay | Admitting: *Deleted

## 2015-09-26 ENCOUNTER — Encounter: Payer: Self-pay | Admitting: *Deleted

## 2015-09-26 DIAGNOSIS — C61 Malignant neoplasm of prostate: Secondary | ICD-10-CM

## 2015-09-26 DIAGNOSIS — S065X9A Traumatic subdural hemorrhage with loss of consciousness of unspecified duration, initial encounter: Secondary | ICD-10-CM

## 2015-09-26 DIAGNOSIS — S065XAA Traumatic subdural hemorrhage with loss of consciousness status unknown, initial encounter: Secondary | ICD-10-CM

## 2015-09-26 MED ORDER — IOPAMIDOL (ISOVUE-300) INJECTION 61%
75.0000 mL | Freq: Once | INTRAVENOUS | Status: AC | PRN
  Administered 2015-09-26: 75 mL via INTRAVENOUS

## 2015-09-26 MED ORDER — ABIRATERONE ACETATE 250 MG PO TABS: 1000.0000 mg | ORAL_TABLET | Freq: Every day | ORAL | 0 refills | Status: DC

## 2015-09-26 MED FILL — ZYTIGA 250 MG TABLET: 250 | 30 days supply | Qty: 120 | Fill #0

## 2015-10-07 ENCOUNTER — Other Ambulatory Visit: Payer: Self-pay | Admitting: Neurosurgery

## 2015-10-07 DIAGNOSIS — S065X9A Traumatic subdural hemorrhage with loss of consciousness of unspecified duration, initial encounter: Secondary | ICD-10-CM

## 2015-10-07 DIAGNOSIS — S065XAA Traumatic subdural hemorrhage with loss of consciousness status unknown, initial encounter: Secondary | ICD-10-CM

## 2015-10-18 ENCOUNTER — Ambulatory Visit
Admission: RE | Admit: 2015-10-18 | Discharge: 2015-10-18 | Disposition: A | Discharge status: Home / Self Care | Payer: Medicare Other | Source: Ambulatory Visit | Attending: Neurosurgery | Admitting: Neurosurgery

## 2015-10-18 DIAGNOSIS — S065X9A Traumatic subdural hemorrhage with loss of consciousness of unspecified duration, initial encounter: Secondary | ICD-10-CM

## 2015-10-18 DIAGNOSIS — S065XAA Traumatic subdural hemorrhage with loss of consciousness status unknown, initial encounter: Secondary | ICD-10-CM

## 2015-10-24 ENCOUNTER — Other Ambulatory Visit: Payer: Self-pay | Admitting: *Deleted

## 2015-10-24 DIAGNOSIS — C61 Malignant neoplasm of prostate: Secondary | ICD-10-CM

## 2015-10-24 MED ORDER — ABIRATERONE ACETATE 250 MG PO TABS: 1000.0000 mg | ORAL_TABLET | Freq: Every day | ORAL | 0 refills | Status: DC

## 2015-10-24 MED FILL — ZYTIGA 250 MG TABLET: 250 | 30 days supply | Qty: 120 | Fill #0

## 2015-11-08 ENCOUNTER — Other Ambulatory Visit: Payer: Self-pay | Admitting: Neurosurgery

## 2015-11-08 DIAGNOSIS — S065X9A Traumatic subdural hemorrhage with loss of consciousness of unspecified duration, initial encounter: Secondary | ICD-10-CM

## 2015-11-08 DIAGNOSIS — S065XAA Traumatic subdural hemorrhage with loss of consciousness status unknown, initial encounter: Secondary | ICD-10-CM

## 2015-11-14 ENCOUNTER — Ambulatory Visit
Admission: RE | Admit: 2015-11-14 | Discharge: 2015-11-14 | Disposition: A | Discharge status: Home / Self Care | Payer: Medicare Other | Source: Ambulatory Visit | Attending: Neurosurgery | Admitting: Neurosurgery

## 2015-11-14 DIAGNOSIS — S065XAA Traumatic subdural hemorrhage with loss of consciousness status unknown, initial encounter: Secondary | ICD-10-CM

## 2015-11-14 DIAGNOSIS — S065X9A Traumatic subdural hemorrhage with loss of consciousness of unspecified duration, initial encounter: Secondary | ICD-10-CM

## 2015-11-22 ENCOUNTER — Other Ambulatory Visit: Payer: Self-pay | Admitting: *Deleted

## 2015-11-22 MED ORDER — ABIRATERONE ACETATE 250 MG PO TABS: 1000.0000 mg | ORAL_TABLET | Freq: Every day | ORAL | 0 refills | Status: DC

## 2015-11-22 MED FILL — ZYTIGA 250 MG TABLET: 250 | 30 days supply | Qty: 120 | Fill #0

## 2015-11-23 ENCOUNTER — Other Ambulatory Visit (HOSPITAL_BASED_OUTPATIENT_CLINIC_OR_DEPARTMENT_OTHER): Payer: Medicare Other

## 2015-11-23 DIAGNOSIS — C61 Malignant neoplasm of prostate: Secondary | ICD-10-CM

## 2015-11-23 LAB — CBC WITH DIFFERENTIAL/PLATELET
BASO%: 0.5 % (ref 0.0–2.0)
BASOS ABS: 0 10*3/uL (ref 0.0–0.1)
EOS ABS: 0.3 10*3/uL (ref 0.0–0.5)
EOS%: 4.9 % (ref 0.0–7.0)
HEMATOCRIT: 38.3 % — AB (ref 38.4–49.9)
HEMOGLOBIN: 12.8 g/dL — AB (ref 13.0–17.1)
LYMPH#: 2 10*3/uL (ref 0.9–3.3)
LYMPH%: 28.3 % (ref 14.0–49.0)
MCH: 29.8 pg (ref 27.2–33.4)
MCHC: 33.3 g/dL (ref 32.0–36.0)
MCV: 89.5 fL (ref 79.3–98.0)
MONO#: 0.4 10*3/uL (ref 0.1–0.9)
MONO%: 6.2 % (ref 0.0–14.0)
NEUT%: 60.1 % (ref 39.0–75.0)
NEUTROS ABS: 4.1 10*3/uL (ref 1.5–6.5)
Platelets: 192 10*3/uL (ref 140–400)
RBC: 4.28 10*6/uL (ref 4.20–5.82)
RDW: 13.7 % (ref 11.0–14.6)
WBC: 6.9 10*3/uL (ref 4.0–10.3)

## 2015-11-23 LAB — COMPREHENSIVE METABOLIC PANEL
ALBUMIN: 3.2 g/dL — AB (ref 3.5–5.0)
ALK PHOS: 72 U/L (ref 40–150)
ALT: 17 U/L (ref 0–55)
AST: 18 U/L (ref 5–34)
Anion Gap: 10 mEq/L (ref 3–11)
BILIRUBIN TOTAL: 0.61 mg/dL (ref 0.20–1.20)
BUN: 14.2 mg/dL (ref 7.0–26.0)
CALCIUM: 9 mg/dL (ref 8.4–10.4)
CO2: 25 mEq/L (ref 22–29)
Chloride: 108 mEq/L (ref 98–109)
Creatinine: 0.9 mg/dL (ref 0.7–1.3)
EGFR: 88 mL/min/{1.73_m2} — AB (ref >90)
GLUCOSE: 98 mg/dL (ref 70–140)
POTASSIUM: 3.5 meq/L (ref 3.5–5.1)
SODIUM: 142 meq/L (ref 136–145)
TOTAL PROTEIN: 6.8 g/dL (ref 6.4–8.3)

## 2015-11-24 LAB — PSA: PROSTATE SPECIFIC AG, SERUM: 1.5 ng/mL (ref 0.0–4.0)

## 2015-11-25 ENCOUNTER — Ambulatory Visit (HOSPITAL_BASED_OUTPATIENT_CLINIC_OR_DEPARTMENT_OTHER): Payer: Medicare Other | Admitting: Oncology

## 2015-11-25 ENCOUNTER — Telehealth: Payer: Self-pay | Admitting: Oncology

## 2015-11-25 VITALS — BP 167/83 | HR 78 | Temp 97.5°F | Resp 20 | Ht 75.0 in | Wt 218.1 lb

## 2015-11-25 DIAGNOSIS — Z86711 Personal history of pulmonary embolism: Secondary | ICD-10-CM

## 2015-11-25 DIAGNOSIS — E291 Testicular hypofunction: Secondary | ICD-10-CM | POA: Diagnosis not present

## 2015-11-25 DIAGNOSIS — Z86718 Personal history of other venous thrombosis and embolism: Secondary | ICD-10-CM

## 2015-11-25 DIAGNOSIS — S065X9A Traumatic subdural hemorrhage with loss of consciousness of unspecified duration, initial encounter: Secondary | ICD-10-CM | POA: Diagnosis not present

## 2015-11-25 DIAGNOSIS — C61 Malignant neoplasm of prostate: Secondary | ICD-10-CM | POA: Diagnosis not present

## 2015-11-25 DIAGNOSIS — I10 Essential (primary) hypertension: Secondary | ICD-10-CM | POA: Diagnosis not present

## 2015-11-25 NOTE — Telephone Encounter (Signed)
Gave patient avs report and a[ppointments for [employer]

## 2015-11-25 NOTE — Progress Notes (Signed)
Hematology and Oncology Follow Up Visit  [name] [id number] [birth year] 70 y.o. 11/25/2015 8:48 AM   Principle Diagnosis: 70 year old gentleman with prostate cancer diagnosed in 2003. He had a Gleason score 4 + 3 equals 7. PSA 6.45.   Prior Therapy:  1. He is status post a prostatectomy and lymph node dissection done on November 05, 2001. He had a Gleason score 4 + 3 equals 7. He had  focal extracapsular extension, both seminal vesicles were involved indicating his stage was T3b N0.  2. The patient received a total of 6480 cGy of radiation therapy in 36 fractions between February and April 2005.  3. The patient had a rise in August of 2011 up to 35 and he was started on Lupron. His PSA nadir down to 4.11. Most recently his PSA was up to 5.91 in December of 2012 with a testosterone level of 35. His staging workup did not reveal any bony metastasis. His his PSA went up to 10 in 04/2011.  4. Casodex was added to Lupron on 05/2011. This was discontinued in January 2016 due to progression of disease.  Current therapy: Zytiga 1000 mg daily started on 05/13/2014.  Interim History: [name] presents today for a followup visit. Since his last visit, he developed a subdural hematoma without any clear-cut trauma. He reported around July 2017 had minor trauma to his head and subsequently started developing headaches and left arm numbness. He was found to have right subdural hematoma that is subacute in nature and underwent evacuation by Dr. Saintclair Nichols on 09/14/2015. He was on warfarin at that time but his level was therapeutic.  He recovered well from surgery and have regained most activities of daily living. He has no neurological deficits although periodically he still have some headaches. 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  He continues to be on Zytiga without any major changes. He was able to continue it while he was hospitalized for his surgery. He denied any nausea, lower  extremity swelling or electrolyte imbalance. His quality of life remain unchanged because of this medication.    He has not reported any blurry vision, seizure or syncope. He does not report any fevers, chills or sweats. Has not reported any chest pain breath. Has not reported any palpitation or leg edema. He does not report any wheezing, cough or hemoptysis. He did not report any nausea or vomiting or abdominal pain. He has not reported any lymphadenopathy or petechiae. Rest of the review of systems unremarkable.   Medications: I have reviewed the patient's current medications.  Current Outpatient Prescriptions  Medication Sig Dispense Refill  . abiraterone Acetate (ZYTIGA) 250 MG tablet Take 4 tablets (1,000 mg total) by mouth daily. Take on an empty stomach 1 hour before or 2 hours after a meal 120 tablet 0  . ALPRAZolam (XANAX) 0.5 MG tablet Take 0.5 mg by mouth 3 (three) times daily as needed for sleep.    Marland Kitchen Leuprolide Acetate (LUPRON DEPOT IM) Inject into the muscle every 4 (four) months.    . levETIRAcetam (KEPPRA) 500 MG tablet Take 1 tablet (500 mg total) by mouth 2 (two) times daily. 60 tablet 3   No current facility-administered medications for this visit.     Allergies:  Allergies  Allergen Reactions  . Penicillins Rash and Other (See Comments)    Has patient had a PCN reaction causing immediate rash, facial/tongue/throat swelling, SOB or lightheadedness with hypotension: YES Has patient had a PCN reaction causing  severe rash involving mucus membranes or skin necrosis: YES Has patient had a PCN reaction that required hospitalization: NO Has patient had a PCN reaction occurring within the last 10 years: NO     Past Medical History, Surgical history, Social history, and Family History were reviewed and updated.  Physical Exam: Blood pressure (!) 167/83, pulse 78, temperature 97.5 F (36.4 C), temperature source Oral, resp. rate 20, height 6\' 3"  (1.905 m), weight 218 lb 1.6  oz (98.9 kg), SpO2 100 %. ECOG: 0 General appearance: Well-appearing gentleman without distress. Head: Normocephalic, without obvious abnormality no oral ulcers or lesions. Neck: no adenopathy no thyroid masses. Lymph nodes: Cervical, supraclavicular, and axillary nodes normal. Heart:regular rate and rhythm, S1, S2 normal, no murmur, click, rub or gallop Lung:chest clear, no wheezing, rales, normal symmetric air entry Abdomin: soft, non-tender, without masses or organomegaly no rebound or guarding. EXT:no erythema, induration, or nodules Skin: No erythema or rashes.  Lab Results: Lab Results  Component Value Date   WBC 6.9 11/23/2015   HGB 12.8 (L) 11/23/2015   HCT 38.3 (L) 11/23/2015   MCV 89.5 11/23/2015   PLT 192 11/23/2015     Chemistry      Component Value Date/Time   NA 142 11/23/2015 0800   K 3.5 11/23/2015 0800   CL 108 09/17/2015 0536   CL 104 06/11/2012 0817   CO2 25 11/23/2015 0800   BUN 14.2 11/23/2015 0800   CREATININE 0.9 11/23/2015 0800      Component Value Date/Time   CALCIUM 9.0 11/23/2015 0800   ALKPHOS 72 11/23/2015 0800   AST 18 11/23/2015 0800   ALT 17 11/23/2015 0800   BILITOT 0.61 11/23/2015 0800         Results for George, Marder LEVONNE Nichols (MRN [id number]) as of 11/25/2015 08:08  Ref. Range 05/24/2015 11:19 08/19/2015 10:14 11/23/2015 08:00  PSA Latest Ref Range: 0.0 - 4.0 ng/mL 1.6 1.8 1.5       Impression and Plan:  1. This is a pleasant 70 year old gentleman with prostate cancer. He has castration resistant disease and biochemical relapse. Casodex withdrawal between January 2016 and February 2016 did not yield any benefit.   He started Zytiga on 05/13/2014 without any complications. His PSA continues to have excellent response and currently at 1.5 which is not dramatically changed in the last 6 months. The plan is to continue with the same dose and schedule without any changes.   2. Androgen deprivation. He continues to be on Lupron under  the care of Dr. Risa Grill.   3. Bony disease. His bone scan done in 06/01/2013 was normal.  4. 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 Clearly because of his subdural hematoma he will be off anticoagulation. His risks of developing another thrombosis were reviewed today and I offered him a screening for thrombophilia and he declined. I feel that if he minimizes his risks which including immobility, adequate hydration and wear compression stockings when he travels can improve his chances of preventing any future blood clots..  5. Hypertension: His blood pressure is elevated today but he does report normal blood pressure outside of the clinic.   6. Subdural hematoma: Status post evacuation in July 2017. Etiology is unclear and certainly exacerbated by warfarin. His INR was therapeutic at that time. He continues to follow with neurosurgery and has not reported any neurological deficits or seizures.  7. Follow up: in 3 months  or sooner if needed to.   [facility], MD 9/29/20178:48 AM

## 2015-12-26 ENCOUNTER — Other Ambulatory Visit: Payer: Self-pay | Admitting: *Deleted

## 2015-12-26 MED ORDER — ABIRATERONE ACETATE 250 MG PO TABS: 1000.0000 mg | ORAL_TABLET | Freq: Every day | ORAL | 0 refills | Status: DC

## 2015-12-26 MED FILL — ZYTIGA 250 MG TABLET: 250 | 30 days supply | Qty: 120 | Fill #0

## 2016-01-18 ENCOUNTER — Other Ambulatory Visit: Payer: Self-pay | Admitting: *Deleted

## 2016-01-18 ENCOUNTER — Other Ambulatory Visit: Payer: Self-pay | Admitting: Neurosurgery

## 2016-01-18 DIAGNOSIS — S065X9A Traumatic subdural hemorrhage with loss of consciousness of unspecified duration, initial encounter: Secondary | ICD-10-CM

## 2016-01-18 DIAGNOSIS — S065XAA Traumatic subdural hemorrhage with loss of consciousness status unknown, initial encounter: Secondary | ICD-10-CM

## 2016-01-18 MED ORDER — ABIRATERONE ACETATE 250 MG PO TABS: 1000.0000 mg | ORAL_TABLET | Freq: Every day | ORAL | 0 refills | Status: DC

## 2016-01-18 MED FILL — ZYTIGA 250 MG TABLET: 250 | 30 days supply | Qty: 120 | Fill #0

## 2016-02-14 ENCOUNTER — Ambulatory Visit
Admission: RE | Admit: 2016-02-14 | Discharge: 2016-02-14 | Disposition: A | Discharge status: Home / Self Care | Payer: Medicare Other | Source: Ambulatory Visit | Attending: Neurosurgery | Admitting: Neurosurgery

## 2016-02-14 DIAGNOSIS — S065X9A Traumatic subdural hemorrhage with loss of consciousness of unspecified duration, initial encounter: Secondary | ICD-10-CM

## 2016-02-14 DIAGNOSIS — S065XAA Traumatic subdural hemorrhage with loss of consciousness status unknown, initial encounter: Secondary | ICD-10-CM

## 2016-02-21 ENCOUNTER — Other Ambulatory Visit: Payer: Self-pay | Admitting: *Deleted

## 2016-02-21 MED ORDER — ABIRATERONE ACETATE 250 MG PO TABS: 1000.0000 mg | ORAL_TABLET | Freq: Every day | ORAL | 0 refills | Status: DC

## 2016-02-21 MED FILL — ZYTIGA 250 MG TABLET: 250 | 30 days supply | Qty: 120 | Fill #0

## 2016-02-22 ENCOUNTER — Other Ambulatory Visit (HOSPITAL_BASED_OUTPATIENT_CLINIC_OR_DEPARTMENT_OTHER): Payer: Medicare Other

## 2016-02-22 DIAGNOSIS — C61 Malignant neoplasm of prostate: Secondary | ICD-10-CM

## 2016-02-22 LAB — CBC WITH DIFFERENTIAL/PLATELET
BASO%: 0.3 % (ref 0.0–2.0)
Basophils Absolute: 0 10*3/uL (ref 0.0–0.1)
EOS ABS: 0.3 10*3/uL (ref 0.0–0.5)
EOS%: 4.6 % (ref 0.0–7.0)
HCT: 38.5 % (ref 38.4–49.9)
HEMOGLOBIN: 13 g/dL (ref 13.0–17.1)
LYMPH#: 2.2 10*3/uL (ref 0.9–3.3)
LYMPH%: 33.4 % (ref 14.0–49.0)
MCH: 30 pg (ref 27.2–33.4)
MCHC: 33.8 g/dL (ref 32.0–36.0)
MCV: 88.9 fL (ref 79.3–98.0)
MONO#: 0.4 10*3/uL (ref 0.1–0.9)
MONO%: 6.6 % (ref 0.0–14.0)
NEUT%: 55.1 % (ref 39.0–75.0)
NEUTROS ABS: 3.6 10*3/uL (ref 1.5–6.5)
PLATELETS: 184 10*3/uL (ref 140–400)
RBC: 4.33 10*6/uL (ref 4.20–5.82)
RDW: 13.1 % (ref 11.0–14.6)
WBC: 6.5 10*3/uL (ref 4.0–10.3)

## 2016-02-22 LAB — COMPREHENSIVE METABOLIC PANEL
ALBUMIN: 3.4 g/dL — AB (ref 3.5–5.0)
ALK PHOS: 70 U/L (ref 40–150)
ALT: 13 U/L (ref 0–55)
ANION GAP: 10 meq/L (ref 3–11)
AST: 17 U/L (ref 5–34)
BILIRUBIN TOTAL: 0.74 mg/dL (ref 0.20–1.20)
BUN: 13.3 mg/dL (ref 7.0–26.0)
CO2: 25 mEq/L (ref 22–29)
CREATININE: 0.9 mg/dL (ref 0.7–1.3)
Calcium: 9.2 mg/dL (ref 8.4–10.4)
Chloride: 106 mEq/L (ref 98–109)
EGFR: 89 mL/min/{1.73_m2} — AB (ref >90)
GLUCOSE: 107 mg/dL (ref 70–140)
Potassium: 3.8 mEq/L (ref 3.5–5.1)
Sodium: 141 mEq/L (ref 136–145)
TOTAL PROTEIN: 7.1 g/dL (ref 6.4–8.3)

## 2016-02-23 LAB — PSA: PROSTATE SPECIFIC AG, SERUM: 1.9 ng/mL (ref 0.0–4.0)

## 2016-02-24 ENCOUNTER — Telehealth: Payer: Self-pay | Admitting: Oncology

## 2016-02-24 ENCOUNTER — Encounter: Payer: Self-pay | Admitting: Medical Oncology

## 2016-02-24 ENCOUNTER — Ambulatory Visit (HOSPITAL_BASED_OUTPATIENT_CLINIC_OR_DEPARTMENT_OTHER): Payer: Medicare Other | Admitting: Oncology

## 2016-02-24 VITALS — BP 167/85 | HR 74 | Temp 97.6°F | Resp 18 | Ht 75.0 in | Wt 226.9 lb

## 2016-02-24 DIAGNOSIS — Z86711 Personal history of pulmonary embolism: Secondary | ICD-10-CM

## 2016-02-24 DIAGNOSIS — I10 Essential (primary) hypertension: Secondary | ICD-10-CM | POA: Diagnosis not present

## 2016-02-24 DIAGNOSIS — E291 Testicular hypofunction: Secondary | ICD-10-CM

## 2016-02-24 DIAGNOSIS — C61 Malignant neoplasm of prostate: Secondary | ICD-10-CM

## 2016-02-24 DIAGNOSIS — Z86718 Personal history of other venous thrombosis and embolism: Secondary | ICD-10-CM

## 2016-02-24 NOTE — Progress Notes (Signed)
Hematology and Oncology Follow Up Visit  LYNARD BOITANO [id number] 19-Sep-1945 70 y.o. 02/24/2016 8:55 AM   Principle Diagnosis: 70 year old gentleman with prostate cancer diagnosed in 2003. He had a Gleason score 4 + 3 equals 7. PSA 6.45.   Prior Therapy:  1. He is status post a prostatectomy and lymph node dissection done on November 05, 2001. He had a Gleason score 4 + 3 equals 7. He had  focal extracapsular extension, both seminal vesicles were involved indicating his stage was T3b N0.  2. The patient received a total of 6480 cGy of radiation therapy in 36 fractions between February and April 2005.  3. The patient had a rise in August of 2011 up to 35 and he was started on Lupron. His PSA nadir down to 4.11. Most recently his PSA was up to 5.91 in December of 2012 with a testosterone level of 35. His staging workup did not reveal any bony metastasis. His his PSA went up to 10 in 04/2011.  4. Casodex was added to Lupron on 05/2011. This was discontinued in January 2016 due to progression of disease.  Current therapy: Zytiga 1000 mg daily started on 05/13/2014.  Interim History: Mr. Letter presents today for a followup visit. Since his last visit, he reports major changes in his health. His subdural hematoma has cleared by a CT scan criteria obtained on 02/14/2016. He is currently being tapered off Keppra. He denies any headaches, seizures or any neurological deficits. He denied any recent hospitalizations or falls. He remains active and attends to activities of daily living.  He continues to be on Zytiga without any major changes. He denied any nausea, lower extremity swelling or electrolyte imbalance. He denies any arthralgias, myalgias or urinary symptoms. He has no difficulties obtaining's medication or taking it on a regular basis.    He has not reported any blurry vision, seizure or syncope. He does not report any fevers, chills or sweats. Has not reported any chest pain breath.  Has not reported any palpitation or leg edema. He does not report any wheezing, cough or hemoptysis. He did not report any nausea or vomiting or abdominal pain. He has not reported any lymphadenopathy or petechiae. Rest of the review of systems unremarkable.   Medications: I have reviewed the patient's current medications.  Current Outpatient Prescriptions  Medication Sig Dispense Refill  . abiraterone Acetate (ZYTIGA) 250 MG tablet Take 4 tablets (1,000 mg total) by mouth daily. Take on an empty stomach 1 hour before or 2 hours after a meal 120 tablet 0  . ALPRAZolam (XANAX) 0.5 MG tablet Take 0.5 mg by mouth 3 (three) times daily as needed for sleep.    Marland Kitchen Leuprolide Acetate (LUPRON DEPOT IM) Inject into the muscle every 4 (four) months.    . levETIRAcetam (KEPPRA) 500 MG tablet Take 1 tablet (500 mg total) by mouth 2 (two) times daily. (Patient taking differently: Take 500 mg by mouth. Patient takes 1 tablet every 3rd day) 60 tablet 3   No current facility-administered medications for this visit.     Allergies:  Allergies  Allergen Reactions  . Penicillins Rash and Other (See Comments)    Has patient had a PCN reaction causing immediate rash, facial/tongue/throat swelling, SOB or lightheadedness with hypotension: YES Has patient had a PCN reaction causing severe rash involving mucus membranes or skin necrosis: YES Has patient had a PCN reaction that required hospitalization: NO Has patient had a PCN reaction occurring within the last 10 years: NO  Past Medical History, Surgical history, Social history, and Family History were reviewed and updated.  Physical Exam: Blood pressure (!) 167/85, pulse 74, temperature 97.6 F (36.4 C), temperature source Oral, resp. rate 18, height 6\' 3"  (1.905 m), weight 226 lb 14.4 oz (102.9 kg), SpO2 100 %. ECOG: 0 General appearance: Well-appearing gentleman without distress. Head: Normocephalic, without obvious abnormality no oral thrush  noted. Neck: no adenopathy no thyroid masses. Lymph nodes: Cervical, supraclavicular, and axillary nodes normal. Heart:regular rate and rhythm, S1, S2 normal, no murmur, click, rub or gallop Lung:chest clear, no wheezing, rales, normal symmetric air entry Abdomin: soft, non-tender, without masses or organomegaly no shifting dullness or ascites. EXT:no erythema, induration, or nodules Skin: No erythema or rashes.  Lab Results: Lab Results  Component Value Date   WBC 6.5 02/22/2016   HGB 13.0 02/22/2016   HCT 38.5 02/22/2016   MCV 88.9 02/22/2016   PLT 184 02/22/2016     Chemistry      Component Value Date/Time   NA 141 02/22/2016 0741   K 3.8 02/22/2016 0741   CL 108 09/17/2015 0536   CL 104 06/11/2012 0817   CO2 25 02/22/2016 0741   BUN 13.3 02/22/2016 0741   CREATININE 0.9 02/22/2016 0741      Component Value Date/Time   CALCIUM 9.2 02/22/2016 0741   ALKPHOS 70 02/22/2016 0741   AST 17 02/22/2016 0741   ALT 13 02/22/2016 0741   BILITOT 0.74 02/22/2016 0741     Results for Baucom, JIOVANY SCHNICK (MRN [id number]) as of 02/24/2016 08:42  Ref. Range 08/19/2015 10:14 11/23/2015 08:00 02/22/2016 07:42  PSA Latest Ref Range: 0.0 - 4.0 ng/mL 1.8 1.5 1.9      Impression and Plan:  1. This is a pleasant 70 year old gentleman with prostate cancer. He has castration resistant disease and biochemical relapse. Casodex withdrawal between January 2016 and February 2016 did not yield any benefit.   He started [place] on 05/13/2014 And continues to tolerated very well.   His PSA remains under reasonable control currently at 1.9. The plan is to continue the same dose and schedule without any modifications. Since I was therapy will be utilized upon symptomatic progression.   2. Androgen deprivation. He continues to be on Lupron under the care of Dr. Risa Grill.   3. Bony disease. His bone scan done in 06/01/2013 was normal.  4. 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 We have discussed different strategies to minimize his risk of thrombosis at this time.  5. Hypertension: His blood pressure is elevated today but he does report normal blood pressure outside of the clinic.   6. Subdural hematoma: Status post evacuation in July 2017. Etiology is unclear and certainly exacerbated by warfarin. This has resolved at this time with a repeat CT scan on 02/14/2016.  7. Follow up: in 3 months or sooner if needed to.   [id number], MD 12/29/20178:55 AM

## 2016-02-24 NOTE — Telephone Encounter (Signed)
Appointments scheduled per 12/29 LOS. Patient given AVS report and calendars with future scheduled appointments. °

## 2016-03-20 ENCOUNTER — Other Ambulatory Visit: Payer: Self-pay | Admitting: *Deleted

## 2016-03-20 MED ORDER — ABIRATERONE ACETATE 250 MG PO TABS: 1000.0000 mg | ORAL_TABLET | Freq: Every day | ORAL | 0 refills | Status: DC

## 2016-03-20 MED FILL — ZYTIGA 250 MG TABLET: 250 | 30 days supply | Qty: 120 | Fill #0

## 2016-04-18 ENCOUNTER — Other Ambulatory Visit: Payer: Self-pay | Admitting: *Deleted

## 2016-04-18 MED ORDER — ABIRATERONE ACETATE 250 MG PO TABS: 1000.0000 mg | ORAL_TABLET | Freq: Every day | ORAL | 0 refills | Status: DC

## 2016-04-18 MED FILL — ZYTIGA 250 MG TABLET: 250 | 30 days supply | Qty: 120 | Fill #0

## 2016-05-23 ENCOUNTER — Other Ambulatory Visit (HOSPITAL_BASED_OUTPATIENT_CLINIC_OR_DEPARTMENT_OTHER): Payer: Medicare Other

## 2016-05-23 DIAGNOSIS — C61 Malignant neoplasm of prostate: Secondary | ICD-10-CM | POA: Diagnosis not present

## 2016-05-23 LAB — CBC WITH DIFFERENTIAL/PLATELET
BASO%: 0.5 % (ref 0.0–2.0)
BASOS ABS: 0 10*3/uL (ref 0.0–0.1)
EOS%: 5 % (ref 0.0–7.0)
Eosinophils Absolute: 0.3 10*3/uL (ref 0.0–0.5)
HCT: 39.8 % (ref 38.4–49.9)
HGB: 13.6 g/dL (ref 13.0–17.1)
LYMPH%: 33.6 % (ref 14.0–49.0)
MCH: 30.5 pg (ref 27.2–33.4)
MCHC: 34.2 g/dL (ref 32.0–36.0)
MCV: 89.2 fL (ref 79.3–98.0)
MONO#: 0.4 10*3/uL (ref 0.1–0.9)
MONO%: 7.2 % (ref 0.0–14.0)
NEUT#: 3.2 10*3/uL (ref 1.5–6.5)
NEUT%: 53.7 % (ref 39.0–75.0)
Platelets: 190 10*3/uL (ref 140–400)
RBC: 4.46 10*6/uL (ref 4.20–5.82)
RDW: 12.8 % (ref 11.0–14.6)
WBC: 6 10*3/uL (ref 4.0–10.3)
lymph#: 2 10*3/uL (ref 0.9–3.3)

## 2016-05-23 LAB — COMPREHENSIVE METABOLIC PANEL
ALT: 15 U/L (ref 0–55)
AST: 16 U/L (ref 5–34)
Albumin: 3.7 g/dL (ref 3.5–5.0)
Alkaline Phosphatase: 77 U/L (ref 40–150)
Anion Gap: 10 mEq/L (ref 3–11)
BUN: 14.4 mg/dL (ref 7.0–26.0)
CALCIUM: 9.5 mg/dL (ref 8.4–10.4)
CHLORIDE: 105 meq/L (ref 98–109)
CO2: 26 mEq/L (ref 22–29)
Creatinine: 0.9 mg/dL (ref 0.7–1.3)
EGFR: 86 mL/min/{1.73_m2} — AB (ref >90)
GLUCOSE: 100 mg/dL (ref 70–140)
POTASSIUM: 3.6 meq/L (ref 3.5–5.1)
SODIUM: 141 meq/L (ref 136–145)
Total Bilirubin: 0.87 mg/dL (ref 0.20–1.20)
Total Protein: 7.3 g/dL (ref 6.4–8.3)

## 2016-05-24 LAB — PSA: PROSTATE SPECIFIC AG, SERUM: 3.2 ng/mL (ref 0.0–4.0)

## 2016-05-25 ENCOUNTER — Other Ambulatory Visit: Payer: Self-pay | Admitting: Internal Medicine

## 2016-05-25 ENCOUNTER — Other Ambulatory Visit: Payer: Self-pay | Admitting: *Deleted

## 2016-05-25 ENCOUNTER — Ambulatory Visit (HOSPITAL_BASED_OUTPATIENT_CLINIC_OR_DEPARTMENT_OTHER): Payer: Medicare Other | Admitting: Oncology

## 2016-05-25 VITALS — BP 167/83 | HR 76 | Temp 98.1°F | Resp 18 | Ht 75.0 in | Wt 225.8 lb

## 2016-05-25 DIAGNOSIS — E291 Testicular hypofunction: Secondary | ICD-10-CM

## 2016-05-25 DIAGNOSIS — Z86718 Personal history of other venous thrombosis and embolism: Secondary | ICD-10-CM

## 2016-05-25 DIAGNOSIS — Z0001 Encounter for general adult medical examination with abnormal findings: Secondary | ICD-10-CM

## 2016-05-25 DIAGNOSIS — I10 Essential (primary) hypertension: Secondary | ICD-10-CM

## 2016-05-25 DIAGNOSIS — C61 Malignant neoplasm of prostate: Secondary | ICD-10-CM

## 2016-05-25 DIAGNOSIS — Z86711 Personal history of pulmonary embolism: Secondary | ICD-10-CM

## 2016-05-25 MED ORDER — ABIRATERONE ACETATE 250 MG PO TABS: 1000.0000 mg | ORAL_TABLET | Freq: Every day | ORAL | 0 refills | Status: DC

## 2016-05-25 MED FILL — ZYTIGA 250 MG TABLET: 250 | 30 days supply | Qty: 120 | Fill #0

## 2016-05-25 NOTE — Progress Notes (Signed)
Hematology and Oncology Follow Up Visit  [name] [id number] Nov 30, 1945 71 y.o. 05/25/2016 9:22 AM   Principle Diagnosis: 71 year old gentleman with prostate cancer diagnosed in 2003. He had a Gleason score 4 + 3 equals 7. PSA 6.45.   Prior Therapy:  1. He is status post a prostatectomy and lymph node dissection done on November 05, 2001. He had a Gleason score 4 + 3 equals 7. He had  focal extracapsular extension, both seminal vesicles were involved indicating his stage was T3b N0.  2. The patient received a total of 6480 cGy of radiation therapy in 36 fractions between February and April 2005.  3. The patient had a rise in August of 2011 up to 35 and he was started on Lupron. His PSA nadir down to 4.11. Most recently his PSA was up to 5.91 in December of 2012 with a testosterone level of 35. His staging workup did not reveal any bony metastasis. His his PSA went up to 10 in 04/2011.  4. Casodex was added to Lupron on 05/2011. This was discontinued in January 2016 due to progression of disease.  Current therapy: Zytiga 1000 mg daily started on 05/13/2014.  Interim History: [name] presents today for a followup visit. Since his last visit, he reports doing well without any recent complaints. He denies any headaches, seizures or any neurological deficits. He denied any recent hospitalizations or falls. He remains active and attends to activities of daily living. He continues to be on Zytiga without any major changes. He denied any nausea, lower extremity swelling. He denies any arthralgias, myalgias or urinary symptoms. He reports his blood pressure is within normal range checked periodically in other locations.    He has not reported any blurry vision, seizure or syncope. He does not report any fevers, chills or sweats. Has not reported any chest pain breath. Has not reported any palpitation or leg edema. He does not report any wheezing, cough or hemoptysis. He did not report any  nausea or vomiting or abdominal pain. He has not reported any lymphadenopathy or petechiae. Rest of the review of systems unremarkable.   Medications: I have reviewed the patient's current medications.  Current Outpatient Prescriptions  Medication Sig Dispense Refill  . abiraterone Acetate (ZYTIGA) 250 MG tablet Take 4 tablets (1,000 mg total) by mouth daily. Take on an empty stomach 1 hour before or 2 hours after a meal 120 tablet 0  . ALPRAZolam (XANAX) 0.5 MG tablet Take 0.5 mg by mouth 3 (three) times daily as needed for sleep.    Marland Kitchen Leuprolide Acetate (LUPRON DEPOT IM) Inject into the muscle every 4 (four) months.    . levETIRAcetam (KEPPRA) 500 MG tablet Take 1 tablet (500 mg total) by mouth 2 (two) times daily. (Patient taking differently: Take 500 mg by mouth. Patient takes 1 tablet every 3rd day) 60 tablet 3   No current facility-administered medications for this visit.     Allergies:  Allergies  Allergen Reactions  . Penicillins Rash and Other (See Comments)    Has patient had a PCN reaction causing immediate rash, facial/tongue/throat swelling, SOB or lightheadedness with hypotension: YES Has patient had a PCN reaction causing severe rash involving mucus membranes or skin necrosis: YES Has patient had a PCN reaction that required hospitalization: NO Has patient had a PCN reaction occurring within the last 10 years: NO     Past Medical History, Surgical history, Social history, and Family History were reviewed and updated.  Physical Exam: Blood  pressure (!) 167/83, pulse 76, temperature 98.1 F (36.7 C), temperature source Oral, resp. rate 18, height 6\' 3"  (1.905 m), weight 225 lb 12.8 oz (102.4 kg), SpO2 100 %. ECOG: 0 General appearance: Alert, awake gentleman without distress. Head: Normocephalic, without obvious abnormality no oral thrush noted. Neck: no adenopathy no thyroid masses. Lymph nodes: Cervical, supraclavicular, and axillary nodes normal. Heart:regular rate  and rhythm, S1, S2 normal, no murmur, click, rub or gallop Lung:chest clear, no wheezing, rales, normal symmetric air entry Abdomin: soft, non-tender, without masses or organomegaly no rebound or guarding. EXT:no erythema, induration, or nodules Skin: No erythema or rashes.  Lab Results: Lab Results  Component Value Date   WBC 6.0 05/23/2016   HGB 13.6 05/23/2016   HCT 39.8 05/23/2016   MCV 89.2 05/23/2016   PLT 190 05/23/2016     Chemistry      Component Value Date/Time   NA 141 05/23/2016 0805   K 3.6 05/23/2016 0805   CL 108 09/17/2015 0536   CL 104 06/11/2012 0817   CO2 26 05/23/2016 0805   BUN 14.4 05/23/2016 0805   CREATININE 0.9 05/23/2016 0805      Component Value Date/Time   CALCIUM 9.5 05/23/2016 0805   ALKPHOS 77 05/23/2016 0805   AST 16 05/23/2016 0805   ALT 15 05/23/2016 0805   BILITOT 0.87 05/23/2016 0805       Results for Feuerborn, [name] (MRN [id number]) as of 05/25/2016 09:19  Ref. Range 11/23/2015 08:00 02/22/2016 07:42 05/23/2016 08:05  PSA Latest Ref Range: 0.0 - 4.0 ng/mL 1.5 1.9 3.2     Impression and Plan:  1. This is a pleasant 71 year old gentleman with prostate cancer. He has castration resistant disease and biochemical relapse. Casodex withdrawal between January 2016 and February 2016 did not yield any benefit.   He started Zytiga on 05/13/2014 and continues to tolerated very well.   His PSA slightly increased over the last 6 months but remains under reasonable control. Risks and benefits of this medication was reviewed today and he is agreeable to continue. Different salvage therapy will be utilized if he develops rapid PSA rise.   2. Androgen deprivation. He continues to be on Lupron under the care of Dr. Risa Grill.   3. Bony disease. His bone scan done in 06/01/2013 was normal.  4. 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 I have recommended  low-dose aspirin instead of full dose anticoagulation because of his bleeding risk.  5. Hypertension: His blood pressure is elevated today but he does report normal blood pressure outside of the clinic.   6. Subdural hematoma: Status post evacuation in July 2017. Etiology is unclear and certainly exacerbated by warfarin. This has resolved at this time with a repeat CT scan on 02/14/2016.  7. Follow up: in 3 months or sooner if needed to.   [facility], MD 3/30/20189:22 AM

## 2016-06-08 ENCOUNTER — Ambulatory Visit
Admission: RE | Admit: 2016-06-08 | Discharge: 2016-06-08 | Disposition: A | Discharge status: Home / Self Care | Payer: Medicare Other | Source: Ambulatory Visit | Attending: Internal Medicine | Admitting: Internal Medicine

## 2016-06-08 DIAGNOSIS — Z0001 Encounter for general adult medical examination with abnormal findings: Secondary | ICD-10-CM

## 2016-06-19 ENCOUNTER — Other Ambulatory Visit: Payer: Self-pay | Admitting: *Deleted

## 2016-06-19 MED ORDER — ABIRATERONE ACETATE 250 MG PO TABS: 1000.0000 mg | ORAL_TABLET | Freq: Every day | ORAL | 0 refills | Status: DC

## 2016-06-19 MED FILL — ZYTIGA 250 MG TABLET: 250 | 30 days supply | Qty: 120 | Fill #0

## 2016-06-19 NOTE — Telephone Encounter (Signed)
Per patient's request, zytiga refilled at [facility] long outpatient pharmacy.

## 2016-07-10 ENCOUNTER — Other Ambulatory Visit: Payer: Self-pay | Admitting: Oncology

## 2016-07-12 MED FILL — ZYTIGA 250 MG TABLET: 250 | 30 days supply | Qty: 120 | Fill #0

## 2016-08-07 MED FILL — ZYTIGA 250 MG TABLET: 250 | 30 days supply | Qty: 120 | Fill #0

## 2016-08-22 ENCOUNTER — Other Ambulatory Visit (HOSPITAL_BASED_OUTPATIENT_CLINIC_OR_DEPARTMENT_OTHER): Payer: Medicare Other

## 2016-08-22 DIAGNOSIS — C61 Malignant neoplasm of prostate: Secondary | ICD-10-CM | POA: Diagnosis not present

## 2016-08-22 LAB — COMPREHENSIVE METABOLIC PANEL
ALT: 14 U/L (ref 0–55)
ANION GAP: 11 meq/L (ref 3–11)
AST: 15 U/L (ref 5–34)
Albumin: 3.3 g/dL — ABNORMAL LOW (ref 3.5–5.0)
Alkaline Phosphatase: 67 U/L (ref 40–150)
BUN: 13.7 mg/dL (ref 7.0–26.0)
CHLORIDE: 107 meq/L (ref 98–109)
CO2: 25 meq/L (ref 22–29)
CREATININE: 0.9 mg/dL (ref 0.7–1.3)
Calcium: 9.3 mg/dL (ref 8.4–10.4)
EGFR: 83 mL/min/{1.73_m2} — ABNORMAL LOW (ref >90)
Glucose: 141 mg/dl — ABNORMAL HIGH (ref 70–140)
POTASSIUM: 4 meq/L (ref 3.5–5.1)
Sodium: 143 mEq/L (ref 136–145)
Total Bilirubin: 0.75 mg/dL (ref 0.20–1.20)
Total Protein: 6.8 g/dL (ref 6.4–8.3)

## 2016-08-22 LAB — CBC WITH DIFFERENTIAL/PLATELET
BASO%: 0.4 % (ref 0.0–2.0)
Basophils Absolute: 0 10*3/uL (ref 0.0–0.1)
EOS%: 4.6 % (ref 0.0–7.0)
Eosinophils Absolute: 0.3 10*3/uL (ref 0.0–0.5)
HCT: 38.6 % (ref 38.4–49.9)
HGB: 13.4 g/dL (ref 13.0–17.1)
LYMPH%: 27.9 % (ref 14.0–49.0)
MCH: 31.2 pg (ref 27.2–33.4)
MCHC: 34.7 g/dL (ref 32.0–36.0)
MCV: 90 fL (ref 79.3–98.0)
MONO#: 0.4 10*3/uL (ref 0.1–0.9)
MONO%: 5.6 % (ref 0.0–14.0)
NEUT#: 4.2 10*3/uL (ref 1.5–6.5)
NEUT%: 61.5 % (ref 39.0–75.0)
Platelets: 210 10*3/uL (ref 140–400)
RBC: 4.29 10*6/uL (ref 4.20–5.82)
RDW: 13.4 % (ref 11.0–14.6)
WBC: 6.8 10*3/uL (ref 4.0–10.3)
lymph#: 1.9 10*3/uL (ref 0.9–3.3)

## 2016-08-23 LAB — PSA: PROSTATE SPECIFIC AG, SERUM: 4.7 ng/mL — AB (ref 0.0–4.0)

## 2016-08-24 ENCOUNTER — Ambulatory Visit (HOSPITAL_BASED_OUTPATIENT_CLINIC_OR_DEPARTMENT_OTHER): Payer: Medicare Other | Admitting: Oncology

## 2016-08-24 ENCOUNTER — Telehealth: Payer: Self-pay | Admitting: Oncology

## 2016-08-24 VITALS — BP 178/86 | HR 80 | Temp 97.9°F | Resp 18 | Ht 75.0 in | Wt 230.0 lb

## 2016-08-24 DIAGNOSIS — C61 Malignant neoplasm of prostate: Secondary | ICD-10-CM

## 2016-08-24 DIAGNOSIS — I10 Essential (primary) hypertension: Secondary | ICD-10-CM

## 2016-08-24 NOTE — Progress Notes (Signed)
Hematology and Oncology Follow Up Visit  [name] [id number] [birth year] 71 y.o. 08/24/2016 8:37 AM   Principle Diagnosis: 71 year old gentleman with prostate cancer diagnosed in 2003. He had a Gleason score 4 + 3 equals 7. PSA 6.45.   Prior Therapy:  1. He is status post a prostatectomy and lymph node dissection done on November 05, 2001. He had a Gleason score 4 + 3 equals 7. He had  focal extracapsular extension, both seminal vesicles were involved indicating his stage was T3b N0.  2. The patient received a total of 6480 cGy of radiation therapy in 36 fractions between February and April 2005.  3. The patient had a rise in August of 2011 up to 35 and he was started on Lupron. His PSA nadir down to 4.11. Most recently his PSA was up to 5.91 in December of 2012 with a testosterone level of 35. His staging workup did not reveal any bony metastasis. His his PSA went up to 10 in 04/2011.  4. Casodex was added to Lupron on 05/2011. This was discontinued in January 2016 due to progression of disease.  Current therapy: Zytiga 1000 mg daily started on 05/13/2014.  Interim History: [name] presents today for a followup visit. Since his last visit, he reports no changes in his health. 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 He denied any bleeding or thrombosis episodes. He has no difficulties obtaining Zytiga. He denies any headaches, seizures or any neurological deficits. He denied any recent hospitalizations or falls. He remains active and attends to activities of daily living. He continues to be on Zytiga without any major changes. He denied any nausea, lower extremity swelling. He denies any arthralgias, myalgias or urinary symptoms.     He has not reported any blurry vision, seizure or syncope. He does not report any fevers, chills or sweats. Has not reported any chest pain breath. Has not reported any palpitation or leg edema. He does not report any wheezing,  cough or hemoptysis. He did not report any nausea or vomiting or abdominal pain. He has not reported any lymphadenopathy or petechiae. Rest of the review of systems unremarkable.   Medications: I have reviewed the patient's current medications.  Current Outpatient Prescriptions  Medication Sig Dispense Refill  . ALPRAZolam (XANAX) 0.5 MG tablet Take 0.5 mg by mouth 3 (three) times daily as needed for sleep.    Marland Kitchen Leuprolide Acetate (LUPRON DEPOT IM) Inject into the muscle every 4 (four) months.    . levETIRAcetam (KEPPRA) 500 MG tablet Take 1 tablet (500 mg total) by mouth 2 (two) times daily. (Patient taking differently: Take 500 mg by mouth. Patient takes 1 tablet every 3rd day) 60 tablet 3  . ZYTIGA 250 MG tablet TAKE 4 TABLETS BY MOUTH DAILY ON AN EMPTY STOMACH 1 HOUR BEFORE OR 2 HOURS AFTER A MEAL 120 tablet 0   No current facility-administered medications for this visit.     Allergies:  Allergies  Allergen Reactions  . Penicillins Rash and Other (See Comments)    Has patient had a PCN reaction causing immediate rash, facial/tongue/throat swelling, SOB or lightheadedness with hypotension: YES Has patient had a PCN reaction causing severe rash involving mucus membranes or skin necrosis: YES Has patient had a PCN reaction that required hospitalization: NO Has patient had a PCN reaction occurring within the last 10 years: NO     Past Medical History, Surgical history, Social history, and Family History were reviewed and updated.  Physical Exam: Blood pressure (!) 178/86, pulse 80, temperature 97.9 F (36.6 C), temperature source Oral, resp. rate 18, height 6\' 3"  (1.905 m), weight 230 lb (104.3 kg), SpO2 100 %. ECOG: 0 General appearance:Well-appearing gentleman appeared without distress. Head: Normocephalic, without obvious abnormality no oral ulcers or lesions. Neck: no adenopathy no thyroid masses. Lymph nodes: Cervical, supraclavicular, and axillary nodes normal. Heart:regular  rate and rhythm, S1, S2 normal, no murmur, click, rub or gallop Lung:chest clear, no wheezing, rales, normal symmetric air entry Abdomin: soft, non-tender, without masses or organomegaly no shifting dullness or ascites. EXT:no erythema, induration, or nodules Skin: No erythema or rashes.  Lab Results: Lab Results  Component Value Date   WBC 6.8 08/22/2016   HGB 13.4 08/22/2016   HCT 38.6 08/22/2016   MCV 90.0 08/22/2016   PLT 210 08/22/2016     Chemistry      Component Value Date/Time   NA 143 08/22/2016 0807   K 4.0 08/22/2016 0807   CL 108 09/17/2015 0536   CL 104 06/11/2012 0817   CO2 25 08/22/2016 0807   BUN 13.7 08/22/2016 0807   CREATININE 0.9 08/22/2016 0807      Component Value Date/Time   CALCIUM 9.3 08/22/2016 0807   ALKPHOS 67 08/22/2016 0807   AST 15 08/22/2016 0807   ALT 14 08/22/2016 0807   BILITOT 0.75 08/22/2016 0807      Results for [name] (MRN [id number]) as of 08/24/2016 08:39  Ref. Range 02/22/2016 07:42 05/23/2016 08:05 08/22/2016 08:07  PSA Latest Ref Range: 0.0 - 4.0 ng/mL 1.9 3.2 4.7 (H)      Impression and Plan:  71 year old gentleman with the following issues:  1. Ccastration resistant disease and biochemical relapse. Casodex withdrawal between January 2016 and February 2016 did not yield any benefit.   He started Zytiga on 05/13/2014 and continues to tolerated very well.   His PSA continues to show slight increase with a doubling time of less than 6 months. He continues to be asymptomatic at this time with without any issues related to [facility]. The plan is to continue with the same dose and schedule and repeat staging workup if his PSA continues to rise.   2. Androgen deprivation. He continues to be on Lupron under the care of Dr. Risa Grill.   3. Bony disease. His bone scan done in 06/01/2013 was normal.  4. 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 I have recommended low-dose aspirin instead of full dose anticoagulation because of his bleeding risk.  5. Hypertension: His blood pressure is elevated today. I have asked him to record blood pressure readings outside of his visits.  6. Subdural hematoma: Status post evacuation in July 2017. Etiology is unclear and certainly exacerbated by warfarin. This has resolved at this time with a repeat CT scan on 02/14/2016.  7. Follow up: in 3 months or sooner if needed to.   [facility], MD 6/29/20188:37 AM

## 2016-08-24 NOTE — Telephone Encounter (Signed)
Scheduled appt per 6/29 los - Gave patient AVS and calender per los . Lab and f/u in 3 months.

## 2016-08-28 ENCOUNTER — Encounter: Payer: Self-pay | Admitting: *Deleted

## 2016-08-30 MED FILL — ZYTIGA 250 MG TABLET: 250 | 30 days supply | Qty: 120 | Fill #0

## 2016-10-01 ENCOUNTER — Other Ambulatory Visit: Payer: Self-pay | Admitting: Oncology

## 2016-10-10 MED FILL — ZYTIGA 250 MG TABLET: 250 | 30 days supply | Qty: 120 | Fill #0

## 2016-10-22 ENCOUNTER — Other Ambulatory Visit: Payer: Self-pay | Admitting: *Deleted

## 2016-10-22 ENCOUNTER — Other Ambulatory Visit: Payer: Self-pay | Admitting: Oncology

## 2016-11-02 MED FILL — ZYTIGA 250 MG TABLET: 250 | 30 days supply | Qty: 120 | Fill #0

## 2016-11-21 ENCOUNTER — Other Ambulatory Visit (HOSPITAL_BASED_OUTPATIENT_CLINIC_OR_DEPARTMENT_OTHER): Payer: Medicare Other

## 2016-11-21 DIAGNOSIS — C61 Malignant neoplasm of prostate: Secondary | ICD-10-CM | POA: Diagnosis not present

## 2016-11-21 LAB — COMPREHENSIVE METABOLIC PANEL
ALBUMIN: 3.3 g/dL — AB (ref 3.5–5.0)
ALK PHOS: 65 U/L (ref 40–150)
ALT: 12 U/L (ref 0–55)
ANION GAP: 9 meq/L (ref 3–11)
AST: 19 U/L (ref 5–34)
BUN: 17.3 mg/dL (ref 7.0–26.0)
CALCIUM: 9.8 mg/dL (ref 8.4–10.4)
CHLORIDE: 106 meq/L (ref 98–109)
CO2: 27 mEq/L (ref 22–29)
Creatinine: 0.9 mg/dL (ref 0.7–1.3)
EGFR: 87 mL/min/{1.73_m2} — AB (ref >90)
Glucose: 127 mg/dl (ref 70–140)
POTASSIUM: 4 meq/L (ref 3.5–5.1)
Sodium: 141 mEq/L (ref 136–145)
Total Bilirubin: 0.78 mg/dL (ref 0.20–1.20)
Total Protein: 6.9 g/dL (ref 6.4–8.3)

## 2016-11-21 LAB — CBC WITH DIFFERENTIAL/PLATELET
BASO%: 0.2 % (ref 0.0–2.0)
BASOS ABS: 0 10*3/uL (ref 0.0–0.1)
EOS ABS: 0.3 10*3/uL (ref 0.0–0.5)
EOS%: 5.1 % (ref 0.0–7.0)
HEMATOCRIT: 39.8 % (ref 38.4–49.9)
HEMOGLOBIN: 13.4 g/dL (ref 13.0–17.1)
LYMPH#: 2 10*3/uL (ref 0.9–3.3)
LYMPH%: 32.6 % (ref 14.0–49.0)
MCH: 30.7 pg (ref 27.2–33.4)
MCHC: 33.7 g/dL (ref 32.0–36.0)
MCV: 91.3 fL (ref 79.3–98.0)
MONO#: 0.4 10*3/uL (ref 0.1–0.9)
MONO%: 5.9 % (ref 0.0–14.0)
NEUT#: 3.5 10*3/uL (ref 1.5–6.5)
NEUT%: 56.2 % (ref 39.0–75.0)
PLATELETS: 182 10*3/uL (ref 140–400)
RBC: 4.36 10*6/uL (ref 4.20–5.82)
RDW: 12.8 % (ref 11.0–14.6)
WBC: 6.3 10*3/uL (ref 4.0–10.3)

## 2016-11-22 LAB — PSA: PROSTATE SPECIFIC AG, SERUM: 8.3 ng/mL — AB (ref 0.0–4.0)

## 2016-11-23 ENCOUNTER — Ambulatory Visit (HOSPITAL_BASED_OUTPATIENT_CLINIC_OR_DEPARTMENT_OTHER): Payer: Medicare Other | Admitting: Oncology

## 2016-11-23 ENCOUNTER — Telehealth: Payer: Self-pay | Admitting: Oncology

## 2016-11-23 VITALS — BP 173/91 | HR 78 | Temp 98.6°F | Resp 18 | Ht 75.0 in | Wt 231.7 lb

## 2016-11-23 DIAGNOSIS — Z86711 Personal history of pulmonary embolism: Secondary | ICD-10-CM

## 2016-11-23 DIAGNOSIS — I10 Essential (primary) hypertension: Secondary | ICD-10-CM | POA: Diagnosis not present

## 2016-11-23 DIAGNOSIS — E291 Testicular hypofunction: Secondary | ICD-10-CM

## 2016-11-23 DIAGNOSIS — C61 Malignant neoplasm of prostate: Secondary | ICD-10-CM | POA: Diagnosis not present

## 2016-11-23 DIAGNOSIS — Z86718 Personal history of other venous thrombosis and embolism: Secondary | ICD-10-CM | POA: Diagnosis not present

## 2016-11-23 NOTE — Progress Notes (Signed)
Educational packet and brochure re: xtandi given and explained to patient. Script given to oral chemotherapy navigator.

## 2016-11-23 NOTE — Progress Notes (Signed)
Hematology and Oncology Follow Up Visit  [name] [id number] 1945/03/03 71 y.o. 11/23/2016 10:17 AM   Principle Diagnosis: 71 year old gentleman with prostate cancer diagnosed in 2003. He had a Gleason score 4 + 3 equals 7. PSA 6.45.   Prior Therapy:  1. He is status post a prostatectomy and lymph node dissection done on November 05, 2001. He had a Gleason score 4 + 3 equals 7. He had  focal extracapsular extension, both seminal vesicles were involved indicating his stage was T3b N0.  2. The patient received a total of 6480 cGy of radiation therapy in 36 fractions between February and April 2005.  3. The patient had a rise in August of 2011 up to 35 and he was started on Lupron. His PSA nadir down to 4.11. Most recently his PSA was up to 5.91 in December of 2012 with a testosterone level of 35. His staging workup did not reveal any bony metastasis. His his PSA went up to 10 in 04/2011.  4. Casodex was added to Lupron on 05/2011. This was discontinued in January 2016 due to progression of disease.  Current therapy: Zytiga 1000 mg daily started on 05/13/2014.  Interim History: [name] presents today for a followup visit. Since his last visit, he reports no recent complaints. He was started on amlodipine for his blood pressure by his primary care physician. He has taken it for the last 24 hours without any complications. He denied any headaches or dizziness. He denied any neurological deficits. He denied any bleeding or thrombosis episodes. He remains active and attends to activities of daily living. He continues to be on Zytiga without any major changes. He denied any nausea, lower extremity swelling. He denies any arthralgias, myalgias or urinary symptoms. He denied any recent hospitalizations or illnesses.    He has not reported any blurry vision, seizure or syncope. He does not report any fevers, chills or sweats. Has not reported any chest pain breath. Has not reported any  palpitation or leg edema. He does not report any wheezing, cough or hemoptysis. He did not report any nausea or vomiting or abdominal pain. He has not reported any lymphadenopathy or petechiae. Rest of the review of systems unremarkable.   Medications: I have reviewed the patient's current medications.  Current Outpatient Prescriptions  Medication Sig Dispense Refill  . amLODipine (NORVASC) 5 MG tablet amlodipine 5 mg tablet  Take 1 tablet every day by oral route.    . Ascorbic Acid (VITAMIN C) 100 MG CHEW Vitamin C  1 once a day    . aspirin EC 81 MG tablet Take 81 mg by mouth daily.    . Cholecalciferol (VITAMIN D3) 3000 units TABS cholecalciferol (vitamin D3) 1,000 unit tablet  1 tablet orally once a day    . Leuprolide Acetate (LUPRON DEPOT IM) Inject into the muscle every 4 (four) months.    . levETIRAcetam (KEPPRA) 500 MG tablet Take 1 tablet (500 mg total) by mouth 2 (two) times daily. (Patient taking differently: Take 500 mg by mouth. Patient takes 1 tablet every 3rd day) 60 tablet 3  . Multiple Vitamins-Minerals (MULTIVITAMIN ADULT EXTRA C PO) multivitamin  1 tablet orally once a day    . Omega-3 Fatty Acids (FISH OIL) 1000 MG CAPS Fish Oil  2 tablet po daily    . ZYTIGA 250 MG tablet TAKE 4 TABLETS BY MOUTH DAILY. TAKE ON AN EMPTY STOMACH 1 HOUR BEFORE OR 2 HOURS AFTER A MEAL 120 tablet 0  .  ALPRAZolam (XANAX) 0.5 MG tablet Take 0.5 mg by mouth 3 (three) times daily as needed for sleep.     No current facility-administered medications for this visit.     Allergies:  Allergies  Allergen Reactions  . Penicillins Rash and Other (See Comments)    Has patient had a PCN reaction causing immediate rash, facial/tongue/throat swelling, SOB or lightheadedness with hypotension: YES Has patient had a PCN reaction causing severe rash involving mucus membranes or skin necrosis: YES Has patient had a PCN reaction that required hospitalization: NO Has patient had a PCN reaction occurring  within the last 10 years: NO     Past Medical History, Surgical history, Social history, and Family History were reviewed and updated.  Physical Exam: Blood pressure (!) 173/91, pulse 78, temperature 98.6 F (37 C), temperature source Oral, resp. rate 18, height 6\' 3"  (1.905 m), weight 231 lb 11.2 oz (105.1 kg), SpO2 100 %. ECOG: 0 General appearance: Alert, awake gentleman without distress. Head: Normocephalic, without obvious abnormality no oral thrush or ulcers. Neck: no adenopathy no thyroid masses. Lymph nodes: Cervical, supraclavicular, and axillary nodes normal. Heart:regular rate and rhythm, S1, S2 normal, no murmur, click, rub or gallop Lung:chest clear, no wheezing, rales, normal symmetric air entry Abdomin: soft, non-tender, without masses or organomegaly no rebound or guarding. EXT:no erythema, induration, or nodules Skin: No erythema or rashes.  Lab Results: Lab Results  Component Value Date   WBC 6.3 11/21/2016   HGB 13.4 11/21/2016   HCT 39.8 11/21/2016   MCV 91.3 11/21/2016   PLT 182 11/21/2016     Chemistry      Component Value Date/Time   NA 141 11/21/2016 0750   K 4.0 11/21/2016 0750   CL 108 09/17/2015 0536   CL 104 06/11/2012 0817   CO2 27 11/21/2016 0750   BUN 17.3 11/21/2016 0750   CREATININE 0.9 11/21/2016 0750      Component Value Date/Time   CALCIUM 9.8 11/21/2016 0750   ALKPHOS 65 11/21/2016 0750   AST 19 11/21/2016 0750   ALT 12 11/21/2016 0750   BILITOT 0.78 11/21/2016 0750     Results for [name] (MRN [id number]) as of 11/23/2016 09:52  Ref. 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 Casodex withdrawal between January 2016 and February 2016 did not yield any benefit.   He started Zytiga  on 05/13/2014 and continues to tolerated very well.   His PSA continues to rise and currently without rapid doubling time. The plan is to restage him with a CT scan before the next visit. Different salvage therapy may be needed if his PSA continues to rise or if he develops measurable disease.   2. Androgen deprivation. He continues to be on Lupron under the care of Dr. Risa Grill.   3. Bony disease. His bone scan done in 06/01/2013 was normal.  4. 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 He is currently on low-dose aspirin.  5. Hypertension: His blood pressure remains elevated but recently started on amlodipine. He is currently taking 5 mg and will be titrated higher by Dr. Karlton Lemon.  6. Subdural hematoma: Status post evacuation in July 2017. Etiology is  unclear and certainly exacerbated by warfarin. This has resolved at this time with a repeat CT scan on 02/14/2016.  7. Follow up: in December 2018.   Zola Button, MD 9/28/201810:17 AM

## 2016-11-23 NOTE — Telephone Encounter (Signed)
Scheduled appt per 9/28 los - Gave patient AVS and calender per los.  

## 2016-11-26 ENCOUNTER — Other Ambulatory Visit: Payer: Self-pay | Admitting: Oncology

## 2016-11-26 MED FILL — ZYTIGA 250 MG TABLET: 250 | 30 days supply | Qty: 120 | Fill #0

## 2016-12-19 ENCOUNTER — Other Ambulatory Visit: Payer: Self-pay | Admitting: Oncology

## 2016-12-19 MED FILL — ZYTIGA 250 MG TABLET: 250 | 30 days supply | Qty: 120 | Fill #0

## 2017-01-04 ENCOUNTER — Telehealth: Payer: Self-pay | Admitting: Pharmacist

## 2017-01-04 NOTE — Telephone Encounter (Signed)
Oral Chemotherapy Pharmacist Encounter  Follow-Up Form  Called patient today to follow up regarding patient's oral chemotherapy medication: Zytiga (abiraterone) for the treatment of castration-resistant prostate cancer  Original Start date of oral chemotherapy: 05/13/14  Pt is doing well today  Pt reports 0 tablets/doses of Zytiga 250mg  tablets, 4 tablets (1000mg ) by mouth once daily on an empty stomach missed in the last month. Patient wakes at 6am daily and eats breakfast. He takes his Zytiga at 8am daily, which is 2 hours after he eats. This schedule works well for him.  Pt reports the following side effects: mild lower extremity edema and hypertension. Patient will elevate legs for swelling. He states his PCP just started him on amlodipine for his BP. This will continue to be monitored.  Pertinent labs reviewed: OK for continued treatment.  Other Issues: 11/21/16 PSA rising, patient with CT scan planned for 02/01/17, office visit on 02/05/17 where alternative forms of treatment may be discussed based on scan results.  Patient knows to call the office with questions or concerns. Oral Oncology Clinic will continue to follow.  Thank you,  Johny Drilling, PharmD, BCPS, BCOP 01/04/2017 11:44 AM Oral Oncology Clinic [phone]

## 2017-01-10 ENCOUNTER — Other Ambulatory Visit: Payer: Self-pay | Admitting: Oncology

## 2017-02-01 ENCOUNTER — Other Ambulatory Visit (HOSPITAL_BASED_OUTPATIENT_CLINIC_OR_DEPARTMENT_OTHER): Payer: Medicare Other

## 2017-02-01 ENCOUNTER — Ambulatory Visit (HOSPITAL_COMMUNITY)
Admission: RE | Admit: 2017-02-01 | Discharge: 2017-02-01 | Disposition: A | Discharge status: Home / Self Care | Payer: Medicare Other | Source: Ambulatory Visit | Attending: Oncology | Admitting: Oncology

## 2017-02-01 DIAGNOSIS — Z923 Personal history of irradiation: Secondary | ICD-10-CM | POA: Insufficient documentation

## 2017-02-01 DIAGNOSIS — I700 Atherosclerosis of aorta: Secondary | ICD-10-CM | POA: Insufficient documentation

## 2017-02-01 DIAGNOSIS — Z9079 Acquired absence of other genital organ(s): Secondary | ICD-10-CM | POA: Diagnosis not present

## 2017-02-01 DIAGNOSIS — C61 Malignant neoplasm of prostate: Secondary | ICD-10-CM

## 2017-02-01 DIAGNOSIS — Z8546 Personal history of malignant neoplasm of prostate: Secondary | ICD-10-CM | POA: Diagnosis not present

## 2017-02-01 DIAGNOSIS — N62 Hypertrophy of breast: Secondary | ICD-10-CM | POA: Diagnosis not present

## 2017-02-01 DIAGNOSIS — K449 Diaphragmatic hernia without obstruction or gangrene: Secondary | ICD-10-CM | POA: Insufficient documentation

## 2017-02-01 LAB — COMPREHENSIVE METABOLIC PANEL
ALBUMIN: 3.6 g/dL (ref 3.5–5.0)
ALT: 14 U/L (ref 0–55)
AST: 17 U/L (ref 5–34)
Alkaline Phosphatase: 70 U/L (ref 40–150)
Anion Gap: 10 mEq/L (ref 3–11)
BUN: 14.5 mg/dL (ref 7.0–26.0)
CHLORIDE: 106 meq/L (ref 98–109)
CO2: 26 mEq/L (ref 22–29)
CREATININE: 0.9 mg/dL (ref 0.7–1.3)
Calcium: 9.2 mg/dL (ref 8.4–10.4)
EGFR: >60 mL/min/{1.73_m2} (ref >60)
GLUCOSE: 115 mg/dL (ref 70–140)
POTASSIUM: 3.5 meq/L (ref 3.5–5.1)
SODIUM: 141 meq/L (ref 136–145)
Total Bilirubin: 0.95 mg/dL (ref 0.20–1.20)
Total Protein: 7.3 g/dL (ref 6.4–8.3)

## 2017-02-01 LAB — CBC WITH DIFFERENTIAL/PLATELET
BASO%: 0.5 % (ref 0.0–2.0)
BASOS ABS: 0 10*3/uL (ref 0.0–0.1)
EOS%: 4.2 % (ref 0.0–7.0)
Eosinophils Absolute: 0.3 10*3/uL (ref 0.0–0.5)
HCT: 38.7 % (ref 38.4–49.9)
HEMOGLOBIN: 13.1 g/dL (ref 13.0–17.1)
LYMPH%: 23.3 % (ref 14.0–49.0)
MCH: 30.7 pg (ref 27.2–33.4)
MCHC: 33.9 g/dL (ref 32.0–36.0)
MCV: 90.5 fL (ref 79.3–98.0)
MONO#: 0.4 10*3/uL (ref 0.1–0.9)
MONO%: 6.7 % (ref 0.0–14.0)
NEUT#: 4 10*3/uL (ref 1.5–6.5)
NEUT%: 65.3 % (ref 39.0–75.0)
Platelets: 215 10*3/uL (ref 140–400)
RBC: 4.27 10*6/uL (ref 4.20–5.82)
RDW: 13.8 % (ref 11.0–14.6)
WBC: 6.1 10*3/uL (ref 4.0–10.3)
lymph#: 1.4 10*3/uL (ref 0.9–3.3)

## 2017-02-01 MED ORDER — IOPAMIDOL (ISOVUE-300) INJECTION 61%
100.0000 mL | Freq: Once | INTRAVENOUS | Status: AC | PRN
  Administered 2017-02-01: 100 mL via INTRAVENOUS

## 2017-02-01 MED ORDER — IOPAMIDOL (ISOVUE-300) INJECTION 61%
INTRAVENOUS | Status: AC
  Filled 2017-02-01: qty 100

## 2017-02-02 LAB — PSA: PROSTATE SPECIFIC AG, SERUM: 15.7 ng/mL — AB (ref 0.0–4.0)

## 2017-02-05 ENCOUNTER — Telehealth: Payer: Self-pay | Admitting: Oncology

## 2017-02-05 ENCOUNTER — Ambulatory Visit (HOSPITAL_BASED_OUTPATIENT_CLINIC_OR_DEPARTMENT_OTHER): Payer: Medicare Other | Admitting: Oncology

## 2017-02-05 ENCOUNTER — Telehealth: Payer: Self-pay | Admitting: Pharmacy Technician

## 2017-02-05 VITALS — BP 152/87 | HR 77 | Temp 98.2°F | Resp 20 | Ht 75.0 in | Wt 238.5 lb

## 2017-02-05 DIAGNOSIS — R9721 Rising PSA following treatment for malignant neoplasm of prostate: Secondary | ICD-10-CM | POA: Diagnosis not present

## 2017-02-05 DIAGNOSIS — C7951 Secondary malignant neoplasm of bone: Secondary | ICD-10-CM

## 2017-02-05 DIAGNOSIS — E291 Testicular hypofunction: Secondary | ICD-10-CM

## 2017-02-05 DIAGNOSIS — M899 Disorder of bone, unspecified: Secondary | ICD-10-CM | POA: Diagnosis not present

## 2017-02-05 DIAGNOSIS — C61 Malignant neoplasm of prostate: Secondary | ICD-10-CM

## 2017-02-05 DIAGNOSIS — I10 Essential (primary) hypertension: Secondary | ICD-10-CM | POA: Diagnosis not present

## 2017-02-05 MED ORDER — ENZALUTAMIDE 40 MG PO CAPS: 160.0000 mg | ORAL_CAPSULE | Freq: Every day | ORAL | 0 refills | Status: DC

## 2017-02-05 NOTE — Telephone Encounter (Signed)
Oral Oncology Patient Advocate Encounter  Received notification from [employer] that prior authorization for Gillermina Phy is required.  PA submitted on CoverMyMeds Key [id number] Status is pending  Oral Oncology Clinic will continue to follow.  [name]. Melynda Keller, [facility] [phone] 02/05/2017 11:19 AM

## 2017-02-05 NOTE — Progress Notes (Signed)
Hematology and Oncology Follow Up Visit  [name] [id number] 03-01-1945 71 y.o. 02/05/2017 10:24 AM   Principle Diagnosis: 71 year old gentleman with prostate cancer diagnosed in 2003. He had a Gleason score 4 + 3 equals 7. PSA 6.45.   Prior Therapy:  1. He is status post a prostatectomy and lymph node dissection done on November 05, 2001. He had a Gleason score 4 + 3 equals 7. He had  focal extracapsular extension, both seminal vesicles were involved indicating his stage was T3b N0.  2. The patient received a total of 6480 cGy of radiation therapy in 36 fractions between February and April 2005.  3. The patient had a rise in August of 2011 up to 35 and he was started on Lupron. His PSA nadir down to 4.11. Most recently his PSA was up to 5.91 in December of 2012 with a testosterone level of 35. His staging workup did not reveal any bony metastasis. His his PSA went up to 10 in 04/2011.  4. Casodex was added to Lupron on 05/2011. This was discontinued in January 2016 due to progression of disease. 5.Zytiga 1000 mg daily started on 05/13/2014.  Current therapy: [name] which will be discontinued in December 2018 because of progression of disease.  Under consideration for different salvage therapy.  Interim History: [name] presents today for a followup visit. Since his last visit, he continues to do well.  He continues to take Zytiga without any recent complications.  He denies any excessive edema, fatigue or recent hospitalizations.  He denied any headaches or dizziness. He denied any neurological deficits. He remains active and attends to activities of daily living. He denied any nausea, arthralgias, myalgias or urinary symptoms.  His quality of life and performance status remains excellent.  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    He has not reported any headaches, blurry vision, seizure or syncope. He does not report any fevers, chills or  sweats. Has not reported any chest pain breath. Has not reported any palpitation or leg edema. He does not report any wheezing, cough or hemoptysis. He did not report any nausea or vomiting or abdominal pain. He has not reported any lymphadenopathy or petechiae. Rest of the review of systems unremarkable.   Medications: I have reviewed the patient's current medications.  Current Outpatient Medications  Medication Sig Dispense Refill  . ALPRAZolam (XANAX) 0.5 MG tablet Take 0.5 mg by mouth 3 (three) times daily as needed for sleep.    Marland Kitchen amLODipine (NORVASC) 5 MG tablet amlodipine 5 mg tablet  Take 1 tablet every day by oral route.    . Ascorbic Acid (VITAMIN C) 100 MG CHEW Vitamin C  1 once a day    . aspirin EC 81 MG tablet Take 81 mg by mouth daily.    . Cholecalciferol (VITAMIN D3) 3000 units TABS cholecalciferol (vitamin D3) 1,000 unit tablet  1 tablet orally once a day    . enzalutamide (XTANDI) 40 MG capsule Take 4 capsules (160 mg total) by mouth daily. 120 capsule 0  . Leuprolide Acetate (LUPRON DEPOT IM) Inject into the muscle every 4 (four) months.    . levETIRAcetam (KEPPRA) 500 MG tablet Take 1 tablet (500 mg total) by mouth 2 (two) times daily. (Patient taking differently: Take 500 mg by mouth. Patient takes 1 tablet every 3rd day) 60 tablet 3  . Multiple Vitamins-Minerals (MULTIVITAMIN ADULT EXTRA C PO) multivitamin  1 tablet orally once a  day    . Omega-3 Fatty Acids (FISH OIL) 1000 MG CAPS Fish Oil  2 tablet po daily     No current facility-administered medications for this visit.     Allergies:  Allergies  Allergen Reactions  . Penicillins Rash and Other (See Comments)    Has patient had a PCN reaction causing immediate rash, facial/tongue/throat swelling, SOB or lightheadedness with hypotension: YES Has patient had a PCN reaction causing severe rash involving mucus membranes or skin necrosis: YES Has patient had a PCN reaction that required hospitalization: NO Has  patient had a PCN reaction occurring within the last 10 years: NO     Past Medical History, Surgical history, Social history, and Family History were reviewed and updated.  Physical Exam: Blood pressure (!) 170/93, pulse 77, temperature 98.2 F (36.8 C), temperature source Oral, resp. rate 20, height 6\' 3"  (1.905 m), weight 238 lb 8 oz (108.2 kg), SpO2 100 %.  ECOG: 0 General appearance: Well-appearing gentleman without distress. Head: Normocephalic, without obvious abnormality no oral ulcers or lesions. Neck: no adenopathy no thyroid masses. Lymph nodes: Cervical, supraclavicular, and axillary nodes normal. Heart:regular rate and rhythm, S1, S2 normal, no murmur, click, rub or gallop Lung:chest clear, no wheezing, rales, normal symmetric air entry Abdomin: soft, non-tender, without masses or organomegaly no shifting dullness or ascites. EXT:no erythema, induration, or nodules Skin: No erythema or rashes.  Lab Results: Lab Results  Component Value Date   WBC 6.1 02/01/2017   HGB 13.1 02/01/2017   HCT 38.7 02/01/2017   MCV 90.5 02/01/2017   PLT 215 02/01/2017     Chemistry      Component Value Date/Time   NA 141 02/01/2017 0749   K 3.5 02/01/2017 0749   CL 108 09/17/2015 0536   CL 104 06/11/2012 0817   CO2 26 02/01/2017 0749   BUN 14.5 02/01/2017 0749   CREATININE 0.9 02/01/2017 0749      Component Value Date/Time   CALCIUM 9.2 02/01/2017 0749   ALKPHOS 70 02/01/2017 0749   AST 17 02/01/2017 0749   ALT 14 02/01/2017 0749   BILITOT 0.95 02/01/2017 0749     Results for [name], [name] (MRN [id number]) as of 02/05/2017 10:27  Ref. Range 08/22/2016 08:07 11/21/2016 07:50 02/01/2017 07:49  Prostate Specific Ag, Serum Latest Ref Range: 0.0 - 4.0 ng/mL 4.7 (H) 8.3 (H) 15.7 (H)   EXAM: CT CHEST, ABDOMEN, AND PELVIS WITH CONTRAST  TECHNIQUE: Multidetector CT imaging of the chest, abdomen and pelvis was performed following the standard protocol during  bolus administration of intravenous contrast.  CONTRAST:  190mL ISOVUE-300 IOPAMIDOL (ISOVUE-300) INJECTION 61%  COMPARISON:  Multiple exams, including 04/22/2014  FINDINGS: CT CHEST FINDINGS  Cardiovascular: The ascending thoracic aorta measures 4.4 cm in diameter on image 31/2.  Mild calcification along the mitral valve.  Mild cardiomegaly.  Mediastinum/Nodes: Small type 1 hiatal hernia. No pathologic adenopathy.  Lungs/Pleura: Old granulomatous disease with several calcified right pulmonary nodules. Scarring anteromedially in the right upper lobe.  Musculoskeletal: New sclerotic lesions in the thorax of high suspicion for metastatic prostate cancer. These include multiple rib lesions (such as an index right eighth rib lesion measuring 1.5 cm in long axis on image 120/4) as well as a new sclerotic lesion in the T3 vertebral body measuring approximately 2.5 by 1.7 by 2.2 cm.  Bilateral gynecomastia.  CT ABDOMEN PELVIS FINDINGS  Hepatobiliary: Hypodense liver lesions similar to the prior exam and likely represent cysts although several are technically too small  to characterize. Gallbladder unremarkable. No worrisome new liver lesions.  Pancreas: Unremarkable  Spleen: Unremarkable  Adrenals/Urinary Tract: Simple appearing 5 cm right kidney upper pole cyst, previously 4.7 cm. Several additional tiny hypodense lesions in both kidneys are likely cysts although technically nonspecific due to small size. No urinary tract calculi identified.  Stomach/Bowel: Prominent stool throughout the colon favors constipation. Appendix normal.  Vascular/Lymphatic: Small bilateral inguinal and external iliac lymph nodes are not pathologically enlarged by size criteria.  Reproductive: Prostatectomy.  Other: No supplemental non-categorized findings.  Musculoskeletal: New 1.6 cm sclerotic lesion in the T12 vertebral body along its inferior endplate suspicious for a  new metastatic lesion. 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  IMPRESSION: 1. New sclerotic lesions in the ribs as well as the T3 and T12 vertebra are highly suspicious for prostate metastatic disease. Consider whole-body bone scan. 2. No adenopathy or extraosseous metastatic disease identified. 3. Ascending thoracic aortic aneurysm at 4.4 cm diameter. Recommend annual imaging followup by CTA or MRA. This recommendation follows 2010 ACCF/AHA/AATS/ACR/ASA/SCA/SCAI/SIR/STS/SVM Guidelines for the Diagnosis and Management of Patients with Thoracic Aortic Disease. Circulation. 2010; 121: G500-B704 4. Other imaging findings of potential clinical significance: Mild cardiomegaly. Aortic Atherosclerosis (ICD10-I70.0). Mildly calcified mitral valve. Small type 1 hiatal hernia. Old granulomatous disease. Bilateral gynecomastia. Hepatic and renal cysts. Prominent stool throughout the colon favors constipation.      Impression and Plan:  71 year old gentleman with the following issues:  1. Castration resistant disease and biochemical relapse. Casodex withdrawal between January 2016 and February 2016 did not yield any benefit.   He started [place] on 05/13/2014.complications.  CT scan as well as PSA from 02/01/2017 were reviewed today with the patient and his family.  His PSA is showing a rise currently with a doubling time of less than 6 months.  His CT scan is showing areas of measurable disease predominantly in the bone and his low volume.  The natural course of this disease was reviewed again with the patient extensively.  He understands he has an incurable malignancy but certainly a disease that can be palliated moving forward.  Options of therapy were reviewed today which include different hormonal agent although the success rate could be compromised.  Systemic chemotherapy as well as Trudi Ida would be options.  After discussion today, he elected to proceed with Xtandi.  Complication associated  with this medication were reviewed again.  These include fatigue, tiredness, edema and rarely seizures.  After discussion today, he is agreeable to proceed with this treatment.  2. Androgen deprivation. He is currently receiving Lupron under the care of Dr. Risa Grill.  He will receive his next Lupron in March 2019 at the cancer center based on his wishes.  3. Bony disease.  Bone scan will be updated in the immediate future.  Depending on these results we will discuss whether bone directed therapy is needed for him.  4. 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 He is currently on low-dose aspirin.  No thrombosis noted.  5. Hypertension: His blood pressure is elevated today although it is unclear whether he has to do with anxiety.  He continues to check his blood pressure periodically at home.  6. Subdural hematoma: Status post evacuation in July 2017. Etiology is unclear and certainly exacerbated by warfarin. This has resolved at this time with a repeat CT scan on 02/14/2016.  7. Follow up: in January 2019.   Zola Button, MD 12/11/201810:24  AM  

## 2017-02-05 NOTE — Telephone Encounter (Signed)
Oral Oncology Patient Advocate Encounter  Prior Authorization for Gillermina Phy has been approved.    Effective dates: 02/05/2017 through 02/05/2018  Oral Oncology Clinic will continue to follow.   [name]. Melynda Keller, [place] Patient [name] [phone] 02/05/2017 12:20 PM

## 2017-02-05 NOTE — Telephone Encounter (Signed)
Gave avs and calendar for January 2019 °

## 2017-02-06 ENCOUNTER — Telehealth: Payer: Self-pay | Admitting: Pharmacy Technician

## 2017-02-06 ENCOUNTER — Telehealth: Payer: Self-pay | Admitting: Pharmacist

## 2017-02-06 ENCOUNTER — Other Ambulatory Visit: Payer: Self-pay | Admitting: Pharmacist

## 2017-02-06 MED FILL — XTANDI 40 MG CAPSULE: 40 | 30 days supply | Qty: 120 | Fill #0

## 2017-02-06 NOTE — Telephone Encounter (Signed)
Oral Chemotherapy Pharmacist Encounter  I spoke with patient for overview of new oral chemotherapy medication: Xtandi for the treatment of metastatic castration-resistant prostate cancer in conjunction with Lupron, planned duration until disease progression or unacceptable toxicity. He is changing therapy from [place].  Pt is doing well. Counseled patient on administration, dosing, side effects, monitoring, drug-food interactions, safe handling, storage, and disposal.  Labs from 12/7 assessed, okay for treatment.   Patient will take Xtandi 40mg  tablets, 4 tablets (160mg ) by mouth once daily without regard to food. Xtandi start date: Will start 02/07/17.   Side effects include but not limited to: peripheral edema, GI upset, hypertension, hot flashes, fatigue, falls (keep taking Vitamin D) and arthralgias. Patient has HTN, so he was encouraged to monitor his blood pressure at home and notify the office if becoming elevated.   Noted patient is on levetiracetam, will need to monitor for any seizure-like activity.   Relevant drug interactions with Gillermina Phy noted: may decrease effectiveness of amlopidine and alprazolam. Patient educated and will monitor.   Reviewed with patient importance of keeping a medication schedule and plan for any missed doses.  Mr. Sibley voiced understanding and appreciation. All questions answered.  Patient knows to call the office with questions or concerns.  Oral Oncology Clinic will continue to follow.  Thank you,  Demetrius Charity, PharmD PGY2 Oncology Pharmacy Resident  Pharmacy Phone: [phone] 02/06/2017

## 2017-02-14 ENCOUNTER — Ambulatory Visit (HOSPITAL_COMMUNITY)
Admission: RE | Admit: 2017-02-14 | Discharge: 2017-02-14 | Disposition: A | Discharge status: Home / Self Care | Payer: Medicare Other | Source: Ambulatory Visit | Attending: Oncology | Admitting: Oncology

## 2017-02-14 ENCOUNTER — Encounter (HOSPITAL_COMMUNITY)
Admission: RE | Admit: 2017-02-14 | Discharge: 2017-02-14 | Disposition: A | Discharge status: Home / Self Care | Payer: Medicare Other | Source: Ambulatory Visit | Attending: Oncology | Admitting: Oncology

## 2017-02-14 DIAGNOSIS — C7951 Secondary malignant neoplasm of bone: Secondary | ICD-10-CM | POA: Insufficient documentation

## 2017-02-14 DIAGNOSIS — C61 Malignant neoplasm of prostate: Secondary | ICD-10-CM

## 2017-02-14 MED ORDER — TECHNETIUM TC 99M MEDRONATE IV KIT
21.6000 | PACK | Freq: Once | INTRAVENOUS | Status: AC | PRN
  Administered 2017-02-14: 21.6 via INTRAVENOUS

## 2017-02-28 ENCOUNTER — Other Ambulatory Visit: Payer: Self-pay | Admitting: Oncology

## 2017-03-01 MED FILL — XTANDI 40 MG CAPSULE: 40 | 30 days supply | Qty: 120 | Fill #0

## 2017-03-04 DIAGNOSIS — I10 Essential (primary) hypertension: Secondary | ICD-10-CM

## 2017-03-04 HISTORY — DX: Essential (primary) hypertension: I10

## 2017-03-15 ENCOUNTER — Inpatient Hospital Stay: Payer: Medicare Other | Attending: Oncology

## 2017-03-15 ENCOUNTER — Other Ambulatory Visit: Payer: Self-pay | Admitting: Oncology

## 2017-03-15 DIAGNOSIS — Z9079 Acquired absence of other genital organ(s): Secondary | ICD-10-CM | POA: Insufficient documentation

## 2017-03-15 DIAGNOSIS — I6200 Nontraumatic subdural hemorrhage, unspecified: Secondary | ICD-10-CM | POA: Insufficient documentation

## 2017-03-15 DIAGNOSIS — R5383 Other fatigue: Secondary | ICD-10-CM | POA: Diagnosis not present

## 2017-03-15 DIAGNOSIS — C7951 Secondary malignant neoplasm of bone: Secondary | ICD-10-CM | POA: Insufficient documentation

## 2017-03-15 DIAGNOSIS — Z79899 Other long term (current) drug therapy: Secondary | ICD-10-CM | POA: Insufficient documentation

## 2017-03-15 DIAGNOSIS — Z86711 Personal history of pulmonary embolism: Secondary | ICD-10-CM | POA: Insufficient documentation

## 2017-03-15 DIAGNOSIS — Z7982 Long term (current) use of aspirin: Secondary | ICD-10-CM | POA: Diagnosis not present

## 2017-03-15 DIAGNOSIS — C61 Malignant neoplasm of prostate: Secondary | ICD-10-CM

## 2017-03-15 DIAGNOSIS — Z923 Personal history of irradiation: Secondary | ICD-10-CM | POA: Diagnosis not present

## 2017-03-15 DIAGNOSIS — Z86718 Personal history of other venous thrombosis and embolism: Secondary | ICD-10-CM | POA: Diagnosis not present

## 2017-03-15 DIAGNOSIS — I10 Essential (primary) hypertension: Secondary | ICD-10-CM | POA: Diagnosis not present

## 2017-03-15 LAB — CBC WITH DIFFERENTIAL/PLATELET
BASOS PCT: 1 %
Basophils Absolute: 0 10*3/uL (ref 0.0–0.1)
EOS ABS: 0.3 10*3/uL (ref 0.0–0.5)
EOS PCT: 5 %
HCT: 39.6 % (ref 38.4–49.9)
HEMOGLOBIN: 13.4 g/dL (ref 13.0–17.1)
Lymphocytes Relative: 29 %
Lymphs Abs: 1.8 10*3/uL (ref 0.9–3.3)
MCH: 30.7 pg (ref 27.2–33.4)
MCHC: 33.9 g/dL (ref 32.0–36.0)
MCV: 90.5 fL (ref 79.3–98.0)
Monocytes Absolute: 0.4 10*3/uL (ref 0.1–0.9)
Monocytes Relative: 6 %
NEUTROS PCT: 59 %
Neutro Abs: 3.7 10*3/uL (ref 1.5–6.5)
Platelets: 213 10*3/uL (ref 140–400)
RBC: 4.38 MIL/uL (ref 4.20–5.82)
RDW: 13.4 % (ref 11.0–15.6)
WBC: 6.2 10*3/uL (ref 4.0–10.3)

## 2017-03-15 LAB — COMPREHENSIVE METABOLIC PANEL
ALBUMIN: 3.4 g/dL — AB (ref 3.5–5.0)
ALK PHOS: 62 U/L (ref 40–150)
ALT: 13 U/L (ref 0–55)
ANION GAP: 9 (ref 3–11)
AST: 14 U/L (ref 5–34)
BUN: 20 mg/dL (ref 7–26)
CALCIUM: 9.3 mg/dL (ref 8.4–10.4)
CHLORIDE: 103 mmol/L (ref 98–109)
CO2: 27 mmol/L (ref 22–29)
Creatinine, Ser: 1.09 mg/dL (ref 0.70–1.30)
GFR calc non Af Amer: >60 mL/min (ref >60)
Glucose, Bld: 103 mg/dL (ref 70–140)
Potassium: 4.2 mmol/L (ref 3.5–5.1)
SODIUM: 139 mmol/L (ref 136–145)
Total Bilirubin: 0.6 mg/dL (ref 0.2–1.2)
Total Protein: 7.2 g/dL (ref 6.4–8.3)

## 2017-03-16 LAB — PROSTATE-SPECIFIC AG, SERUM (LABCORP): PROSTATE SPECIFIC AG, SERUM: 9 ng/mL — AB (ref 0.0–4.0)

## 2017-03-19 ENCOUNTER — Other Ambulatory Visit: Payer: Self-pay | Admitting: *Deleted

## 2017-03-19 ENCOUNTER — Telehealth: Payer: Self-pay | Admitting: Oncology

## 2017-03-19 ENCOUNTER — Inpatient Hospital Stay (HOSPITAL_BASED_OUTPATIENT_CLINIC_OR_DEPARTMENT_OTHER): Payer: Medicare Other | Admitting: Oncology

## 2017-03-19 VITALS — BP 148/82 | HR 74 | Temp 98.4°F | Resp 18 | Ht 75.0 in | Wt 237.7 lb

## 2017-03-19 DIAGNOSIS — Z86718 Personal history of other venous thrombosis and embolism: Secondary | ICD-10-CM | POA: Diagnosis not present

## 2017-03-19 DIAGNOSIS — Z7982 Long term (current) use of aspirin: Secondary | ICD-10-CM | POA: Diagnosis not present

## 2017-03-19 DIAGNOSIS — Z86711 Personal history of pulmonary embolism: Secondary | ICD-10-CM

## 2017-03-19 DIAGNOSIS — C7951 Secondary malignant neoplasm of bone: Secondary | ICD-10-CM | POA: Diagnosis not present

## 2017-03-19 DIAGNOSIS — R5383 Other fatigue: Secondary | ICD-10-CM

## 2017-03-19 DIAGNOSIS — Z79899 Other long term (current) drug therapy: Secondary | ICD-10-CM | POA: Diagnosis not present

## 2017-03-19 DIAGNOSIS — C61 Malignant neoplasm of prostate: Secondary | ICD-10-CM

## 2017-03-19 DIAGNOSIS — Z923 Personal history of irradiation: Secondary | ICD-10-CM

## 2017-03-19 DIAGNOSIS — I6200 Nontraumatic subdural hemorrhage, unspecified: Secondary | ICD-10-CM

## 2017-03-19 DIAGNOSIS — I10 Essential (primary) hypertension: Secondary | ICD-10-CM

## 2017-03-19 DIAGNOSIS — Z9079 Acquired absence of other genital organ(s): Secondary | ICD-10-CM

## 2017-03-19 MED ORDER — CALCIUM CARBONATE-VITAMIN D 500-200 MG-UNIT PO TABS: 1.0000 | ORAL_TABLET | Freq: Every day | ORAL | Status: DC

## 2017-03-19 MED ORDER — CALCIUM GLUCONATE 500 MG PO TABS: 1.0000 | ORAL_TABLET | Freq: Every day | ORAL | 3 refills | Status: DC

## 2017-03-19 NOTE — Telephone Encounter (Signed)
Gave avs and calendar for march °

## 2017-03-19 NOTE — Progress Notes (Signed)
Hematology and Oncology Follow Up Visit  [name] [id number] 01-08-1946 72 y.o. 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  He was initially diagnosed in 2003. He had a Gleason score 4 + 3 equals 7. PSA 6.45.   Prior Therapy:  1. He is status post a prostatectomy and lymph node dissection done on November 05, 2001. He had a Gleason score 4 + 3 equals 7. He had  focal extracapsular extension, both seminal vesicles were involved indicating his stage was T3b N0.  2. S/P 6480 cGy of radiation therapy in 36 fractions between February and April 2005.  3.  Lupron was started in 2011.  His PSA nadir down to 4.11. Most recently his PSA was up to 5.91 in December of 2012 with a testosterone level of 35. His staging workup did not reveal any bony metastasis. 4. Casodex was added to Lupron on 05/2011. This was discontinued in January 2016 due to progression of disease. 5. Zytiga 1000 mg daily started on 05/13/2014.  Therapy discontinued in December 2018 because of progression of disease.  Current therapy: Xtandi 160 mg daily started in December 2018.  Interim History: [name] is here for follow-up with his wife.  Since the last visit, he started Abanda and completed about 1 month of therapy.  He denies any complications related to this medication.  He did have grade 1 fatigue but is manageable.  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  He still able to attend to most activities of daily living.  Not any bone pain or pathological fractures.  He denies any back pain or hip discomfort.  He denies any chest wall pain.  He is able to eat and maintain adequate nutrition at this time.  His blood pressure has been within normal range checked periodically and by his primary care physician.  He is no longer taking Keppra at this time without any  seizure activities.   He has not reported any headaches, blurry vision, or syncope. He does not report any fevers, chills or sweats. Has not reported any chest pain breath. Has not reported any palpitation or leg edema. He does not report any wheezing, cough or hemoptysis. He did not report any nausea or vomiting or abdominal pain. He has not reported any lymphadenopathy or petechiae.  He does not report frequency urgency or hesitancy.  He does not report any skin rashes or lesions.  He does not report any heat or cold intolerance.  He does not report any anxiety or depression.  Rest of the review of systems is negative.    Medications: I have reviewed the patient's current medications.  Current Outpatient Medications  Medication Sig Dispense Refill  . ALPRAZolam (XANAX) 0.5 MG tablet Take 0.5 mg by mouth 3 (three) times daily as needed for sleep.    Marland Kitchen amLODipine (NORVASC) 5 MG tablet amlodipine 5 mg tablet  Take 1 tablet every day by oral route.    . Ascorbic Acid (VITAMIN C) 100 MG CHEW Vitamin C  1 once a day    . aspirin EC 81 MG tablet Take 81 mg by mouth daily.    . calcium gluconate 500 MG tablet Take 1 tablet (500 mg total) by mouth daily. 90 tablet 3  . Cholecalciferol (VITAMIN D3) 3000 units TABS cholecalciferol (vitamin D3) 1,000 unit tablet  1 tablet orally once  a day    . enzalutamide (XTANDI) 40 MG capsule Take 4 capsules (160 mg total) by mouth daily. 120 capsule 0  . Leuprolide Acetate (LUPRON DEPOT IM) Inject into the muscle every 4 (four) months.    . levETIRAcetam (KEPPRA) 500 MG tablet Take 1 tablet (500 mg total) by mouth 2 (two) times daily. (Patient taking differently: Take 500 mg by mouth. Patient takes 1 tablet every 3rd day) 60 tablet 3  . Multiple Vitamins-Minerals (MULTIVITAMIN ADULT EXTRA C PO) multivitamin  1 tablet orally once a day    . Omega-3 Fatty Acids (FISH OIL) 1000 MG CAPS Fish Oil  2 tablet po daily    . XTANDI 40 MG capsule TAKE 4 CAPSULES (160 MG  TOTAL) BY MOUTH DAILY. 120 capsule 0  . XTANDI 40 MG capsule TAKE 4 CAPSULES (160 MG TOTAL) BY MOUTH DAILY. 120 capsule 0   No current facility-administered medications for this visit.     Allergies:  Allergies  Allergen Reactions  . Penicillins Rash and Other (See Comments)    Has patient had a PCN reaction causing immediate rash, facial/tongue/throat swelling, SOB or lightheadedness with hypotension: YES Has patient had a PCN reaction causing severe rash involving mucus membranes or skin necrosis: YES Has patient had a PCN reaction that required hospitalization: NO Has patient had a PCN reaction occurring within the last 10 years: NO     Past Medical History, Surgical history, Social history, and Family History remains unchanged.   Physical Exam: Blood pressure (!) 148/82, pulse 74, temperature 98.4 F (36.9 C), temperature source Axillary, resp. rate 18, height 6\' 3"  (1.905 m), weight 237 lb 11.2 oz (107.8 kg), SpO2 100 %.  ECOG: 0  General appearance: Alert, awake gentleman without distress. Head: Normocephalic, without obvious abnormality  Oropharynx: No oral ulcers or thrush. Eyes: No scleral icterus.  Pupils are equal and round and reactive to light. Lymph nodes: Cervical, supraclavicular, and axillary nodes normal. Heart:regular rate and rhythm, S1, S2 normal, no murmur, click, rub or gallop Lung: clear, no wheezing, rales, normal symmetric air entry.  No dullness to percussion. Abdomin: Good bowel sounds in all 4 quadrants.  No rebound or guarding. Musculoskeletal: No joint deformity or effusion.   Skin: No erythema or rashes. Neurological: No deficits.  Lab Results: Lab Results  Component Value Date   WBC 6.2 03/15/2017   HGB 13.4 03/15/2017   HCT 39.6 03/15/2017   MCV 90.5 03/15/2017   PLT 213 03/15/2017     Chemistry      Component Value Date/Time   NA 139 03/15/2017 0740   NA 141 02/01/2017 0749   K 4.2 03/15/2017 0740   K 3.5 02/01/2017 0749   CL 103  03/15/2017 0740   CL 104 06/11/2012 0817   CO2 27 03/15/2017 0740   CO2 26 02/01/2017 0749   BUN 20 03/15/2017 0740   BUN 14.5 02/01/2017 0749   CREATININE 1.09 03/15/2017 0740   CREATININE 0.9 02/01/2017 0749      Component Value Date/Time   CALCIUM 9.3 03/15/2017 0740   CALCIUM 9.2 02/01/2017 0749   ALKPHOS 62 03/15/2017 0740   ALKPHOS 70 02/01/2017 0749   AST 14 03/15/2017 0740   AST 17 02/01/2017 0749   ALT 13 03/15/2017 0740   ALT 14 02/01/2017 0749   BILITOT 0.6 03/15/2017 0740   BILITOT 0.95 02/01/2017 0749     Results for Stotts, ROLLEN SELDERS (MRN [id number]) as of 03/19/2017 09:51  Ref. Range 02/01/2017 07:49 03/15/2017  07:40  Prostate Specific Ag, Serum Latest Ref Range: 0.0 - 4.0 ng/mL 15.7 (H) 9.0 (H)   EXAM: NUCLEAR MEDICINE WHOLE BODY BONE SCAN  TECHNIQUE: Whole body anterior and posterior images were obtained approximately 3 hours after intravenous injection of radiopharmaceutical.  RADIOPHARMACEUTICALS:  21.6 mCi Technetium-53m MDP IV  COMPARISON:  06/01/2013 bone scan. 02/01/2017 chest, less abdomen and pelvis CT.  FINDINGS: Abnormal focal areas of increased activity are identified within the left parietal bone, T3 vertebral body, at least 3 posterior right ribs, the inferior scapula versus posterior mid left rib, and right iliac bone -compatible with metastatic disease.  All of these lesions are new when compared to 06/01/2013 except for an anterior mid right rib lesion.  IMPRESSION: Bony metastatic disease as described, new since 06/01/2013.    Impression and Plan:  72 year old gentleman with the following issues:  1. Castration resistant prostate cancer with metastatic disease to the bone.  He was initially diagnosed in 2003 with multiple therapies in the past outlined above.  He developed progression of disease in December 2018 on [place].  Bone scan obtained on February 14, 2017 was personally reviewed and discussed with the patient.   He appears to have few areas of bony metastasis at this time.  He is or asymptomatic currently.  He took Xtandi for 4 weeks and tolerated this medication well.  His PSA showed an excellent response currently down to 9.0 from 15.7.  Risks and benefits of continuing this medication was reviewed today.  Alternative therapies were also discussed as well as the natural course of this disease.  He is agreeable to continue this medication given his reasonable response.  2. Androgen deprivation. He is currently receiving Lupron under the care of Dr. Risa Grill.  He will receive his next Lupron on March 5 and every 4 months after that.  Complication associated with this medication was discussed today include hot flashes and weight gain.  3. Bony disease.  He does have metastatic prostate cancer to the bone with risk of possible fractures in the future.  We discussed bone health today which include calcium and vitamin D supplements as well as the role of Xgeva.  Complication associated with this medication was also reviewed and we will consider adding this medication after dental clearance.  4. 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  He is off anticoagulation.  5. Hypertension: His blood pressure is back to normal at this time.  He will continue to follow with his primary care physician regarding this issue.  6. Subdural hematoma: Status post evacuation in July 2017.This has resolved at this time with a repeat CT scan on 02/14/2016.  No recent seizure activities or seizure medication at this time.  7. Follow up: in March 2019.  25  minutes was spent with the patient face-to-face today.  More than 50% of time was dedicated to patient counseling, education and answering his questions.    Zola Button, MD 1/22/20199:45 AM

## 2017-03-22 ENCOUNTER — Other Ambulatory Visit: Payer: Self-pay | Admitting: Oncology

## 2017-03-25 MED FILL — XTANDI 40 MG CAPSULE: 40 | 30 days supply | Qty: 120 | Fill #0

## 2017-04-11 NOTE — Telephone Encounter (Signed)
No note - error

## 2017-04-15 ENCOUNTER — Other Ambulatory Visit: Payer: Self-pay | Admitting: Oncology

## 2017-04-22 MED FILL — XTANDI 40 MG CAPSULE: 40 | 30 days supply | Qty: 120 | Fill #0

## 2017-04-29 ENCOUNTER — Inpatient Hospital Stay: Payer: Medicare Other | Attending: Oncology

## 2017-04-29 DIAGNOSIS — Z79818 Long term (current) use of other agents affecting estrogen receptors and estrogen levels: Secondary | ICD-10-CM | POA: Insufficient documentation

## 2017-04-29 DIAGNOSIS — Z86718 Personal history of other venous thrombosis and embolism: Secondary | ICD-10-CM | POA: Diagnosis not present

## 2017-04-29 DIAGNOSIS — Z79899 Other long term (current) drug therapy: Secondary | ICD-10-CM | POA: Diagnosis not present

## 2017-04-29 DIAGNOSIS — Z7982 Long term (current) use of aspirin: Secondary | ICD-10-CM | POA: Diagnosis not present

## 2017-04-29 DIAGNOSIS — Z9079 Acquired absence of other genital organ(s): Secondary | ICD-10-CM | POA: Insufficient documentation

## 2017-04-29 DIAGNOSIS — Z86711 Personal history of pulmonary embolism: Secondary | ICD-10-CM | POA: Insufficient documentation

## 2017-04-29 DIAGNOSIS — C61 Malignant neoplasm of prostate: Secondary | ICD-10-CM | POA: Insufficient documentation

## 2017-04-29 DIAGNOSIS — Z192 Hormone resistant malignancy status: Secondary | ICD-10-CM | POA: Diagnosis not present

## 2017-04-29 DIAGNOSIS — Z923 Personal history of irradiation: Secondary | ICD-10-CM | POA: Insufficient documentation

## 2017-04-29 DIAGNOSIS — C7951 Secondary malignant neoplasm of bone: Secondary | ICD-10-CM | POA: Insufficient documentation

## 2017-04-29 LAB — CBC WITH DIFFERENTIAL (CANCER CENTER ONLY)
Basophils Absolute: 0 10*3/uL (ref 0.0–0.1)
Basophils Relative: 0 %
Eosinophils Absolute: 0.2 10*3/uL (ref 0.0–0.5)
Eosinophils Relative: 3 %
HEMATOCRIT: 40.7 % (ref 38.4–49.9)
HEMOGLOBIN: 13.6 g/dL (ref 13.0–17.1)
LYMPHS ABS: 2.1 10*3/uL (ref 0.9–3.3)
LYMPHS PCT: 26 %
MCH: 30.8 pg (ref 27.2–33.4)
MCHC: 33.4 g/dL (ref 32.0–36.0)
MCV: 92.3 fL (ref 79.3–98.0)
Monocytes Absolute: 0.4 10*3/uL (ref 0.1–0.9)
Monocytes Relative: 5 %
NEUTROS ABS: 5.1 10*3/uL (ref 1.5–6.5)
NEUTROS PCT: 66 %
Platelet Count: 202 10*3/uL (ref 140–400)
RBC: 4.41 MIL/uL (ref 4.20–5.82)
RDW: 13.2 % (ref 11.0–14.6)
WBC Count: 7.8 10*3/uL (ref 4.0–10.3)

## 2017-04-29 LAB — CMP (CANCER CENTER ONLY)
ALBUMIN: 3.7 g/dL (ref 3.5–5.0)
ALK PHOS: 59 U/L (ref 40–150)
ALT: 14 U/L (ref 0–55)
ANION GAP: 11 (ref 3–11)
AST: 16 U/L (ref 5–34)
BILIRUBIN TOTAL: 0.5 mg/dL (ref 0.2–1.2)
BUN: 22 mg/dL (ref 7–26)
CALCIUM: 10.1 mg/dL (ref 8.4–10.4)
CO2: 26 mmol/L (ref 22–29)
Chloride: 102 mmol/L (ref 98–109)
Creatinine: 1.1 mg/dL (ref 0.70–1.30)
GFR, Est AFR Am: >60 mL/min (ref >60)
GLUCOSE: 108 mg/dL (ref 70–140)
POTASSIUM: 4.8 mmol/L (ref 3.5–5.1)
Sodium: 139 mmol/L (ref 136–145)
TOTAL PROTEIN: 7.6 g/dL (ref 6.4–8.3)

## 2017-04-30 ENCOUNTER — Telehealth: Payer: Self-pay | Admitting: Oncology

## 2017-04-30 ENCOUNTER — Inpatient Hospital Stay (HOSPITAL_BASED_OUTPATIENT_CLINIC_OR_DEPARTMENT_OTHER): Payer: Medicare Other | Admitting: Oncology

## 2017-04-30 ENCOUNTER — Inpatient Hospital Stay: Payer: Medicare Other

## 2017-04-30 VITALS — BP 147/80 | HR 72 | Temp 97.8°F | Resp 18 | Ht 75.0 in | Wt 240.4 lb

## 2017-04-30 DIAGNOSIS — Z9079 Acquired absence of other genital organ(s): Secondary | ICD-10-CM | POA: Diagnosis not present

## 2017-04-30 DIAGNOSIS — Z86718 Personal history of other venous thrombosis and embolism: Secondary | ICD-10-CM

## 2017-04-30 DIAGNOSIS — Z923 Personal history of irradiation: Secondary | ICD-10-CM | POA: Diagnosis not present

## 2017-04-30 DIAGNOSIS — Z79818 Long term (current) use of other agents affecting estrogen receptors and estrogen levels: Secondary | ICD-10-CM

## 2017-04-30 DIAGNOSIS — Z7982 Long term (current) use of aspirin: Secondary | ICD-10-CM | POA: Diagnosis not present

## 2017-04-30 DIAGNOSIS — Z79899 Other long term (current) drug therapy: Secondary | ICD-10-CM | POA: Diagnosis not present

## 2017-04-30 DIAGNOSIS — C61 Malignant neoplasm of prostate: Secondary | ICD-10-CM

## 2017-04-30 DIAGNOSIS — C7951 Secondary malignant neoplasm of bone: Secondary | ICD-10-CM | POA: Diagnosis not present

## 2017-04-30 DIAGNOSIS — Z192 Hormone resistant malignancy status: Secondary | ICD-10-CM | POA: Diagnosis not present

## 2017-04-30 DIAGNOSIS — Z86711 Personal history of pulmonary embolism: Secondary | ICD-10-CM

## 2017-04-30 LAB — PROSTATE-SPECIFIC AG, SERUM (LABCORP): Prostate Specific Ag, Serum: 11.4 ng/mL — ABNORMAL HIGH (ref 0.0–4.0)

## 2017-04-30 MED ORDER — LEUPROLIDE ACETATE (4 MONTH) 30 MG IM KIT
30.0000 mg | PACK | Freq: Once | INTRAMUSCULAR | Status: AC
  Administered 2017-04-30: 30 mg via INTRAMUSCULAR
  Filled 2017-04-30: qty 30

## 2017-04-30 NOTE — Telephone Encounter (Signed)
Scheduled appt per 3/5 los - Gave patient AVS and calender per los.  

## 2017-04-30 NOTE — Progress Notes (Signed)
Hematology and Oncology Follow Up Visit  [name] [id number] [birth year] 72 y.o. 04/30/2017 12:54 PM   Principle Diagnosis: 72 year old man with castration resistant prostate cancer. He was initially diagnosed in 2003 with Gleason score 4 + 3 equals 7. PSA 6.45. He has limited asymptomatic disease to the bone.  Prior Therapy:  1. He is status post a prostatectomy and lymph node dissection done on November 05, 2001.   2. S/P 6480 cGy of radiation therapy in 36 fractions between February and April 2005.   3. He developed advanced disease and was treated with Lupron in 2011.  His PSA nadir down to 4.11. PSA was up to 5.91 in December of 2012 with a testosterone level of 35. His staging workup did not reveal any bony metastasis.  4. Casodex was added to Lupron on 05/2011. This was discontinued in January 2016 due to progression of disease.  5. Zytiga 1000 mg daily started on 05/13/2014.  Therapy discontinued in December 2018 because of progression of disease. Bone scan of the time showed limited metastatic disease.  Current therapy: Xtandi 160 mg daily started in December 2018.  Interim History: [name] presents today for a follow-up. He reports no major changes since the last visit. He continues to take Any without any noticeable complications. He does report some mild fatigue but still able to perform activities of daily living. He denied any nausea, vomiting or change in his appetite. He denied any joint pain or arthralgias. He denied any lower extremity edema. He still ambulate without any falls or syncope.  He denied any genitourinary complaints including hematuria or dysuria. His quality of life and performance status is unaffected at this time.  He has not reported any headaches, blurry vision, or syncope. He does not report any fevers, chills or sweats. Appetite remain reasonable with his weight slightly increased. Has not reported any palpitation or leg edema. He does not  report any wheezing, cough or hemoptysis. He did not report any nausea or vomiting or abdominal pain. He has not reported any lymphadenopathy or petechiae.  He does not report frequency urgency or hesitancy.  He does not report any skin rashes or lesions.  He does not report any heat or cold intolerance.  He does not report any anxiety or depression. Does not report any joint deformity or arthralgias. Rest of the review of systems is negative.    Medications: I have reviewed the patient's current medications.  Current Outpatient Medications  Medication Sig Dispense Refill  . ALPRAZolam (XANAX) 0.5 MG tablet Take 0.5 mg by mouth 3 (three) times daily as needed for sleep.    Marland Kitchen amLODipine (NORVASC) 5 MG tablet amlodipine 5 mg tablet  Take 1 tablet every day by oral route.    . Ascorbic Acid (VITAMIN C) 100 MG CHEW Vitamin C  1 once a day    . aspirin EC 81 MG tablet Take 81 mg by mouth daily.    . calcium-vitamin D (OSCAL 500/200 D-3) 500-200 MG-UNIT tablet Take 1 tablet by mouth daily with breakfast. 1 tablet   . Cholecalciferol (VITAMIN D3) 3000 units TABS cholecalciferol (vitamin D3) 1,000 unit tablet  1 tablet orally once a day    . Leuprolide Acetate (LUPRON DEPOT IM) Inject into the muscle every 4 (four) months.    . levETIRAcetam (KEPPRA) 500 MG tablet Take 1 tablet (500 mg total) by mouth 2 (two) times daily. (Patient taking differently: Take 500 mg by mouth. Patient takes 1 tablet every 3rd  day) 60 tablet 3  . Multiple Vitamins-Minerals (MULTIVITAMIN ADULT EXTRA C PO) multivitamin  1 tablet orally once a day    . Omega-3 Fatty Acids (FISH OIL) 1000 MG CAPS Fish Oil  2 tablet po daily    . XTANDI 40 MG capsule TAKE 4 CAPSULES BY MOUTH DAILY 120 capsule 0   No current facility-administered medications for this visit.     Allergies:  Allergies  Allergen Reactions  . Penicillins Rash and Other (See Comments)    Has patient had a PCN reaction causing immediate rash,  facial/tongue/throat swelling, SOB or lightheadedness with hypotension: YES Has patient had a PCN reaction causing severe rash involving mucus membranes or skin necrosis: YES Has patient had a PCN reaction that required hospitalization: NO Has patient had a PCN reaction occurring within the last 10 years: NO     Past Medical History, Surgical history, Social history, and Family History remains unchanged.   Physical Exam:  ECOG: 0 Blood pressure (!) 147/80, pulse 72, temperature 97.8 F (36.6 C), temperature source Oral, resp. rate 18, height 6\' 3"  (1.905 m), weight 240 lb 6.4 oz (109 kg), SpO2 99 %.   General appearance: Well-appearing gentleman appeared comfortable. Head: Atraumatic without any abnormalities. Oropharynx: Mucous membranes are moist and pink. Eyes: Conjunctivae is clear. Pupils are equal and round and reactive. No icterus. Lymph nodes: Cervical, supraclavicular, and axillary nodes normal. Heart: Regular rate and rhythm without any murmurs rubs or gallops. Lung: Clear to auscultation without any rhonchi wheezes. No dullness to percussion. Abdomin: Soft, nontender without any rebound or guarding. Good bowel sounds. Musculoskeletal: Full range of motion without any pain or limitation. Skin: No skin rashes or petechiae. Neurological: No motor, sensory deficits.  Lab Results: Lab Results  Component Value Date   WBC 7.8 04/29/2017   HGB 13.4 03/15/2017   HCT 40.7 04/29/2017   MCV 92.3 04/29/2017   PLT 202 04/29/2017     Chemistry      Component Value Date/Time   NA 139 04/29/2017 1050   NA 141 02/01/2017 0749   K 4.8 04/29/2017 1050   K 3.5 02/01/2017 0749   CL 102 04/29/2017 1050   CL 104 06/11/2012 0817   CO2 26 04/29/2017 1050   CO2 26 02/01/2017 0749   BUN 22 04/29/2017 1050   BUN 14.5 02/01/2017 0749   CREATININE 1.10 04/29/2017 1050   CREATININE 0.9 02/01/2017 0749      Component Value Date/Time   CALCIUM 10.1 04/29/2017 1050   CALCIUM 9.2  02/01/2017 0749   ALKPHOS 59 04/29/2017 1050   ALKPHOS 70 02/01/2017 0749   AST 16 04/29/2017 1050   AST 17 02/01/2017 0749   ALT 14 04/29/2017 1050   ALT 14 02/01/2017 0749   BILITOT 0.5 04/29/2017 1050   BILITOT 0.95 02/01/2017 0749     Results for Havlin, AMIR FICK (MRN [id number]) as of 04/30/2017 12:46  Ref. Range 02/01/2017 07:49 03/15/2017 07:40 04/29/2017 10:50  Prostate Specific Ag, Serum Latest Ref Range: 0.0 - 4.0 ng/mL 15.7 (H) 9.0 (H) 11.4 (H)       Impression and Plan:  72 year old gentleman with the following issues:  1. Castration resistant prostate cancer with limited disease to the bone.  He is status post therapies outlined above.   He is currently on Xtandi which is tolerated without any major complications.  His PSA did decline after the first month of therapy from 15.7 to 9.0. On 04/29/2017 his PSA is 11.4.  The natural course  of this disease was reviewed again including alternative options at this time. The option would include continuing a standing versus systemic chemotherapy. The rationale for using chemotherapy was reviewed today including risks and benefits.  After discussion today, we have elected to continue with the same dose and schedule and follow his PSA closely. His PSA starts to rise rapidly, we will restage him and consider systemic chemotherapy at the time.  2. Androgen deprivation. He will receive Lupron 30 mg every 4 months starting on 04/30/2017. Long-term combination associated with this treatment was reviewed again.  3. Bony disease.  He would be a good candidate for Xgeva after obtaining dental clearance to prevent skeletal-related events.  4. 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  5. Hypertension: His blood pressure close to normal range at this time. We will continue to monitor periodically.  6. Subdural hematoma: Diagnosed in July 2017. No evidence of recurrence at this  time.  7. Follow up: in 4-6 weeks.  25  minutes was spent with the patient face-to-face today.  More than 50% of time was dedicated to patient counseling, education and answering his questions.    Zola Button, MD 3/5/201912:54 PM

## 2017-05-01 ENCOUNTER — Other Ambulatory Visit: Payer: Self-pay | Admitting: *Deleted

## 2017-05-13 ENCOUNTER — Other Ambulatory Visit: Payer: Self-pay | Admitting: Oncology

## 2017-05-16 MED FILL — XTANDI 40 MG CAPSULE: 40 | 30 days supply | Qty: 120 | Fill #0

## 2017-06-10 ENCOUNTER — Other Ambulatory Visit: Payer: Self-pay | Admitting: Oncology

## 2017-06-13 MED FILL — XTANDI 40 MG CAPSULE: 40 | 30 days supply | Qty: 120 | Fill #0

## 2017-06-18 ENCOUNTER — Inpatient Hospital Stay: Payer: Medicare Other | Attending: Oncology

## 2017-06-18 DIAGNOSIS — C7951 Secondary malignant neoplasm of bone: Secondary | ICD-10-CM | POA: Diagnosis not present

## 2017-06-18 DIAGNOSIS — Z86718 Personal history of other venous thrombosis and embolism: Secondary | ICD-10-CM | POA: Insufficient documentation

## 2017-06-18 DIAGNOSIS — Z192 Hormone resistant malignancy status: Secondary | ICD-10-CM | POA: Diagnosis not present

## 2017-06-18 DIAGNOSIS — I10 Essential (primary) hypertension: Secondary | ICD-10-CM | POA: Diagnosis not present

## 2017-06-18 DIAGNOSIS — Z8669 Personal history of other diseases of the nervous system and sense organs: Secondary | ICD-10-CM | POA: Insufficient documentation

## 2017-06-18 DIAGNOSIS — Z7982 Long term (current) use of aspirin: Secondary | ICD-10-CM | POA: Insufficient documentation

## 2017-06-18 DIAGNOSIS — C61 Malignant neoplasm of prostate: Secondary | ICD-10-CM | POA: Insufficient documentation

## 2017-06-18 DIAGNOSIS — Z86711 Personal history of pulmonary embolism: Secondary | ICD-10-CM | POA: Insufficient documentation

## 2017-06-18 DIAGNOSIS — Z923 Personal history of irradiation: Secondary | ICD-10-CM | POA: Diagnosis not present

## 2017-06-18 DIAGNOSIS — Z79899 Other long term (current) drug therapy: Secondary | ICD-10-CM | POA: Insufficient documentation

## 2017-06-18 LAB — CBC WITH DIFFERENTIAL (CANCER CENTER ONLY)
BASOS PCT: 0 %
Basophils Absolute: 0 10*3/uL (ref 0.0–0.1)
Eosinophils Absolute: 0.3 10*3/uL (ref 0.0–0.5)
Eosinophils Relative: 4 %
HEMATOCRIT: 39.7 % (ref 38.4–49.9)
HEMOGLOBIN: 13.2 g/dL (ref 13.0–17.1)
LYMPHS ABS: 2.3 10*3/uL (ref 0.9–3.3)
Lymphocytes Relative: 33 %
MCH: 31 pg (ref 27.2–33.4)
MCHC: 33.2 g/dL (ref 32.0–36.0)
MCV: 93.2 fL (ref 79.3–98.0)
MONOS PCT: 6 %
Monocytes Absolute: 0.4 10*3/uL (ref 0.1–0.9)
NEUTROS ABS: 3.8 10*3/uL (ref 1.5–6.5)
NEUTROS PCT: 57 %
Platelet Count: 192 10*3/uL (ref 140–400)
RBC: 4.26 MIL/uL (ref 4.20–5.82)
RDW: 13.1 % (ref 11.0–14.6)
WBC: 6.8 10*3/uL (ref 4.0–10.3)

## 2017-06-18 LAB — CMP (CANCER CENTER ONLY)
ALK PHOS: 62 U/L (ref 40–150)
ALT: 12 U/L (ref 0–55)
AST: 16 U/L (ref 5–34)
Albumin: 3.5 g/dL (ref 3.5–5.0)
Anion gap: 9 (ref 3–11)
BILIRUBIN TOTAL: 0.4 mg/dL (ref 0.2–1.2)
BUN: 20 mg/dL (ref 7–26)
CALCIUM: 9.5 mg/dL (ref 8.4–10.4)
CO2: 25 mmol/L (ref 22–29)
CREATININE: 1.12 mg/dL (ref 0.70–1.30)
Chloride: 106 mmol/L (ref 98–109)
GFR, Est AFR Am: >60 mL/min (ref >60)
GFR, Estimated: >60 mL/min (ref >60)
Glucose, Bld: 95 mg/dL (ref 70–140)
Potassium: 4.3 mmol/L (ref 3.5–5.1)
Sodium: 140 mmol/L (ref 136–145)
TOTAL PROTEIN: 7 g/dL (ref 6.4–8.3)

## 2017-06-19 ENCOUNTER — Telehealth: Payer: Self-pay | Admitting: Oncology

## 2017-06-19 ENCOUNTER — Inpatient Hospital Stay: Payer: Medicare Other | Admitting: Oncology

## 2017-06-19 VITALS — BP 147/65 | HR 70 | Temp 97.8°F | Resp 19 | Ht 75.0 in | Wt 242.0 lb

## 2017-06-19 DIAGNOSIS — C7951 Secondary malignant neoplasm of bone: Secondary | ICD-10-CM | POA: Diagnosis not present

## 2017-06-19 DIAGNOSIS — C61 Malignant neoplasm of prostate: Secondary | ICD-10-CM

## 2017-06-19 DIAGNOSIS — Z7982 Long term (current) use of aspirin: Secondary | ICD-10-CM | POA: Diagnosis not present

## 2017-06-19 DIAGNOSIS — Z8669 Personal history of other diseases of the nervous system and sense organs: Secondary | ICD-10-CM | POA: Diagnosis not present

## 2017-06-19 DIAGNOSIS — I10 Essential (primary) hypertension: Secondary | ICD-10-CM | POA: Diagnosis not present

## 2017-06-19 DIAGNOSIS — Z79899 Other long term (current) drug therapy: Secondary | ICD-10-CM | POA: Diagnosis not present

## 2017-06-19 DIAGNOSIS — Z192 Hormone resistant malignancy status: Secondary | ICD-10-CM | POA: Diagnosis not present

## 2017-06-19 DIAGNOSIS — Z86711 Personal history of pulmonary embolism: Secondary | ICD-10-CM | POA: Diagnosis not present

## 2017-06-19 DIAGNOSIS — Z923 Personal history of irradiation: Secondary | ICD-10-CM

## 2017-06-19 DIAGNOSIS — Z86718 Personal history of other venous thrombosis and embolism: Secondary | ICD-10-CM | POA: Diagnosis not present

## 2017-06-19 LAB — PROSTATE-SPECIFIC AG, SERUM (LABCORP): Prostate Specific Ag, Serum: 10.9 ng/mL — ABNORMAL HIGH (ref 0.0–4.0)

## 2017-06-19 NOTE — Progress Notes (Signed)
Hematology and Oncology Follow Up Visit  [name] [id number] 1945-09-10 72 y.o. 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 He was initially diagnosed in 2003 with Gleason score 4 + 3 equals 7. PSA 6.45.  Prior Therapy:  1. He is status post a prostatectomy and lymph node dissection done on November 05, 2001.   2. S/P 6480 cGy of radiation therapy in 36 fractions between February and April 2005.   3. He developed advanced disease and was treated with Lupron in 2011.  His PSA nadir down to 4.11. PSA was up to 5.91 in December of 2012 with a testosterone level of 35. His staging workup did not reveal any bony metastasis.  4. Casodex was added to Lupron on 05/2011. This was discontinued in January 2016 due to progression of disease.  5. Zytiga 1000 mg daily started on 05/13/2014.  Therapy discontinued in December 2018 because of progression of disease. Bone scan of the time showed limited metastatic disease.  Current therapy:   Xtandi 160 mg daily started in December 2018.  Lupron 30 mg every 4 months with his next injection scheduled for July 2019.  Interim History: [name] presents today for a follow-up.  Since her last visit, he reports no major changes in his health.  He continues to tolerate Xtandi without any major complications.  He denies any nausea or edema.  He denies any bone pain or pathological fractures.  He denies any hematuria or dysuria.  His quality of life and performance status remained unchanged.  He reports some mild fatigue but does not interfere with his ability to function.  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  He has not reported any headaches, blurry vision, or syncope. He does not report any fevers, chills or sweats.  Weight slightly increased.  He denies any chest pain, palpitation or orthopnea.  He does not report any  wheezing, cough or hemoptysis. He did not report any nausea or vomiting or abdominal pain. He has not reported any lymphadenopathy or petechiae.  He does not report frequency urgency or hesitancy.  He does not report any skin rashes or lesions.  He does not report any heat or cold intolerance.  He does not report any anxiety or depression.  Does not report any bone pain or pathological fractures.  Rest of the review of systems is negative.    Medications: I have reviewed the patient's current medications.  Current Outpatient Medications  Medication Sig Dispense Refill  . ALPRAZolam (XANAX) 0.5 MG tablet Take 0.5 mg by mouth 3 (three) times daily as needed for sleep.    Marland Kitchen amLODipine (NORVASC) 5 MG tablet amlodipine 5 mg tablet  Take 1 tablet every day by oral route.    . Ascorbic Acid (VITAMIN C) 100 MG CHEW Vitamin C  1 once a day    . aspirin EC 81 MG tablet Take 81 mg by mouth daily.    . calcium-vitamin D (OSCAL 500/200 D-3) 500-200 MG-UNIT tablet Take 1 tablet by mouth daily with breakfast. 1 tablet   . Cholecalciferol (VITAMIN D3) 3000 units TABS cholecalciferol (vitamin D3) 1,000 unit tablet  1 tablet orally once a day    . Leuprolide Acetate (LUPRON DEPOT IM) Inject into the muscle every 4 (four) months.    . Multiple Vitamins-Minerals (MULTIVITAMIN ADULT EXTRA C PO) multivitamin  1 tablet orally once a day    .  Omega-3 Fatty Acids (FISH OIL) 1000 MG CAPS Fish Oil  2 tablet po daily    . XTANDI 40 MG capsule TAKE 4 CAPSULES (160MG ) BY MOUTH DAILY 120 capsule 0   No current facility-administered medications for this visit.     Allergies:  Allergies  Allergen Reactions  . Penicillins Rash and Other (See Comments)    Has patient had a PCN reaction causing immediate rash, facial/tongue/throat swelling, SOB or lightheadedness with hypotension: YES Has patient had a PCN reaction causing severe rash involving mucus membranes or skin necrosis: YES Has patient had a PCN reaction that  required hospitalization: NO Has patient had a PCN reaction occurring within the last 10 years: NO     Past Medical History, Surgical history, Social history, and Family History remains unchanged.   Physical Exam:  ECOG: 0 Blood pressure (!) 147/65, pulse 70, temperature 97.8 F (36.6 C), temperature source Oral, resp. rate 19, height 6\' 3"  (1.905 m), weight 242 lb (109.8 kg), SpO2 100 %.   General appearance: Alert, awake gentleman without distress. Head: Normocephalic without abnormalities. Oropharynx: Without any oral thrush or ulcers. Eyes: Pupils are equal and round reactive to light. Lymph nodes:  cervical, axillary or supraclavicular lymph nodes are normal. Heart: Regular rate and rhythm without any murmurs. Lung: Clear in all lung fields without any rhonchi or wheezes. Abdomin: Soft, nondistended without any rebound or guarding. Musculoskeletal: No joint deformity or effusion.  No clubbing or cyanosis. Skin: No ecchymosis or petechiae. Neurological: No motor or sensory deficits.  Intact deep tendon reflexes.  Lab Results: Lab Results  Component Value Date   WBC 6.8 06/18/2017   HGB 13.2 06/18/2017   HCT 39.7 06/18/2017   MCV 93.2 06/18/2017   PLT 192 06/18/2017     Chemistry      Component Value Date/Time   NA 140 06/18/2017 0749   NA 141 02/01/2017 0749   K 4.3 06/18/2017 0749   K 3.5 02/01/2017 0749   CL 106 06/18/2017 0749   CL 104 06/11/2012 0817   CO2 25 06/18/2017 0749   CO2 26 02/01/2017 0749   BUN 20 06/18/2017 0749   BUN 14.5 02/01/2017 0749   CREATININE 1.12 06/18/2017 0749   CREATININE 0.9 02/01/2017 0749      Component Value Date/Time   CALCIUM 9.5 06/18/2017 0749   CALCIUM 9.2 02/01/2017 0749   ALKPHOS 62 06/18/2017 0749   ALKPHOS 70 02/01/2017 0749   AST 16 06/18/2017 0749   AST 17 02/01/2017 0749   ALT 12 06/18/2017 0749   ALT 14 02/01/2017 0749   BILITOT 0.4 06/18/2017 0749   BILITOT 0.95 02/01/2017 0749       Results for  [name] (MRN [id number]) as of 06/19/2017 11:54  Ref. Range 04/29/2017 10:50 06/18/2017 07:49  Prostate Specific Ag, Serum Latest Ref Range: 0.0 - 4.0 ng/mL 11.4 (H) 10.9 (H)      Impression and Plan:  72 year old gentleman with the following issues:  1. Castration-resistant prostate cancer with asymptomatic disease to the bone.  He is currently on Xtandi since December 2018 without any major complications.  His PSA shows a reasonable response to therapy and down close to 30% since December 2019.  He is tolerating this medication reasonably well without any complications.  Risks and benefits of continuing this treatment at this time was discussed and is agreeable to continue.  Different salvage therapy may be needed if his PSA starts to rise.  2. Androgen deprivation.  Long-term complication  associated with Lupron was reviewed today and he is agreeable to continue.  He will receive Lupron at 30 mg in July 2019.  3.  Skeletal related events prevention: He will be a good candidate for Xgeva given his metastatic disease to the bone.  We will address that after obtaining dental clearance.  4. 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  He is off anticoagulation.  5. Hypertension: His blood pressure continues to be within normal range.  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  7. Follow up: in July 2019.  25  minutes was spent with the patient face-to-face today.  More than 50% of time was dedicated to patient counseling, education and coordination of his care.   Zola Button, MD 4/24/201912:19 PM

## 2017-06-19 NOTE — Telephone Encounter (Signed)
Scheduled appt per 4/24 los - Gave pt AVS and calender per los

## 2017-07-04 ENCOUNTER — Other Ambulatory Visit: Payer: Self-pay | Admitting: Oncology

## 2017-07-08 MED FILL — XTANDI 40 MG CAPSULE: 40 | 30 days supply | Qty: 120 | Fill #0

## 2017-08-01 ENCOUNTER — Other Ambulatory Visit: Payer: Self-pay | Admitting: Oncology

## 2017-08-06 MED FILL — XTANDI 40 MG CAPSULE: 40 | 30 days supply | Qty: 120 | Fill #0

## 2017-08-15 ENCOUNTER — Other Ambulatory Visit: Payer: Self-pay | Admitting: Internal Medicine

## 2017-08-27 ENCOUNTER — Other Ambulatory Visit: Payer: Self-pay | Admitting: Oncology

## 2017-08-30 ENCOUNTER — Ambulatory Visit: Payer: Medicare Other

## 2017-09-03 ENCOUNTER — Inpatient Hospital Stay: Payer: Medicare Other | Attending: Oncology

## 2017-09-03 DIAGNOSIS — Z7982 Long term (current) use of aspirin: Secondary | ICD-10-CM | POA: Diagnosis not present

## 2017-09-03 DIAGNOSIS — C61 Malignant neoplasm of prostate: Secondary | ICD-10-CM | POA: Diagnosis not present

## 2017-09-03 DIAGNOSIS — I10 Essential (primary) hypertension: Secondary | ICD-10-CM | POA: Diagnosis not present

## 2017-09-03 DIAGNOSIS — Z192 Hormone resistant malignancy status: Secondary | ICD-10-CM | POA: Diagnosis not present

## 2017-09-03 DIAGNOSIS — C7951 Secondary malignant neoplasm of bone: Secondary | ICD-10-CM | POA: Insufficient documentation

## 2017-09-03 DIAGNOSIS — Z923 Personal history of irradiation: Secondary | ICD-10-CM | POA: Diagnosis not present

## 2017-09-03 DIAGNOSIS — Z79818 Long term (current) use of other agents affecting estrogen receptors and estrogen levels: Secondary | ICD-10-CM | POA: Insufficient documentation

## 2017-09-03 DIAGNOSIS — Z79899 Other long term (current) drug therapy: Secondary | ICD-10-CM | POA: Diagnosis not present

## 2017-09-03 LAB — CMP (CANCER CENTER ONLY)
ALT: 13 U/L (ref 0–44)
AST: 15 U/L (ref 15–41)
Albumin: 3.6 g/dL (ref 3.5–5.0)
Alkaline Phosphatase: 62 U/L (ref 38–126)
Anion gap: 7 (ref 5–15)
BUN: 20 mg/dL (ref 8–23)
CO2: 28 mmol/L (ref 22–32)
Calcium: 9.7 mg/dL (ref 8.9–10.3)
Chloride: 104 mmol/L (ref 98–111)
Creatinine: 1.06 mg/dL (ref 0.61–1.24)
GFR, Est AFR Am: >60 mL/min (ref >60)
GFR, Estimated: >60 mL/min (ref >60)
Glucose, Bld: 110 mg/dL — ABNORMAL HIGH (ref 70–99)
Potassium: 5 mmol/L (ref 3.5–5.1)
Sodium: 139 mmol/L (ref 135–145)
Total Bilirubin: 0.4 mg/dL (ref 0.3–1.2)
Total Protein: 7.1 g/dL (ref 6.5–8.1)

## 2017-09-03 LAB — CBC WITH DIFFERENTIAL (CANCER CENTER ONLY)
Basophils Absolute: 0 10*3/uL (ref 0.0–0.1)
Basophils Relative: 1 %
Eosinophils Absolute: 0.2 10*3/uL (ref 0.0–0.5)
Eosinophils Relative: 3 %
HCT: 39.6 % (ref 38.4–49.9)
Hemoglobin: 13.4 g/dL (ref 13.0–17.1)
Lymphocytes Relative: 30 %
Lymphs Abs: 2.1 10*3/uL (ref 0.9–3.3)
MCH: 31.4 pg (ref 27.2–33.4)
MCHC: 33.8 g/dL (ref 32.0–36.0)
MCV: 92.8 fL (ref 79.3–98.0)
Monocytes Absolute: 0.5 10*3/uL (ref 0.1–0.9)
Monocytes Relative: 7 %
Neutro Abs: 4.2 10*3/uL (ref 1.5–6.5)
Neutrophils Relative %: 59 %
Platelet Count: 211 10*3/uL (ref 140–400)
RBC: 4.27 MIL/uL (ref 4.20–5.82)
RDW: 13.4 % (ref 11.0–14.6)
WBC Count: 7 10*3/uL (ref 4.0–10.3)

## 2017-09-04 ENCOUNTER — Encounter: Payer: Self-pay | Admitting: Oncology

## 2017-09-04 ENCOUNTER — Inpatient Hospital Stay: Payer: Medicare Other

## 2017-09-04 ENCOUNTER — Telehealth: Payer: Self-pay

## 2017-09-04 ENCOUNTER — Inpatient Hospital Stay: Payer: Medicare Other | Admitting: Oncology

## 2017-09-04 VITALS — BP 148/79 | HR 71 | Temp 97.8°F | Resp 18 | Ht 75.0 in | Wt 243.3 lb

## 2017-09-04 DIAGNOSIS — Z192 Hormone resistant malignancy status: Secondary | ICD-10-CM

## 2017-09-04 DIAGNOSIS — Z79818 Long term (current) use of other agents affecting estrogen receptors and estrogen levels: Secondary | ICD-10-CM | POA: Diagnosis not present

## 2017-09-04 DIAGNOSIS — Z923 Personal history of irradiation: Secondary | ICD-10-CM

## 2017-09-04 DIAGNOSIS — C7951 Secondary malignant neoplasm of bone: Secondary | ICD-10-CM | POA: Diagnosis not present

## 2017-09-04 DIAGNOSIS — C61 Malignant neoplasm of prostate: Secondary | ICD-10-CM | POA: Diagnosis not present

## 2017-09-04 DIAGNOSIS — Z7982 Long term (current) use of aspirin: Secondary | ICD-10-CM

## 2017-09-04 DIAGNOSIS — I10 Essential (primary) hypertension: Secondary | ICD-10-CM

## 2017-09-04 DIAGNOSIS — Z79899 Other long term (current) drug therapy: Secondary | ICD-10-CM

## 2017-09-04 LAB — PROSTATE-SPECIFIC AG, SERUM (LABCORP): PROSTATE SPECIFIC AG, SERUM: 12.7 ng/mL — AB (ref 0.0–4.0)

## 2017-09-04 MED ORDER — LEUPROLIDE ACETATE (4 MONTH) 30 MG IM KIT
30.0000 mg | PACK | Freq: Once | INTRAMUSCULAR | Status: AC
  Administered 2017-09-04: 30 mg via INTRAMUSCULAR
  Filled 2017-09-04: qty 30

## 2017-09-04 MED ORDER — LEUPROLIDE ACETATE (3 MONTH) 22.5 MG IM KIT
PACK | INTRAMUSCULAR | Status: AC
  Filled 2017-09-04: qty 22.5

## 2017-09-04 MED FILL — XTANDI 40 MG CAPSULE: 40 | 30 days supply | Qty: 120 | Fill #0

## 2017-09-04 NOTE — Progress Notes (Signed)
Hematology and Oncology Follow Up Visit  [name] [id number] 1945/12/01 72 y.o. 09/04/2017 9:34 AM   Principle Diagnosis: 72 year-old man with castration-resistant prostate cancer discovered in December 2018.  He has limited disease to the bone after initial diagnosis with prostate cancer in 2003 with Gleason score 4 + 3 equals 7 and  PSA 6.45.  Prior Therapy:  1. He is status post a prostatectomy and lymph node dissection done on November 05, 2001.   2. S/P 6480 cGy of radiation therapy in 36 fractions between February and April 2005.   3. He developed advanced disease and was treated with Lupron in 2011.  His PSA nadir down to 4.11. PSA was up to 5.91 in December of 2012 with a testosterone level of 35. His staging workup did not reveal any bony metastasis.  4. Casodex was added to Lupron on 05/2011. This was discontinued in January 2016 due to progression of disease.  5. Zytiga 1000 mg daily started on 05/13/2014.  Therapy discontinued in December 2018 because of progression of disease. Bone scan of the time showed limited metastatic disease.  Current therapy:   Xtandi 160 mg daily started in December 2018.  Lupron 30 mg every 4 months.   Interim History: [name] is here for a follow-up.  Since the last visit, he reports no major changes in his health or new complications.  He continues to take Xtandi without any recent hospitalizations or illnesses.  He denies any new side effects including excessive fatigue or tiredness.  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  His performance status and activity level is unchanged and his quality of life is not disrupted.  He denies any bone pain or pathological fractures.  He has not reported any headaches, blurry vision, or syncope.  He denies any alteration in mental status or confusion.  He does not report any fevers, chills or sweats.  His appetite and weight is increased.  He denies any chest  pain, palpitation or orthopnea.  He does not report any wheezing, cough or hemoptysis. He did not report any nausea or vomiting or abdominal pain.  He denies any changes in his bowel habits.  He has not reported any lymphadenopathy or petechiae.  He does not report frequency urgency or hesitancy.  He does not report any skin rashes or lesions.  He does not report any anxiety or depression.  He reports no bleeding or thrombosis history.  Rest of the review of systems is negative.    Medications: I have reviewed the patient's current medications.  Current Outpatient Medications  Medication Sig Dispense Refill  . ALPRAZolam (XANAX) 0.5 MG tablet Take 0.5 mg by mouth 3 (three) times daily as needed for sleep.    Marland Kitchen amLODipine (NORVASC) 5 MG tablet amlodipine 5 mg tablet  Take 1 tablet every day by oral route.    . Ascorbic Acid (VITAMIN C) 100 MG CHEW Vitamin C  1 once a day    . aspirin EC 81 MG tablet Take 81 mg by mouth daily.    . calcium-vitamin D (OSCAL 500/200 D-3) 500-200 MG-UNIT tablet Take 1 tablet by mouth daily with breakfast. 1 tablet   . Cholecalciferol (VITAMIN D3) 3000 units TABS cholecalciferol (vitamin D3) 1,000 unit tablet  1 tablet orally once a day    . Leuprolide Acetate (LUPRON DEPOT IM) Inject into the muscle every 4 (four) months.    . Multiple Vitamins-Minerals (MULTIVITAMIN ADULT EXTRA  C PO) multivitamin  1 tablet orally once a day    . Omega-3 Fatty Acids (FISH OIL) 1000 MG CAPS Fish Oil  2 tablet po daily    . XTANDI 40 MG capsule TAKE 4 CAPSULES (160MG ) BY MOUTH DAILY 120 capsule 0   No current facility-administered medications for this visit.     Allergies:  Allergies  Allergen Reactions  . Penicillins Rash and Other (See Comments)    Has patient had a PCN reaction causing immediate rash, facial/tongue/throat swelling, SOB or lightheadedness with hypotension: YES Has patient had a PCN reaction causing severe rash involving mucus membranes or skin necrosis:  YES Has patient had a PCN reaction that required hospitalization: NO Has patient had a PCN reaction occurring within the last 10 years: NO     Past Medical History, Surgical history, Social history, and Family History remains unchanged.   Physical Exam:  ECOG: 0 Blood pressure (!) 148/79, pulse 71, temperature 97.8 F (36.6 C), temperature source Oral, resp. rate 18, height 6\' 3"  (1.905 m), weight 243 lb 4.8 oz (110.4 kg), SpO2 100 %.   General appearance: Well-appearing gentleman without distress. Head: Atraumatic without abnormalities. Oropharynx: No oral ulcers or thrush. Eyes: Sclera anicteric. Lymph nodes: No lymphadenopathy noted in the cervical, axillary or supraclavicular lymph nodes  Heart: Regular rate and rhythm with S1 and S2.  No murmurs or gallops. Lung: Clear without any rhonchi, wheezes or dullness to percussion. Abdomin: Soft, no rebound or guarding.  No shifting dullness or ascites. Musculoskeletal: No clubbing or cyanosis.  Skin: No skin rashes or lesions.  Skin is moist. Neurological: No motor or sensory deficits.  Lab Results: Lab Results  Component Value Date   WBC 7.0 09/03/2017   HGB 13.4 09/03/2017   HCT 39.6 09/03/2017   MCV 92.8 09/03/2017   PLT 211 09/03/2017     Chemistry      Component Value Date/Time   NA 139 09/03/2017 0811   NA 141 02/01/2017 0749   K 5.0 09/03/2017 0811   K 3.5 02/01/2017 0749   CL 104 09/03/2017 0811   CL 104 06/11/2012 0817   CO2 28 09/03/2017 0811   CO2 26 02/01/2017 0749   BUN 20 09/03/2017 0811   BUN 14.5 02/01/2017 0749   CREATININE 1.06 09/03/2017 0811   CREATININE 0.9 02/01/2017 0749      Component Value Date/Time   CALCIUM 9.7 09/03/2017 0811   CALCIUM 9.2 02/01/2017 0749   ALKPHOS 62 09/03/2017 0811   ALKPHOS 70 02/01/2017 0749   AST 15 09/03/2017 0811   AST 17 02/01/2017 0749   ALT 13 09/03/2017 0811   ALT 14 02/01/2017 0749   BILITOT 0.4 09/03/2017 0811   BILITOT 0.95 02/01/2017 0749        Results for Smeltz, DAXSON REFFETT (MRN [id number]) as of 09/04/2017 08:10  Ref. Range 06/18/2017 07:49 09/03/2017 08:11  Prostate Specific Ag, Serum Latest Ref Range: 0.0 - 4.0 ng/mL 10.9 (H) 12.7 (H)     Impression and Plan:  72 year old gentleman with the following issues:  1. Castration-resistant prostate cancer with limited disease to the bone documented in December 2018.  He remains on Xtandi without any major complications.  His PSA has been relatively stable with slight increase in July 2019.  Options of therapy were reviewed today which include continuing Xtandi versus switching to different agents.  Given the excellent quality of life he is enduring and the limited change in his PSA, I have elected to keep the  same dose and schedule.  We will check his PSA in 4 months.  2. Androgen deprivation.  He remains on Lupron without complications.  He will receive Lupron today and repeated in 4 months.  3.  Skeletal related events prevention: [name] will be considered after obtaining dental clearance.  We will continue to address this issue with him in the future.  4. Hypertension: Changes in his blood pressure noted today.  5. Follow up: in July 2019.  15  minutes was spent with the patient face-to-face today.  More than 50% of time was dedicated to patient counseling, education and discussing the natural course of his disease and treatment options.   Zola Button, MD 7/10/20199:34 AM

## 2017-09-04 NOTE — Telephone Encounter (Signed)
Printed avs and calender of upcoming appointment. Per 7/10 los. Removed 1 injection per RN okayed for 11/5 because [facility] requested it one tobe given on 11/7.

## 2017-09-04 NOTE — Patient Instructions (Signed)

## 2017-09-25 ENCOUNTER — Other Ambulatory Visit: Payer: Self-pay | Admitting: Oncology

## 2017-09-30 MED FILL — XTANDI 40 MG CAPSULE: 40 | 30 days supply | Qty: 120 | Fill #0

## 2017-10-21 ENCOUNTER — Other Ambulatory Visit: Payer: Self-pay | Admitting: Oncology

## 2017-10-23 MED FILL — XTANDI 40 MG CAPSULE: 40 | 30 days supply | Qty: 120 | Fill #0

## 2017-11-13 ENCOUNTER — Other Ambulatory Visit: Payer: Self-pay | Admitting: Oncology

## 2017-11-21 MED FILL — XTANDI 40 MG CAPSULE: 40 | 30 days supply | Qty: 120 | Fill #0

## 2017-12-12 ENCOUNTER — Other Ambulatory Visit: Payer: Self-pay | Admitting: Oncology

## 2017-12-20 MED FILL — XTANDI 40 MG CAPSULE: 40 | 30 days supply | Qty: 120 | Fill #0

## 2017-12-31 ENCOUNTER — Inpatient Hospital Stay: Payer: Medicare Other | Attending: Oncology

## 2017-12-31 ENCOUNTER — Ambulatory Visit: Payer: Medicare Other

## 2017-12-31 DIAGNOSIS — Z7982 Long term (current) use of aspirin: Secondary | ICD-10-CM | POA: Diagnosis not present

## 2017-12-31 DIAGNOSIS — Z79818 Long term (current) use of other agents affecting estrogen receptors and estrogen levels: Secondary | ICD-10-CM | POA: Insufficient documentation

## 2017-12-31 DIAGNOSIS — C61 Malignant neoplasm of prostate: Secondary | ICD-10-CM | POA: Diagnosis not present

## 2017-12-31 DIAGNOSIS — Z192 Hormone resistant malignancy status: Secondary | ICD-10-CM | POA: Diagnosis not present

## 2017-12-31 DIAGNOSIS — Z79899 Other long term (current) drug therapy: Secondary | ICD-10-CM | POA: Insufficient documentation

## 2017-12-31 DIAGNOSIS — R5383 Other fatigue: Secondary | ICD-10-CM | POA: Diagnosis not present

## 2017-12-31 DIAGNOSIS — Z923 Personal history of irradiation: Secondary | ICD-10-CM | POA: Insufficient documentation

## 2017-12-31 DIAGNOSIS — C7951 Secondary malignant neoplasm of bone: Secondary | ICD-10-CM | POA: Diagnosis not present

## 2017-12-31 LAB — CMP (CANCER CENTER ONLY)
ALBUMIN: 3.5 g/dL (ref 3.5–5.0)
ALT: 10 U/L (ref 0–44)
ANION GAP: 8 (ref 5–15)
AST: 18 U/L (ref 15–41)
Alkaline Phosphatase: 67 U/L (ref 38–126)
BUN: 20 mg/dL (ref 8–23)
CO2: 28 mmol/L (ref 22–32)
Calcium: 9.5 mg/dL (ref 8.9–10.3)
Chloride: 104 mmol/L (ref 98–111)
Creatinine: 1.08 mg/dL (ref 0.61–1.24)
GFR, Est AFR Am: >60 mL/min (ref >60)
GFR, Estimated: >60 mL/min (ref >60)
GLUCOSE: 99 mg/dL (ref 70–99)
POTASSIUM: 4.7 mmol/L (ref 3.5–5.1)
SODIUM: 140 mmol/L (ref 135–145)
Total Bilirubin: 0.4 mg/dL (ref 0.3–1.2)
Total Protein: 7.2 g/dL (ref 6.5–8.1)

## 2017-12-31 LAB — CBC WITH DIFFERENTIAL (CANCER CENTER ONLY)
Abs Immature Granulocytes: 0.03 10*3/uL (ref 0.00–0.07)
BASOS ABS: 0 10*3/uL (ref 0.0–0.1)
Basophils Relative: 1 %
EOS ABS: 0.2 10*3/uL (ref 0.0–0.5)
EOS PCT: 3 %
HCT: 42.1 % (ref 39.0–52.0)
Hemoglobin: 13.6 g/dL (ref 13.0–17.0)
Immature Granulocytes: 1 %
LYMPHS PCT: 31 %
Lymphs Abs: 2 10*3/uL (ref 0.7–4.0)
MCH: 30.4 pg (ref 26.0–34.0)
MCHC: 32.3 g/dL (ref 30.0–36.0)
MCV: 94.2 fL (ref 80.0–100.0)
Monocytes Absolute: 0.5 10*3/uL (ref 0.1–1.0)
Monocytes Relative: 7 %
NRBC: 0 % (ref 0.0–0.2)
Neutro Abs: 3.8 10*3/uL (ref 1.7–7.7)
Neutrophils Relative %: 57 %
Platelet Count: 206 10*3/uL (ref 150–400)
RBC: 4.47 MIL/uL (ref 4.22–5.81)
RDW: 12.2 % (ref 11.5–15.5)
WBC: 6.6 10*3/uL (ref 4.0–10.5)

## 2018-01-01 LAB — PROSTATE-SPECIFIC AG, SERUM (LABCORP): Prostate Specific Ag, Serum: 16.4 ng/mL — ABNORMAL HIGH (ref 0.0–4.0)

## 2018-01-02 ENCOUNTER — Inpatient Hospital Stay (HOSPITAL_BASED_OUTPATIENT_CLINIC_OR_DEPARTMENT_OTHER): Payer: Medicare Other | Admitting: Oncology

## 2018-01-02 ENCOUNTER — Other Ambulatory Visit: Payer: Medicare Other

## 2018-01-02 ENCOUNTER — Telehealth: Payer: Self-pay

## 2018-01-02 ENCOUNTER — Inpatient Hospital Stay: Payer: Medicare Other

## 2018-01-02 VITALS — BP 130/81 | HR 68 | Temp 97.6°F | Resp 17 | Ht 75.0 in | Wt 234.9 lb

## 2018-01-02 DIAGNOSIS — Z192 Hormone resistant malignancy status: Secondary | ICD-10-CM

## 2018-01-02 DIAGNOSIS — Z7982 Long term (current) use of aspirin: Secondary | ICD-10-CM

## 2018-01-02 DIAGNOSIS — Z79818 Long term (current) use of other agents affecting estrogen receptors and estrogen levels: Secondary | ICD-10-CM | POA: Diagnosis not present

## 2018-01-02 DIAGNOSIS — C61 Malignant neoplasm of prostate: Secondary | ICD-10-CM

## 2018-01-02 DIAGNOSIS — R5383 Other fatigue: Secondary | ICD-10-CM

## 2018-01-02 DIAGNOSIS — Z79899 Other long term (current) drug therapy: Secondary | ICD-10-CM

## 2018-01-02 DIAGNOSIS — C7951 Secondary malignant neoplasm of bone: Secondary | ICD-10-CM

## 2018-01-02 DIAGNOSIS — Z923 Personal history of irradiation: Secondary | ICD-10-CM

## 2018-01-02 MED ORDER — LEUPROLIDE ACETATE (4 MONTH) 30 MG IM KIT
30.0000 mg | PACK | Freq: Once | INTRAMUSCULAR | Status: AC
  Administered 2018-01-02: 30 mg via INTRAMUSCULAR
  Filled 2018-01-02: qty 30

## 2018-01-02 MED ORDER — LEUPROLIDE ACETATE (3 MONTH) 22.5 MG IM KIT
PACK | INTRAMUSCULAR | Status: AC
  Filled 2018-01-02: qty 22.5

## 2018-01-02 NOTE — Progress Notes (Signed)
Hematology and Oncology Follow Up Visit  [name] [id number] [birth year] 72 y.o. 01/02/2018 9:08 AM   Principle Diagnosis: 72 year-old man with prostate cancer diagnosed in 2003 with Gleason score 4 + 3 equals 7 and  PSA 6.45.he developed castration-resistant disease and December 2018 with bone involvement.  Prior Therapy:  1. He is status post a prostatectomy and lymph node dissection done on November 05, 2001.   2. S/P 6480 cGy of radiation therapy in 36 fractions between February and April 2005.   3. He developed advanced disease and was treated with Lupron in 2011.  His PSA nadir down to 4.11. PSA was up to 5.91 in December of 2012 with a testosterone level of 35. His staging workup did not reveal any bony metastasis.  4. Casodex was added to Lupron on 05/2011. This was discontinued in January 2016 due to progression of disease.  5. Zytiga 1000 mg daily started on 05/13/2014.  Therapy discontinued in December 2018 because of progression of disease. Bone scan of the time showed limited metastatic disease.  Current therapy:   Xtandi 160 mg daily started in December 2018.  Lupron 30 mg every 4 months.   Interim History: [name] returns today for a repeat evaluation.  Since the last visit, he continues to take [facility] without any major complications.  He tolerated therapy without any new side effects.  His blood pressure has been under excellent control and does report some mild fatigue.  He denies any edema or pathological fractures.  He denies any bone pain or decline in his energy.  His quality of life remains unchanged.  He has not reported any headaches, blurry vision, or syncope.  He denies any dizziness or confusion.  He does not report any fevers, chills or sweats. He denies any chest pain, palpitation or orthopnea.  He does not report any wheezing, cough or hemoptysis. He did not report any nausea or vomiting or abdominal pain.  He denies any constipation or diarrhea.   He has not reported any lymphadenopathy or petechiae.  He does not report frequency urgency or hesitancy.  He does not report any skin rashes or lesions.  He does not report any mood changes.  He reports no thrombosis or bleeding issues.  Rest of the review of systems is negative.    Medications: I have reviewed the patient's current medications.  Current Outpatient Medications  Medication Sig Dispense Refill  . ALPRAZolam (XANAX) 0.5 MG tablet Take 0.5 mg by mouth 3 (three) times daily as needed for sleep.    Marland Kitchen amLODipine (NORVASC) 5 MG tablet amlodipine 5 mg tablet  Take 1 tablet every day by oral route.    . Ascorbic Acid (VITAMIN C) 100 MG CHEW Vitamin C  1 once a day    . aspirin EC 81 MG tablet Take 81 mg by mouth daily.    . calcium-vitamin D (OSCAL 500/200 D-3) 500-200 MG-UNIT tablet Take 1 tablet by mouth daily with breakfast. 1 tablet   . Cholecalciferol (VITAMIN D3) 3000 units TABS cholecalciferol (vitamin D3) 1,000 unit tablet  1 tablet orally once a day    . Leuprolide Acetate (LUPRON DEPOT IM) Inject into the muscle every 4 (four) months.    . Multiple Vitamins-Minerals (MULTIVITAMIN ADULT EXTRA C PO) multivitamin  1 tablet orally once a day    . Omega-3 Fatty Acids (FISH OIL) 1000 MG CAPS Fish Oil  2 tablet po daily    . XTANDI 40 MG capsule TAKE 4  CAPSULES (160MG ) BY MOUTH DAILY 120 capsule 0   No current facility-administered medications for this visit.     Allergies:  Allergies  Allergen Reactions  . Penicillins Rash and Other (See Comments)    Has patient had a PCN reaction causing immediate rash, facial/tongue/throat swelling, SOB or lightheadedness with hypotension: YES Has patient had a PCN reaction causing severe rash involving mucus membranes or skin necrosis: YES Has patient had a PCN reaction that required hospitalization: NO Has patient had a PCN reaction occurring within the last 10 years: NO     Past Medical History, Surgical history, Social history,  and Family History remains unchanged.   Physical Exam: Blood pressure 130/81, pulse 68, temperature 97.6 F (36.4 C), temperature source Oral, resp. rate 17, height 6\' 3"  (1.905 m), weight 234 lb 14.4 oz (106.5 kg), SpO2 100 %.   ECOG: 0   General appearance: Comfortable appearing without any discomfort Head: Normocephalic without any trauma Oropharynx: Mucous membranes are moist and pink without any thrush or ulcers. Eyes: Pupils are equal and round reactive to light. Lymph nodes: No cervical, supraclavicular, inguinal or axillary lymphadenopathy.   Heart:regular rate and rhythm.  S1 and S2 without leg edema. Lung: Clear without any rhonchi or wheezes.  No dullness to percussion. Abdomin: Soft, nontender, nondistended with good bowel sounds.  No hepatosplenomegaly. Musculoskeletal: No joint deformity or effusion.  Full range of motion noted. Neurological: No deficits noted on motor, sensory and deep tendon reflex exam. Skin: No petechial rash or dryness.  Appeared moist.    Lab Results: Lab Results  Component Value Date   WBC 6.6 12/31/2017   HGB 13.6 12/31/2017   HCT 42.1 12/31/2017   MCV 94.2 12/31/2017   PLT 206 12/31/2017     Chemistry      Component Value Date/Time   NA 140 12/31/2017 0754   NA 141 02/01/2017 0749   K 4.7 12/31/2017 0754   K 3.5 02/01/2017 0749   CL 104 12/31/2017 0754   CL 104 06/11/2012 0817   CO2 28 12/31/2017 0754   CO2 26 02/01/2017 0749   BUN 20 12/31/2017 0754   BUN 14.5 02/01/2017 0749   CREATININE 1.08 12/31/2017 0754   CREATININE 0.9 02/01/2017 0749      Component Value Date/Time   CALCIUM 9.5 12/31/2017 0754   CALCIUM 9.2 02/01/2017 0749   ALKPHOS 67 12/31/2017 0754   ALKPHOS 70 02/01/2017 0749   AST 18 12/31/2017 0754   AST 17 02/01/2017 0749   ALT 10 12/31/2017 0754   ALT 14 02/01/2017 0749   BILITOT 0.4 12/31/2017 0754   BILITOT 0.95 02/01/2017 0749        Results for Schlauch, AMARIYON MAYNES (MRN [id number]) as of  01/02/2018 08:54  Ref. Range 09/03/2017 08:11 12/31/2017 07:54  Prostate Specific Ag, Serum Latest Ref Range: 0.0 - 4.0 ng/mL 12.7 (H) 16.4 (H)     Impression and Plan:  72 year old gentleman with the following issues:  1. Castration-resistant prostate cancer documented in December 2018 with limited disease to the bone.    He is currently on Xtandi and he has tolerated therapy very well.  His PSA is showing slow rise in the interim.  Risks and benefits of continuing this therapy versus alternative therapy was discussed today.  Complication associated with other therapies including Xofigo, systemic chemotherapy among others were reviewed.  After discussion today, I have elected to proceed with Xtandi for the time being and repeat the bone scan in 4 weeks.  Depending on these results we will proceed with continuing Xtandi alone versus adding Xofigo.  He has expressed interest against using chemotherapy unless needed to at this time.  2. Androgen deprivation.  He remains on Lupron which he has tolerated very well.  This will be repeated every 4 months.  3.  Skeletal related events prevention: I recommended calcium and vitamin D supplements for the time being.  We will assess the need for Xgeva after his next bone scan..  4. Hypertension: His blood pressure continues to be within normal range at this time.  5. Follow up: in 1 months to follow his progress.  25  minutes was spent with the patient face-to-face today.  More than 50% of time was dedicated to reviewing his disease status, treatment options and coordinating plan of care.    Zola Button, MD 11/7/20199:08 AM

## 2018-01-02 NOTE — Patient Instructions (Signed)

## 2018-01-02 NOTE — Telephone Encounter (Signed)
Printed avs and calender of upcoming appointment. Per 11/7 los 

## 2018-01-06 ENCOUNTER — Encounter: Payer: Self-pay | Admitting: Pharmacist

## 2018-01-10 ENCOUNTER — Other Ambulatory Visit: Payer: Self-pay | Admitting: Oncology

## 2018-01-14 MED FILL — XTANDI 40 MG CAPSULE: 40 | 30 days supply | Qty: 120 | Fill #0

## 2018-02-05 ENCOUNTER — Inpatient Hospital Stay: Payer: Medicare Other | Attending: Oncology

## 2018-02-05 ENCOUNTER — Encounter (HOSPITAL_COMMUNITY)
Admission: RE | Admit: 2018-02-05 | Discharge: 2018-02-05 | Disposition: A | Discharge status: Home / Self Care | Payer: Medicare Other | Source: Ambulatory Visit | Attending: Oncology | Admitting: Oncology

## 2018-02-05 DIAGNOSIS — I10 Essential (primary) hypertension: Secondary | ICD-10-CM | POA: Diagnosis not present

## 2018-02-05 DIAGNOSIS — Z79899 Other long term (current) drug therapy: Secondary | ICD-10-CM | POA: Diagnosis not present

## 2018-02-05 DIAGNOSIS — Z7982 Long term (current) use of aspirin: Secondary | ICD-10-CM | POA: Insufficient documentation

## 2018-02-05 DIAGNOSIS — Z923 Personal history of irradiation: Secondary | ICD-10-CM | POA: Insufficient documentation

## 2018-02-05 DIAGNOSIS — C61 Malignant neoplasm of prostate: Secondary | ICD-10-CM

## 2018-02-05 DIAGNOSIS — C7951 Secondary malignant neoplasm of bone: Secondary | ICD-10-CM | POA: Diagnosis not present

## 2018-02-05 LAB — CBC WITH DIFFERENTIAL (CANCER CENTER ONLY)
Abs Immature Granulocytes: 0.02 10*3/uL (ref 0.00–0.07)
BASOS PCT: 0 %
Basophils Absolute: 0 10*3/uL (ref 0.0–0.1)
EOS ABS: 0.2 10*3/uL (ref 0.0–0.5)
EOS PCT: 3 %
HEMATOCRIT: 38.7 % — AB (ref 39.0–52.0)
Hemoglobin: 12.7 g/dL — ABNORMAL LOW (ref 13.0–17.0)
Immature Granulocytes: 0 %
LYMPHS ABS: 1.8 10*3/uL (ref 0.7–4.0)
Lymphocytes Relative: 28 %
MCH: 30.8 pg (ref 26.0–34.0)
MCHC: 32.8 g/dL (ref 30.0–36.0)
MCV: 93.7 fL (ref 80.0–100.0)
MONOS PCT: 6 %
Monocytes Absolute: 0.4 10*3/uL (ref 0.1–1.0)
Neutro Abs: 3.8 10*3/uL (ref 1.7–7.7)
Neutrophils Relative %: 63 %
Platelet Count: 176 10*3/uL (ref 150–400)
RBC: 4.13 MIL/uL — AB (ref 4.22–5.81)
RDW: 12.4 % (ref 11.5–15.5)
WBC Count: 6.2 10*3/uL (ref 4.0–10.5)
nRBC: 0 % (ref 0.0–0.2)

## 2018-02-05 LAB — CMP (CANCER CENTER ONLY)
ALBUMIN: 3.3 g/dL — AB (ref 3.5–5.0)
ALT: 9 U/L (ref 0–44)
ANION GAP: 10 (ref 5–15)
AST: 13 U/L — AB (ref 15–41)
Alkaline Phosphatase: 64 U/L (ref 38–126)
BUN: 17 mg/dL (ref 8–23)
CHLORIDE: 106 mmol/L (ref 98–111)
CO2: 26 mmol/L (ref 22–32)
CREATININE: 1.02 mg/dL (ref 0.61–1.24)
Calcium: 9 mg/dL (ref 8.9–10.3)
GFR, Est AFR Am: >60 mL/min (ref >60)
GFR, Estimated: >60 mL/min (ref >60)
GLUCOSE: 118 mg/dL — AB (ref 70–99)
Potassium: 4.1 mmol/L (ref 3.5–5.1)
SODIUM: 142 mmol/L (ref 135–145)
Total Bilirubin: 0.5 mg/dL (ref 0.3–1.2)
Total Protein: 6.7 g/dL (ref 6.5–8.1)

## 2018-02-05 MED ORDER — TECHNETIUM TC 99M MEDRONATE IV KIT
20.8000 | PACK | Freq: Once | INTRAVENOUS | Status: AC | PRN
  Administered 2018-02-05: 20.8 via INTRAVENOUS

## 2018-02-06 ENCOUNTER — Other Ambulatory Visit: Payer: Self-pay | Admitting: Oncology

## 2018-02-06 LAB — PROSTATE-SPECIFIC AG, SERUM (LABCORP): Prostate Specific Ag, Serum: 16.6 ng/mL — ABNORMAL HIGH (ref 0.0–4.0)

## 2018-02-07 ENCOUNTER — Telehealth: Payer: Self-pay

## 2018-02-07 ENCOUNTER — Inpatient Hospital Stay: Payer: Medicare Other | Admitting: Oncology

## 2018-02-07 VITALS — BP 136/81 | HR 71 | Temp 97.5°F | Resp 17 | Ht 75.0 in | Wt 238.4 lb

## 2018-02-07 DIAGNOSIS — C7951 Secondary malignant neoplasm of bone: Secondary | ICD-10-CM | POA: Diagnosis not present

## 2018-02-07 DIAGNOSIS — C61 Malignant neoplasm of prostate: Secondary | ICD-10-CM

## 2018-02-07 DIAGNOSIS — Z923 Personal history of irradiation: Secondary | ICD-10-CM | POA: Diagnosis not present

## 2018-02-07 DIAGNOSIS — Z79899 Other long term (current) drug therapy: Secondary | ICD-10-CM

## 2018-02-07 DIAGNOSIS — Z7982 Long term (current) use of aspirin: Secondary | ICD-10-CM

## 2018-02-07 DIAGNOSIS — I10 Essential (primary) hypertension: Secondary | ICD-10-CM

## 2018-02-07 NOTE — Telephone Encounter (Signed)
Printed avs and calender of upcoming appointment. Per 12/13 los  

## 2018-02-07 NOTE — Progress Notes (Signed)
Hematology and Oncology Follow Up Visit  [name] [id number] 07-21-1945 72 y.o. 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  He was initially diagnosed in 2003 with Gleason score 4 + 3 equals 7 and  PSA 6.45.  Prior Therapy:  1. He is status post a prostatectomy and lymph node dissection done on November 05, 2001.   2. S/P 6480 cGy of radiation therapy in 36 fractions between February and April 2005.   3. He developed advanced disease and was treated with Lupron in 2011.  His PSA nadir down to 4.11. PSA was up to 5.91 in December of 2012 with a testosterone level of 35. His staging workup did not reveal any bony metastasis.  4. Casodex was added to Lupron on 05/2011. This was discontinued in January 2016 due to progression of disease.  5. Zytiga 1000 mg daily started on 05/13/2014.  Therapy discontinued in December 2018 because of progression of disease. Bone scan of the time showed limited metastatic disease.  Current therapy:   Xtandi 160 mg daily started in December 2018.  Lupron 30 mg every 4 months.   Interim History: [name] presents today for a follow-up.  Since the last visit, he reports no major changes in his health.  He continues to tolerate Xtandi without any recent complications.  He denies any excessive fatigue, tiredness or hematuria.  He denies recent hospitalizations or illnesses.  He denies any worsening bone pain or pathological fractures.  His performance status quality of life remain excellent.  He has not reported any headaches, blurry vision, or syncope.  He denies any alteration in mental status or lethargy.  He does not report any fevers, chills or sweats. He denies any chest pain, palpitation or orthopnea.  He does not report any wheezing, cough or hemoptysis. He did not report any nausea or vomiting or early satiety.  He denies any changes in bowel  habits.  He has not reported any lymphadenopathy or petechiae.  He does not report frequency urgency or hesitancy.  He does not report any ecchymosis or petechiae.  He does not report any anxiety or depression.  He reports no lymphadenopathy or easy bruising.Marland Kitchen  Rest of the review of systems is negative.    Medications: I have reviewed the patient's current medications.  Current Outpatient Medications  Medication Sig Dispense Refill  . ALPRAZolam (XANAX) 0.5 MG tablet Take 0.5 mg by mouth 3 (three) times daily as needed for sleep.    Marland Kitchen amLODipine (NORVASC) 5 MG tablet amlodipine 5 mg tablet  Take 1 tablet every day by oral route.    . Ascorbic Acid (VITAMIN C) 100 MG CHEW Vitamin C  1 once a day    . aspirin EC 81 MG tablet Take 81 mg by mouth daily.    . calcium-vitamin D (OSCAL 500/200 D-3) 500-200 MG-UNIT tablet Take 1 tablet by mouth daily with breakfast. 1 tablet   . Cholecalciferol (VITAMIN D3) 3000 units TABS cholecalciferol (vitamin D3) 1,000 unit tablet  1 tablet orally once a day    . Leuprolide Acetate (LUPRON DEPOT IM) Inject into the muscle every 4 (four) months.    . Multiple Vitamins-Minerals (MULTIVITAMIN ADULT EXTRA C PO) multivitamin  1 tablet orally once a day    . Omega-3 Fatty Acids (FISH OIL) 1000 MG CAPS Fish Oil  2 tablet po daily    . XTANDI 40 MG capsule TAKE  4 CAPSULES (160MG ) BY MOUTH DAILY 120 capsule 0   No current facility-administered medications for this visit.     Allergies:  Allergies  Allergen Reactions  . Penicillins Rash and Other (See Comments)    Has patient had a PCN reaction causing immediate rash, facial/tongue/throat swelling, SOB or lightheadedness with hypotension: YES Has patient had a PCN reaction causing severe rash involving mucus membranes or skin necrosis: YES Has patient had a PCN reaction that required hospitalization: NO Has patient had a PCN reaction occurring within the last 10 years: NO     Past Medical History, Surgical  history, Social history, and Family History remains unchanged.   Physical Exam:  Blood pressure 136/81, pulse 71, temperature (!) 97.5 F (36.4 C), temperature source Oral, resp. rate 17, height 6\' 3"  (1.905 m), weight 238 lb 6.4 oz (108.1 kg), SpO2 99 %.   ECOG: 0   General appearance: Alert, awake without any distress. Head: Atraumatic without abnormalities Oropharynx: Without any thrush or ulcers. Eyes: No scleral icterus. Lymph nodes: No lymphadenopathy noted in the cervical, supraclavicular, or axillary nodes Heart:regular rate and rhythm, without any murmurs or gallops.   Lung: Clear to auscultation without any rhonchi, wheezes or dullness to percussion. Abdomin: Soft, nontender without any shifting dullness or ascites. Musculoskeletal: No clubbing or cyanosis. Neurological: No motor or sensory deficits. Skin: No rashes or lesions.    Lab Results: Lab Results  Component Value Date   WBC 6.2 02/05/2018   HGB 12.7 (L) 02/05/2018   HCT 38.7 (L) 02/05/2018   MCV 93.7 02/05/2018   PLT 176 02/05/2018     Chemistry      Component Value Date/Time   NA 142 02/05/2018 0812   NA 141 02/01/2017 0749   K 4.1 02/05/2018 0812   K 3.5 02/01/2017 0749   CL 106 02/05/2018 0812   CL 104 06/11/2012 0817   CO2 26 02/05/2018 0812   CO2 26 02/01/2017 0749   BUN 17 02/05/2018 0812   BUN 14.5 02/01/2017 0749   CREATININE 1.02 02/05/2018 0812   CREATININE 0.9 02/01/2017 0749      Component Value Date/Time   CALCIUM 9.0 02/05/2018 0812   CALCIUM 9.2 02/01/2017 0749   ALKPHOS 64 02/05/2018 0812   ALKPHOS 70 02/01/2017 0749   AST 13 (L) 02/05/2018 0812   AST 17 02/01/2017 0749   ALT 9 02/05/2018 0812   ALT 14 02/01/2017 0749   BILITOT 0.5 02/05/2018 0812   BILITOT 0.95 02/01/2017 0749       Results for Reames, [name] (MRN [id number]) as of 02/07/2018 12:09  Ref. Range 12/31/2017 07:54 02/05/2018 08:12  Prostate Specific Ag, Serum Latest Ref Range: 0.0 - 4.0 ng/mL 16.4  (H) 16.6 (H)     EXAM: NUCLEAR MEDICINE WHOLE BODY BONE SCAN  TECHNIQUE: Whole body anterior and posterior images were obtained approximately 3 hours after intravenous injection of radiopharmaceutical.  RADIOPHARMACEUTICALS:  20.8 mCi Technetium-24m MDP IV  COMPARISON:  Whole-body bone scan 02/14/2017. CT Chest, Abdomen, and Pelvis 02/01/2017.  FINDINGS: Expected radiotracer activity in both kidneys in the urinary bladder.  Persistent small foci of heterogeneous radiotracer activity in the bilateral ribs, upper thoracic spine, right posterior iliac wing, left lateral calvarium. Size and number of lesions has not significantly changed since 2018.  Degenerative appearing radiotracer activity at the hands, knees and feet. Probable right upper extremity injection site.  No new metastatic lesion identified.  IMPRESSION: Scintigraphic appearance of scattered bony metastatic disease not significantly changed since  02/14/2017.   Impression and Plan:  72 year old man with:  1. Castration-resistant prostate cancer with disease to the bone diagnosed in 2018.     He is currently on Xtandi with reasonable tolerance.  His PSA remains overall stable without any signs of progression of disease.  Bone scan obtained as well in December 2019 is no change from December 2018.  Risks and benefits of continuing the current therapy of Xtandi versus different salvage therapy was reviewed and after discussion we have elected to continue with the same dose and schedule.  Alternative therapy may be needed in the future in the form of Xofigo or systemic chemotherapy.  2. Androgen deprivation.  His next Lupron will be scheduled in March 2020.  Long-term complication associated with this therapy including osteoporosis, weight gain among others were reviewed.  Is agreeable to continue.  3.  Skeletal related events and bone directed therapy: I have recommended continuing calcium and vitamin D  supplements with consideration to Xgeva in the future.  4. Hypertension: No issues reported with elevated blood pressure at this time.  His blood pressure is manageable.  5. Follow up: in 3 months to follow his progress.  15  minutes was spent with the patient face-to-face today.  More than 50% of time was dedicated to discussing his imaging studies, laboratory data and options of therapy.    Zola Button, MD 12/13/201912:26 PM

## 2018-02-13 MED FILL — XTANDI 40 MG CAPSULE: 40 | 30 days supply | Qty: 120 | Fill #0

## 2018-03-06 ENCOUNTER — Other Ambulatory Visit: Payer: Self-pay | Admitting: Oncology

## 2018-03-14 MED FILL — XTANDI 40 MG CAPSULE: 40 | 30 days supply | Qty: 120 | Fill #0

## 2018-04-04 ENCOUNTER — Other Ambulatory Visit: Payer: Self-pay | Admitting: Oncology

## 2018-04-08 MED FILL — XTANDI 40 MG CAPSULE: 40 | 30 days supply | Qty: 120 | Fill #0

## 2018-04-29 ENCOUNTER — Other Ambulatory Visit: Payer: Self-pay | Admitting: Oncology

## 2018-05-08 ENCOUNTER — Other Ambulatory Visit: Payer: Self-pay

## 2018-05-08 ENCOUNTER — Inpatient Hospital Stay: Payer: Medicare Other

## 2018-05-08 ENCOUNTER — Encounter: Payer: Self-pay | Admitting: *Deleted

## 2018-05-08 ENCOUNTER — Inpatient Hospital Stay: Payer: Medicare Other | Attending: Oncology | Admitting: Oncology

## 2018-05-08 ENCOUNTER — Telehealth: Payer: Self-pay | Admitting: Oncology

## 2018-05-08 VITALS — BP 138/78 | HR 65 | Temp 97.9°F | Resp 17 | Ht 75.0 in | Wt 235.0 lb

## 2018-05-08 DIAGNOSIS — R5383 Other fatigue: Secondary | ICD-10-CM

## 2018-05-08 DIAGNOSIS — Z923 Personal history of irradiation: Secondary | ICD-10-CM | POA: Insufficient documentation

## 2018-05-08 DIAGNOSIS — Z79818 Long term (current) use of other agents affecting estrogen receptors and estrogen levels: Secondary | ICD-10-CM

## 2018-05-08 DIAGNOSIS — C61 Malignant neoplasm of prostate: Secondary | ICD-10-CM

## 2018-05-08 DIAGNOSIS — Z7982 Long term (current) use of aspirin: Secondary | ICD-10-CM | POA: Insufficient documentation

## 2018-05-08 DIAGNOSIS — Z79899 Other long term (current) drug therapy: Secondary | ICD-10-CM | POA: Insufficient documentation

## 2018-05-08 DIAGNOSIS — I10 Essential (primary) hypertension: Secondary | ICD-10-CM | POA: Diagnosis not present

## 2018-05-08 DIAGNOSIS — C7951 Secondary malignant neoplasm of bone: Secondary | ICD-10-CM | POA: Insufficient documentation

## 2018-05-08 LAB — CMP (CANCER CENTER ONLY)
ALT: 9 U/L (ref 0–44)
ANION GAP: 9 (ref 5–15)
AST: 14 U/L — ABNORMAL LOW (ref 15–41)
Albumin: 3.5 g/dL (ref 3.5–5.0)
Alkaline Phosphatase: 69 U/L (ref 38–126)
BUN: 20 mg/dL (ref 8–23)
CO2: 25 mmol/L (ref 22–32)
Calcium: 9 mg/dL (ref 8.9–10.3)
Chloride: 105 mmol/L (ref 98–111)
Creatinine: 1 mg/dL (ref 0.61–1.24)
GFR, Est AFR Am: >60 mL/min (ref >60)
GLUCOSE: 106 mg/dL — AB (ref 70–99)
Potassium: 4.2 mmol/L (ref 3.5–5.1)
Sodium: 139 mmol/L (ref 135–145)
Total Bilirubin: 0.4 mg/dL (ref 0.3–1.2)
Total Protein: 7.1 g/dL (ref 6.5–8.1)

## 2018-05-08 LAB — CBC WITH DIFFERENTIAL (CANCER CENTER ONLY)
Abs Immature Granulocytes: 0.02 10*3/uL (ref 0.00–0.07)
Basophils Absolute: 0 10*3/uL (ref 0.0–0.1)
Basophils Relative: 0 %
EOS ABS: 0.2 10*3/uL (ref 0.0–0.5)
Eosinophils Relative: 2 %
HCT: 39.8 % (ref 39.0–52.0)
Hemoglobin: 13.1 g/dL (ref 13.0–17.0)
Immature Granulocytes: 0 %
Lymphocytes Relative: 27 %
Lymphs Abs: 1.8 10*3/uL (ref 0.7–4.0)
MCH: 31 pg (ref 26.0–34.0)
MCHC: 32.9 g/dL (ref 30.0–36.0)
MCV: 94.3 fL (ref 80.0–100.0)
MONOS PCT: 6 %
Monocytes Absolute: 0.4 10*3/uL (ref 0.1–1.0)
Neutro Abs: 4.4 10*3/uL (ref 1.7–7.7)
Neutrophils Relative %: 65 %
Platelet Count: 194 10*3/uL (ref 150–400)
RBC: 4.22 MIL/uL (ref 4.22–5.81)
RDW: 12.8 % (ref 11.5–15.5)
WBC Count: 6.8 10*3/uL (ref 4.0–10.5)
nRBC: 0 % (ref 0.0–0.2)

## 2018-05-08 MED ORDER — LEUPROLIDE ACETATE (4 MONTH) 30 MG IM KIT
30.0000 mg | PACK | Freq: Once | INTRAMUSCULAR | Status: AC
  Administered 2018-05-08: 30 mg via INTRAMUSCULAR
  Filled 2018-05-08: qty 30

## 2018-05-08 NOTE — Patient Instructions (Signed)

## 2018-05-08 NOTE — Telephone Encounter (Signed)
Gave avs and calendar ° °

## 2018-05-08 NOTE — Progress Notes (Signed)
Hematology and Oncology Follow Up Visit  [name] [id number] 05-Dec-1945 73 y.o. 05/08/2018 2:58 PM   Principle Diagnosis: 73 year-old man with advanced prostate cancer with disease to the bone documented in 2018.  He presented with Gleason score 4 + 3 equals 7 and  PSA 6.45 in 2003.  Prior Therapy:  1. He is status post a prostatectomy and lymph node dissection done on November 05, 2001.   2. S/P 6480 cGy of radiation therapy in 36 fractions between February and April 2005.   3. He developed advanced disease and was treated with Lupron in 2011.  His PSA nadir down to 4.11. PSA was up to 5.91 in December of 2012 with a testosterone level of 35. His staging workup did not reveal any bony metastasis.  4. Casodex was added to Lupron on 05/2011. This was discontinued in January 2016 due to progression of disease.  5. Zytiga 1000 mg daily started on 05/13/2014.  Therapy discontinued in December 2018 because of progression of disease. Bone scan of the time showed limited metastatic disease.  Current therapy:   Xtandi 160 mg daily started in December 2018.  Lupron 30 mg every 4 months.   Interim History: [name] returns today for a repeat evaluation.  Since the last visit, he reports no major changes in his health.  He continues to tolerate Xtandi without any major complaints.  He denies any recent nausea, fatigue or tiredness.  He denies any bone pain or pathological fractures.  His performance status and quality of life remains unchanged.  Patient denied any alteration mental status, neuropathy, confusion or dizziness.  Denies any headaches or lethargy.  Denies any night sweats, weight loss or changes in appetite.  Denied orthopnea, dyspnea on exertion or chest discomfort.  Denies shortness of breath, difficulty breathing hemoptysis or cough.  Denies any abdominal distention, nausea, early satiety or dyspepsia.  Denies any hematuria, frequency, dysuria or nocturia.  Denies any skin  irritation, dryness or rash.  Denies any ecchymosis or petechiae.  Denies any lymphadenopathy or clotting.  Denies any heat or cold intolerance.  Denies any anxiety or depression.  Remaining review of system is negative.      Medications: I have reviewed the patient's current medications.  Current Outpatient Medications  Medication Sig Dispense Refill  . ALPRAZolam (XANAX) 0.5 MG tablet Take 0.5 mg by mouth 3 (three) times daily as needed for sleep.    Marland Kitchen amLODipine (NORVASC) 5 MG tablet amlodipine 5 mg tablet  Take 1 tablet every day by oral route.    . Ascorbic Acid (VITAMIN C) 100 MG CHEW Vitamin C  1 once a day    . aspirin EC 81 MG tablet Take 81 mg by mouth daily.    . calcium-vitamin D (OSCAL 500/200 D-3) 500-200 MG-UNIT tablet Take 1 tablet by mouth daily with breakfast. 1 tablet   . Cholecalciferol (VITAMIN D3) 3000 units TABS cholecalciferol (vitamin D3) 1,000 unit tablet  1 tablet orally once a day    . Leuprolide Acetate (LUPRON DEPOT IM) Inject into the muscle every 4 (four) months.    . Multiple Vitamins-Minerals (MULTIVITAMIN ADULT EXTRA C PO) multivitamin  1 tablet orally once a day    . Omega-3 Fatty Acids (FISH OIL) 1000 MG CAPS Fish Oil  2 tablet po daily    . XTANDI 40 MG capsule TAKE 4 CAPSULES (160MG ) BY MOUTH DAILY 120 capsule 0   No current facility-administered medications for this visit.  Allergies:  Allergies  Allergen Reactions  . Penicillins Rash and Other (See Comments)    Has patient had a PCN reaction causing immediate rash, facial/tongue/throat swelling, SOB or lightheadedness with hypotension: YES Has patient had a PCN reaction causing severe rash involving mucus membranes or skin necrosis: YES Has patient had a PCN reaction that required hospitalization: NO Has patient had a PCN reaction occurring within the last 10 years: NO     Past Medical History, Surgical history, Social history, and Family History remains unchanged.   Physical  Exam:  Blood pressure 138/78, pulse 65, temperature 97.9 F (36.6 C), temperature source Oral, resp. rate 17, height 6\' 3"  (1.905 m), weight 235 lb (106.6 kg), SpO2 100 %.    ECOG: 0   General appearance: Comfortable appearing without any discomfort Head: Normocephalic without any trauma Oropharynx: Mucous membranes are moist and pink without any thrush or ulcers. Eyes: Pupils are equal and round reactive to light. Lymph nodes: No cervical, supraclavicular, inguinal or axillary lymphadenopathy.   Heart:regular rate and rhythm.  S1 and S2 without leg edema. Lung: Clear without any rhonchi or wheezes.  No dullness to percussion. Abdomin: Soft, nontender, nondistended with good bowel sounds.  No hepatosplenomegaly. Musculoskeletal: No joint deformity or effusion.  Full range of motion noted. Neurological: No deficits noted on motor, sensory and deep tendon reflex exam. Skin: No petechial rash or dryness.  Appeared moist.  Psychiatric: Mood and affect appeared appropriate.      Lab Results: Lab Results  Component Value Date   WBC 6.2 02/05/2018   HGB 12.7 (L) 02/05/2018   HCT 38.7 (L) 02/05/2018   MCV 93.7 02/05/2018   PLT 176 02/05/2018     Chemistry      Component Value Date/Time   NA 142 02/05/2018 0812   NA 141 02/01/2017 0749   K 4.1 02/05/2018 0812   K 3.5 02/01/2017 0749   CL 106 02/05/2018 0812   CL 104 06/11/2012 0817   CO2 26 02/05/2018 0812   CO2 26 02/01/2017 0749   BUN 17 02/05/2018 0812   BUN 14.5 02/01/2017 0749   CREATININE 1.02 02/05/2018 0812   CREATININE 0.9 02/01/2017 0749      Component Value Date/Time   CALCIUM 9.0 02/05/2018 0812   CALCIUM 9.2 02/01/2017 0749   ALKPHOS 64 02/05/2018 0812   ALKPHOS 70 02/01/2017 0749   AST 13 (L) 02/05/2018 0812   AST 17 02/01/2017 0749   ALT 9 02/05/2018 0812   ALT 14 02/01/2017 0749   BILITOT 0.5 02/05/2018 0812   BILITOT 0.95 02/01/2017 0749       Results for [name] (MRN [id number])  as of 05/08/2018 14:20  Ref. Range 02/05/2018 08:12  Prostate Specific Ag, Serum Latest Ref Range: 0.0 - 4.0 ng/mL 16.6 (H)     Impression and Plan:  73 year old man with:  1.  Advanced prostate cancer with disease to the bone diagnosed 2018.  He has castration-resistant disease at this time.   He has tolerated Xtandi without any major issues or concerns.  Risks and benefits of continuing this therapy for the time being were discussed.  Dentia long-term complications including hypertension and fatigue among others were reviewed.  The plan is to continue to monitor his PSA carefully and consider different salvage therapy if he develops progression of disease.  2. Androgen deprivation.  He will receive Lupron today and every 4 months.  Long-term complications including osteoporosis, weight gain and hot flashes.  Is agreeable to  continue.  3.  Skeletal related events and bone directed therapy: I recommended continuing calcium and vitamin D supplements with consideration for Xgeva if he has progression of disease.  4. Hypertension: Blood pressure is within normal range at this time.  We will continue to monitor on Xtandi.  5. Follow up: in 3 months for repeat evaluation.  15  minutes was spent with the patient face-to-face today.  More than 50% of time was spent on reviewing his disease status, reviewing laboratory data, discussing treatment options and answering questions regarding plan of care.    Zola Button, MD 3/12/20202:58 PM

## 2018-05-09 ENCOUNTER — Telehealth: Payer: Self-pay | Admitting: *Deleted

## 2018-05-09 LAB — PROSTATE-SPECIFIC AG, SERUM (LABCORP): Prostate Specific Ag, Serum: 25.4 ng/mL — ABNORMAL HIGH (ref 0.0–4.0)

## 2018-05-09 NOTE — Telephone Encounter (Signed)
Notified of message below

## 2018-05-09 NOTE — Telephone Encounter (Signed)
-----   Message from [name], MD sent at 05/09/2018  8:04 AM EDT ----- Please let him know his PSA is up. No changes for now and will repeat PSA is 3 months.

## 2018-05-12 MED FILL — XTANDI 40 MG CAPSULE: 40 | 30 days supply | Qty: 120 | Fill #0

## 2018-05-14 ENCOUNTER — Encounter: Payer: Self-pay | Admitting: *Deleted

## 2018-05-14 ENCOUNTER — Telehealth: Payer: Self-pay | Admitting: *Deleted

## 2018-05-14 NOTE — Telephone Encounter (Signed)
Ok to write the letter. Thanks.

## 2018-05-14 NOTE — Telephone Encounter (Signed)
Patient called requesting letter from Dr. Alen Nichols for recommendation to be released from responsibility of jury duty that is coming up on May 14th due to on going cancer treatment and potential immunosuppression.    Request forwarded to provider.  Upon approval and completion please mail to home address on file.

## 2018-05-15 NOTE — Progress Notes (Signed)
Mailed letter to patient

## 2018-05-21 ENCOUNTER — Encounter: Payer: Self-pay | Admitting: Pharmacist

## 2018-05-31 ENCOUNTER — Other Ambulatory Visit: Payer: Self-pay | Admitting: Oncology

## 2018-06-05 MED FILL — XTANDI 40 MG CAPSULE: 40 | 30 days supply | Qty: 120 | Fill #0

## 2018-06-27 ENCOUNTER — Other Ambulatory Visit: Payer: Self-pay | Admitting: Oncology

## 2018-06-28 MED FILL — XTANDI 40 MG CAPSULE: 40 | 30 days supply | Qty: 120 | Fill #0

## 2018-07-22 ENCOUNTER — Other Ambulatory Visit: Payer: Self-pay | Admitting: Oncology

## 2018-07-29 MED FILL — XTANDI 40 MG CAPSULE: 40 | 30 days supply | Qty: 120 | Fill #0

## 2018-08-12 ENCOUNTER — Inpatient Hospital Stay: Payer: Medicare Other

## 2018-08-12 ENCOUNTER — Telehealth: Payer: Self-pay | Admitting: Oncology

## 2018-08-12 ENCOUNTER — Other Ambulatory Visit: Payer: Self-pay

## 2018-08-12 ENCOUNTER — Inpatient Hospital Stay: Payer: Medicare Other | Attending: Oncology | Admitting: Oncology

## 2018-08-12 VITALS — BP 140/77 | HR 66 | Temp 97.0°F | Resp 17 | Wt 237.1 lb

## 2018-08-12 DIAGNOSIS — Z192 Hormone resistant malignancy status: Secondary | ICD-10-CM

## 2018-08-12 DIAGNOSIS — C7951 Secondary malignant neoplasm of bone: Secondary | ICD-10-CM | POA: Diagnosis not present

## 2018-08-12 DIAGNOSIS — Z7982 Long term (current) use of aspirin: Secondary | ICD-10-CM | POA: Diagnosis not present

## 2018-08-12 DIAGNOSIS — C61 Malignant neoplasm of prostate: Secondary | ICD-10-CM | POA: Insufficient documentation

## 2018-08-12 DIAGNOSIS — Z79899 Other long term (current) drug therapy: Secondary | ICD-10-CM | POA: Insufficient documentation

## 2018-08-12 DIAGNOSIS — Z923 Personal history of irradiation: Secondary | ICD-10-CM | POA: Insufficient documentation

## 2018-08-12 DIAGNOSIS — I10 Essential (primary) hypertension: Secondary | ICD-10-CM

## 2018-08-12 LAB — CMP (CANCER CENTER ONLY)
ALT: 13 U/L (ref 0–44)
AST: 15 U/L (ref 15–41)
Albumin: 3.5 g/dL (ref 3.5–5.0)
Alkaline Phosphatase: 64 U/L (ref 38–126)
Anion gap: 9 (ref 5–15)
BUN: 17 mg/dL (ref 8–23)
CO2: 27 mmol/L (ref 22–32)
Calcium: 9.1 mg/dL (ref 8.9–10.3)
Chloride: 104 mmol/L (ref 98–111)
Creatinine: 1.07 mg/dL (ref 0.61–1.24)
GFR, Est AFR Am: >60 mL/min (ref >60)
GFR, Estimated: >60 mL/min (ref >60)
Glucose, Bld: 99 mg/dL (ref 70–99)
Potassium: 4.8 mmol/L (ref 3.5–5.1)
Sodium: 140 mmol/L (ref 135–145)
Total Bilirubin: 0.4 mg/dL (ref 0.3–1.2)
Total Protein: 7.1 g/dL (ref 6.5–8.1)

## 2018-08-12 LAB — CBC WITH DIFFERENTIAL (CANCER CENTER ONLY)
Abs Immature Granulocytes: 0.03 10*3/uL (ref 0.00–0.07)
Basophils Absolute: 0 10*3/uL (ref 0.0–0.1)
Basophils Relative: 0 %
Eosinophils Absolute: 0.2 10*3/uL (ref 0.0–0.5)
Eosinophils Relative: 3 %
HCT: 42 % (ref 39.0–52.0)
Hemoglobin: 13.5 g/dL (ref 13.0–17.0)
Immature Granulocytes: 0 %
Lymphocytes Relative: 32 %
Lymphs Abs: 2.5 10*3/uL (ref 0.7–4.0)
MCH: 30.5 pg (ref 26.0–34.0)
MCHC: 32.1 g/dL (ref 30.0–36.0)
MCV: 94.8 fL (ref 80.0–100.0)
Monocytes Absolute: 0.5 10*3/uL (ref 0.1–1.0)
Monocytes Relative: 7 %
Neutro Abs: 4.4 10*3/uL (ref 1.7–7.7)
Neutrophils Relative %: 58 %
Platelet Count: 204 10*3/uL (ref 150–400)
RBC: 4.43 MIL/uL (ref 4.22–5.81)
RDW: 12.7 % (ref 11.5–15.5)
WBC Count: 7.7 10*3/uL (ref 4.0–10.5)
nRBC: 0 % (ref 0.0–0.2)

## 2018-08-12 NOTE — Telephone Encounter (Signed)
Scheduled appt per 6/16 los. Printed calendar and avs.  Gave patient contrast and the number to central radiology.

## 2018-08-12 NOTE — Progress Notes (Signed)
Hematology and Oncology Follow Up Visit  [name] [id number] 10-26-1945 73 y.o. 08/12/2018 9:04 AM   Principle Diagnosis: 73 year old man with castration-resistant prostate cancer with disease to the bone noted in 2018.  He was initially diagnosed in 2003 with Gleason score 4 + 3 equals 7 and  PSA 6.45 cancer.  Prior Therapy:  1. He is status post a prostatectomy and lymph node dissection done on November 05, 2001.   2. S/P 6480 cGy of radiation therapy in 36 fractions between February and April 2005.   3. He developed advanced disease and was treated with Lupron in 2011.  His PSA nadir down to 4.11. PSA was up to 5.91 in December of 2012 with a testosterone level of 35. His staging workup did not reveal any bony metastasis.  4. Casodex was added to Lupron on 05/2011. This was discontinued in January 2016 due to progression of disease.  5. Zytiga 1000 mg daily started on 05/13/2014.  Therapy discontinued in December 2018 because of progression of disease. Bone scan of the time showed limited metastatic disease.  Current therapy:   Xtandi 160 mg daily started in December 2018.  Lupron 30 mg every 4 months.   Interim History: [name] is here for a follow-up visit.  Since last visit, he reports no major changes in his health.  Continues to be active and attends activities of daily living.  He has limited his activity outside of the house intentionally.  He denies any back pain, shoulder pain or pathological fractures.  He denies any respiratory issues.  He denies any urological complaints or hospitalization.  His appetite remain excellent.  He denied headaches, blurry vision, syncope or seizures.  Denies any fevers, chills or sweats.  Denied chest pain, palpitation, orthopnea or leg edema.  Denied cough, wheezing or hemoptysis.  Denied nausea, vomiting or abdominal pain.  Denies any constipation or diarrhea.  Denies any frequency urgency or hesitancy.  Denies any arthralgias or  myalgias.  Denies any skin rashes or lesions.  Denies any bleeding or clotting tendency.  Denies any easy bruising.  Denies any hair or nail changes.  Denies any anxiety or depression.  Remaining review of system is negative.       Medications: I have reviewed the patient's current medications.  Current Outpatient Medications  Medication Sig Dispense Refill  . ALPRAZolam (XANAX) 0.5 MG tablet Take 0.5 mg by mouth 3 (three) times daily as needed for sleep.    Marland Kitchen amLODipine (NORVASC) 5 MG tablet amlodipine 5 mg tablet  Take 1 tablet every day by oral route.    . Ascorbic Acid (VITAMIN C) 100 MG CHEW Vitamin C  1 once a day    . aspirin EC 81 MG tablet Take 81 mg by mouth daily.    . calcium-vitamin D (OSCAL 500/200 D-3) 500-200 MG-UNIT tablet Take 1 tablet by mouth daily with breakfast. 1 tablet   . Cholecalciferol (VITAMIN D3) 3000 units TABS cholecalciferol (vitamin D3) 1,000 unit tablet  1 tablet orally once a day    . Leuprolide Acetate (LUPRON DEPOT IM) Inject into the muscle every 4 (four) months.    . Multiple Vitamins-Minerals (MULTIVITAMIN ADULT EXTRA C PO) multivitamin  1 tablet orally once a day    . Omega-3 Fatty Acids (FISH OIL) 1000 MG CAPS Fish Oil  2 tablet po daily    . XTANDI 40 MG capsule TAKE 4 CAPSULES (160MG ) BY MOUTH DAILY 120 capsule 0   No current facility-administered medications  for this visit.     Allergies:  Allergies  Allergen Reactions  . Penicillins Rash and Other (See Comments)    Has patient had a PCN reaction causing immediate rash, facial/tongue/throat swelling, SOB or lightheadedness with hypotension: YES Has patient had a PCN reaction causing severe rash involving mucus membranes or skin necrosis: YES Has patient had a PCN reaction that required hospitalization: NO Has patient had a PCN reaction occurring within the last 10 years: NO     Past Medical History, Surgical history, Social history, and Family History remains unchanged.   Physical  Exam:  Blood pressure 140/77, pulse 66, temperature (!) 97 F (36.1 C), temperature source Oral, resp. rate 17, weight 237 lb 2 oz (107.6 kg), SpO2 100 %.    ECOG: 0     General appearance: Alert, awake without any distress. Head: Atraumatic without abnormalities Oropharynx: Without any thrush or ulcers. Eyes: No scleral icterus. Lymph nodes: No lymphadenopathy noted in the cervical, supraclavicular, or axillary nodes Heart:regular rate and rhythm, without any murmurs or gallops.   Lung: Clear to auscultation without any rhonchi, wheezes or dullness to percussion. Abdomin: Soft, nontender without any shifting dullness or ascites. Musculoskeletal: No clubbing or cyanosis. Neurological: No motor or sensory deficits. Skin: No rashes or lesions. Psychiatric: Mood and affect appeared normal.       Lab Results: Lab Results  Component Value Date   WBC 7.7 08/12/2018   HGB 13.5 08/12/2018   HCT 42.0 08/12/2018   MCV 94.8 08/12/2018   PLT 204 08/12/2018     Chemistry      Component Value Date/Time   NA 139 05/08/2018 1451   NA 141 02/01/2017 0749   K 4.2 05/08/2018 1451   K 3.5 02/01/2017 0749   CL 105 05/08/2018 1451   CL 104 06/11/2012 0817   CO2 25 05/08/2018 1451   CO2 26 02/01/2017 0749   BUN 20 05/08/2018 1451   BUN 14.5 02/01/2017 0749   CREATININE 1.00 05/08/2018 1451   CREATININE 0.9 02/01/2017 0749      Component Value Date/Time   CALCIUM 9.0 05/08/2018 1451   CALCIUM 9.2 02/01/2017 0749   ALKPHOS 69 05/08/2018 1451   ALKPHOS 70 02/01/2017 0749   AST 14 (L) 05/08/2018 1451   AST 17 02/01/2017 0749   ALT 9 05/08/2018 1451   ALT 14 02/01/2017 0749   BILITOT 0.4 05/08/2018 1451   BILITOT 0.95 02/01/2017 0749       Results for Seeberger, CRESENCIO REESOR (MRN [id number]) as of 08/12/2018 07:43  Ref. Range 02/05/2018 08:12 05/08/2018 15:28  Prostate Specific Ag, Serum Latest Ref Range: 0.0 - 4.0 ng/mL 16.6 (H) 25.4 (H)      Impression and  Plan:  73 year old man with:  1.  Castration-resistant prostate cancer with limited disease to the bone.  He remains on Xtandi without any major complications.  The natural course of this disease and treatment options were reiterated today.  His last PSA did show slight increase but he remains asymptomatic.  Treatment options would include systemic chemotherapy versus different oral targeted therapy.  Obtaining sample for next generation sequencing and possible PARP inhibitor would be a possibility as well.  For the time being we will defer this option unless he develops symptomatic progression.  We will obtain staging work-up before the next visit.  2. Androgen deprivation.  I recommended Lupron to be given every 4 to 6 months.  Next injection will be tentatively in September 2020.  3.  Skeletal  related events and bone directed therapy: I continue to encourage her to take calcium and vitamin D supplements.  Will defer [name] unless he develops worsening bony metastatic disease.  4. Hypertension: No major issues reported on his blood pressure.  We will continue to monitor on Xtandi.  5. Follow up: We will be in 3 months and sooner if needed.  25  minutes was spent with the patient face-to-face today.  More than 50% of time was was dedicated to reviewing his disease status, treatment options answering questions regarding future plan of care.    Zola Button, MD 6/16/20209:04 AM

## 2018-08-13 LAB — PROSTATE-SPECIFIC AG, SERUM (LABCORP): Prostate Specific Ag, Serum: 30.7 ng/mL — ABNORMAL HIGH (ref 0.0–4.0)

## 2018-08-14 ENCOUNTER — Telehealth: Payer: Self-pay

## 2018-08-14 NOTE — Telephone Encounter (Signed)
Contacted patient and made aware of results and reviewed the rest of the lab work with patient per request. Patient then stated that he is hesitant to get the scans in September as his spouse is concerned about exposure from all the scans. Discussed risk vs benefits and if he is hesitant to get the scans prior to September to call back and can follow up with Dr. Alen Blew. Patient is aware of the purpose of the scans as well.

## 2018-08-14 NOTE — Telephone Encounter (Signed)
-----   Message from [name], MD sent at 08/13/2018  8:20 AM EDT ----- Please let him know his PSA is slightly up but no changes for now.

## 2018-08-20 ENCOUNTER — Other Ambulatory Visit: Payer: Self-pay | Admitting: Oncology

## 2018-08-25 MED FILL — XTANDI 40 MG CAPSULE: 40 | 30 days supply | Qty: 120 | Fill #0

## 2018-09-17 ENCOUNTER — Other Ambulatory Visit: Payer: Self-pay | Admitting: Oncology

## 2018-09-24 MED FILL — XTANDI 40 MG CAPSULE: 40 | 30 days supply | Qty: 120 | Fill #0

## 2018-10-20 ENCOUNTER — Other Ambulatory Visit: Payer: Self-pay | Admitting: Oncology

## 2018-10-22 MED FILL — XTANDI 40 MG CAPSULE: 40 | 30 days supply | Qty: 120 | Fill #0

## 2018-11-11 ENCOUNTER — Ambulatory Visit (HOSPITAL_COMMUNITY)
Admission: RE | Admit: 2018-11-11 | Discharge: 2018-11-11 | Disposition: A | Discharge status: Home / Self Care | Payer: Medicare Other | Source: Ambulatory Visit | Attending: Oncology | Admitting: Oncology

## 2018-11-11 ENCOUNTER — Inpatient Hospital Stay: Payer: Medicare Other | Attending: Oncology

## 2018-11-11 ENCOUNTER — Other Ambulatory Visit: Payer: Self-pay

## 2018-11-11 ENCOUNTER — Encounter (HOSPITAL_COMMUNITY)
Admission: RE | Admit: 2018-11-11 | Discharge: 2018-11-11 | Disposition: A | Discharge status: Home / Self Care | Payer: Medicare Other | Source: Ambulatory Visit | Attending: Oncology | Admitting: Oncology

## 2018-11-11 DIAGNOSIS — C7951 Secondary malignant neoplasm of bone: Secondary | ICD-10-CM | POA: Diagnosis not present

## 2018-11-11 DIAGNOSIS — Z79818 Long term (current) use of other agents affecting estrogen receptors and estrogen levels: Secondary | ICD-10-CM | POA: Diagnosis not present

## 2018-11-11 DIAGNOSIS — R5383 Other fatigue: Secondary | ICD-10-CM | POA: Insufficient documentation

## 2018-11-11 DIAGNOSIS — Z192 Hormone resistant malignancy status: Secondary | ICD-10-CM | POA: Diagnosis not present

## 2018-11-11 DIAGNOSIS — C61 Malignant neoplasm of prostate: Secondary | ICD-10-CM | POA: Insufficient documentation

## 2018-11-11 DIAGNOSIS — R531 Weakness: Secondary | ICD-10-CM | POA: Diagnosis not present

## 2018-11-11 DIAGNOSIS — Z923 Personal history of irradiation: Secondary | ICD-10-CM | POA: Diagnosis not present

## 2018-11-11 LAB — CBC WITH DIFFERENTIAL (CANCER CENTER ONLY)
Abs Immature Granulocytes: 0.03 10*3/uL (ref 0.00–0.07)
Basophils Absolute: 0 10*3/uL (ref 0.0–0.1)
Basophils Relative: 1 %
Eosinophils Absolute: 0.2 10*3/uL (ref 0.0–0.5)
Eosinophils Relative: 2 %
HCT: 39.9 % (ref 39.0–52.0)
Hemoglobin: 13.2 g/dL (ref 13.0–17.0)
Immature Granulocytes: 0 %
Lymphocytes Relative: 30 %
Lymphs Abs: 2.3 10*3/uL (ref 0.7–4.0)
MCH: 31.1 pg (ref 26.0–34.0)
MCHC: 33.1 g/dL (ref 30.0–36.0)
MCV: 94.1 fL (ref 80.0–100.0)
Monocytes Absolute: 0.5 10*3/uL (ref 0.1–1.0)
Monocytes Relative: 7 %
Neutro Abs: 4.5 10*3/uL (ref 1.7–7.7)
Neutrophils Relative %: 60 %
Platelet Count: 201 10*3/uL (ref 150–400)
RBC: 4.24 MIL/uL (ref 4.22–5.81)
RDW: 12.7 % (ref 11.5–15.5)
WBC Count: 7.5 10*3/uL (ref 4.0–10.5)
nRBC: 0 % (ref 0.0–0.2)

## 2018-11-11 LAB — CMP (CANCER CENTER ONLY)
ALT: 9 U/L (ref 0–44)
AST: 14 U/L — ABNORMAL LOW (ref 15–41)
Albumin: 3.5 g/dL (ref 3.5–5.0)
Alkaline Phosphatase: 59 U/L (ref 38–126)
Anion gap: 7 (ref 5–15)
BUN: 20 mg/dL (ref 8–23)
CO2: 27 mmol/L (ref 22–32)
Calcium: 9 mg/dL (ref 8.9–10.3)
Chloride: 105 mmol/L (ref 98–111)
Creatinine: 1.14 mg/dL (ref 0.61–1.24)
GFR, Est AFR Am: >60 mL/min (ref >60)
GFR, Estimated: >60 mL/min (ref >60)
Glucose, Bld: 126 mg/dL — ABNORMAL HIGH (ref 70–99)
Potassium: 4.2 mmol/L (ref 3.5–5.1)
Sodium: 139 mmol/L (ref 135–145)
Total Bilirubin: 0.5 mg/dL (ref 0.3–1.2)
Total Protein: 6.8 g/dL (ref 6.5–8.1)

## 2018-11-11 MED ORDER — TECHNETIUM TC 99M MEDRONATE IV KIT
20.4000 | PACK | Freq: Once | INTRAVENOUS | Status: AC
  Administered 2018-11-11: 20.4 via INTRAVENOUS

## 2018-11-11 MED ORDER — IOHEXOL 300 MG/ML  SOLN
100.0000 mL | Freq: Once | INTRAMUSCULAR | Status: AC | PRN
  Administered 2018-11-11: 100 mL via INTRAVENOUS

## 2018-11-11 MED ORDER — IOHEXOL 300 MG/ML  SOLN
30.0000 mL | Freq: Once | INTRAMUSCULAR | Status: AC | PRN
  Administered 2018-11-11: 30 mL via ORAL

## 2018-11-11 MED ORDER — SODIUM CHLORIDE (PF) 0.9 % IJ SOLN
INTRAMUSCULAR | Status: AC
  Filled 2018-11-11: qty 50

## 2018-11-12 LAB — PROSTATE-SPECIFIC AG, SERUM (LABCORP): Prostate Specific Ag, Serum: 41.5 ng/mL — ABNORMAL HIGH (ref 0.0–4.0)

## 2018-11-13 ENCOUNTER — Inpatient Hospital Stay: Payer: Medicare Other

## 2018-11-13 ENCOUNTER — Encounter: Payer: Self-pay | Admitting: Oncology

## 2018-11-13 ENCOUNTER — Inpatient Hospital Stay (HOSPITAL_BASED_OUTPATIENT_CLINIC_OR_DEPARTMENT_OTHER): Payer: Medicare Other | Admitting: Oncology

## 2018-11-13 ENCOUNTER — Other Ambulatory Visit: Payer: Self-pay

## 2018-11-13 VITALS — BP 141/81 | HR 62 | Temp 97.8°F | Resp 18 | Wt 236.3 lb

## 2018-11-13 DIAGNOSIS — C61 Malignant neoplasm of prostate: Secondary | ICD-10-CM

## 2018-11-13 MED ORDER — LEUPROLIDE ACETATE (4 MONTH) 30 MG IM KIT: 30.0000 mg | PACK | Freq: Once | INTRAMUSCULAR | Status: DC

## 2018-11-13 MED ORDER — LEUPROLIDE ACETATE (4 MONTH) 30 MG ~~LOC~~ KIT
30.0000 mg | PACK | Freq: Once | SUBCUTANEOUS | Status: AC
  Administered 2018-11-13: 30 mg via SUBCUTANEOUS
  Filled 2018-11-13: qty 30

## 2018-11-13 NOTE — Patient Instructions (Signed)
Leuprolide injection What is this medicine? LEUPROLIDE (loo PROE lide) is a man-made hormone. It is used to treat the symptoms of prostate cancer. This medicine may also be used to treat children with early onset of puberty. It may be used for other hormonal conditions. This medicine may be used for other purposes; ask your health care provider or pharmacist if you have questions. COMMON BRAND NAME(S): Lupron What should I tell my health care provider before I take this medicine? They need to know if you have any of these conditions:  diabetes  heart disease or previous heart attack  high blood pressure  high cholesterol  pain or difficulty passing urine  spinal cord metastasis  stroke  tobacco smoker  an unusual or allergic reaction to leuprolide, benzyl alcohol, other medicines, foods, dyes, or preservatives  pregnant or trying to get pregnant  breast-feeding How should I use this medicine? This medicine is for injection under the skin or into a muscle. You will be taught how to prepare and give this medicine. Use exactly as directed. Take your medicine at regular intervals. Do not take your medicine more often than directed. It is important that you put your used needles and syringes in a special sharps container. Do not put them in a trash can. If you do not have a sharps container, call your pharmacist or healthcare provider to get one. A special MedGuide will be given to you by the pharmacist with each prescription and refill. Be sure to read this information carefully each time. Talk to your pediatrician regarding the use of this medicine in children. While this medicine may be prescribed for children as young as 8 years for selected conditions, precautions do apply. Overdosage: If you think you have taken too much of this medicine contact a poison control center or emergency room at once. NOTE: This medicine is only for you. Do not share this medicine with others. What if  I miss a dose? If you miss a dose, take it as soon as you can. If it is almost time for your next dose, take only that dose. Do not take double or extra doses. What may interact with this medicine? Do not take this medicine with any of the following medications:  chasteberry This medicine may also interact with the following medications:  herbal or dietary supplements, like black cohosh or DHEA  male hormones, like estrogens or progestins and birth control pills, patches, rings, or injections  male hormones, like testosterone This list may not describe all possible interactions. Give your health care provider a list of all the medicines, herbs, non-prescription drugs, or dietary supplements you use. Also tell them if you smoke, drink alcohol, or use illegal drugs. Some items may interact with your medicine. What should I watch for while using this medicine? Visit your doctor or health care professional for regular checks on your progress. During the first week, your symptoms may get worse, but then will improve as you continue your treatment. You may get hot flashes, increased bone pain, increased difficulty passing urine, or an aggravation of nerve symptoms. Discuss these effects with your doctor or health care professional, some of them may improve with continued use of this medicine. Male patients may experience a menstrual cycle or spotting during the first 2 months of therapy with this medicine. If this continues, contact your doctor or health care professional. This medicine may increase blood sugar. Ask your healthcare provider if changes in diet or medicines are needed if   you have diabetes. What side effects may I notice from receiving this medicine? Side effects that you should report to your doctor or health care professional as soon as possible:  allergic reactions like skin rash, itching or hives, swelling of the face, lips, or tongue  breathing problems  chest  pain  depression or memory disorders  pain in your legs or groin  pain at site where injected  severe headache  signs and symptoms of high blood sugar such as being more thirsty or hungry or having to urinate more than normal. You may also feel very tired or have blurry vision  swelling of the feet and legs  visual changes  vomiting Side effects that usually do not require medical attention (report to your doctor or health care professional if they continue or are bothersome):  breast swelling or tenderness  decrease in sex drive or performance  diarrhea  hot flashes  loss of appetite  muscle, joint, or bone pains  nausea  redness or irritation at site where injected  skin problems or acne This list may not describe all possible side effects. Call your doctor for medical advice about side effects. You may report side effects to FDA at 1-800-FDA-1088. Where should I keep my medicine? Keep out of the reach of children. Store below 25 degrees C (77 degrees F). Do not freeze. Protect from light. Do not use if it is not clear or if there are particles present. Throw away any unused medicine after the expiration date. NOTE: This sheet is a summary. It may not cover all possible information. If you have questions about this medicine, talk to your doctor, pharmacist, or health care provider.  2020 Elsevier/Gold Standard (2017-12-12 09:52:48)  

## 2018-11-13 NOTE — Progress Notes (Signed)
Hematology and Oncology Follow Up Visit  [name] [id number] 02-09-1946 73 y.o. 11/13/2018 10:19 AM   Principle Diagnosis: 73 year old man with advanced prostate cancer with disease to the bone diagnosed 2018.  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  Prior Therapy:  1. He is status post a prostatectomy and lymph node dissection done on November 05, 2001.   2. S/P 6480 cGy of radiation therapy in 36 fractions between February and April 2005.   3. He developed advanced disease and was treated with Lupron in 2011.  His PSA nadir down to 4.11. PSA was up to 5.91 in December of 2012 with a testosterone level of 35. His staging workup did not reveal any bony metastasis.  4. Casodex was added to Lupron on 05/2011. This was discontinued in January 2016 due to progression of disease.  5. Zytiga 1000 mg daily started on 05/13/2014.  Therapy discontinued in December 2018 because of progression of disease. Bone scan of the time showed limited metastatic disease.  Current therapy:   Xtandi 160 mg daily started in December 2018.  Lupron 30 mg every 4 months.   Interim History: [name] returns today for a repeat evaluation.  Since the last visit, he reports no major changes in his health.  He continues to report some fatigue and tiredness associated with Xtandi.  He denies any nausea or abdominal pain.  He denies any back pain or hospitalizations.  His performance status and quality of life remains unchanged.  He denies any frequency or hematuria.  Patient denied any alteration mental status, neuropathy, confusion or dizziness.  Denies any headaches or lethargy.  Denies any night sweats, weight loss or changes in appetite.  Denied orthopnea, dyspnea on exertion or chest discomfort.  Denies shortness of breath, difficulty breathing hemoptysis or cough.  Denies any abdominal distention, nausea, early satiety or dyspepsia.  Denies  any hematuria, frequency, dysuria or nocturia.  Denies any skin irritation, dryness or rash.  Denies any ecchymosis or petechiae.  Denies any lymphadenopathy or clotting.  Denies any heat or cold intolerance.  Denies any anxiety or depression.  Remaining review of system is negative.         Medications: Without any changes on review..  Current Outpatient Medications  Medication Sig Dispense Refill  . ALPRAZolam (XANAX) 0.5 MG tablet Take 0.5 mg by mouth 3 (three) times daily as needed for sleep.    Marland Kitchen amLODipine (NORVASC) 5 MG tablet amlodipine 5 mg tablet  Take 1 tablet every day by oral route.    . Ascorbic Acid (VITAMIN C) 100 MG CHEW Vitamin C  1 once a day    . aspirin EC 81 MG tablet Take 81 mg by mouth daily.    . calcium-vitamin D (OSCAL 500/200 D-3) 500-200 MG-UNIT tablet Take 1 tablet by mouth daily with breakfast. 1 tablet   . Cholecalciferol (VITAMIN D3) 3000 units TABS cholecalciferol (vitamin D3) 1,000 unit tablet  1 tablet orally once a day    . Leuprolide Acetate (LUPRON DEPOT IM) Inject into the muscle every 4 (four) months.    . Multiple Vitamins-Minerals (MULTIVITAMIN ADULT EXTRA C PO) multivitamin  1 tablet orally once a day    . Omega-3 Fatty Acids (FISH OIL) 1000 MG CAPS Fish Oil  2 tablet po daily    . XTANDI 40 MG capsule TAKE 4 CAPSULES (160MG ) BY MOUTH DAILY 120 capsule 0   No current  facility-administered medications for this visit.     Allergies:  Allergies  Allergen Reactions  . Penicillins Rash and Other (See Comments)    Has patient had a PCN reaction causing immediate rash, facial/tongue/throat swelling, SOB or lightheadedness with hypotension: YES Has patient had a PCN reaction causing severe rash involving mucus membranes or skin necrosis: YES Has patient had a PCN reaction that required hospitalization: NO Has patient had a PCN reaction occurring within the last 10 years: NO     Past Medical History, Surgical history, Social history, and  Family History updated without changes.  Physical Exam:   Blood pressure (!) 141/81, pulse 62, temperature 97.8 F (36.6 C), temperature source Temporal, resp. rate 18, weight 236 lb 4.8 oz (107.2 kg), SpO2 100 %.    ECOG: 0   General appearance: Comfortable appearing without any discomfort Head: Normocephalic without any trauma Oropharynx: Mucous membranes are moist and pink without any thrush or ulcers. Eyes: Pupils are equal and round reactive to light. Lymph nodes: No cervical, supraclavicular, inguinal or axillary lymphadenopathy.   Heart:regular rate and rhythm.  S1 and S2 without leg edema. Lung: Clear without any rhonchi or wheezes.  No dullness to percussion. Abdomin: Soft, nontender, nondistended with good bowel sounds.  No hepatosplenomegaly. Musculoskeletal: No joint deformity or effusion.  Full range of motion noted. Neurological: No deficits noted on motor, sensory and deep tendon reflex exam. Skin: No petechial rash or dryness.  Appeared moist.         Lab Results: Lab Results  Component Value Date   WBC 7.5 11/11/2018   HGB 13.2 11/11/2018   HCT 39.9 11/11/2018   MCV 94.1 11/11/2018   PLT 201 11/11/2018     Chemistry      Component Value Date/Time   NA 139 11/11/2018 0857   NA 141 02/01/2017 0749   K 4.2 11/11/2018 0857   K 3.5 02/01/2017 0749   CL 105 11/11/2018 0857   CL 104 06/11/2012 0817   CO2 27 11/11/2018 0857   CO2 26 02/01/2017 0749   BUN 20 11/11/2018 0857   BUN 14.5 02/01/2017 0749   CREATININE 1.14 11/11/2018 0857   CREATININE 0.9 02/01/2017 0749      Component Value Date/Time   CALCIUM 9.0 11/11/2018 0857   CALCIUM 9.2 02/01/2017 0749   ALKPHOS 59 11/11/2018 0857   ALKPHOS 70 02/01/2017 0749   AST 14 (L) 11/11/2018 0857   AST 17 02/01/2017 0749   ALT 9 11/11/2018 0857   ALT 14 02/01/2017 0749   BILITOT 0.5 11/11/2018 0857   BILITOT 0.95 02/01/2017 0749        Results for Nichols, George WEMHOFF (MRN [id number]) as of  11/13/2018 10:21  Ref. Range 04/29/2017 10:50 06/18/2017 07:49 09/03/2017 08:11 12/31/2017 07:54 02/05/2018 08:12 05/08/2018 15:28 08/12/2018 08:31 11/11/2018 08:57  Prostate Specific Ag, Serum Latest Ref Range: 0.0 - 4.0 ng/mL 11.4 (H) 10.9 (H) 12.7 (H) 16.4 (H) 16.6 (H) 25.4 (H) 30.7 (H) 41.5 (H)    EXAM: CT ABDOMEN AND PELVIS WITH CONTRAST  TECHNIQUE: Multidetector CT imaging of the abdomen and pelvis was performed using the standard protocol following bolus administration of intravenous contrast.  CONTRAST:  170mL OMNIPAQUE IOHEXOL 300 MG/ML SOLN, additional oral enteric contrast  COMPARISON:  CT chest abdomen pelvis, 02/01/2017, nuclear whole-body bone scintigraphy, 02/05/2018  FINDINGS: Lower chest: No acute abnormality.  Hepatobiliary: No solid liver abnormality is seen. Multiple subcentimeter hypodense lesions of the liver, incompletely characterized although likely small cysts or hemangiomata unchanged  compared to prior examination. No gallstones, gallbladder wall thickening, or biliary dilatation.  Pancreas: Unremarkable. No pancreatic ductal dilatation or surrounding inflammatory changes.  Spleen: Normal in size without significant abnormality.  Adrenals/Urinary Tract: Adrenal glands are unremarkable. Exophytic cyst of the right kidney. Kidneys are otherwise normal, without renal calculi, solid lesion, or hydronephrosis. Bladder is unremarkable.  Stomach/Bowel: Stomach is within normal limits. Appendix appears normal. No evidence of bowel wall thickening, distention, or inflammatory changes.  Vascular/Lymphatic: No significant vascular findings are present. Significant interval enlargement of hypodense lymph nodes posterior to the inferior vena cava measuring 2.7 x 1.7 cm, previously 1.0 x 0.5 cm (series 2, image 30).  Reproductive: Status post prostatectomy.  Other: No abdominal wall hernia or abnormality. No abdominopelvic ascites.  Musculoskeletal:  There are multiple redemonstrated sclerotic osseous lesions, including of the right eighth rib (series 5, image 33), sclerotic lesion of the right ilium (series 5, image 68), small sclerotic lesions of the left ilium (series 5, image 68), and sclerotic vertebral lesions of the inferior endplate of 624THL (series 6, image 68) and the inferior aspect of L2 (series 6, image 58).  IMPRESSION: 1.  Status post prostatectomy.  2. Significant interval enlargement of hypodense lymph nodes posterior to the inferior vena cava measuring 2.7 x 1.7 cm, previously 1.0 x 0.5 cm (series 2, image 30), concerning for nodal metastatic disease.  3. Multiple redemonstrated sclerotic osseous lesions, unchanged compared to prior examination.  Study Result  CLINICAL DATA:  Prostate cancer.  EXAM: NUCLEAR MEDICINE WHOLE BODY BONE SCAN  TECHNIQUE: Whole body anterior and posterior images were obtained approximately 3 hours after intravenous injection of radiopharmaceutical.  RADIOPHARMACEUTICALS:  20.4 mCi Technetium-94m MDP IV  COMPARISON:  Whole-body bone scan 02/05/2018 and 02/14/2017.  FINDINGS: Multiple foci of uptake are redemonstrated. There is no significant interval change. No new lesions or significant progression is present.  Focal uptake is again noted in the left frontal or parietal skull. There is uptake at the T4-T5 level. Right posterior uptake is present at the next level down. Bilateral rib uptake is present. Asymmetric uptake is present in the right iliac bone and right paramedian posterior sacrum. Uptake at the MTP joint of the left foot is stable. Mild degenerative changes are noted in the knees.  IMPRESSION: Stable whole-body bone scan appearance of multiple metastatic foci. No significant interval change. No new lesions or significant progression.      Impression and Plan:  73 year old man with:  1.  Advanced prostate cancer with disease to the bone  diagnosed in 2018.    He has tolerated Xtandi without any major complications with reasonable PSA and disease control.  Laboratory data obtained on 11/11/2018 showed a rise in his PSA up to 41.5.  CT scan of the abdomen and pelvis obtained on 11/11/2018 showed an enlarged pelvic lymph node that could account for his disease progression. His bone disease is currently stable.  The natural course of this disease and treatment options were reviewed.  These options would include systemic chemotherapy versus continuing Xtandi and continued active surveillance.  Trudi Ida would be an option for his bone disease but would not help with his lymph node metastasis. PARP inhibitor if he harbors a mutation would be and also a reasonable option prior to chemotherapy.  Directed radiation therapy to his lymph node metastasis would also be an option given the limited disease progression noted at this time.  After discussion today, he is interested in learning about the potential for localized radiation to  his oligometastatic disease and in the meantime we will continue Xtandi.  2. Androgen deprivation.  He remains on Lupron and will be repeated today.  Long-term complications including hot flashes, weight gain and osteoporosis..  3.  Skeletal related events and bone directed therapy: I recommended continuing calcium and vitamin D supplements and will defer Xgeva for the time being.  4. Hypertension: Blood pressure remains under control at this time.  5. Follow up: Will be in 3 months for repeat evaluation and repeat PSA.  25  minutes was spent with the patient face-to-face today.  More than 50% of time was was dedicated to reviewing his disease status, treatment options answering questions regarding future plan of care.    Zola Button, MD 9/17/202010:19 AM

## 2018-11-14 ENCOUNTER — Telehealth: Payer: Self-pay | Admitting: Oncology

## 2018-11-14 ENCOUNTER — Ambulatory Visit
Admission: RE | Admit: 2018-11-14 | Discharge: 2018-11-14 | Disposition: A | Discharge status: Home / Self Care | Payer: Medicare Other | Source: Ambulatory Visit | Attending: Radiation Oncology | Admitting: Radiation Oncology

## 2018-11-14 ENCOUNTER — Encounter: Payer: Self-pay | Admitting: Radiation Oncology

## 2018-11-14 VITALS — Ht 75.0 in | Wt 236.0 lb

## 2018-11-14 DIAGNOSIS — C61 Malignant neoplasm of prostate: Secondary | ICD-10-CM

## 2018-11-14 DIAGNOSIS — C772 Secondary and unspecified malignant neoplasm of intra-abdominal lymph nodes: Secondary | ICD-10-CM

## 2018-11-14 HISTORY — DX: Personal history of irradiation: Z92.3

## 2018-11-14 HISTORY — DX: Malignant neoplasm of prostate: C61

## 2018-11-14 NOTE — Progress Notes (Signed)
Histology and Location of Primary Cancer: advanced prostate cancer with disease to the bone diagnosed 2003.  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  Location(s) of Symptomatic tumor(s): limited progression to pelvic lymph  Past/Anticipated chemotherapy by medical oncology, if any:  Prior Therapy:  1. He is status post a prostatectomy and lymph node dissection done on November 05, 2001.   2. S/P 6480 cGy of radiation therapy in 36 fractions between February and April 2005.   3. He developed advanced disease and was treated with Lupron in 2011.  His PSA nadir down to 4.11. PSA was up to 5.91 in December of 2012 with a testosterone level of 35. His staging workup did not reveal any bony metastasis.  4. Casodex was added to Lupron on 05/2011. This was discontinued in January 2016 due to progression of disease.  5. Zytiga 1000 mg daily started on 05/13/2014.  Therapy discontinued in December 2018 because of progression of disease. Bone scan of the time showed limited metastatic disease.  Current therapy:   Xtandi 160 mg daily started in December 2018.  Lupron 30 mg every 4 months. Received Lupron injection yesterday (9/17)  Patient's main complaints related to symptomatic tumor(s) are: none  Pain on a scale of 0-10 is: Patient reports tolerable intermittent left upper back pain and left chest pain "for years." Patient denies having to take OTC medication to manage this pain.     If Spine Met(s), symptoms, if any, include:  Bowel/Bladder retention or incontinence (please describe): denies  Numbness or weakness in extremities (please describe): denies  Current Decadron regimen, if applicable: denies  Ambulatory status? Walker? Wheelchair?: ambulatory   SAFETY ISSUES:  Prior radiation? Yes, under the care of Dr. Arloa Koh  Pacemaker/ICD? denies  Possible current pregnancy? No, male patient  Is the patient  on methotrexate? denies  Additional Complaints / other details:  73 year old male. Married with one son. Reports fatigue related to [place]. One younger brother with hx of thyroid ca. Another younger brother with throat ca. Deceased father with hx of skin ca.

## 2018-11-14 NOTE — Progress Notes (Signed)
Radiation Oncology         (336) [phone] ________________________________  Initial outpatient Consultation - Conducted via Telephone due to current COVID-19 concerns for limiting patient exposure  Name: [name] MRN: [id number]  Date: 11/14/2018  DOB: [birth year]  [id number], Joelene Millin, MD  [name], MD   REFERRING PHYSICIAN: [name], MD  DIAGNOSIS: 73 y.o. gentleman with metastatic, castrate resistant prostate cancer, s/p prostatectomy for Stage T3b N0, Gleason 4+3 with oligometastatic disease to the bones and lymph nodes    ICD-10-CM   1. Malignant neoplasm of prostate ([place])  C61   2. Prostate cancer metastatic to intraabdominal lymph node [facility])  C61    C77.2     HISTORY OF PRESENT ILLNESS: [name] is a 73 y.o. male with a diagnosis of metastatic, castrate resistant prostate cancer. He was initially diagnosed with Gleason 4+3 adenocarcinoma of the prostate with a PSA of 6.45 at the time of diagnosis in 2003.  He elected to proceed with radical prostatectomy with BPLND on 11/05/2001 with Dr. Risa Grill. Final pathology confirmed pT3bN0 with focal extracapsular extension and bilateral seminal vesicle involvement, but negative margins and lymph nodes. He initially had a good response but unfortunately, his PSA started rising, and he was treated with 64.8 Gy of salvage external beam radiation to the prostate fossa with Dr. Valere Dross in 2005.   He initially responded well but his PSA began rising once again, up to 35 in 09/2009, and he was started on Lupron ADT.  His PSA nadired at and to 3.17 in 05/2010 but was back up to 5.91 in 01/2011, despite castrate level testosterone of 35. He was referred to Dr. Alen Blew, and medical oncology, at that time and a staging bone scan in January 2013 was negative for bony metastasis.  Was was added in 04/2011 when his PSA had reached 10.53.  The PSA nadired at 1.8 in January 2014 but began rising again in April 2014.  He had a restaging  bone scan in April 2015 which was again negative for osseous metastasis with a PSA of 4.25 at that time.  The Casodex was discontinued in 02/2014 due to disease progression with a PSA up to 12.65 and then further elevated up to 37.26 in February 2016. A restaging CT C/A/P at that time did not show any evidence of visceral metastatic disease. [name] was initiated in 04/2014, with a PSA nadir of 1.29 in December 2016 but back up to 5.7 in December 2018.  Disease restaging scans were performed at that time with bone scan showing the development of limited osseous metastatic disease in the ribs and thoracic spine.  Zytiga was discontinued and his systemic therapy was changed to [facility] with an initial decrease in PSA to 9.0 in January 2019.  His PSA has continued to gradually rise despite Xtandi and Lupron every 4 months with his most recent PSA at 41.5 on 11/11/2018. Recent restaging scans were also performed on 11/11/2018.  CT A/P on 11/11/2018 showed significant interval enlargement of hypodense lymph nodes posterior to the inferior vena cava measuring 2.7 cm (up from 1 cm in 01/2017). Bone scan showed no significant interval change of multiple metastatic foci, with no new lesions.  The patient reviewed the PSA and imaging results with Dr. Alen Blew and he has kindly been referred today for discussion of potential radiation treatment options to the oligometastatic disease in the pelvic lymph nodes.   PREVIOUS RADIATION THERAPY: Yes  03/2003 - 05/2003: Prostate Fossa / 64.8  Gy in 36 fractions Valere Dross)  PAST MEDICAL HISTORY:  Past Medical History:  Diagnosis Date  . Hx of radiation therapy   . Prostate cancer ([place])       PAST SURGICAL HISTORY: Past Surgical History:  Procedure Laterality Date  . BURR HOLE Right 09/14/2015   Procedure: Right craniotomy for subdural ;  Surgeon: Kary Kos, MD;  Location: [facility];  Service: Neurosurgery;  Laterality: Right;  . PROSTATE BIOPSY    . PROSTATECTOMY       FAMILY HISTORY:  Family History  Problem Relation Age of Onset  . Skin cancer Father   . Thyroid cancer Brother   . Throat cancer Brother   . 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 Social History   Socioeconomic History  . Marital status: Married    Spouse name: Not on file  . Number of children: 1  . Years of education: Not on file  . Highest education level: Not on file  Occupational History  . Not on file  Social Needs  . Financial resource strain: Not on file  . Food insecurity    Worry: Not on file    Inability: Not on file  . Transportation needs    Medical: Not on file    Non-medical: Not on file  Tobacco Use  . Smoking status: Never Smoker  . Smokeless tobacco: Never Used  Substance and Sexual Activity  . Alcohol use: Yes    Comment: socially  . Drug use: No  . Sexual activity: Not Currently  Lifestyle  . Physical activity    Days per week: Not on file    Minutes per session: Not on file  . Stress: Not on file  Relationships  . Social Herbalist on phone: Not on file    Gets together: Not on file    Attends religious service: Not on file    Active member of club or organization: Not on file    Attends meetings of clubs or organizations: Not on file    Relationship status: Not on file  . Intimate partner violence    Fear of current or ex partner: Not on file    Emotionally abused: Not on file    Physically abused: Not on file    Forced sexual activity: Not on file  Other Topics Concern  . Not on file  Social History Narrative   Has one son.    ALLERGIES: Penicillins  MEDICATIONS:  Current Outpatient Medications  Medication Sig Dispense Refill  . ALPRAZolam (XANAX) 0.5 MG tablet Take 0.5 mg by mouth 3 (three) times daily as needed for sleep.    Marland Kitchen amLODipine (NORVASC) 5 MG tablet amlodipine 5 mg tablet  Take 1 tablet every day by oral route.    . Ascorbic Acid (VITAMIN C) 100 MG  CHEW Vitamin C  1 once a day    . aspirin EC 81 MG tablet Take 81 mg by mouth daily.    . Bergamot Oil OIL Bergamot  Take 1 or 2 capsules daily    . calcium-vitamin D (OSCAL 500/200 D-3) 500-200 MG-UNIT tablet Take 1 tablet by mouth daily with breakfast. 1 tablet   . Cholecalciferol (VITAMIN D3) 3000 units TABS cholecalciferol (vitamin D3) 1,000 unit tablet  1 tablet orally once a day    . Leuprolide Acetate (LUPRON DEPOT IM) Inject into the muscle every 4 (four) months.    Marland Kitchen  Multiple Vitamins-Minerals (MULTIVITAMIN ADULT PO) multivitamin  1 tablet orally once a day    . NON FORMULARY CBD oil 500 mg  Take 1 dropperful under tongue in the evening or at bedtime    . Omega-3 Fatty Acids (FISH OIL) 1000 MG CAPS Fish Oil  2 tablet po daily    . XTANDI 40 MG capsule TAKE 4 CAPSULES (160MG ) BY MOUTH DAILY 120 capsule 0   No current facility-administered medications for this encounter.     REVIEW OF SYSTEMS:  On review of systems, the patient reports that he is doing well overall. He denies any chest pain, shortness of breath, cough, fevers, chills, night sweats, or unintended weight changes. He denies any bowel disturbances, and denies abdominal pain, nausea or vomiting. He denies any new musculoskeletal or joint aches or pains. He reports chronic, tolerable, intermittent left upper back and left chest pain, which does not require pain medicine and remains unchanged recently. He is currently without complaints related to his cancer. He specifically denies bowel or bladder issues , paraesthesias or focal weakness in his extremities. He notes fatigue related to the [facility] but otherwise feels well overall.  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  A complete review of systems is obtained and is otherwise negative.    PHYSICAL EXAM:  Wt Readings from Last 3 Encounters:  11/14/18 236 lb (107 kg)  11/13/18 236 lb 4.8 oz (107.2 kg)  08/12/18 237 lb 2 oz (107.6  kg)   Temp Readings from Last 3 Encounters:  11/13/18 97.8 F (36.6 C) (Temporal)  08/12/18 (!) 97 F (36.1 C) (Oral)  05/08/18 97.9 F (36.6 C) (Oral)   BP Readings from Last 3 Encounters:  11/13/18 (!) 141/81  08/12/18 140/77  05/08/18 138/78   Pulse Readings from Last 3 Encounters:  11/13/18 62  08/12/18 66  05/08/18 65   Pain Assessment Pain Score: 0-No pain(see progress note)/10  Unable to assess due to telephone consult visit format.   KPS = 90  100 - Normal; no complaints; no evidence of disease. 90   - Able to carry on normal activity; minor signs or symptoms of disease. 80   - Normal activity with effort; some signs or symptoms of disease. 21   - Cares for self; unable to carry on normal activity or to do active work. 60   - Requires occasional assistance, but is able to care for most of his personal needs. 50   - Requires considerable assistance and frequent medical care. 55   - Disabled; requires special care and assistance. 40   - Severely disabled; hospital admission is indicated although death not imminent. 76   - Very sick; hospital admission necessary; active supportive treatment necessary. 10   - Moribund; fatal processes progressing rapidly. 0     - Dead  Karnofsky DA, Abelmann LaPorte, Craver LS and Burchenal [facility] [phone]) The use of the nitrogen mustards in the palliative treatment of carcinoma: with particular reference to bronchogenic carcinoma Cancer 1 634-56  LABORATORY DATA:  Lab Results  Component Value Date   WBC 7.5 11/11/2018   HGB 13.2 11/11/2018   HCT 39.9 11/11/2018   MCV 94.1 11/11/2018   PLT 201 11/11/2018   Lab Results  Component Value Date   NA 139 11/11/2018   K 4.2 11/11/2018   CL 105 11/11/2018   CO2 27 11/11/2018   Lab Results  Component Value Date   ALT 9 11/11/2018  AST 14 (L) 11/11/2018   ALKPHOS 59 11/11/2018   BILITOT 0.5 11/11/2018     RADIOGRAPHY: Nm Bone Scan Whole Body  Result Date: 11/12/2018 CLINICAL DATA:   Prostate cancer. EXAM: NUCLEAR MEDICINE WHOLE BODY BONE SCAN TECHNIQUE: Whole body anterior and posterior images were obtained approximately 3 hours after intravenous injection of radiopharmaceutical. RADIOPHARMACEUTICALS:  20.4 mCi Technetium-29m MDP IV COMPARISON:  Whole-body bone scan 02/05/2018 and 02/14/2017. FINDINGS: Multiple foci of uptake are redemonstrated. There is no significant interval change. No new lesions or significant progression is present. Focal uptake is again noted in the left frontal or parietal skull. There is uptake at the T4-T5 level. Right posterior uptake is present at the next level down. Bilateral rib uptake is present. Asymmetric uptake is present in the right iliac bone and right paramedian posterior sacrum. Uptake at the MTP joint of the left foot is stable. Mild degenerative changes are noted in the knees. IMPRESSION: Stable whole-body bone scan appearance of multiple metastatic foci. No significant interval change. No new lesions or significant progression. Electronically Signed   By: San Morelle M.D.   On: 11/12/2018 08:43   Ct Abdomen Pelvis W Contrast  Result Date: 11/11/2018 CLINICAL DATA:  Metastatic prostate cancer, assess treatment response EXAM: CT ABDOMEN AND PELVIS WITH CONTRAST TECHNIQUE: Multidetector CT imaging of the abdomen and pelvis was performed using the standard protocol following bolus administration of intravenous contrast. CONTRAST:  161mL OMNIPAQUE IOHEXOL 300 MG/ML SOLN, additional oral enteric contrast COMPARISON:  CT chest abdomen pelvis, 02/01/2017, nuclear whole-body bone scintigraphy, 02/05/2018 FINDINGS: Lower chest: No acute abnormality. Hepatobiliary: No solid liver abnormality is seen. Multiple subcentimeter hypodense lesions of the liver, incompletely characterized although likely small cysts or hemangiomata unchanged compared to prior examination. No gallstones, gallbladder wall thickening, or biliary dilatation. Pancreas:  Unremarkable. No pancreatic ductal dilatation or surrounding inflammatory changes. Spleen: Normal in size without significant abnormality. Adrenals/Urinary Tract: Adrenal glands are unremarkable. Exophytic cyst of the right kidney. Kidneys are otherwise normal, without renal calculi, solid lesion, or hydronephrosis. Bladder is unremarkable. Stomach/Bowel: Stomach is within normal limits. Appendix appears normal. No evidence of bowel wall thickening, distention, or inflammatory changes. Vascular/Lymphatic: No significant vascular findings are present. Significant interval enlargement of hypodense lymph nodes posterior to the inferior vena cava measuring 2.7 x 1.7 cm, previously 1.0 x 0.5 cm (series 2, image 30). Reproductive: Status post prostatectomy. Other: No abdominal wall hernia or abnormality. No abdominopelvic ascites. Musculoskeletal: There are multiple redemonstrated sclerotic osseous lesions, including of the right eighth rib (series 5, image 33), sclerotic lesion of the right ilium (series 5, image 68), small sclerotic lesions of the left ilium (series 5, image 68), and sclerotic vertebral lesions of the inferior endplate of 624THL (series 6, image 68) and the inferior aspect of L2 (series 6, image 58). IMPRESSION: 1.  Status post prostatectomy. 2. Significant interval enlargement of hypodense lymph nodes posterior to the inferior vena cava measuring 2.7 x 1.7 cm, previously 1.0 x 0.5 cm (series 2, image 30), concerning for nodal metastatic disease. 3. Multiple redemonstrated sclerotic osseous lesions, unchanged compared to prior examination. Electronically Signed   By: Eddie Candle M.D.   On: 11/11/2018 14:11      IMPRESSION/PLAN:  This visit was conducted via Telephone to spare the patient unnecessary potential exposure in the healthcare setting during the current COVID-19 pandemic.  1. 73 y.o. gentleman with metastatic, castrate resistant prostate cancer, s/p prostatectomy for Stage T3b N0, Gleason  4+3 with oligometastatic disease to the  bones and lymph nodes Today, we talked to the patient about the findings and workup thus far. We discussed the natural history of metastatic castrate resistant prostate carcinoma and general treatment, highlighting the role of radiotherapy in the management of oligometastatic disease. We discussed the available radiation techniques, and focused on the details of logistics and delivery as it pertains to stereotactic body radiotherapy (SBRT).  The recommendation is for a 3 fraction course of SBRT focused on the involved lymph nodes that are just posterior to the inferior vena cava.  We discussed that these appear to be outside of the typical treatment field for salvage prostate fossa radiotherapy but we will pull his old records for review and confirmation to assist with treatment planning.  We reviewed the anticipated acute and late sequelae associated with radiation in this setting. The patient was encouraged to ask questions that were answered to his satisfaction.  At the conclusion of the conversation, the patient is interested in moving forward with the recommended 3 fraction course of stereotactic body radiotherapy directed at the involved pelvic lymph nodes. We will share our discussion with Dr. Alen Blew and move forward with treatment planning in anticipation of beginning IMRT in the near future. He has provided verbal consent to proceed and is scheduled for CT simulation on 11/25/2018 with plans to begin treatment on 12/01/2018, following his scheduled musical performance on 11/29/2018.  Given current concerns for patient exposure during the COVID-19 pandemic, this encounter was conducted via telephone. The patient was notified in advance and was offered a MyChart meeting to allow for face to face communication but unfortunately reported that he did not have the appropriate resources/technology to support such a visit and instead preferred to proceed with telephone  consult. The patient has given verbal consent for this type of encounter. The time spent during this encounter was 60 minutes. The attendants for this meeting include Tyler Pita MD, Ashlyn Bruning PA-C, [place], patient, Ojani Farnam and his wife. During the encounter, Tyler Pita MD, Ashlyn Bruning PA-C, and scribe, Wilburn Mylar were located at [place].  Patient, Windell Binger and his wife were located at home.    Nicholos Johns, PA-C    Tyler Pita, MD  [facility] Direct Dial: [phone]  Fax: [phone] [email or web address]  Skype  [email or web address]  This document serves as a record of services personally performed by Tyler Pita, MD and Freeman Caldron, PA-C. It was created on their behalf by Wilburn Mylar, a trained medical scribe. The creation of this record is based on the scribe's personal observations and the provider's statements to them. This document has been checked and approved by the attending provider.

## 2018-11-14 NOTE — Progress Notes (Signed)
See progress note under physician encounter. 

## 2018-11-14 NOTE — Telephone Encounter (Signed)
Called and spoke with patient. Confirmed appt  °

## 2018-11-17 ENCOUNTER — Other Ambulatory Visit: Payer: Self-pay | Admitting: Oncology

## 2018-11-24 MED FILL — XTANDI 40 MG CAPSULE: 40 | 30 days supply | Qty: 120 | Fill #0

## 2018-11-25 ENCOUNTER — Ambulatory Visit
Admission: RE | Admit: 2018-11-25 | Discharge: 2018-11-25 | Disposition: A | Discharge status: Home / Self Care | Payer: Medicare Other | Source: Ambulatory Visit | Attending: Radiation Oncology | Admitting: Radiation Oncology

## 2018-11-25 ENCOUNTER — Other Ambulatory Visit: Payer: Self-pay

## 2018-11-25 DIAGNOSIS — C772 Secondary and unspecified malignant neoplasm of intra-abdominal lymph nodes: Secondary | ICD-10-CM | POA: Insufficient documentation

## 2018-11-25 DIAGNOSIS — Z510 Encounter for antineoplastic radiation therapy: Secondary | ICD-10-CM | POA: Insufficient documentation

## 2018-11-25 DIAGNOSIS — C61 Malignant neoplasm of prostate: Secondary | ICD-10-CM

## 2018-11-25 NOTE — Progress Notes (Signed)
  Radiation Oncology         (336) [phone] ________________________________  Name: [name] MRN: [id number]  Date: 11/25/2018  DOB: [birth year]  STEREOTACTIC BODY RADIOTHERAPY SIMULATION AND TREATMENT PLANNING NOTE    ICD-10-CM   1. Prostate cancer metastatic to intraabdominal lymph node ([facility])  C61    C77.2     DIAGNOSIS:  73 y.o. gentleman with stage IV metastatic, castrate resistant prostate cancer with isolated oligometastatic disease to paraaortic lymph nodes  NARRATIVE:  The patient was brought to the [place].  Identity was confirmed.  All relevant records and images related to the planned course of therapy were reviewed.  The patient freely provided informed written consent to proceed with treatment after reviewing the details related to the planned course of therapy. The consent form was witnessed and verified by the simulation staff.  Then, the patient was set-up in a stable reproducible  supine position for radiation therapy.  A BodyFix immobilization pillow was fabricated for reproducible positioning.  Surface markings were placed.  The CT images were loaded into the planning software.  The gross target volumes (GTV) and planning target volumes (PTV) were delinieated, and avoidance structures were contoured.  Treatment planning then occurred.  The radiation prescription was entered and confirmed.  A total of two complex treatment devices were fabricated in the form of the BodyFix immobilization pillow and a neck accuform cushion.  I have requested : 3D Simulation  I have requested a DVH of the following structures: targets and all normal structures near the target including left kidney, right kidney, spinal cord and target as noted on the radiation plan to maintain doses in adherence with established limits  PLAN:  The patient will receive 40 Gy in 5 fractions.  ________________________________  Sheral Apley Tammi Klippel, M.D.

## 2018-11-26 DIAGNOSIS — C772 Secondary and unspecified malignant neoplasm of intra-abdominal lymph nodes: Secondary | ICD-10-CM | POA: Diagnosis not present

## 2018-12-01 ENCOUNTER — Ambulatory Visit
Admission: RE | Admit: 2018-12-01 | Discharge: 2018-12-01 | Disposition: A | Discharge status: Home / Self Care | Payer: Medicare Other | Source: Ambulatory Visit | Attending: Radiation Oncology | Admitting: Radiation Oncology

## 2018-12-01 ENCOUNTER — Other Ambulatory Visit: Payer: Self-pay

## 2018-12-01 DIAGNOSIS — C772 Secondary and unspecified malignant neoplasm of intra-abdominal lymph nodes: Secondary | ICD-10-CM | POA: Insufficient documentation

## 2018-12-01 DIAGNOSIS — Z510 Encounter for antineoplastic radiation therapy: Secondary | ICD-10-CM | POA: Diagnosis not present

## 2018-12-01 DIAGNOSIS — C61 Malignant neoplasm of prostate: Secondary | ICD-10-CM | POA: Insufficient documentation

## 2018-12-03 ENCOUNTER — Other Ambulatory Visit: Payer: Self-pay

## 2018-12-03 ENCOUNTER — Ambulatory Visit
Admission: RE | Admit: 2018-12-03 | Discharge: 2018-12-03 | Disposition: A | Discharge status: Home / Self Care | Payer: Medicare Other | Source: Ambulatory Visit | Attending: Radiation Oncology | Admitting: Radiation Oncology

## 2018-12-03 DIAGNOSIS — C772 Secondary and unspecified malignant neoplasm of intra-abdominal lymph nodes: Secondary | ICD-10-CM | POA: Diagnosis not present

## 2018-12-05 ENCOUNTER — Ambulatory Visit
Admission: RE | Admit: 2018-12-05 | Discharge: 2018-12-05 | Disposition: A | Discharge status: Home / Self Care | Payer: Medicare Other | Source: Ambulatory Visit | Attending: Radiation Oncology | Admitting: Radiation Oncology

## 2018-12-05 ENCOUNTER — Other Ambulatory Visit: Payer: Self-pay

## 2018-12-05 DIAGNOSIS — C772 Secondary and unspecified malignant neoplasm of intra-abdominal lymph nodes: Secondary | ICD-10-CM | POA: Diagnosis not present

## 2018-12-09 ENCOUNTER — Ambulatory Visit
Admission: RE | Admit: 2018-12-09 | Discharge: 2018-12-09 | Disposition: A | Discharge status: Home / Self Care | Payer: Medicare Other | Source: Ambulatory Visit | Attending: Radiation Oncology | Admitting: Radiation Oncology

## 2018-12-09 ENCOUNTER — Other Ambulatory Visit: Payer: Self-pay

## 2018-12-09 DIAGNOSIS — C772 Secondary and unspecified malignant neoplasm of intra-abdominal lymph nodes: Secondary | ICD-10-CM | POA: Diagnosis not present

## 2018-12-11 ENCOUNTER — Encounter: Payer: Self-pay | Admitting: Radiation Oncology

## 2018-12-11 ENCOUNTER — Ambulatory Visit
Admission: RE | Admit: 2018-12-11 | Discharge: 2018-12-11 | Disposition: A | Discharge status: Home / Self Care | Payer: Medicare Other | Source: Ambulatory Visit | Attending: Radiation Oncology | Admitting: Radiation Oncology

## 2018-12-11 ENCOUNTER — Other Ambulatory Visit: Payer: Self-pay

## 2018-12-11 DIAGNOSIS — C772 Secondary and unspecified malignant neoplasm of intra-abdominal lymph nodes: Secondary | ICD-10-CM | POA: Diagnosis not present

## 2018-12-18 ENCOUNTER — Other Ambulatory Visit: Payer: Self-pay | Admitting: Oncology

## 2018-12-22 MED FILL — XTANDI 40 MG CAPSULE: 40 | 30 days supply | Qty: 120 | Fill #0

## 2019-01-09 NOTE — Progress Notes (Signed)
  Radiation Oncology         [phone]) [phone] ________________________________  Name: [name] MRN: [id number]  Date: 12/11/2018  DOB: [birth year]  End of Treatment Note  Diagnosis:   73 y.o. gentleman with metastatic, castrate resistant prostate cancer, s/p prostatectomy for Stage T3b N0, Gleason 4+3 with oligometastatic disease to the bones and lymph nodes     Indication for treatment:  Curative       Radiation treatment dates:   12/01/2018 - 12/11/2018  Site/dose:   Abdominal Lymph Nodes / 40 Gy in 5 fractions  Beams/energy:   SBRT/IMRT / 6X-FFF  Narrative: The patient tolerated radiation treatment relatively well. No acute side effects were noted.   Plan: The patient has completed radiation treatment. The patient will return to radiation oncology clinic for routine followup in one month. I advised him to call or return sooner if he has any questions or concerns related to his recovery or treatment. ________________________________  Sheral Apley. Tammi Klippel, M.D.   This document serves as a record of services personally performed by Tyler Pita, MD. It was created on his behalf by Wilburn Mylar, a trained medical scribe. The creation of this record is based on the scribe's personal observations and the provider's statements to them. This document has been checked and approved by the attending provider.

## 2019-01-13 ENCOUNTER — Other Ambulatory Visit: Payer: Medicare Other

## 2019-01-13 ENCOUNTER — Other Ambulatory Visit: Payer: Self-pay | Admitting: Oncology

## 2019-01-15 ENCOUNTER — Ambulatory Visit: Payer: Medicare Other | Admitting: Oncology

## 2019-01-16 MED FILL — XTANDI 40 MG CAPSULE: 40 | 30 days supply | Qty: 120 | Fill #0

## 2019-01-30 ENCOUNTER — Telehealth: Payer: Self-pay | Admitting: Oncology

## 2019-01-30 NOTE — Telephone Encounter (Signed)
Called patient per providers request to convert 12/17 appointment to a virtual visit, left a voicemail.

## 2019-02-10 ENCOUNTER — Other Ambulatory Visit: Payer: Self-pay

## 2019-02-10 ENCOUNTER — Inpatient Hospital Stay: Payer: Medicare Other | Attending: Oncology

## 2019-02-10 DIAGNOSIS — C61 Malignant neoplasm of prostate: Secondary | ICD-10-CM

## 2019-02-10 LAB — CBC WITH DIFFERENTIAL (CANCER CENTER ONLY)
Abs Immature Granulocytes: 0.02 10*3/uL (ref 0.00–0.07)
Basophils Absolute: 0 10*3/uL (ref 0.0–0.1)
Basophils Relative: 0 %
Eosinophils Absolute: 0.2 10*3/uL (ref 0.0–0.5)
Eosinophils Relative: 2 %
HCT: 40.1 % (ref 39.0–52.0)
Hemoglobin: 13.2 g/dL (ref 13.0–17.0)
Immature Granulocytes: 0 %
Lymphocytes Relative: 23 %
Lymphs Abs: 1.6 10*3/uL (ref 0.7–4.0)
MCH: 30.9 pg (ref 26.0–34.0)
MCHC: 32.9 g/dL (ref 30.0–36.0)
MCV: 93.9 fL (ref 80.0–100.0)
Monocytes Absolute: 0.5 10*3/uL (ref 0.1–1.0)
Monocytes Relative: 7 %
Neutro Abs: 4.7 10*3/uL (ref 1.7–7.7)
Neutrophils Relative %: 68 %
Platelet Count: 186 10*3/uL (ref 150–400)
RBC: 4.27 MIL/uL (ref 4.22–5.81)
RDW: 12.6 % (ref 11.5–15.5)
WBC Count: 7 10*3/uL (ref 4.0–10.5)
nRBC: 0 % (ref 0.0–0.2)

## 2019-02-10 LAB — CMP (CANCER CENTER ONLY)
ALT: 9 U/L (ref 0–44)
AST: 15 U/L (ref 15–41)
Albumin: 3.5 g/dL (ref 3.5–5.0)
Alkaline Phosphatase: 61 U/L (ref 38–126)
Anion gap: 9 (ref 5–15)
BUN: 20 mg/dL (ref 8–23)
CO2: 28 mmol/L (ref 22–32)
Calcium: 8.9 mg/dL (ref 8.9–10.3)
Chloride: 104 mmol/L (ref 98–111)
Creatinine: 1 mg/dL (ref 0.61–1.24)
GFR, Est AFR Am: >60 mL/min (ref >60)
GFR, Estimated: >60 mL/min (ref >60)
Glucose, Bld: 95 mg/dL (ref 70–99)
Potassium: 4.5 mmol/L (ref 3.5–5.1)
Sodium: 141 mmol/L (ref 135–145)
Total Bilirubin: 0.4 mg/dL (ref 0.3–1.2)
Total Protein: 7 g/dL (ref 6.5–8.1)

## 2019-02-11 LAB — PROSTATE-SPECIFIC AG, SERUM (LABCORP): Prostate Specific Ag, Serum: 35.3 ng/mL — ABNORMAL HIGH (ref 0.0–4.0)

## 2019-02-12 ENCOUNTER — Inpatient Hospital Stay (HOSPITAL_BASED_OUTPATIENT_CLINIC_OR_DEPARTMENT_OTHER): Payer: Medicare Other | Admitting: Oncology

## 2019-02-12 ENCOUNTER — Telehealth: Payer: Self-pay | Admitting: Oncology

## 2019-02-12 DIAGNOSIS — C61 Malignant neoplasm of prostate: Secondary | ICD-10-CM | POA: Diagnosis not present

## 2019-02-12 NOTE — Telephone Encounter (Signed)
Scheduled appt per 12/17 los.  Spoke with pt and they are aware of the appt date and time.

## 2019-02-12 NOTE — Progress Notes (Signed)
Hematology and Oncology Follow Up for Telemedicine Visits  [name] 1945/10/14 73 y.o. 02/12/2019 8:44 AM [name], MDShelton, Joelene Millin, MD   I connected with [name] on 02/12/19 at  9:00 AM EST by video enabled telemedicine visit and verified that I am speaking with the correct person using two identifiers.   I discussed the limitations, risks, security and privacy concerns of performing an evaluation and management service by telemedicine and the availability of in-person appointments. I also discussed with the patient that there may be a patient responsible charge related to this service. The patient expressed understanding and agreed to proceed.  Other persons participating in the visit and their role in the encounter:   Patient's location: Home Provider's location: Office    Principle Diagnosis:  73 year old man with  castration-resistant prostate cancer with disease to the bone diagnosed in 2018.  He was diagnosed in 2003 with Gleason score 4 + 3 equals 7 and  PSA 6.45 localized disease at that time.  Prior Therapy:  1. He is status post a prostatectomy and lymph node dissection done on November 05, 2001.   2. S/P 6480 cGy of radiation therapy in 36 fractions between February and April 2005.   3. He developed advanced disease and was treated with Lupron in 2011.  His PSA nadir down to 4.11. PSA was up to 5.91 in December of 2012 with a testosterone level of 35. His staging workup did not reveal any bony metastasis.  4. Casodex was added to Lupron on 05/2011. This was discontinued in January 2016 due to progression of disease.  5. Zytiga 1000 mg daily started on 05/13/2014.  Therapy discontinued in December 2018 because of progression of disease. Bone scan of the time showed limited metastatic disease.  6.  He is status post radiation to pelvic lymph nodes between October 5 and December 11, 2018.  He received total of 40 Gray in 5  fractions.  Current therapy:   Xtandi 160 mg daily started in December 2018.  Lupron 30 mg every 4 months.    Interim History: [name] reports no major changes in his health.  He tolerated radiation therapy without any major complaints.  He did have some GI issues including nausea and indigestion which has resolved at this time.  He denies recent hospitalization or illnesses.  He denies any bone pain or pathological fractures.  Formal status and quality of life remains unchanged.    Medications: Reviewed without any changes. Current Outpatient Medications  Medication Sig Dispense Refill  . ALPRAZolam (XANAX) 0.5 MG tablet Take 0.5 mg by mouth 3 (three) times daily as needed for sleep.    Marland Kitchen amLODipine (NORVASC) 5 MG tablet amlodipine 5 mg tablet  Take 1 tablet every day by oral route.    . Ascorbic Acid (VITAMIN C) 100 MG CHEW Vitamin C  1 once a day    . aspirin EC 81 MG tablet Take 81 mg by mouth daily.    . Bergamot Oil OIL Bergamot  Take 1 or 2 capsules daily    . calcium-vitamin D (OSCAL 500/200 D-3) 500-200 MG-UNIT tablet Take 1 tablet by mouth daily with breakfast. 1 tablet   . Cholecalciferol (VITAMIN D3) 3000 units TABS cholecalciferol (vitamin D3) 1,000 unit tablet  1 tablet orally once a day    . Leuprolide Acetate (LUPRON DEPOT IM) Inject into the muscle every 4 (four) months.    . Multiple Vitamins-Minerals (MULTIVITAMIN ADULT PO) multivitamin  1 tablet orally  once a day    . NON FORMULARY CBD oil 500 mg  Take 1 dropperful under tongue in the evening or at bedtime    . Omega-3 Fatty Acids (FISH OIL) 1000 MG CAPS Fish Oil  2 tablet po daily    . XTANDI 40 MG capsule TAKE 4 CAPSULES (160MG ) BY MOUTH DAILY 120 capsule 0   No current facility-administered medications for this visit.     Allergies:  Allergies  Allergen Reactions  . Penicillins Rash and Other (See Comments)    Has patient had a PCN reaction causing immediate rash, facial/tongue/throat  swelling, SOB or lightheadedness with hypotension: YES Has patient had a PCN reaction causing severe rash involving mucus membranes or skin necrosis: YES Has patient had a PCN reaction that required hospitalization: NO Has patient had a PCN reaction occurring within the last 10 years: NO     Past Medical History, Surgical history, Social history, and Family History unchanged on review.   Lab Results: Lab Results  Component Value Date   WBC 7.0 02/10/2019   HGB 13.2 02/10/2019   HCT 40.1 02/10/2019   MCV 93.9 02/10/2019   PLT 186 02/10/2019     Chemistry      Component Value Date/Time   NA 141 02/10/2019 1115   NA 141 02/01/2017 0749   K 4.5 02/10/2019 1115   K 3.5 02/01/2017 0749   CL 104 02/10/2019 1115   CL 104 06/11/2012 0817   CO2 28 02/10/2019 1115   CO2 26 02/01/2017 0749   BUN 20 02/10/2019 1115   BUN 14.5 02/01/2017 0749   CREATININE 1.00 02/10/2019 1115   CREATININE 0.9 02/01/2017 0749      Component Value Date/Time   CALCIUM 8.9 02/10/2019 1115   CALCIUM 9.2 02/01/2017 0749   ALKPHOS 61 02/10/2019 1115   ALKPHOS 70 02/01/2017 0749   AST 15 02/10/2019 1115   AST 17 02/01/2017 0749   ALT 9 02/10/2019 1115   ALT 14 02/01/2017 0749   BILITOT 0.4 02/10/2019 1115   BILITOT 0.95 02/01/2017 0749     Results for [name] (MRN [id number]) as of 02/12/2019 08:42  Ref. Range 11/11/2018 08:57 02/10/2019 11:15  Prostate Specific Ag, Serum Latest Ref Range: 0.0 - 4.0 ng/mL 41.5 (H) 35.3 (H)      Impression and Plan:  73 year old man with:  1.    Castration-resistant prostate cancer with disease to the bone and lymphadenopathy diagnosed in 2018.   Is currently on Xtandi with reasonable overall disease control although he has progressed with increased abdominal adenopathy that was radiated in October 2020.  His PSA is showing some mild decrease which indicates possible response to therapy.  Treatment options moving forward were reviewed which includes  switching to systemic chemotherapy versus continuation of Xtandi.  Like to defer the option of chemotherapy unless he has more white disease progression.  He is agreeable to continue at this time.  2. Androgen deprivation.   His next Lupron will be in January 2021.  Long-term complications including hot flashes and weight gain were reiterated.  3.  Bone directed therapy: He has limited bone disease at this time.  Recommended calcium and vitamin D supplements and deferred Xgeva for the time being.  4. Hypertension:  Blood pressure is manageable with amlodipine.  5. Follow up:  Will be in 2 months for repeat evaluation.   I discussed the assessment and treatment plan with the patient. The patient was provided an opportunity to ask questions  and all were answered. The patient agreed with the plan and demonstrated an understanding of the instructions.   The patient was advised to call back or seek an in-person evaluation if the symptoms worsen or if the condition fails to improve as anticipated.  I provided 25 minutes of face-to-face video visit time during this encounter, and > 50% was dedicated to reviewing his disease status, reviewing laboratory data as well as coordinating plan of care.  Zola Button, MD 02/12/2019 8:44 AM

## 2019-02-13 ENCOUNTER — Other Ambulatory Visit: Payer: Self-pay | Admitting: Oncology

## 2019-02-17 MED FILL — XTANDI 40 MG CAPSULE: 40 | 30 days supply | Qty: 120 | Fill #0

## 2019-03-12 ENCOUNTER — Other Ambulatory Visit: Payer: Self-pay | Admitting: Oncology

## 2019-03-16 MED FILL — XTANDI 40 MG CAPSULE: 40 | 30 days supply | Qty: 120 | Fill #0

## 2019-03-23 ENCOUNTER — Ambulatory Visit: Payer: Medicare Other | Attending: Internal Medicine

## 2019-03-23 DIAGNOSIS — Z23 Encounter for immunization: Secondary | ICD-10-CM | POA: Insufficient documentation

## 2019-03-23 NOTE — Progress Notes (Signed)
   Covid-19 Vaccination Clinic  Name:  [name]    MRN: [id number] DOB: 1945/05/03  03/23/2019  [name] was observed post Covid-19 immunization for 15 minutes without incidence. He was provided with Vaccine Information Sheet and instruction to access the V-Safe system.   [name] was instructed to call 911 with any severe reactions post vaccine: Marland Kitchen Difficulty breathing  . Swelling of your face and throat  . A fast heartbeat  . A bad rash all over your body  . Dizziness and weakness    Immunizations Administered    Name Date Dose VIS Date Route   Pfizer COVID-19 Vaccine 03/23/2019  2:07 PM 0.3 mL 02/06/2019 Intramuscular   Manufacturer: [place]   Lot: [id number]   [place]: [id number]

## 2019-04-09 ENCOUNTER — Other Ambulatory Visit: Payer: Self-pay | Admitting: Oncology

## 2019-04-13 ENCOUNTER — Ambulatory Visit: Payer: Medicare Other | Attending: Internal Medicine

## 2019-04-13 ENCOUNTER — Ambulatory Visit: Payer: Medicare Other

## 2019-04-13 DIAGNOSIS — Z23 Encounter for immunization: Secondary | ICD-10-CM | POA: Insufficient documentation

## 2019-04-13 NOTE — Progress Notes (Signed)
   Covid-19 Vaccination Clinic  Name:  [name]    MRN: [id number] DOB: 07/04/1945  04/13/2019  Mr. [name] was observed post Covid-19 immunization for 15 minutes without incidence. He was provided with Vaccine Information Sheet and instruction to access the V-Safe system.   Mr. [name] was instructed to call 911 with any severe reactions post vaccine: Marland Kitchen Difficulty breathing  . Swelling of your face and throat  . A fast heartbeat  . A bad rash all over your body  . Dizziness and weakness    Immunizations Administered    Name Date Dose VIS Date Route   Pfizer COVID-19 Vaccine 04/13/2019 12:31 PM 0.3 mL 02/06/2019 Intramuscular   Manufacturer: [place]   Lot: [id number]   [place]: [id number]

## 2019-04-17 MED FILL — XTANDI 40 MG CAPSULE: 40 | 30 days supply | Qty: 120 | Fill #0

## 2019-04-21 ENCOUNTER — Inpatient Hospital Stay: Payer: Medicare Other | Attending: Oncology

## 2019-04-21 ENCOUNTER — Other Ambulatory Visit: Payer: Self-pay

## 2019-04-21 DIAGNOSIS — Z79818 Long term (current) use of other agents affecting estrogen receptors and estrogen levels: Secondary | ICD-10-CM | POA: Insufficient documentation

## 2019-04-21 DIAGNOSIS — C7951 Secondary malignant neoplasm of bone: Secondary | ICD-10-CM | POA: Diagnosis not present

## 2019-04-21 DIAGNOSIS — C61 Malignant neoplasm of prostate: Secondary | ICD-10-CM

## 2019-04-21 DIAGNOSIS — Z79899 Other long term (current) drug therapy: Secondary | ICD-10-CM | POA: Insufficient documentation

## 2019-04-21 DIAGNOSIS — I10 Essential (primary) hypertension: Secondary | ICD-10-CM | POA: Diagnosis not present

## 2019-04-21 DIAGNOSIS — Z7982 Long term (current) use of aspirin: Secondary | ICD-10-CM | POA: Insufficient documentation

## 2019-04-21 DIAGNOSIS — Z923 Personal history of irradiation: Secondary | ICD-10-CM | POA: Insufficient documentation

## 2019-04-21 LAB — CBC WITH DIFFERENTIAL (CANCER CENTER ONLY)
Abs Immature Granulocytes: 0.02 10*3/uL (ref 0.00–0.07)
Basophils Absolute: 0 10*3/uL (ref 0.0–0.1)
Basophils Relative: 0 %
Eosinophils Absolute: 0.3 10*3/uL (ref 0.0–0.5)
Eosinophils Relative: 4 %
HCT: 39.1 % (ref 39.0–52.0)
Hemoglobin: 12.9 g/dL — ABNORMAL LOW (ref 13.0–17.0)
Immature Granulocytes: 0 %
Lymphocytes Relative: 31 %
Lymphs Abs: 2.1 10*3/uL (ref 0.7–4.0)
MCH: 31.2 pg (ref 26.0–34.0)
MCHC: 33 g/dL (ref 30.0–36.0)
MCV: 94.4 fL (ref 80.0–100.0)
Monocytes Absolute: 0.4 10*3/uL (ref 0.1–1.0)
Monocytes Relative: 6 %
Neutro Abs: 3.9 10*3/uL (ref 1.7–7.7)
Neutrophils Relative %: 59 %
Platelet Count: 192 10*3/uL (ref 150–400)
RBC: 4.14 MIL/uL — ABNORMAL LOW (ref 4.22–5.81)
RDW: 12.2 % (ref 11.5–15.5)
WBC Count: 6.7 10*3/uL (ref 4.0–10.5)
nRBC: 0 % (ref 0.0–0.2)

## 2019-04-21 LAB — CMP (CANCER CENTER ONLY)
ALT: 11 U/L (ref 0–44)
AST: 12 U/L — ABNORMAL LOW (ref 15–41)
Albumin: 3.4 g/dL — ABNORMAL LOW (ref 3.5–5.0)
Alkaline Phosphatase: 63 U/L (ref 38–126)
Anion gap: 7 (ref 5–15)
BUN: 23 mg/dL (ref 8–23)
CO2: 28 mmol/L (ref 22–32)
Calcium: 8.9 mg/dL (ref 8.9–10.3)
Chloride: 105 mmol/L (ref 98–111)
Creatinine: 1.06 mg/dL (ref 0.61–1.24)
GFR, Est AFR Am: >60 mL/min (ref >60)
GFR, Estimated: >60 mL/min (ref >60)
Glucose, Bld: 99 mg/dL (ref 70–99)
Potassium: 4.3 mmol/L (ref 3.5–5.1)
Sodium: 140 mmol/L (ref 135–145)
Total Bilirubin: 0.5 mg/dL (ref 0.3–1.2)
Total Protein: 6.9 g/dL (ref 6.5–8.1)

## 2019-04-22 LAB — PROSTATE-SPECIFIC AG, SERUM (LABCORP): Prostate Specific Ag, Serum: 39.4 ng/mL — ABNORMAL HIGH (ref 0.0–4.0)

## 2019-04-22 LAB — TESTOSTERONE: Testosterone: 29 ng/dL — ABNORMAL LOW (ref 264–916)

## 2019-04-23 ENCOUNTER — Inpatient Hospital Stay: Payer: Medicare Other

## 2019-04-23 ENCOUNTER — Other Ambulatory Visit: Payer: Self-pay

## 2019-04-23 ENCOUNTER — Inpatient Hospital Stay: Payer: Medicare Other | Admitting: Oncology

## 2019-04-23 ENCOUNTER — Telehealth: Payer: Self-pay | Admitting: Oncology

## 2019-04-23 VITALS — BP 143/73 | HR 71 | Temp 98.0°F | Resp 18 | Ht 75.0 in | Wt 244.9 lb

## 2019-04-23 DIAGNOSIS — C61 Malignant neoplasm of prostate: Secondary | ICD-10-CM | POA: Diagnosis not present

## 2019-04-23 MED ORDER — LEUPROLIDE ACETATE (4 MONTH) 30 MG ~~LOC~~ KIT
30.0000 mg | PACK | Freq: Once | SUBCUTANEOUS | Status: AC
  Administered 2019-04-23: 09:00:00 30 mg via SUBCUTANEOUS
  Filled 2019-04-23: qty 30

## 2019-04-23 NOTE — Progress Notes (Signed)
Hematology and Oncology Follow Up Visit  CRAY PREW [id number] 01-Jul-1945 74 y.o. 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  He was diagnosed in 2003 with Gleason score 4 + 3 equals 7 and  PSA 6.45.  Prior Therapy:  1. He is status post a prostatectomy and lymph node dissection done on November 05, 2001.   2. S/P 6480 cGy of radiation therapy in 36 fractions between February and April 2005.   3. He developed advanced disease and was treated with Lupron in 2011.  His PSA nadir down to 4.11. PSA was up to 5.91 in December of 2012 with a testosterone level of 35. His staging workup did not reveal any bony metastasis.  4. Casodex was added to Lupron on 05/2011. This was discontinued in January 2016 due to progression of disease.  5. Zytiga 1000 mg daily started on 05/13/2014.  Therapy discontinued in December 2018 because of progression of disease. Bone scan of the time showed limited metastatic disease.  6.  He is status post radiation to pelvic lymph nodes between October 5 and December 11, 2018.  He received total of 40 Gray in 5 fractions.  Current therapy:   Xtandi 160 mg daily started in December 2018.   Lupron 30 mg every 4 months.  He received Eligard in September 2020.  Interim History: [name] is here for a follow-up visit.  Since the last visit, he reports no major changes in his health.  Continues to feel well without any recent hospitalization or illnesses.  He denies any bone pain or pathological fractures.  He denies any urinary complaints.  He remains active and continues to attempt activities of daily living.          Medications: Without any changes on review.  Current Outpatient Medications  Medication Sig Dispense Refill  . ALPRAZolam (XANAX) 0.5 MG tablet Take 0.5 mg by mouth 3 (three) times daily as needed for sleep.    Marland Kitchen amLODipine  (NORVASC) 5 MG tablet amlodipine 5 mg tablet  Take 1 tablet every day by oral route.    . Ascorbic Acid (VITAMIN C) 100 MG CHEW Vitamin C  1 once a day    . aspirin EC 81 MG tablet Take 81 mg by mouth daily.    . Bergamot Oil OIL Bergamot  Take 1 or 2 capsules daily    . calcium-vitamin D (OSCAL 500/200 D-3) 500-200 MG-UNIT tablet Take 1 tablet by mouth daily with breakfast. 1 tablet   . Cholecalciferol (VITAMIN D3) 3000 units TABS cholecalciferol (vitamin D3) 1,000 unit tablet  1 tablet orally once a day    . Leuprolide Acetate (LUPRON DEPOT IM) Inject into the muscle every 4 (four) months.    . Multiple Vitamins-Minerals (MULTIVITAMIN ADULT PO) multivitamin  1 tablet orally once a day    . NON FORMULARY CBD oil 500 mg  Take 1 dropperful under tongue in the evening or at bedtime    . Omega-3 Fatty Acids (FISH OIL) 1000 MG CAPS Fish Oil  2 tablet po daily    . XTANDI 40 MG capsule TAKE 4 CAPSULES (160MG ) BY MOUTH DAILY 120 capsule 0   No current facility-administered medications for this visit.    Allergies:  Allergies  Allergen Reactions  . Penicillins Rash and Other (See Comments)    Has patient had a PCN reaction causing immediate rash, facial/tongue/throat swelling, SOB or lightheadedness with  hypotension: YES Has patient had a PCN reaction causing severe rash involving mucus membranes or skin necrosis: YES Has patient had a PCN reaction that required hospitalization: NO Has patient had a PCN reaction occurring within the last 10 years: NO       Physical Exam:   Blood pressure (!) 143/73, pulse 71, temperature 98 F (36.7 C), temperature source Oral, resp. rate 18, height 6\' 3"  (1.905 m), weight 244 lb 14.4 oz (111.1 kg), SpO2 100 %.     ECOG: 0    General appearance: Alert, awake without any distress. Head: Atraumatic without abnormalities Oropharynx: Without any thrush or ulcers. Eyes: No scleral icterus. Lymph nodes: No lymphadenopathy noted in the cervical,  supraclavicular, or axillary nodes Heart:regular rate and rhythm, without any murmurs or gallops.   Lung: Clear to auscultation without any rhonchi, wheezes or dullness to percussion. Abdomin: Soft, nontender without any shifting dullness or ascites. Musculoskeletal: No clubbing or cyanosis. Neurological: No motor or sensory deficits. Skin: No rashes or lesions.         Lab Results: Lab Results  Component Value Date   WBC 6.7 04/21/2019   HGB 12.9 (L) 04/21/2019   HCT 39.1 04/21/2019   MCV 94.4 04/21/2019   PLT 192 04/21/2019     Chemistry      Component Value Date/Time   NA 140 04/21/2019 0746   NA 141 02/01/2017 0749   K 4.3 04/21/2019 0746   K 3.5 02/01/2017 0749   CL 105 04/21/2019 0746   CL 104 06/11/2012 0817   CO2 28 04/21/2019 0746   CO2 26 02/01/2017 0749   BUN 23 04/21/2019 0746   BUN 14.5 02/01/2017 0749   CREATININE 1.06 04/21/2019 0746   CREATININE 0.9 02/01/2017 0749      Component Value Date/Time   CALCIUM 8.9 04/21/2019 0746   CALCIUM 9.2 02/01/2017 0749   ALKPHOS 63 04/21/2019 0746   ALKPHOS 70 02/01/2017 0749   AST 12 (L) 04/21/2019 0746   AST 17 02/01/2017 0749   ALT 11 04/21/2019 0746   ALT 14 02/01/2017 0749   BILITOT 0.5 04/21/2019 0746   BILITOT 0.95 02/01/2017 0749      Results for Riedlinger, LATRON BINDA (MRN [id number]) as of 04/23/2019 08:19  Ref. Range 02/10/2019 11:15 04/21/2019 07:46  Prostate Specific Ag, Serum Latest Ref Range: 0.0 - 4.0 ng/mL 35.3 (H) 39.4 (H)     Impression and Plan:  74 year old man with:  1.  Castration-resistant prostate cancer with disease to the bone and pelvic adenopathy diagnosed in 2018.   He is currently on Xtandi without any major complications.  His PSA has been reasonably controlled although slowly rising since the last visit.  The natural course of this disease as well as alternative treatment options were reviewed.  These will be in the form of systemic chemotherapy for a PARP inhibitor if he  harbors the appropriate mutation.  I recommended continuing the same dose and schedule of Xtandi and  obtain next generation sequencing analysis on his peripheral blood flow for any appropriate targetable mutations.  He is agreeable with this plan although we will defer repeat imaging studies unless PSA starts to rise rapidly.  2. Androgen deprivation.  He will receive Eligard today and repeated in 4 months.  Long-term complications including osteoporosis and hot flashes were reviewed.  3.  Bone directed therapy: He has limited bone disease and I recommended calcium and vitamin D supplements.  [name] option will be deferred unless he progresses with  his bone metastasis.  4. Hypertension: Blood pressure remains adequately controlled at this time.  5. Follow up: Will be in 2 months for repeat evaluation.  30 minutes were dedicated to this encounter.  The time was spent on reviewing his disease status, reviewing laboratory data, treatment options and future plan of care discussion.  Zola Button, MD 2/25/20218:15 AM

## 2019-04-23 NOTE — Patient Instructions (Signed)

## 2019-04-23 NOTE — Telephone Encounter (Signed)
Scheduled appt per 2/25 los - gave patient AVS

## 2019-05-13 ENCOUNTER — Other Ambulatory Visit: Payer: Self-pay | Admitting: Oncology

## 2019-05-18 MED FILL — XTANDI 40 MG CAPSULE: 40 | 30 days supply | Qty: 120 | Fill #0

## 2019-06-10 ENCOUNTER — Other Ambulatory Visit: Payer: Self-pay | Admitting: Oncology

## 2019-06-12 MED FILL — XTANDI 40 MG CAPSULE: 40 | 30 days supply | Qty: 120 | Fill #0

## 2019-06-23 ENCOUNTER — Encounter: Payer: Self-pay | Admitting: Oncology

## 2019-06-23 ENCOUNTER — Inpatient Hospital Stay: Payer: Medicare Other | Attending: Oncology

## 2019-06-23 ENCOUNTER — Other Ambulatory Visit: Payer: Self-pay

## 2019-06-23 DIAGNOSIS — C61 Malignant neoplasm of prostate: Secondary | ICD-10-CM | POA: Diagnosis present

## 2019-06-23 LAB — CBC WITH DIFFERENTIAL (CANCER CENTER ONLY)
Abs Immature Granulocytes: 0.02 10*3/uL (ref 0.00–0.07)
Basophils Absolute: 0 10*3/uL (ref 0.0–0.1)
Basophils Relative: 1 %
Eosinophils Absolute: 0.3 10*3/uL (ref 0.0–0.5)
Eosinophils Relative: 4 %
HCT: 39.9 % (ref 39.0–52.0)
Hemoglobin: 13.2 g/dL (ref 13.0–17.0)
Immature Granulocytes: 0 %
Lymphocytes Relative: 29 %
Lymphs Abs: 1.9 10*3/uL (ref 0.7–4.0)
MCH: 31.6 pg (ref 26.0–34.0)
MCHC: 33.1 g/dL (ref 30.0–36.0)
MCV: 95.5 fL (ref 80.0–100.0)
Monocytes Absolute: 0.4 10*3/uL (ref 0.1–1.0)
Monocytes Relative: 7 %
Neutro Abs: 3.9 10*3/uL (ref 1.7–7.7)
Neutrophils Relative %: 59 %
Platelet Count: 194 10*3/uL (ref 150–400)
RBC: 4.18 MIL/uL — ABNORMAL LOW (ref 4.22–5.81)
RDW: 12.7 % (ref 11.5–15.5)
WBC Count: 6.6 10*3/uL (ref 4.0–10.5)
nRBC: 0 % (ref 0.0–0.2)

## 2019-06-23 LAB — CMP (CANCER CENTER ONLY)
ALT: 10 U/L (ref 0–44)
AST: 16 U/L (ref 15–41)
Albumin: 3.2 g/dL — ABNORMAL LOW (ref 3.5–5.0)
Alkaline Phosphatase: 66 U/L (ref 38–126)
Anion gap: 9 (ref 5–15)
BUN: 17 mg/dL (ref 8–23)
CO2: 24 mmol/L (ref 22–32)
Calcium: 8.9 mg/dL (ref 8.9–10.3)
Chloride: 106 mmol/L (ref 98–111)
Creatinine: 0.98 mg/dL (ref 0.61–1.24)
GFR, Est AFR Am: >60 mL/min (ref >60)
GFR, Estimated: >60 mL/min (ref >60)
Glucose, Bld: 116 mg/dL — ABNORMAL HIGH (ref 70–99)
Potassium: 4.2 mmol/L (ref 3.5–5.1)
Sodium: 139 mmol/L (ref 135–145)
Total Bilirubin: 0.5 mg/dL (ref 0.3–1.2)
Total Protein: 6.8 g/dL (ref 6.5–8.1)

## 2019-07-03 ENCOUNTER — Inpatient Hospital Stay: Payer: Medicare Other | Attending: Oncology | Admitting: Oncology

## 2019-07-03 ENCOUNTER — Other Ambulatory Visit: Payer: Self-pay

## 2019-07-03 VITALS — BP 126/74 | HR 78 | Temp 97.8°F | Resp 18 | Ht 75.0 in | Wt 240.0 lb

## 2019-07-03 DIAGNOSIS — Z923 Personal history of irradiation: Secondary | ICD-10-CM | POA: Diagnosis not present

## 2019-07-03 DIAGNOSIS — I10 Essential (primary) hypertension: Secondary | ICD-10-CM | POA: Diagnosis not present

## 2019-07-03 DIAGNOSIS — C61 Malignant neoplasm of prostate: Secondary | ICD-10-CM | POA: Diagnosis present

## 2019-07-03 DIAGNOSIS — Z79899 Other long term (current) drug therapy: Secondary | ICD-10-CM | POA: Diagnosis not present

## 2019-07-03 DIAGNOSIS — Z7982 Long term (current) use of aspirin: Secondary | ICD-10-CM | POA: Diagnosis not present

## 2019-07-03 DIAGNOSIS — C7951 Secondary malignant neoplasm of bone: Secondary | ICD-10-CM | POA: Insufficient documentation

## 2019-07-03 DIAGNOSIS — Z191 Hormone sensitive malignancy status: Secondary | ICD-10-CM | POA: Insufficient documentation

## 2019-07-03 NOTE — Progress Notes (Signed)
Hematology and Oncology Follow Up Visit  VLADISLAV WETZELL [id number] February 01, 1946 74 y.o. 07/03/2019 8:30 AM   Principle Diagnosis: 74 year old man with advanced prostate cancer diagnosed in 2018.  He has castration-resistant  disease to the bone and lymphadenopathy after presenting in 2003 with Gleason score 4 + 3 equals 7 and PSA 6.45.  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  Prior Therapy:  1. He is status post a prostatectomy and lymph node dissection done on November 05, 2001.   2. S/P 6480 cGy of radiation therapy in 36 fractions between February and April 2005.   3. He developed advanced disease and was treated with Lupron in 2011.  His PSA nadir down to 4.11. PSA was up to 5.91 in December of 2012 with a testosterone level of 35. His staging workup did not reveal any bony metastasis.  4. Casodex was added to Lupron on 05/2011. This was discontinued in January 2016 due to progression of disease.  5. Zytiga 1000 mg daily started on 05/13/2014.  Therapy discontinued in December 2018 because of progression of disease. Bone scan of the time showed limited metastatic disease.  6.  He is status post radiation to pelvic lymph nodes between October 5 and December 11, 2018.  He received total of 40 Gray in 5 fractions.  Current therapy:   Xtandi 160 mg daily started in December 2018.   Lupron 30 mg every 4 months.  He is currently on Eligard and last injection was in February 2021.  Interim History: Mr. Strojny returns today for a repeat evaluation.  Since the last visit, reports no major changes in his health.  He continues to perform activities of daily living without any decline.  Continues to be active and feels reasonably well.  Denies any nausea, vomiting or abdominal pain.  He denies any bone pain or pathological fractures.  Performance status and activity level remains excellent.          Medications: Updated on  review. Current Outpatient Medications  Medication Sig Dispense Refill  . ALPRAZolam (XANAX) 0.5 MG tablet Take 0.5 mg by mouth 3 (three) times daily as needed for sleep.    Marland Kitchen amLODipine (NORVASC) 5 MG tablet amlodipine 5 mg tablet  Take 1 tablet every day by oral route.    . Ascorbic Acid (VITAMIN C) 100 MG CHEW Vitamin C  1 once a day    . aspirin EC 81 MG tablet Take 81 mg by mouth daily.    . Bergamot Oil OIL Bergamot  Take 1 or 2 capsules daily    . calcium-vitamin D (OSCAL 500/200 D-3) 500-200 MG-UNIT tablet Take 1 tablet by mouth daily with breakfast. 1 tablet   . Cholecalciferol (VITAMIN D3) 3000 units TABS cholecalciferol (vitamin D3) 1,000 unit tablet  1 tablet orally once a day    . Leuprolide Acetate (LUPRON DEPOT IM) Inject into the muscle every 4 (four) months.    . Multiple Vitamins-Minerals (MULTIVITAMIN ADULT PO) multivitamin  1 tablet orally once a day    . NON FORMULARY CBD oil 500 mg  Take 1 dropperful under tongue in the evening or at bedtime    . Omega-3 Fatty Acids (FISH OIL) 1000 MG CAPS Fish Oil  2 tablet po daily    . XTANDI 40 MG capsule TAKE 4 CAPSULES (160MG ) BY MOUTH DAILY 120 capsule 0   No current facility-administered medications for this visit.    Allergies:  Allergies  Allergen  Reactions  . Penicillins Rash and Other (See Comments)    Has patient had a PCN reaction causing immediate rash, facial/tongue/throat swelling, SOB or lightheadedness with hypotension: YES Has patient had a PCN reaction causing severe rash involving mucus membranes or skin necrosis: YES Has patient had a PCN reaction that required hospitalization: NO Has patient had a PCN reaction occurring within the last 10 years: NO       Physical Exam:    Blood pressure 126/74, pulse 78, temperature 97.8 F (36.6 C), temperature source Temporal, resp. rate 18, height 6\' 3"  (1.905 m), weight 240 lb (108.9 kg), SpO2 100 %.     ECOG: 0    General appearance: Comfortable  appearing without any discomfort Head: Normocephalic without any trauma Oropharynx: Mucous membranes are moist and pink without any thrush or ulcers. Eyes: Pupils are equal and round reactive to light. Lymph nodes: No cervical, supraclavicular, inguinal or axillary lymphadenopathy.   Heart:regular rate and rhythm.  S1 and S2 without leg edema. Lung: Clear without any rhonchi or wheezes.  No dullness to percussion. Abdomin: Soft, nontender, nondistended with good bowel sounds.  No hepatosplenomegaly. Musculoskeletal: No joint deformity or effusion.  Full range of motion noted. Neurological: No deficits noted on motor, sensory and deep tendon reflex exam. Skin: No petechial rash or dryness.  Appeared moist.           Lab Results: Lab Results  Component Value Date   WBC 6.6 06/23/2019   HGB 13.2 06/23/2019   HCT 39.9 06/23/2019   MCV 95.5 06/23/2019   PLT 194 06/23/2019     Chemistry      Component Value Date/Time   NA 139 06/23/2019 0805   NA 141 02/01/2017 0749   K 4.2 06/23/2019 0805   K 3.5 02/01/2017 0749   CL 106 06/23/2019 0805   CL 104 06/11/2012 0817   CO2 24 06/23/2019 0805   CO2 26 02/01/2017 0749   BUN 17 06/23/2019 0805   BUN 14.5 02/01/2017 0749   CREATININE 0.98 06/23/2019 0805   CREATININE 0.9 02/01/2017 0749      Component Value Date/Time   CALCIUM 8.9 06/23/2019 0805   CALCIUM 9.2 02/01/2017 0749   ALKPHOS 66 06/23/2019 0805   ALKPHOS 70 02/01/2017 0749   AST 16 06/23/2019 0805   AST 17 02/01/2017 0749   ALT 10 06/23/2019 0805   ALT 14 02/01/2017 0749   BILITOT 0.5 06/23/2019 0805   BILITOT 0.95 02/01/2017 0749          Impression and Plan:  74 year old man with:  1.  Advanced prostate cancer with disease to the bone and lymphadenopathy diagnosed in 2018.  He has castration-resistant at this time.  The natural course of this disease was reviewed at this time.  Treatment options were reiterated.  Laboratory data obtained on 06/23/2019  showed no severe abnormalities.  Options of treatment is to continue Xtandi, proceeding to systemic chemotherapy to treat with off label therapy targeting NF1 mutation.  Guardant 360 analysis does show mutation that is targetable with trametinib as well as everolimus.  These options would be reasonable after a follow-up pending additional treatment for prostate cancer.   After discussion today, we opted to continue with Xtandi and repeat PSA in 2 months.  Options of therapy will be reiterated at that time.    2. Androgen deprivation.  Next Eligard injection will be in 2 months.  Complications including osteoporosis and hot flashes were reviewed.  3.  Bone directed  therapy: I recommended calcium and vitamin D supplements initially.  [name] will be used after obtaining dental clearance and if his cancer progresses.  4. Hypertension: His blood pressure remains under excellent control at this time..  5. Follow up: He will return in 2 months for repeat evaluation.  30 minutes were spent on this visit.  The time was dedicated to updating his disease status, reviewing treatment options reviewing laboratory data and future plan of care discussion.  Zola Button, MD 5/7/20218:30 AM

## 2019-07-08 ENCOUNTER — Other Ambulatory Visit: Payer: Self-pay | Admitting: Oncology

## 2019-07-08 ENCOUNTER — Telehealth: Payer: Self-pay | Admitting: Oncology

## 2019-07-08 NOTE — Telephone Encounter (Signed)
Scheduled per 05/07 los, patient has been called and notified. 

## 2019-08-05 ENCOUNTER — Other Ambulatory Visit: Payer: Self-pay | Admitting: Oncology

## 2019-08-05 DIAGNOSIS — C61 Malignant neoplasm of prostate: Secondary | ICD-10-CM

## 2019-08-05 MED ORDER — ENZALUTAMIDE 40 MG PO TABS: 160.0000 mg | ORAL_TABLET | Freq: Every day | ORAL | 0 refills | Status: DC

## 2019-08-05 NOTE — Addendum Note (Signed)
Addended by: Darl Pikes on: 08/05/2019 11:22 AM   Modules accepted: Orders

## 2019-08-10 MED FILL — XTANDI 40 MG TABS: 40 | 30 days supply | Qty: 120 | Fill #0

## 2019-08-11 LAB — GUARDANT 360

## 2019-08-12 ENCOUNTER — Other Ambulatory Visit: Payer: Self-pay

## 2019-08-12 ENCOUNTER — Inpatient Hospital Stay: Payer: Medicare Other | Attending: Oncology

## 2019-08-12 DIAGNOSIS — R232 Flushing: Secondary | ICD-10-CM | POA: Diagnosis not present

## 2019-08-12 DIAGNOSIS — G629 Polyneuropathy, unspecified: Secondary | ICD-10-CM | POA: Diagnosis not present

## 2019-08-12 DIAGNOSIS — Z79899 Other long term (current) drug therapy: Secondary | ICD-10-CM | POA: Diagnosis not present

## 2019-08-12 DIAGNOSIS — C61 Malignant neoplasm of prostate: Secondary | ICD-10-CM | POA: Insufficient documentation

## 2019-08-12 DIAGNOSIS — C7951 Secondary malignant neoplasm of bone: Secondary | ICD-10-CM | POA: Insufficient documentation

## 2019-08-12 DIAGNOSIS — Z923 Personal history of irradiation: Secondary | ICD-10-CM | POA: Diagnosis not present

## 2019-08-12 DIAGNOSIS — Z192 Hormone resistant malignancy status: Secondary | ICD-10-CM | POA: Insufficient documentation

## 2019-08-12 DIAGNOSIS — Z7901 Long term (current) use of anticoagulants: Secondary | ICD-10-CM | POA: Diagnosis not present

## 2019-08-12 DIAGNOSIS — Z7982 Long term (current) use of aspirin: Secondary | ICD-10-CM | POA: Diagnosis not present

## 2019-08-12 DIAGNOSIS — I10 Essential (primary) hypertension: Secondary | ICD-10-CM | POA: Diagnosis not present

## 2019-08-12 LAB — CMP (CANCER CENTER ONLY)
ALT: 9 U/L (ref 0–44)
AST: 14 U/L — ABNORMAL LOW (ref 15–41)
Albumin: 3.5 g/dL (ref 3.5–5.0)
Alkaline Phosphatase: 69 U/L (ref 38–126)
Anion gap: 7 (ref 5–15)
BUN: 19 mg/dL (ref 8–23)
CO2: 27 mmol/L (ref 22–32)
Calcium: 9.4 mg/dL (ref 8.9–10.3)
Chloride: 105 mmol/L (ref 98–111)
Creatinine: 1.12 mg/dL (ref 0.61–1.24)
GFR, Est AFR Am: >60 mL/min (ref >60)
GFR, Estimated: >60 mL/min (ref >60)
Glucose, Bld: 107 mg/dL — ABNORMAL HIGH (ref 70–99)
Potassium: 4.9 mmol/L (ref 3.5–5.1)
Sodium: 139 mmol/L (ref 135–145)
Total Bilirubin: 0.6 mg/dL (ref 0.3–1.2)
Total Protein: 7.2 g/dL (ref 6.5–8.1)

## 2019-08-12 LAB — CBC WITH DIFFERENTIAL (CANCER CENTER ONLY)
Abs Immature Granulocytes: 0.03 10*3/uL (ref 0.00–0.07)
Basophils Absolute: 0 10*3/uL (ref 0.0–0.1)
Basophils Relative: 1 %
Eosinophils Absolute: 0.1 10*3/uL (ref 0.0–0.5)
Eosinophils Relative: 2 %
HCT: 40.5 % (ref 39.0–52.0)
Hemoglobin: 13.6 g/dL (ref 13.0–17.0)
Immature Granulocytes: 1 %
Lymphocytes Relative: 27 %
Lymphs Abs: 1.7 10*3/uL (ref 0.7–4.0)
MCH: 31.9 pg (ref 26.0–34.0)
MCHC: 33.6 g/dL (ref 30.0–36.0)
MCV: 94.8 fL (ref 80.0–100.0)
Monocytes Absolute: 0.5 10*3/uL (ref 0.1–1.0)
Monocytes Relative: 8 %
Neutro Abs: 3.9 10*3/uL (ref 1.7–7.7)
Neutrophils Relative %: 61 %
Platelet Count: 181 10*3/uL (ref 150–400)
RBC: 4.27 MIL/uL (ref 4.22–5.81)
RDW: 12.7 % (ref 11.5–15.5)
WBC Count: 6.3 10*3/uL (ref 4.0–10.5)
nRBC: 0 % (ref 0.0–0.2)

## 2019-08-13 LAB — PROSTATE-SPECIFIC AG, SERUM (LABCORP): Prostate Specific Ag, Serum: 74.6 ng/mL — ABNORMAL HIGH (ref 0.0–4.0)

## 2019-08-14 ENCOUNTER — Encounter: Payer: Self-pay | Admitting: Oncology

## 2019-08-14 ENCOUNTER — Inpatient Hospital Stay: Payer: Medicare Other | Admitting: Oncology

## 2019-08-14 ENCOUNTER — Inpatient Hospital Stay: Payer: Medicare Other

## 2019-08-14 ENCOUNTER — Other Ambulatory Visit: Payer: Self-pay

## 2019-08-14 VITALS — BP 129/75 | HR 72 | Temp 97.5°F | Resp 18 | Ht 75.0 in | Wt 240.5 lb

## 2019-08-14 DIAGNOSIS — C61 Malignant neoplasm of prostate: Secondary | ICD-10-CM

## 2019-08-14 MED ORDER — LEUPROLIDE ACETATE (4 MONTH) 30 MG ~~LOC~~ KIT
30.0000 mg | PACK | Freq: Once | SUBCUTANEOUS | Status: AC
  Administered 2019-08-14: 30 mg via SUBCUTANEOUS

## 2019-08-14 MED ORDER — LEUPROLIDE ACETATE (4 MONTH) 30 MG ~~LOC~~ KIT
PACK | SUBCUTANEOUS | Status: AC
  Filled 2019-08-14: qty 30

## 2019-08-14 NOTE — Patient Instructions (Signed)

## 2019-08-14 NOTE — Progress Notes (Signed)
Hematology and Oncology Follow Up Visit  [name] [id number] 02/24/1946 74 y.o. 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  He was initially diagnosed with localized disease and 2003 with Gleason score 4 + 3 equals 7 and PSA 6.45.  Prior Therapy:  1. He is status post a prostatectomy and lymph node dissection done on November 05, 2001.   2. S/P 6480 cGy of radiation therapy in 36 fractions between February and April 2005.   3. He developed advanced disease and was treated with Lupron in 2011.  His PSA nadir down to 4.11. PSA was up to 5.91 in December of 2012 with a testosterone level of 35. His staging workup did not reveal any bony metastasis.  4. Casodex was added to Lupron on 05/2011. This was discontinued in January 2016 due to progression of disease.  5. Zytiga 1000 mg daily started on 05/13/2014.  Therapy discontinued in December 2018 because of progression of disease. Bone scan of the time showed limited metastatic disease.  6.  He is status post radiation to pelvic lymph nodes between October 5 and December 11, 2018.  He received total of 40 Gray in 5 fractions.  Current therapy:   Xtandi 160 mg daily started in December 2018.   Lupron 30 mg every 4 months.  He is currently on Eligard with next Eligard injection in June 2021.  Interim History: [name] is here for a follow-up visit.  Since the last visit, he reports no major changes in his health.  He denies any nausea, vomiting or abdominal pain.  He denies excessive fatigue or tiredness.  He denies any bone pain or pathological fractures.  His performance status and quality of life continues to be stable.  He denies any complications related to Xtandi at this time.  He denies any worsening edema or recent  hospitalizations.  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          Medications: Unchanged on review. Current Outpatient Medications  Medication Sig Dispense Refill  . ALPRAZolam (XANAX) 0.5 MG tablet Take 0.5 mg by mouth 3 (three) times daily as needed for sleep.    Marland Kitchen amLODipine (NORVASC) 5 MG tablet amlodipine 5 mg tablet  Take 1 tablet every day by oral route.    . Ascorbic Acid (VITAMIN C) 100 MG CHEW Vitamin C  1 once a day    . aspirin EC 81 MG tablet Take 81 mg by mouth daily.    . Bergamot Oil OIL Bergamot  Take 1 or 2 capsules daily    . calcium-vitamin D (OSCAL 500/200 D-3) 500-200 MG-UNIT tablet Take 1 tablet by mouth daily with breakfast. 1 tablet   . Cholecalciferol (VITAMIN D3) 3000 units TABS cholecalciferol (vitamin D3) 1,000 unit tablet  1 tablet orally once a day    . enzalutamide (XTANDI) 40 MG tablet Take 4 tablets (160 mg total) by mouth daily. 120 tablet 0  . Leuprolide Acetate (LUPRON DEPOT IM) Inject into the muscle every 4 (four) months.    . Multiple Vitamins-Minerals (MULTIVITAMIN ADULT PO) multivitamin  1 tablet orally once a day    . NON FORMULARY CBD oil 500 mg  Take 1 dropperful under tongue in the evening or at bedtime    . Omega-3 Fatty Acids (FISH OIL) 1000 MG CAPS  Fish Oil  2 tablet po daily     No current facility-administered medications for this visit.    Allergies:  Allergies  Allergen Reactions  . Penicillins Rash and Other (See Comments)    Has patient had a PCN reaction causing immediate rash, facial/tongue/throat swelling, SOB or lightheadedness with hypotension: YES Has patient had a PCN reaction causing severe rash involving mucus membranes or skin necrosis: YES Has patient had a PCN reaction that required hospitalization: NO Has patient had a PCN reaction occurring within the last 10 years: NO       Physical Exam:    Blood pressure 129/75, pulse 72, temperature (!) 97.5 F (36.4 C),  temperature source Temporal, resp. rate 18, height 6\' 3"  (1.905 m), weight 240 lb 8 oz (109.1 kg), SpO2 100 %.     ECOG: 0    General appearance: Alert, awake without any distress. Head: Atraumatic without abnormalities Oropharynx: Without any thrush or ulcers. Eyes: No scleral icterus. Lymph nodes: No lymphadenopathy noted in the cervical, supraclavicular, or axillary nodes Heart:regular rate and rhythm, without any murmurs or gallops.   Lung: Clear to auscultation without any rhonchi, wheezes or dullness to percussion. Abdomin: Soft, nontender without any shifting dullness or ascites. Musculoskeletal: No clubbing or cyanosis. Neurological: No motor or sensory deficits. Skin: No rashes or lesions.           Lab Results: Lab Results  Component Value Date   WBC 6.3 08/12/2019   HGB 13.6 08/12/2019   HCT 40.5 08/12/2019   MCV 94.8 08/12/2019   PLT 181 08/12/2019     Chemistry      Component Value Date/Time   NA 139 08/12/2019 1030   NA 141 02/01/2017 0749   K 4.9 08/12/2019 1030   K 3.5 02/01/2017 0749   CL 105 08/12/2019 1030   CL 104 06/11/2012 0817   CO2 27 08/12/2019 1030   CO2 26 02/01/2017 0749   BUN 19 08/12/2019 1030   BUN 14.5 02/01/2017 0749   CREATININE 1.12 08/12/2019 1030   CREATININE 0.9 02/01/2017 0749      Component Value Date/Time   CALCIUM 9.4 08/12/2019 1030   CALCIUM 9.2 02/01/2017 0749   ALKPHOS 69 08/12/2019 1030   ALKPHOS 70 02/01/2017 0749   AST 14 (L) 08/12/2019 1030   AST 17 02/01/2017 0749   ALT 9 08/12/2019 1030   ALT 14 02/01/2017 0749   BILITOT 0.6 08/12/2019 1030   BILITOT 0.95 02/01/2017 0749       Results for Ellingsen, [name] (MRN [id number]) as of 08/14/2019 12:23  Ref. Range 04/21/2019 07:46 08/12/2019 10:30  Prostate Specific Ag, Serum Latest Ref Range: 0.0 - 4.0 ng/mL 39.4 (H) 74.6 (H)     Impression and Plan:  74 year old man with:  1.  Castration-resistant prostate cancer with disease to the bone and  lymphadenopathy diagnosed in 2018.     He is currently on Xtandi without any major complications.  His PSA has increased from 39 up to 74 which indicates a progression of disease which has been gradual since September 2020.  Treatment options moving forward were reviewed.  These would include systemic chemotherapy, off label therapy for trametinib versus continuing Xtandi.  I would favor obtaining staging work-up including CT scan and a bone scan prior to making any treatment decisions.  At this time he is agreeable to have staging work-up prior to proceeding with any additional treatment.  He still adamant about avoiding systemic chemotherapy.  Continue to perform  his incision and fevers to complication associated with neuropathy   2. Androgen deprivation.  Last Eligard given in February 2021 and will receive it in June 2021.  Complications: Weight gain, hot flashes among others.  3.  Bone directed therapy: He is currently on calcium and vitamin D supplements which I recommended continuing.  4. Hypertension: Blood pressure is normal at this time I will continue to monitor on Xtandi.  5. Follow up: Will be in 3 months for repeat evaluation and updating his staging work-up.  30 minutes were dedicated to this encounter.  The time was spent on reviewing his laboratory data, discussing treatment options and complications related to therapy. Zola Button, MD 6/18/20211:01 PM

## 2019-09-16 ENCOUNTER — Other Ambulatory Visit: Payer: Self-pay | Admitting: Oncology

## 2019-09-16 DIAGNOSIS — C61 Malignant neoplasm of prostate: Secondary | ICD-10-CM

## 2019-09-21 MED FILL — XTANDI 40 MG TABS: 40 | 30 days supply | Qty: 120 | Fill #0

## 2019-11-10 ENCOUNTER — Encounter (HOSPITAL_COMMUNITY)
Admission: RE | Admit: 2019-11-10 | Discharge: 2019-11-10 | Disposition: A | Discharge status: Home / Self Care | Payer: Medicare Other | Source: Ambulatory Visit | Attending: Oncology | Admitting: Oncology

## 2019-11-10 ENCOUNTER — Ambulatory Visit (HOSPITAL_COMMUNITY)
Admission: RE | Admit: 2019-11-10 | Discharge: 2019-11-10 | Disposition: A | Discharge status: Home / Self Care | Payer: Medicare Other | Source: Ambulatory Visit | Attending: Oncology | Admitting: Oncology

## 2019-11-10 ENCOUNTER — Inpatient Hospital Stay: Payer: Medicare Other | Attending: Oncology

## 2019-11-10 ENCOUNTER — Other Ambulatory Visit: Payer: Self-pay

## 2019-11-10 ENCOUNTER — Encounter (HOSPITAL_COMMUNITY): Payer: Self-pay

## 2019-11-10 DIAGNOSIS — Z923 Personal history of irradiation: Secondary | ICD-10-CM | POA: Diagnosis not present

## 2019-11-10 DIAGNOSIS — C7951 Secondary malignant neoplasm of bone: Secondary | ICD-10-CM | POA: Insufficient documentation

## 2019-11-10 DIAGNOSIS — C61 Malignant neoplasm of prostate: Secondary | ICD-10-CM

## 2019-11-10 DIAGNOSIS — Z192 Hormone resistant malignancy status: Secondary | ICD-10-CM | POA: Diagnosis not present

## 2019-11-10 DIAGNOSIS — I10 Essential (primary) hypertension: Secondary | ICD-10-CM | POA: Insufficient documentation

## 2019-11-10 LAB — CBC WITH DIFFERENTIAL (CANCER CENTER ONLY)
Abs Immature Granulocytes: 0.03 10*3/uL (ref 0.00–0.07)
Basophils Absolute: 0 10*3/uL (ref 0.0–0.1)
Basophils Relative: 0 %
Eosinophils Absolute: 0.2 10*3/uL (ref 0.0–0.5)
Eosinophils Relative: 3 %
HCT: 38.9 % — ABNORMAL LOW (ref 39.0–52.0)
Hemoglobin: 13 g/dL (ref 13.0–17.0)
Immature Granulocytes: 1 %
Lymphocytes Relative: 25 %
Lymphs Abs: 1.5 10*3/uL (ref 0.7–4.0)
MCH: 31 pg (ref 26.0–34.0)
MCHC: 33.4 g/dL (ref 30.0–36.0)
MCV: 92.6 fL (ref 80.0–100.0)
Monocytes Absolute: 0.4 10*3/uL (ref 0.1–1.0)
Monocytes Relative: 7 %
Neutro Abs: 4 10*3/uL (ref 1.7–7.7)
Neutrophils Relative %: 64 %
Platelet Count: 177 10*3/uL (ref 150–400)
RBC: 4.2 MIL/uL — ABNORMAL LOW (ref 4.22–5.81)
RDW: 12.7 % (ref 11.5–15.5)
WBC Count: 6.1 10*3/uL (ref 4.0–10.5)
nRBC: 0 % (ref 0.0–0.2)

## 2019-11-10 LAB — CMP (CANCER CENTER ONLY)
ALT: 8 U/L (ref 0–44)
AST: 14 U/L — ABNORMAL LOW (ref 15–41)
Albumin: 3.4 g/dL — ABNORMAL LOW (ref 3.5–5.0)
Alkaline Phosphatase: 101 U/L (ref 38–126)
Anion gap: 5 (ref 5–15)
BUN: 15 mg/dL (ref 8–23)
CO2: 30 mmol/L (ref 22–32)
Calcium: 9.4 mg/dL (ref 8.9–10.3)
Chloride: 103 mmol/L (ref 98–111)
Creatinine: 0.98 mg/dL (ref 0.61–1.24)
GFR, Est AFR Am: >60 mL/min (ref >60)
GFR, Estimated: >60 mL/min (ref >60)
Glucose, Bld: 110 mg/dL — ABNORMAL HIGH (ref 70–99)
Potassium: 4.3 mmol/L (ref 3.5–5.1)
Sodium: 138 mmol/L (ref 135–145)
Total Bilirubin: 0.6 mg/dL (ref 0.3–1.2)
Total Protein: 7 g/dL (ref 6.5–8.1)

## 2019-11-10 MED ORDER — IOHEXOL 300 MG/ML  SOLN
100.0000 mL | Freq: Once | INTRAMUSCULAR | Status: AC | PRN
  Administered 2019-11-10: 100 mL via INTRAVENOUS

## 2019-11-10 MED ORDER — TECHNETIUM TC 99M MEDRONATE IV KIT
20.7000 | PACK | Freq: Once | INTRAVENOUS | Status: AC
  Administered 2019-11-10: 20.7 via INTRAVENOUS

## 2019-11-11 LAB — PROSTATE-SPECIFIC AG, SERUM (LABCORP): Prostate Specific Ag, Serum: 137 ng/mL — ABNORMAL HIGH (ref 0.0–4.0)

## 2019-11-12 ENCOUNTER — Other Ambulatory Visit: Payer: Self-pay

## 2019-11-12 ENCOUNTER — Inpatient Hospital Stay: Payer: Medicare Other | Admitting: Oncology

## 2019-11-12 VITALS — BP 151/84 | HR 67 | Temp 98.4°F | Resp 18 | Wt 236.4 lb

## 2019-11-12 DIAGNOSIS — C61 Malignant neoplasm of prostate: Secondary | ICD-10-CM | POA: Diagnosis not present

## 2019-11-12 NOTE — Progress Notes (Signed)
Hematology and Oncology Follow Up Visit  [name] [id number] April 19, 1945 74 y.o. 11/12/2019 9:47 AM   Principle Diagnosis: 74 year old man with castration-resistant disease to the bone and lymphadenopathy documented in 2013.  He was initially diagnosed in 2003 with Gleason score 4 + 3 equals 7 and PSA 6.45.  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  Prior Therapy:  1. He is status post a prostatectomy and lymph node dissection done on November 05, 2001.   2. S/P 6480 cGy of radiation therapy in 36 fractions between February and April 2005.   3. He developed advanced disease and was treated with Lupron in 2011.  His PSA nadir down to 4.11. PSA was up to 5.91 in December of 2012 with a testosterone level of 35. His staging workup did not reveal any bony metastasis.  4. Casodex was added to Lupron on 05/2011. This was discontinued in January 2016 due to progression of disease.  5. Zytiga 1000 mg daily started on 05/13/2014.  Therapy discontinued in December 2018 because of progression of disease. Bone scan of the time showed limited metastatic disease.  6.  He is status post radiation to pelvic lymph nodes between October 5 and December 11, 2018.  He received total of 40 Gray in 5 fractions.  Current therapy:   Xtandi 160 mg daily started in December 2018.   Eligard 30 mg every 4 months.  Last injection given in June 2021.  Interim History: Mr. Berrett is here for a follow-up evaluation.  Since the last visit, he reports no major changes in his health.  He continues to feel well without any recent hospitalization or illnesses.  He denies any bone pain or pathological fractures.  Continues to be active and of lost some weight.  He continues to exercise regularly and has continued performing periodically as a musician.          Medications: Unchanged on review. Current Outpatient Medications  Medication Sig Dispense  Refill  . ALPRAZolam (XANAX) 0.5 MG tablet Take 0.5 mg by mouth 3 (three) times daily as needed for sleep.    Marland Kitchen amLODipine (NORVASC) 5 MG tablet amlodipine 5 mg tablet  Take 1 tablet every day by oral route.    . Ascorbic Acid (VITAMIN C) 100 MG CHEW Vitamin C  1 once a day    . aspirin EC 81 MG tablet Take 81 mg by mouth daily.    . Bergamot Oil OIL Bergamot  Take 1 or 2 capsules daily    . calcium-vitamin D (OSCAL 500/200 D-3) 500-200 MG-UNIT tablet Take 1 tablet by mouth daily with breakfast. 1 tablet   . Cholecalciferol (VITAMIN D3) 3000 units TABS cholecalciferol (vitamin D3) 1,000 unit tablet  1 tablet orally once a day    . Leuprolide Acetate (LUPRON DEPOT IM) Inject into the muscle every 4 (four) months.    . Multiple Vitamins-Minerals (MULTIVITAMIN ADULT PO) multivitamin  1 tablet orally once a day    . NON FORMULARY CBD oil 500 mg  Take 1 dropperful under tongue in the evening or at bedtime    . Omega-3 Fatty Acids (FISH OIL) 1000 MG CAPS Fish Oil  2 tablet po daily    . XTANDI 40 MG tablet TAKE 4 TABLETS (160 MG TOTAL) BY MOUTH DAILY. 120 tablet 0   No current facility-administered medications for this visit.    Allergies:  Allergies  Allergen Reactions  . Penicillins Rash and Other (See  Comments)    Has patient had a PCN reaction causing immediate rash, facial/tongue/throat swelling, SOB or lightheadedness with hypotension: YES Has patient had a PCN reaction causing severe rash involving mucus membranes or skin necrosis: YES Has patient had a PCN reaction that required hospitalization: NO Has patient had a PCN reaction occurring within the last 10 years: NO       Physical Exam:     Blood pressure (!) 151/84, pulse 67, temperature 98.4 F (36.9 C), temperature source Oral, resp. rate 18, weight 236 lb 6.4 oz (107.2 kg), SpO2 100 %.     ECOG: 1   General appearance: Alert, awake without any distress. Head: Atraumatic without abnormalities Oropharynx:  Without any thrush or ulcers. Eyes: No scleral icterus. Lymph nodes: No lymphadenopathy noted in the cervical, supraclavicular, or axillary nodes Heart:regular rate and rhythm, without any murmurs or gallops.   Lung: Clear to auscultation without any rhonchi, wheezes or dullness to percussion. Abdomin: Soft, nontender without any shifting dullness or ascites. Musculoskeletal: No clubbing or cyanosis. Neurological: No motor or sensory deficits. Skin: No rashes or lesions.           Lab Results: Lab Results  Component Value Date   WBC 6.1 11/10/2019   HGB 13.0 11/10/2019   HCT 38.9 (L) 11/10/2019   MCV 92.6 11/10/2019   PLT 177 11/10/2019     Chemistry      Component Value Date/Time   NA 138 11/10/2019 1003   NA 141 02/01/2017 0749   K 4.3 11/10/2019 1003   K 3.5 02/01/2017 0749   CL 103 11/10/2019 1003   CL 104 06/11/2012 0817   CO2 30 11/10/2019 1003   CO2 26 02/01/2017 0749   BUN 15 11/10/2019 1003   BUN 14.5 02/01/2017 0749   CREATININE 0.98 11/10/2019 1003   CREATININE 0.9 02/01/2017 0749      Component Value Date/Time   CALCIUM 9.4 11/10/2019 1003   CALCIUM 9.2 02/01/2017 0749   ALKPHOS 101 11/10/2019 1003   ALKPHOS 70 02/01/2017 0749   AST 14 (L) 11/10/2019 1003   AST 17 02/01/2017 0749   ALT 8 11/10/2019 1003   ALT 14 02/01/2017 0749   BILITOT 0.6 11/10/2019 1003   BILITOT 0.95 02/01/2017 0749      Results for Hotard, ZYHEIR DAFT (MRN [id number]) as of 11/12/2019 09:49  Ref. Range 08/12/2019 10:30 11/10/2019 10:03  Prostate Specific Ag, Serum Latest Ref Range: 0.0 - 4.0 ng/mL 74.6 (H) 137.0 (H)   IMPRESSION: 1. Substantial increase in widespread osseous metastatic disease in the axial and to a lesser extent appendicular skeleton.  IMPRESSION: 1. Substantial increase in widespread osseous metastatic disease with innumerable sclerotic lesions throughout the skeleton. 2. Enlarging left supraclavicular lymph nodes, not currently pathologically  enlarged but still somewhat suspicious given the clustering. Mild increase in size and solidity of a left upper lobe nodule currently measuring 0.7 by 0.5 cm. Mild increase in size of a tubular nodule in the right upper lobe which could be from airway plugging but is nonspecific. New 0.5 cm sub solid nodule in the right middle lobe. These findings could be primarily postinflammatory but small metastatic lesions in the lungs are not totally excluded and surveillance is likely warranted. 3. Other imaging findings of potential clinical significance: Ascending thoracic aortic aneurysm 4.4 cm in diameter, unchanged from 02/01/2017. Small type 1 hiatal hernia. Bilateral gynecomastia. Prominent stool throughout the colon favors constipation. Stable small hypodense liver lesions are technically too small to characterize although  statistically highly likely to be benign. Stable right kidney upper pole cyst. Stable additional hypodense lesions in both kidneys which are likely cysts but technically too small to characterize. Prostatectomy. 4. Aortic atherosclerosis.  Impression and Plan:  74 year old man with:  1.  Castration-resistant prostate cancer with advanced disease since 2013.  He is status post multiple therapies outlined above.   Laboratory data from 11/10/2019 were reviewed which showed a rise in his PSA that is currently at 137.  Imaging studies show progression of disease in the bones without any clear-cut visceral metastasis.  He does have follow-up small pulmonary nodules that are nonspecific.  These findings discussed today with the patient and treatment options were reviewed.  Given the clear progression of disease I recommended discontinuation of Xtandi.  Alternative treatment options would include systemic chemotherapy in the form of Taxotere, bone directed therapy with Xofigo or off label therapy for his actionable targetable mutation.  Risks and benefits of all these approaches  were discussed at this time.  Complications were reviewed.  After discussion today, he is interested in [place] given the complication associated with chemotherapy and his willingness to continue to perform as a musician.  We will make the appropriate referral to radiation oncology.  We agreed to discontinue Xtandi at this time.    2. Androgen deprivation.  His Next Eligard will be given in October 2021.  Complications associated with this therapy including hot flashes, weight gain were reviewed.  3.  Bone directed therapy: Given his worsening disease to the bone I recommended continuing calcium supplements.  Risks and benefits of Xgeva were discussed at this time.  Complications that include osteonecrosis of the jaw and hypocalcemia were reviewed.  4. Hypertension: Blood pressure mildly elevated anticipate improvement after discontinuation of Xtandi.  5. Follow up: He will return in 3 months for repeat follow-up.  30 minutes were spent on this encounter.  Time was dedicated to reviewing laboratory data, imaging studies and discussing treatment options and complications related to these therapies.  Zola Button, MD 9/16/20219:47 AM

## 2019-11-13 ENCOUNTER — Telehealth: Payer: Self-pay | Admitting: *Deleted

## 2019-11-13 NOTE — Telephone Encounter (Signed)
Patient called to confirm that Dr.Shadad discontinued his Xtandi. Reviewed notes from office visit 11/11/19 - it has been discontinued by MD. Contacted patient with this information - he verbalized understanding.

## 2019-11-23 ENCOUNTER — Telehealth: Payer: Self-pay

## 2019-11-23 NOTE — Telephone Encounter (Signed)
Patient wanted appointment information for next visit. Gave appointment information to patient's wife. She verbalized understanding and will pass the message to the patient.

## 2019-11-26 ENCOUNTER — Encounter: Payer: Self-pay | Admitting: Urology

## 2019-11-26 ENCOUNTER — Other Ambulatory Visit: Payer: Self-pay

## 2019-11-27 ENCOUNTER — Ambulatory Visit
Admission: RE | Admit: 2019-11-27 | Discharge: 2019-11-27 | Disposition: A | Discharge status: Home / Self Care | Payer: Medicare Other | Source: Ambulatory Visit | Attending: Oncology | Admitting: Oncology

## 2019-11-27 ENCOUNTER — Ambulatory Visit
Admission: RE | Admit: 2019-11-27 | Discharge: 2019-11-27 | Disposition: A | Discharge status: Home / Self Care | Payer: Medicare Other | Source: Ambulatory Visit | Attending: Urology | Admitting: Urology

## 2019-11-27 DIAGNOSIS — C772 Secondary and unspecified malignant neoplasm of intra-abdominal lymph nodes: Secondary | ICD-10-CM

## 2019-11-27 DIAGNOSIS — C61 Malignant neoplasm of prostate: Secondary | ICD-10-CM

## 2019-11-27 NOTE — Progress Notes (Addendum)
Radiation Oncology         [phone] ________________________________  Outpatient Re-Consultation - Conducted via Telephone due to current COVID-19 concerns for limiting patient exposure  Name: [name] MRN: [id number]  Date: 11/27/2019  DOB: 07/23/1945  [id number], Joelene Millin, MD  [name], MD   REFERRING PHYSICIAN: [name], MD  DIAGNOSIS: 74 y.o. gentleman with metastatic, castrate resistant prostate cancer, s/p prostatectomy for Stage T3b N0, Gleason 4+3 with oligometastatic disease to the bones and lymph nodes.    ICD-10-CM   1. Prostate cancer metastatic to intraabdominal lymph node ([place])  C61    C77.2   2. Malignant neoplasm of prostate ([place])  C61    HISTORY OF PRESENT ILLNESS: [name] is a 74 y.o. male with a diagnosis of metastatic, castrate resistant prostate cancer. He was initially diagnosed with Gleason 4+3 adenocarcinoma of the prostate with a PSA of 6.45 at the time of diagnosis in 2003.  He elected to proceed with radical prostatectomy with BPLND on 11/05/2001 with Dr. Risa Grill. Final pathology confirmed pT3bN0 with focal extracapsular extension and bilateral seminal vesicle involvement, but negative margins and lymph nodes. He initially had a good response but unfortunately, his PSA started rising, and he was treated with 64.8 Gy of salvage external beam radiation to the prostate fossa with Dr. Valere Dross in 2005.   He again had a good response initially but his PSA began rising once again, up to 35 in 09/2009, and he was started on Lupron ADT.  His PSA nadired at 3.17 in 05/2010 but was back up to 5.91 in 01/2011, despite castrate level testosterone of 35. He was referred to Dr. Alen Blew, in medical oncology, at that time and a staging bone scan in January 2013 was negative for bony metastasis.  Casodex was added to the Lupron ADT in 04/2011 when his PSA had reached 10.53.  The PSA nadired at 1.8 in January 2014 but began rising again in April 2014.  He  had a restaging bone scan in April 2015 which was again negative for osseous metastasis with a PSA of 4.25 at that time.  The Casodex was discontinued in 02/2014 due to disease progression with a PSA up to 12.65 and then further elevated up to 37.26 in February 2016. A restaging CT C/A/P at that time did not show any evidence of visceral metastatic disease. [name] was initiated in 04/2014, with a PSA nadir of 1.29 in December 2016 but back up to 15.7 in December 2018.  Disease restaging scans were performed at that time with bone scan showing the development of limited osseous metastatic disease in the ribs and thoracic spine.  Zytiga was discontinued and his systemic therapy was changed to [facility] with an initial decrease in PSA to 9.0 in January 2019.  His PSA continued to gradually rise despite Xtandi and Lupron every 4 months with PSA at 41.5 on 11/11/2018. Restaging scans were also performed on 11/11/2018.  CT A/P on 11/11/2018 showed significant interval enlargement of hypodense lymph nodes posterior to the inferior vena cava measuring 2.7 cm (up from 1 cm in 01/2017). Bone scan showed no significant interval change of multiple metastatic foci, with no new lesions.We met with the patient on 11/14/2018 and treated the abdominal lymph nodes with 5 fractions of SBRT, completed on 12/11/18.  Following treatment, his PSA dropped slightly to 35.2 in 01/2019. Unfortunately, his PSA has begun to rise again, initially with a slow, gradual rise but more recently was noted to  be up to 137 in 10/2019 as compared to 74.6 in 07/2019 and 39.4 in 03/2019. He underwent restaging scans on 11/10/2019. CT C/A/P showed a substantial increase in widespread osseous metastatic disease with innumerable sclerotic lesions throughout skeleton as well as enlarging left supraclavicular lymph nodes, up to 0.8 cm, but no overtly pathologically enlarged lymph nodes. Bilateral lung nodules could be primarily postinflammatory but small metastatic  lesions could not be entirely excluded. Bone scan also confirmed substantial increase in widespread osseous metastatic disease. His Gillermina Phy has been discontinued at this time. Alternative treatment options were discussed with Dr. Alen Blew at his most recent office visit 11/12/19, including systemic chemotherapy in the form of Taxotere, bone directed therapy with Xofigo or off label therapy for his actionable targetable mutation.   The patient reviewed the recent PSA and imaging results with Dr. 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  PREVIOUS RADIATION THERAPY: Yes  12/01/2018 - 12/11/2018: Abdominal Lymph Nodes / 40 Gy in 5 fractions (SBRT)  03/2003 - 05/2003: Prostate Fossa / 64.8 Gy in 36 fractions Valere Dross)  PAST MEDICAL HISTORY:  Past Medical History:  Diagnosis Date  . Hx of radiation therapy   . Prostate cancer ([place])       PAST SURGICAL HISTORY: Past Surgical History:  Procedure Laterality Date  . BURR HOLE Right 09/14/2015   Procedure: Right craniotomy for subdural ;  Surgeon: Kary Kos, MD;  Location: [place] [facility];  Service: Neurosurgery;  Laterality: Right;  . PROSTATE BIOPSY    . PROSTATECTOMY      FAMILY HISTORY:  Family History  Problem Relation Age of Onset  . Skin cancer Father   . Thyroid cancer Brother   . Throat cancer Brother   . 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 Social History   Socioeconomic History  . Marital status: Married    Spouse name: Not on file  . Number of children: 1  . Years of education: Not on file  . Highest education level: Not on file  Occupational History  . Not on file  Tobacco Use  . Smoking status: Never Smoker  . Smokeless tobacco: Never Used  Vaping Use  . Vaping Use: Never used  Substance and Sexual Activity   . Alcohol use: Yes    Comment: socially  . Drug use: No  . Sexual activity: Not Currently  Other Topics Concern  . Not on file  Social History Narrative   Has one son.   Social Determinants of Health   Financial Resource Strain:   . Difficulty of Paying Living Expenses: Not on file  Food Insecurity:   . Worried About Charity fundraiser in the Last Year: Not on file  . Ran Out of Food in the Last Year: Not on file  Transportation Needs:   . Lack of Transportation (Medical): Not on file  . Lack of Transportation (Non-Medical): Not on file  Physical Activity:   . Days of Exercise per Week: Not on file  . Minutes of Exercise per Session: Not on file  Stress:   . Feeling of Stress : Not on file  Social Connections:   . Frequency of Communication with Friends and Family: Not on file  . Frequency of Social Gatherings with Friends and Family: Not  on file  . Attends Religious Services: Not on file  . Active Member of Clubs or Organizations: Not on file  . Attends Archivist Meetings: Not on file  . Marital Status: Not on file  Intimate Partner Violence:   . Fear of Current or Ex-Partner: Not on file  . Emotionally Abused: Not on file  . Physically Abused: Not on file  . Sexually Abused: Not on file    ALLERGIES: Penicillins  MEDICATIONS:  Current Outpatient Medications  Medication Sig Dispense Refill  . amLODipine (NORVASC) 5 MG tablet amlodipine 5 mg tablet  Take 1 tablet every day by oral route.    . Ascorbic Acid (VITAMIN C) 100 MG CHEW Vitamin C  1 once a day    . aspirin EC 81 MG tablet Take 81 mg by mouth daily.    . calcium-vitamin D (OSCAL 500/200 D-3) 500-200 MG-UNIT tablet Take 1 tablet by mouth daily with breakfast. 1 tablet   . Cholecalciferol (VITAMIN D3) 3000 units TABS cholecalciferol (vitamin D3) 1,000 unit tablet  1 tablet orally once a day    . Leuprolide Acetate (LUPRON DEPOT IM) Inject into the muscle every 4 (four) months.    . Multiple  Vitamins-Minerals (MULTIVITAMIN ADULT PO) multivitamin  1 tablet orally once a day    . Omega-3 Fatty Acids (FISH OIL) 1000 MG CAPS Fish Oil  2 tablet po daily    . ALPRAZolam (XANAX) 0.5 MG tablet Take 0.5 mg by mouth 3 (three) times daily as needed for sleep. (Patient not taking: Reported on 11/26/2019)    . Bergamot Oil OIL Bergamot  Take 1 or 2 capsules daily (Patient not taking: Reported on 11/26/2019)    . NON FORMULARY CBD oil 500 mg  Take 1 dropperful under tongue in the evening or at bedtime (Patient not taking: Reported on 11/26/2019)    . XTANDI 40 MG tablet TAKE 4 TABLETS (160 MG TOTAL) BY MOUTH DAILY. (Patient not taking: Reported on 11/26/2019) 120 tablet 0   No current facility-administered medications for this encounter.    REVIEW OF SYSTEMS:  On review of systems, the patient reports that he is doing well overall. He denies any chest pain, shortness of breath, cough, fevers, chills, night sweats, or unintended weight changes. He denies any bowel disturbances, and denies abdominal pain, nausea or vomiting. He denies any new musculoskeletal or joint aches or pains. He reports chronic, tolerable, intermittent left upper back and left chest pain, which does not require pain medicine and remains unchanged recently. He is currently without complaints related to his cancer. He specifically denies bowel or bladder issues , paraesthesias or focal weakness in his extremities.  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  A complete review of systems is obtained and is otherwise negative.    PHYSICAL EXAM:  Wt Readings from Last 3 Encounters:  11/12/19 236 lb 6.4 oz (107.2 kg)  08/14/19 240 lb 8 oz (109.1 kg)  07/03/19 240 lb (108.9 kg)   Temp Readings from Last 3 Encounters:  11/12/19 98.4 F (36.9 C) (Oral)  08/14/19 (!) 97.5 F (36.4 C) (Temporal)  07/03/19 97.8 F (36.6 C) (Temporal)   BP Readings from Last 3 Encounters:  11/12/19 (!) 151/84   08/14/19 129/75  07/03/19 126/74   Pulse Readings from Last 3 Encounters:  11/12/19 67  08/14/19 72  07/03/19 78    /10  Unable to assess due to telephone consult visit format.  KPS = 90  100 - Normal; no complaints; no evidence of disease. 90   - Able to carry on normal activity; minor signs or symptoms of disease. 80   - Normal activity with effort; some signs or symptoms of disease. 6   - Cares for self; unable to carry on normal activity or to do active work. 60   - Requires occasional assistance, but is able to care for most of his personal needs. 50   - Requires considerable assistance and frequent medical care. 82   - Disabled; requires special care and assistance. 56   - Severely disabled; hospital admission is indicated although death not imminent. 6   - Very sick; hospital admission necessary; active supportive treatment necessary. 10   - Moribund; fatal processes progressing rapidly. 0     - Dead  Karnofsky DA, Abelmann [place], Craver LS and Burchenal JH [phone]) The use of the nitrogen mustards in the palliative treatment of carcinoma: with particular reference to bronchogenic carcinoma Cancer 1 [id number]  LABORATORY DATA:  Lab Results  Component Value Date   WBC 6.1 11/10/2019   HGB 13.0 11/10/2019   HCT 38.9 (L) 11/10/2019   MCV 92.6 11/10/2019   PLT 177 11/10/2019   Lab Results  Component Value Date   NA 138 11/10/2019   K 4.3 11/10/2019   CL 103 11/10/2019   CO2 30 11/10/2019   Lab Results  Component Value Date   ALT 8 11/10/2019   AST 14 (L) 11/10/2019   ALKPHOS 101 11/10/2019   BILITOT 0.6 11/10/2019     RADIOGRAPHY: CT Chest W Contrast  Result Date: 11/10/2019 CLINICAL DATA:  Prostate cancer, prior prostatectomy. Currently on Xtandi antiandrogen therapy. EXAM: CT CHEST, ABDOMEN, AND PELVIS WITH CONTRAST TECHNIQUE: Multidetector CT imaging of the chest, abdomen and pelvis was performed following the standard protocol during bolus administration of  intravenous contrast. CONTRAST:  168m OMNIPAQUE IOHEXOL 300 MG/ML  SOLN COMPARISON:  Multiple exams, including CT abdomen 11/11/2018 and CT chest from 02/01/2017 FINDINGS: CT CHEST FINDINGS Cardiovascular: Ascending thoracic aortic aneurysm 4.4 cm in diameter on image 30 series 2, unchanged from 02/01/2017. Mild calcification along the posterior leaflet of the mitral valve. Mediastinum/Nodes: Small type 1 hiatal hernia. Clustered left supraclavicular nodes individually measuring up to 0.8 cm in diameter on image 7 of series 2. These have increased in size compared to 02/01/2017. No overtly pathologically enlarged lymph nodes. Small type 1 hiatal hernia. Lungs/Pleura: The pleural based nodular calcification at the right lung apex laterally similar to prior. 1.4 by 0.7 by 0.8 cm superior segment right lower lobe nodule as shown on image 57 series 4, with some mild calcification along its inferior margin, and some adjacent minimal nodularity the peribronchovascular region. This previously measured 1.3 by 0.4 by 0.7 cm and could represent a plugged airway but is not entirely specific. Volume loss medially in the right upper lobe. There is some mild airway plugging distally medially in the right upper lobe for example on image 73 series 4. Sub solid 0.5 cm nodule in the right middle lobe along the major fissure on image 91 series 4, not seen on the prior exam. 0.7 by 0.5 cm left upper lobe nodule anteriorly on image 66/4 with indistinct/sub solid margins, previously there is only a vague sub solid nodularity about 0.3 cm in diameter in this location on 02/01/2017. Musculoskeletal: Bilateral gynecomastia. Innumerable scattered sclerotic lesions in the thoracic skeleton, substantially progressive from 02/01/2017, with no on affected bones in the  thorax identified. For example, for example, a newly appreciable lesion in the T2 vertebral body measures 1.9 by 1.7 cm on image 92 series 6. Progressive compression of the T3  vertebral body compared to the prior chest CT. CT ABDOMEN PELVIS FINDINGS Hepatobiliary: Stable small hypodense liver lesions are technically too small to characterize although statistically highly likely to be benign. Gallbladder unremarkable. Pancreas: Unremarkable Spleen: Unremarkable Adrenals/Urinary Tract: Stable right kidney upper pole cyst. Stable additional hypodense lesions in both kidneys which are likely cysts but technically too small to characterize. Adrenal glands unremarkable. Stomach/Bowel: Prominent stool throughout the colon favors constipation. Normal appendix. Vascular/Lymphatic: Aortoiliac atherosclerotic vascular disease. No pathologic adenopathy identified. Reproductive: Prostatectomy. Other: No supplemental non-categorized findings. Musculoskeletal: Compared to 11/11/2018, there is substantial increase in number of the sclerotic osseous metastatic lesions throughout the visualized lumbar spine and bony pelvis. Most of these lesions are subcentimeter in size with a few larger, such as the 1.5 cm in diameter lesion in the left iliac bone along the acetabular roof on image 110 series 2. IMPRESSION: 1. Substantial increase in widespread osseous metastatic disease with innumerable sclerotic lesions throughout the skeleton. 2. Enlarging left supraclavicular lymph nodes, not currently pathologically enlarged but still somewhat suspicious given the clustering. Mild increase in size and solidity of a left upper lobe nodule currently measuring 0.7 by 0.5 cm. Mild increase in size of a tubular nodule in the right upper lobe which could be from airway plugging but is nonspecific. New 0.5 cm sub solid nodule in the right middle lobe. These findings could be primarily postinflammatory but small metastatic lesions in the lungs are not totally excluded and surveillance is likely warranted. 3. Other imaging findings of potential clinical significance: Ascending thoracic aortic aneurysm 4.4 cm in diameter,  unchanged from 02/01/2017. Small type 1 hiatal hernia. Bilateral gynecomastia. Prominent stool throughout the colon favors constipation. Stable small hypodense liver lesions are technically too small to characterize although statistically highly likely to be benign. Stable right kidney upper pole cyst. Stable additional hypodense lesions in both kidneys which are likely cysts but technically too small to characterize. Prostatectomy. 4. Aortic atherosclerosis. Aortic Atherosclerosis (ICD10-I70.0). Electronically Signed   By: Van Clines M.D.   On: 11/10/2019 15:51   NM Bone Scan Whole Body  Result Date: 11/10/2019 CLINICAL DATA:  Prostate cancer with osseous metastatic disease EXAM: NUCLEAR MEDICINE WHOLE BODY BONE SCAN TECHNIQUE: Whole body anterior and posterior images were obtained approximately 3 hours after intravenous injection of radiopharmaceutical. RADIOPHARMACEUTICALS:  20.7 mCi Technetium-58mMDP IV COMPARISON:  11/11/2018 FINDINGS: Substantial increase in burden of osseous metastatic disease in the axial and appendicular skeleton, with numerous scattered lesions in the ribs, spine, bony pelvis, skull, mandible, proximal femurs, and bilateral scapula along with a questionable lesion in the right proximal tibia. Accentuated activity in the left foot in the vicinity of the MTP joint is probably degenerative. Expected renal and urinary bladder activity IMPRESSION: 1. Substantial increase in widespread osseous metastatic disease in the axial and to a lesser extent appendicular skeleton. Electronically Signed   By: WVan ClinesM.D.   On: 11/10/2019 15:54   CT Abdomen Pelvis W Contrast  Result Date: 11/10/2019 CLINICAL DATA:  Prostate cancer, prior prostatectomy. Currently on Xtandi antiandrogen therapy. EXAM: CT CHEST, ABDOMEN, AND PELVIS WITH CONTRAST TECHNIQUE: Multidetector CT imaging of the chest, abdomen and pelvis was performed following the standard protocol during bolus  administration of intravenous contrast. CONTRAST:  1040mOMNIPAQUE IOHEXOL 300 MG/ML  SOLN COMPARISON:  Multiple exams,  including CT abdomen 11/11/2018 and CT chest from 02/01/2017 FINDINGS: CT CHEST FINDINGS Cardiovascular: Ascending thoracic aortic aneurysm 4.4 cm in diameter on image 30 series 2, unchanged from 02/01/2017. Mild calcification along the posterior leaflet of the mitral valve. Mediastinum/Nodes: Small type 1 hiatal hernia. Clustered left supraclavicular nodes individually measuring up to 0.8 cm in diameter on image 7 of series 2. These have increased in size compared to 02/01/2017. No overtly pathologically enlarged lymph nodes. Small type 1 hiatal hernia. Lungs/Pleura: The pleural based nodular calcification at the right lung apex laterally similar to prior. 1.4 by 0.7 by 0.8 cm superior segment right lower lobe nodule as shown on image 57 series 4, with some mild calcification along its inferior margin, and some adjacent minimal nodularity the peribronchovascular region. This previously measured 1.3 by 0.4 by 0.7 cm and could represent a plugged airway but is not entirely specific. Volume loss medially in the right upper lobe. There is some mild airway plugging distally medially in the right upper lobe for example on image 73 series 4. Sub solid 0.5 cm nodule in the right middle lobe along the major fissure on image 91 series 4, not seen on the prior exam. 0.7 by 0.5 cm left upper lobe nodule anteriorly on image 66/4 with indistinct/sub solid margins, previously there is only a vague sub solid nodularity about 0.3 cm in diameter in this location on 02/01/2017. Musculoskeletal: Bilateral gynecomastia. Innumerable scattered sclerotic lesions in the thoracic skeleton, substantially progressive from 02/01/2017, with no on affected bones in the thorax identified. For example, for example, a newly appreciable lesion in the T2 vertebral body measures 1.9 by 1.7 cm on image 92 series 6. Progressive  compression of the T3 vertebral body compared to the prior chest CT. CT ABDOMEN PELVIS FINDINGS Hepatobiliary: Stable small hypodense liver lesions are technically too small to characterize although statistically highly likely to be benign. Gallbladder unremarkable. Pancreas: Unremarkable Spleen: Unremarkable Adrenals/Urinary Tract: Stable right kidney upper pole cyst. Stable additional hypodense lesions in both kidneys which are likely cysts but technically too small to characterize. Adrenal glands unremarkable. Stomach/Bowel: Prominent stool throughout the colon favors constipation. Normal appendix. Vascular/Lymphatic: Aortoiliac atherosclerotic vascular disease. No pathologic adenopathy identified. Reproductive: Prostatectomy. Other: No supplemental non-categorized findings. Musculoskeletal: Compared to 11/11/2018, there is substantial increase in number of the sclerotic osseous metastatic lesions throughout the visualized lumbar spine and bony pelvis. Most of these lesions are subcentimeter in size with a few larger, such as the 1.5 cm in diameter lesion in the left iliac bone along the acetabular roof on image 110 series 2. IMPRESSION: 1. Substantial increase in widespread osseous metastatic disease with innumerable sclerotic lesions throughout the skeleton. 2. Enlarging left supraclavicular lymph nodes, not currently pathologically enlarged but still somewhat suspicious given the clustering. Mild increase in size and solidity of a left upper lobe nodule currently measuring 0.7 by 0.5 cm. Mild increase in size of a tubular nodule in the right upper lobe which could be from airway plugging but is nonspecific. New 0.5 cm sub solid nodule in the right middle lobe. These findings could be primarily postinflammatory but small metastatic lesions in the lungs are not totally excluded and surveillance is likely warranted. 3. Other imaging findings of potential clinical significance: Ascending thoracic aortic aneurysm  4.4 cm in diameter, unchanged from 02/01/2017. Small type 1 hiatal hernia. Bilateral gynecomastia. Prominent stool throughout the colon favors constipation. Stable small hypodense liver lesions are technically too small to characterize although statistically highly likely to be  benign. Stable right kidney upper pole cyst. Stable additional hypodense lesions in both kidneys which are likely cysts but technically too small to characterize. Prostatectomy. 4. Aortic atherosclerosis. Aortic Atherosclerosis (ICD10-I70.0). Electronically Signed   By: Van Clines M.D.   On: 11/10/2019 15:51      IMPRESSION/PLAN:  This visit was conducted via Telephone to spare the patient unnecessary potential exposure in the healthcare setting during the current COVID-19 pandemic.  1. 74 y.o. gentleman with metastatic, castrate resistant prostate cancer, s/p prostatectomy for Stage T3b N0, Gleason 4+3 with progressive, diffuse bony metastases. Today, we talked to the patient about the findings and workup thus far. We discussed the natural history of metastatic prostate cancer and general treatment, highlighting the role of Xogifo infusions in the management. We compared and contrasted these with systemic therapy options to the best of our ability. We encouraged him to reach out to Dr. Hazeline Junker office with any additional questions regarding systemic therapy options and side effects. The recommendation is to proceed with monthly infusions of Xofigo x6.  We focused on the details and logistics of delivery and reviewed the anticipated acute and late sequelae associated with Xofigo in this setting.  We will monitor labs prior to each infusion to ensure it is safe to proceed with each treatment. The patient was encouraged to ask questions that were answered to his satisfaction.  At the end of our conversation, the patient elects to proceed with Xofigo infusions. We will share this information with Dr. Alen Blew and proceed with  treatment planning accordingly, in anticipation of beginning treatment in the near future. He states that he has a performance coming up on 12/18/2019 and would prefer to begin treatments after that time. We are happy to accommodate that. He also had questions about the cost of treatment, so we will have our financial team reach out to him as well.  Given current concerns for patient exposure during the COVID-19 pandemic, this encounter was conducted via telephone. The patient was notified in advance and was offered a MyChart meeting to allow for face to face communication but unfortunately reported that he did not have the appropriate resources/technology to support such a visit and instead preferred to proceed with telephone consult. The patient has given verbal consent for this type of encounter. The time spent during this encounter was 60 minutes. The attendants for this meeting include Tyler Pita MD, Bonnee Zertuche PA-C, Shannon, patient, Bonnie Roig and his wife. During the encounter, Tyler Pita MD, Mattelyn Imhoff PA-C, and scribe, Wilburn Mylar were located at [place].  Patient, Jeffery Bachmeier and his wife were located at home.    George Johns, PA-C    Tyler Pita, MD  [facility] Direct Dial: [phone]  Fax: [phone] [email or web address]  Skype  [email or web address]  This document serves as a record of services personally performed by Tyler Pita, MD and Freeman Caldron, PA-C. It was created on their behalf by Wilburn Mylar, a trained medical scribe. The creation of this record is based on the scribe's personal observations and the provider's statements to them. This document has been checked and approved by the attending provider.

## 2019-12-02 ENCOUNTER — Other Ambulatory Visit (HOSPITAL_COMMUNITY): Payer: Self-pay | Admitting: Radiation Oncology

## 2019-12-02 DIAGNOSIS — C61 Malignant neoplasm of prostate: Secondary | ICD-10-CM

## 2019-12-04 ENCOUNTER — Telehealth: Payer: Self-pay | Admitting: *Deleted

## 2019-12-04 NOTE — Telephone Encounter (Signed)
CALLED PATIENT TO INFORM OF LAB AND WEIGHT APPT. ON 12-11-19 @ 12 PM @ [place]. FOR 12-18-19 - ARRIVAL TIME- 10:45 AM @ [facility], SPOKE WITH PATIENT AND HE IS AWARE OF THESE APPTS.

## 2019-12-08 ENCOUNTER — Telehealth: Payer: Self-pay | Admitting: Oncology

## 2019-12-08 NOTE — Telephone Encounter (Signed)
Scheduled per los, patient has been called and notified. 

## 2019-12-10 ENCOUNTER — Other Ambulatory Visit: Payer: Self-pay | Admitting: Radiation Oncology

## 2019-12-10 DIAGNOSIS — C7951 Secondary malignant neoplasm of bone: Secondary | ICD-10-CM

## 2019-12-10 DIAGNOSIS — C7952 Secondary malignant neoplasm of bone marrow: Secondary | ICD-10-CM

## 2019-12-11 ENCOUNTER — Ambulatory Visit
Admission: RE | Admit: 2019-12-11 | Discharge: 2019-12-11 | Disposition: A | Discharge status: Home / Self Care | Payer: Medicare Other | Source: Ambulatory Visit | Attending: Radiation Oncology | Admitting: Radiation Oncology

## 2019-12-11 ENCOUNTER — Other Ambulatory Visit: Payer: Self-pay

## 2019-12-11 DIAGNOSIS — C7951 Secondary malignant neoplasm of bone: Secondary | ICD-10-CM

## 2019-12-11 DIAGNOSIS — C61 Malignant neoplasm of prostate: Secondary | ICD-10-CM | POA: Insufficient documentation

## 2019-12-11 LAB — CBC WITH DIFFERENTIAL (CANCER CENTER ONLY)
Abs Immature Granulocytes: 0.05 10*3/uL (ref 0.00–0.07)
Basophils Absolute: 0 10*3/uL (ref 0.0–0.1)
Basophils Relative: 0 %
Eosinophils Absolute: 0.1 10*3/uL (ref 0.0–0.5)
Eosinophils Relative: 2 %
HCT: 41.1 % (ref 39.0–52.0)
Hemoglobin: 13.8 g/dL (ref 13.0–17.0)
Immature Granulocytes: 1 %
Lymphocytes Relative: 24 %
Lymphs Abs: 1.7 10*3/uL (ref 0.7–4.0)
MCH: 31.6 pg (ref 26.0–34.0)
MCHC: 33.6 g/dL (ref 30.0–36.0)
MCV: 94.1 fL (ref 80.0–100.0)
Monocytes Absolute: 0.5 10*3/uL (ref 0.1–1.0)
Monocytes Relative: 7 %
Neutro Abs: 4.8 10*3/uL (ref 1.7–7.7)
Neutrophils Relative %: 66 %
Platelet Count: 190 10*3/uL (ref 150–400)
RBC: 4.37 MIL/uL (ref 4.22–5.81)
RDW: 12.6 % (ref 11.5–15.5)
WBC Count: 7.2 10*3/uL (ref 4.0–10.5)
nRBC: 0 % (ref 0.0–0.2)

## 2019-12-12 LAB — PROSTATE-SPECIFIC AG, SERUM (LABCORP): Prostate Specific Ag, Serum: 208 ng/mL — ABNORMAL HIGH (ref 0.0–4.0)

## 2019-12-14 ENCOUNTER — Encounter: Payer: Self-pay | Admitting: Radiation Oncology

## 2019-12-14 NOTE — Progress Notes (Signed)
Labs collected on 12/11/2019 in preparation for xofigo on 12/18/2019 are W[facility]. Copy of labs placed in Dr. Johny Shears inbox to review prior to injection.

## 2019-12-15 ENCOUNTER — Other Ambulatory Visit: Payer: Self-pay | Admitting: Oncology

## 2019-12-15 DIAGNOSIS — C61 Malignant neoplasm of prostate: Secondary | ICD-10-CM

## 2019-12-17 ENCOUNTER — Inpatient Hospital Stay: Payer: Medicare Other | Attending: Oncology

## 2019-12-17 ENCOUNTER — Other Ambulatory Visit: Payer: Self-pay

## 2019-12-17 ENCOUNTER — Telehealth: Payer: Self-pay | Admitting: *Deleted

## 2019-12-17 ENCOUNTER — Inpatient Hospital Stay: Payer: Medicare Other

## 2019-12-17 ENCOUNTER — Other Ambulatory Visit: Payer: Self-pay | Admitting: Oncology

## 2019-12-17 VITALS — BP 137/73 | HR 73 | Resp 18

## 2019-12-17 DIAGNOSIS — Z79818 Long term (current) use of other agents affecting estrogen receptors and estrogen levels: Secondary | ICD-10-CM | POA: Diagnosis not present

## 2019-12-17 DIAGNOSIS — Z79899 Other long term (current) drug therapy: Secondary | ICD-10-CM | POA: Insufficient documentation

## 2019-12-17 DIAGNOSIS — C61 Malignant neoplasm of prostate: Secondary | ICD-10-CM | POA: Diagnosis present

## 2019-12-17 LAB — CBC WITH DIFFERENTIAL (CANCER CENTER ONLY)
Abs Immature Granulocytes: 0.02 10*3/uL (ref 0.00–0.07)
Basophils Absolute: 0 10*3/uL (ref 0.0–0.1)
Basophils Relative: 0 %
Eosinophils Absolute: 0.2 10*3/uL (ref 0.0–0.5)
Eosinophils Relative: 3 %
HCT: 38 % — ABNORMAL LOW (ref 39.0–52.0)
Hemoglobin: 12.7 g/dL — ABNORMAL LOW (ref 13.0–17.0)
Immature Granulocytes: 0 %
Lymphocytes Relative: 28 %
Lymphs Abs: 1.9 10*3/uL (ref 0.7–4.0)
MCH: 31.4 pg (ref 26.0–34.0)
MCHC: 33.4 g/dL (ref 30.0–36.0)
MCV: 94.1 fL (ref 80.0–100.0)
Monocytes Absolute: 0.5 10*3/uL (ref 0.1–1.0)
Monocytes Relative: 8 %
Neutro Abs: 4.2 10*3/uL (ref 1.7–7.7)
Neutrophils Relative %: 61 %
Platelet Count: 183 10*3/uL (ref 150–400)
RBC: 4.04 MIL/uL — ABNORMAL LOW (ref 4.22–5.81)
RDW: 12.6 % (ref 11.5–15.5)
WBC Count: 6.8 10*3/uL (ref 4.0–10.5)
nRBC: 0 % (ref 0.0–0.2)

## 2019-12-17 LAB — CMP (CANCER CENTER ONLY)
ALT: 9 U/L (ref 0–44)
AST: 13 U/L — ABNORMAL LOW (ref 15–41)
Albumin: 3.3 g/dL — ABNORMAL LOW (ref 3.5–5.0)
Alkaline Phosphatase: 128 U/L — ABNORMAL HIGH (ref 38–126)
Anion gap: 8 (ref 5–15)
BUN: 20 mg/dL (ref 8–23)
CO2: 24 mmol/L (ref 22–32)
Calcium: 9.3 mg/dL (ref 8.9–10.3)
Chloride: 106 mmol/L (ref 98–111)
Creatinine: 1.02 mg/dL (ref 0.61–1.24)
GFR, Estimated: >60 mL/min (ref >60)
Glucose, Bld: 93 mg/dL (ref 70–99)
Potassium: 4.5 mmol/L (ref 3.5–5.1)
Sodium: 138 mmol/L (ref 135–145)
Total Bilirubin: 0.4 mg/dL (ref 0.3–1.2)
Total Protein: 6.9 g/dL (ref 6.5–8.1)

## 2019-12-17 MED ORDER — LEUPROLIDE ACETATE (3 MONTH) 22.5 MG ~~LOC~~ KIT
PACK | SUBCUTANEOUS | Status: AC
  Filled 2019-12-17: qty 22.5

## 2019-12-17 MED ORDER — DENOSUMAB 120 MG/1.7ML ~~LOC~~ SOLN
SUBCUTANEOUS | Status: AC
  Filled 2019-12-17: qty 1.7

## 2019-12-17 MED ORDER — LEUPROLIDE ACETATE (4 MONTH) 30 MG ~~LOC~~ KIT
PACK | SUBCUTANEOUS | Status: AC
  Filled 2019-12-17: qty 30

## 2019-12-17 MED ORDER — DENOSUMAB 120 MG/1.7ML ~~LOC~~ SOLN
120.0000 mg | Freq: Once | SUBCUTANEOUS | Status: AC
  Administered 2019-12-17: 120 mg via SUBCUTANEOUS

## 2019-12-17 MED ORDER — LEUPROLIDE ACETATE (4 MONTH) 30 MG ~~LOC~~ KIT
30.0000 mg | PACK | Freq: Once | SUBCUTANEOUS | Status: AC
  Administered 2019-12-17: 30 mg via SUBCUTANEOUS

## 2019-12-17 NOTE — Patient Instructions (Signed)
Leuprolide depot injection What is this medicine? LEUPROLIDE (loo PROE lide) is a man-made protein that acts like a natural hormone in the body. It decreases testosterone in men and decreases estrogen in women. In men, this medicine is used to treat advanced prostate cancer. In women, some forms of this medicine may be used to treat endometriosis, uterine fibroids, or other male hormone-related problems. This medicine may be used for other purposes; ask your health care provider or pharmacist if you have questions. COMMON BRAND NAME(S): Eligard, Fensolv, Lupron Depot, Lupron Depot-Ped, Viadur What should I tell my health care provider before I take this medicine? They need to know if you have any of these conditions:  diabetes  heart disease or previous heart attack  high blood pressure  high cholesterol  mental illness  osteoporosis  pain or difficulty passing urine  seizures  spinal cord metastasis  stroke  suicidal thoughts, plans, or attempt; a previous suicide attempt by you or a family member  tobacco smoker  unusual vaginal bleeding (women)  an unusual or allergic reaction to leuprolide, benzyl alcohol, other medicines, foods, dyes, or preservatives  pregnant or trying to get pregnant  breast-feeding How should I use this medicine? This medicine is for injection into a muscle or for injection under the skin. It is given by a health care professional in a hospital or clinic setting. The specific product will determine how it will be given to you. Make sure you understand which product you receive and how often you will receive it. Talk to your pediatrician regarding the use of this medicine in children. Special care may be needed. Overdosage: If you think you have taken too much of this medicine contact a poison control center or emergency room at once. NOTE: This medicine is only for you. Do not share this medicine with others. What if I miss a dose? It is  important not to miss a dose. Call your doctor or health care professional if you are unable to keep an appointment. Depot injections: Depot injections are given either once-monthly, every 12 weeks, every 16 weeks, or every 24 weeks depending on the product you are prescribed. The product you are prescribed will be based on if you are male or male, and your condition. Make sure you understand your product and dosing. What may interact with this medicine? Do not take this medicine with any of the following medications:  chasteberry This medicine may also interact with the following medications:  herbal or dietary supplements, like black cohosh or DHEA  male hormones, like estrogens or progestins and birth control pills, patches, rings, or injections  male hormones, like testosterone This list may not describe all possible interactions. Give your health care provider a list of all the medicines, herbs, non-prescription drugs, or dietary supplements you use. Also tell them if you smoke, drink alcohol, or use illegal drugs. Some items may interact with your medicine. What should I watch for while using this medicine? Visit your doctor or health care professional for regular checks on your progress. During the first weeks of treatment, your symptoms may get worse, but then will improve as you continue your treatment. You may get hot flashes, increased bone pain, increased difficulty passing urine, or an aggravation of nerve symptoms. Discuss these effects with your doctor or health care professional, some of them may improve with continued use of this medicine. Male patients may experience a menstrual cycle or spotting during the first months of therapy with this medicine.  If this continues, contact your doctor or health care professional. This medicine may increase blood sugar. Ask your healthcare provider if changes in diet or medicines are needed if you have diabetes. What side effects may I  notice from receiving this medicine? Side effects that you should report to your doctor or health care professional as soon as possible:  allergic reactions like skin rash, itching or hives, swelling of the face, lips, or tongue  breathing problems  chest pain  depression or memory disorders  pain in your legs or groin  pain at site where injected or implanted  seizures  severe headache  signs and symptoms of high blood sugar such as being more thirsty or hungry or having to urinate more than normal. You may also feel very tired or have blurry vision  swelling of the feet and legs  suicidal thoughts or other mood changes  visual changes  vomiting Side effects that usually do not require medical attention (report to your doctor or health care professional if they continue or are bothersome):  breast swelling or tenderness  decrease in sex drive or performance  diarrhea  hot flashes  loss of appetite  muscle, joint, or bone pains  nausea  redness or irritation at site where injected or implanted  skin problems or acne This list may not describe all possible side effects. Call your doctor for medical advice about side effects. You may report side effects to FDA at 1-800-FDA-1088. Where should I keep my medicine? This drug is given in a hospital or clinic and will not be stored at home. NOTE: This sheet is a summary. It may not cover all possible information. If you have questions about this medicine, talk to your doctor, pharmacist, or health care provider.  2020 Elsevier/Gold Standard (2017-12-12 09:27:03) Denosumab injection What is this medicine? DENOSUMAB (den oh sue mab) slows bone breakdown. Prolia is used to treat osteoporosis in women after menopause and in men, and in people who are taking corticosteroids for 6 months or more. [name] is used to treat a high calcium level due to cancer and to prevent bone fractures and other bone problems caused by multiple  myeloma or cancer bone metastases. [name] is also used to treat giant cell tumor of the bone. This medicine may be used for other purposes; ask your health care provider or pharmacist if you have questions. COMMON BRAND NAME(S): Prolia, XGEVA What should I tell my health care provider before I take this medicine? They need to know if you have any of these conditions:  dental disease  having surgery or tooth extraction  infection  kidney disease  low levels of calcium or Vitamin D in the blood  malnutrition  on hemodialysis  skin conditions or sensitivity  thyroid or parathyroid disease  an unusual reaction to denosumab, other medicines, foods, dyes, or preservatives  pregnant or trying to get pregnant  breast-feeding How should I use this medicine? This medicine is for injection under the skin. It is given by a health care professional in a hospital or clinic setting. A special MedGuide will be given to you before each treatment. Be sure to read this information carefully each time. For Prolia, talk to your pediatrician regarding the use of this medicine in children. Special care may be needed. For [name], talk to your pediatrician regarding the use of this medicine in children. While this drug may be prescribed for children as young as 13 years for selected conditions, precautions do apply. Overdosage:  If you think you have taken too much of this medicine contact a poison control center or emergency room at once. NOTE: This medicine is only for you. Do not share this medicine with others. What if I miss a dose? It is important not to miss your dose. Call your doctor or health care professional if you are unable to keep an appointment. What may interact with this medicine? Do not take this medicine with any of the following medications:  other medicines containing denosumab This medicine may also interact with the following medications:  medicines that lower your chance of  fighting infection  steroid medicines like prednisone or cortisone This list may not describe all possible interactions. Give your health care provider a list of all the medicines, herbs, non-prescription drugs, or dietary supplements you use. Also tell them if you smoke, drink alcohol, or use illegal drugs. Some items may interact with your medicine. What should I watch for while using this medicine? Visit your doctor or health care professional for regular checks on your progress. Your doctor or health care professional may order blood tests and other tests to see how you are doing. Call your doctor or health care professional for advice if you get a fever, chills or sore throat, or other symptoms of a cold or flu. Do not treat yourself. This drug may decrease your body's ability to fight infection. Try to avoid being around people who are sick. You should make sure you get enough calcium and vitamin D while you are taking this medicine, unless your doctor tells you not to. Discuss the foods you eat and the vitamins you take with your health care professional. See your dentist regularly. Brush and floss your teeth as directed. Before you have any dental work done, tell your dentist you are receiving this medicine. Do not become pregnant while taking this medicine or for 5 months after stopping it. Talk with your doctor or health care professional about your birth control options while taking this medicine. Women should inform their doctor if they wish to become pregnant or think they might be pregnant. There is a potential for serious side effects to an unborn child. Talk to your health care professional or pharmacist for more information. What side effects may I notice from receiving this medicine? Side effects that you should report to your doctor or health care professional as soon as possible:  allergic reactions like skin rash, itching or hives, swelling of the face, lips, or tongue  bone  pain  breathing problems  dizziness  jaw pain, especially after dental work  redness, blistering, peeling of the skin  signs and symptoms of infection like fever or chills; cough; sore throat; pain or trouble passing urine  signs of low calcium like fast heartbeat, muscle cramps or muscle pain; pain, tingling, numbness in the hands or feet; seizures  unusual bleeding or bruising  unusually weak or tired Side effects that usually do not require medical attention (report to your doctor or health care professional if they continue or are bothersome):  constipation  diarrhea  headache  joint pain  loss of appetite  muscle pain  runny nose  tiredness  upset stomach This list may not describe all possible side effects. Call your doctor for medical advice about side effects. You may report side effects to FDA at 1-800-FDA-1088. Where should I keep my medicine? This medicine is only given in a clinic, doctor's office, or other health care setting and will not be  stored at home. NOTE: This sheet is a summary. It may not cover all possible information. If you have questions about this medicine, talk to your doctor, pharmacist, or health care provider.  2020 Elsevier/Gold Standard (2017-06-21 16:10:44)

## 2019-12-17 NOTE — Telephone Encounter (Signed)
Called patient to remind of Xofigo Inj. for 12-18-19 - arrival time- 10:45 am, spoke with patient and he is aware of this appt.

## 2019-12-18 ENCOUNTER — Encounter (HOSPITAL_COMMUNITY)
Admission: RE | Admit: 2019-12-18 | Discharge: 2019-12-18 | Disposition: A | Discharge status: Home / Self Care | Payer: Medicare Other | Source: Ambulatory Visit | Attending: Radiation Oncology | Admitting: Radiation Oncology

## 2019-12-18 DIAGNOSIS — C61 Malignant neoplasm of prostate: Secondary | ICD-10-CM | POA: Insufficient documentation

## 2019-12-18 DIAGNOSIS — C7951 Secondary malignant neoplasm of bone: Secondary | ICD-10-CM | POA: Insufficient documentation

## 2019-12-18 LAB — PROSTATE-SPECIFIC AG, SERUM (LABCORP): Prostate Specific Ag, Serum: 202 ng/mL — ABNORMAL HIGH (ref 0.0–4.0)

## 2019-12-18 MED ORDER — RADIUM RA 223 DICHLORIDE 30 MCCI/ML IV SOLN
160.7000 | Freq: Once | INTRAVENOUS | Status: AC | PRN
  Administered 2019-12-18: 160.7 via INTRAVENOUS

## 2019-12-24 ENCOUNTER — Telehealth: Payer: Self-pay | Admitting: *Deleted

## 2019-12-24 ENCOUNTER — Other Ambulatory Visit (HOSPITAL_COMMUNITY): Payer: Self-pay | Admitting: Pediatrics

## 2019-12-24 ENCOUNTER — Other Ambulatory Visit (HOSPITAL_COMMUNITY): Payer: Self-pay | Admitting: Radiation Oncology

## 2019-12-24 DIAGNOSIS — C7951 Secondary malignant neoplasm of bone: Secondary | ICD-10-CM

## 2019-12-24 DIAGNOSIS — C61 Malignant neoplasm of prostate: Secondary | ICD-10-CM

## 2019-12-24 NOTE — Telephone Encounter (Signed)
CALLED PATIENT TO INFORM OF LAB AND WEIGHT APPT. FOR 01-12-20 @ 12 PM @ [place]. ON 01-19-20 - ARRIVAL TIME- 11:15 AM @ [facility], SPOKE WITH PATIENT AND HE IS AWARE OF THESE APPTS.

## 2020-01-11 ENCOUNTER — Other Ambulatory Visit: Payer: Self-pay | Admitting: Radiation Oncology

## 2020-01-11 ENCOUNTER — Telehealth: Payer: Self-pay | Admitting: *Deleted

## 2020-01-11 DIAGNOSIS — C7952 Secondary malignant neoplasm of bone marrow: Secondary | ICD-10-CM

## 2020-01-11 DIAGNOSIS — C7951 Secondary malignant neoplasm of bone: Secondary | ICD-10-CM

## 2020-01-11 NOTE — Telephone Encounter (Signed)
Called patient to remind of lab and weight appt. for 01-12-20- arrival time- 11:45 am @ [facility], spoke with patient and he is aware of these appts.

## 2020-01-12 ENCOUNTER — Telehealth: Payer: Self-pay | Admitting: Oncology

## 2020-01-12 ENCOUNTER — Other Ambulatory Visit: Payer: Self-pay

## 2020-01-12 ENCOUNTER — Ambulatory Visit
Admission: RE | Admit: 2020-01-12 | Discharge: 2020-01-12 | Disposition: A | Discharge status: Home / Self Care | Payer: Medicare Other | Source: Ambulatory Visit | Attending: Radiation Oncology | Admitting: Radiation Oncology

## 2020-01-12 DIAGNOSIS — C7951 Secondary malignant neoplasm of bone: Secondary | ICD-10-CM | POA: Diagnosis not present

## 2020-01-12 DIAGNOSIS — C7952 Secondary malignant neoplasm of bone marrow: Secondary | ICD-10-CM | POA: Insufficient documentation

## 2020-01-12 LAB — CBC WITH DIFFERENTIAL (CANCER CENTER ONLY)
Abs Immature Granulocytes: 0.02 10*3/uL (ref 0.00–0.07)
Basophils Absolute: 0 10*3/uL (ref 0.0–0.1)
Basophils Relative: 1 %
Eosinophils Absolute: 0.1 10*3/uL (ref 0.0–0.5)
Eosinophils Relative: 2 %
HCT: 39.4 % (ref 39.0–52.0)
Hemoglobin: 13.2 g/dL (ref 13.0–17.0)
Immature Granulocytes: 0 %
Lymphocytes Relative: 29 %
Lymphs Abs: 1.6 10*3/uL (ref 0.7–4.0)
MCH: 31.6 pg (ref 26.0–34.0)
MCHC: 33.5 g/dL (ref 30.0–36.0)
MCV: 94.3 fL (ref 80.0–100.0)
Monocytes Absolute: 0.5 10*3/uL (ref 0.1–1.0)
Monocytes Relative: 8 %
Neutro Abs: 3.4 10*3/uL (ref 1.7–7.7)
Neutrophils Relative %: 60 %
Platelet Count: 180 10*3/uL (ref 150–400)
RBC: 4.18 MIL/uL — ABNORMAL LOW (ref 4.22–5.81)
RDW: 12.7 % (ref 11.5–15.5)
WBC Count: 5.6 10*3/uL (ref 4.0–10.5)
nRBC: 0 % (ref 0.0–0.2)

## 2020-01-12 NOTE — Telephone Encounter (Signed)
Scheduled appointment per RN Natalie's directions. Patient came in saying that he was supposed to be scheduled for an injection this month, but no one called him about scheduling it. I called RN Lanelle Bal who looked at the patient's chart and said that he does need it every 28 days. Appointment was then scheduled accordingly and patient was made aware of appointment date and time.

## 2020-01-14 ENCOUNTER — Ambulatory Visit: Payer: Medicare Other

## 2020-01-14 ENCOUNTER — Other Ambulatory Visit: Payer: Medicare Other

## 2020-01-18 ENCOUNTER — Telehealth: Payer: Self-pay | Admitting: *Deleted

## 2020-01-18 NOTE — Telephone Encounter (Signed)
CALLED PATIENT TO REMIND TO BE IN RADIOLOGY ON 01-19-20 @ 11:15 AM, SPOKE WITH PATIENT AND HE IS AWARE OF THIS INJECTION

## 2020-01-19 ENCOUNTER — Inpatient Hospital Stay: Payer: Medicare Other | Attending: Oncology

## 2020-01-19 ENCOUNTER — Ambulatory Visit (HOSPITAL_COMMUNITY)
Admission: RE | Admit: 2020-01-19 | Discharge: 2020-01-19 | Disposition: A | Discharge status: Home / Self Care | Payer: Medicare Other | Source: Ambulatory Visit | Attending: Radiation Oncology | Admitting: Radiation Oncology

## 2020-01-19 ENCOUNTER — Inpatient Hospital Stay: Payer: Medicare Other

## 2020-01-19 ENCOUNTER — Other Ambulatory Visit: Payer: Self-pay

## 2020-01-19 DIAGNOSIS — C61 Malignant neoplasm of prostate: Secondary | ICD-10-CM | POA: Diagnosis present

## 2020-01-19 DIAGNOSIS — C7951 Secondary malignant neoplasm of bone: Secondary | ICD-10-CM | POA: Insufficient documentation

## 2020-01-19 DIAGNOSIS — Z79899 Other long term (current) drug therapy: Secondary | ICD-10-CM | POA: Insufficient documentation

## 2020-01-19 LAB — CBC WITH DIFFERENTIAL (CANCER CENTER ONLY)
Abs Immature Granulocytes: 0.01 10*3/uL (ref 0.00–0.07)
Basophils Absolute: 0 10*3/uL (ref 0.0–0.1)
Basophils Relative: 1 %
Eosinophils Absolute: 0.1 10*3/uL (ref 0.0–0.5)
Eosinophils Relative: 2 %
HCT: 40.8 % (ref 39.0–52.0)
Hemoglobin: 13.6 g/dL (ref 13.0–17.0)
Immature Granulocytes: 0 %
Lymphocytes Relative: 28 %
Lymphs Abs: 1.7 10*3/uL (ref 0.7–4.0)
MCH: 31.4 pg (ref 26.0–34.0)
MCHC: 33.3 g/dL (ref 30.0–36.0)
MCV: 94.2 fL (ref 80.0–100.0)
Monocytes Absolute: 0.5 10*3/uL (ref 0.1–1.0)
Monocytes Relative: 8 %
Neutro Abs: 3.8 10*3/uL (ref 1.7–7.7)
Neutrophils Relative %: 61 %
Platelet Count: 186 10*3/uL (ref 150–400)
RBC: 4.33 MIL/uL (ref 4.22–5.81)
RDW: 12.5 % (ref 11.5–15.5)
WBC Count: 6.1 10*3/uL (ref 4.0–10.5)
nRBC: 0 % (ref 0.0–0.2)

## 2020-01-19 LAB — CMP (CANCER CENTER ONLY)
ALT: 10 U/L (ref 0–44)
AST: 14 U/L — ABNORMAL LOW (ref 15–41)
Albumin: 3.6 g/dL (ref 3.5–5.0)
Alkaline Phosphatase: 90 U/L (ref 38–126)
Anion gap: 9 (ref 5–15)
BUN: 19 mg/dL (ref 8–23)
CO2: 23 mmol/L (ref 22–32)
Calcium: 8.9 mg/dL (ref 8.9–10.3)
Chloride: 108 mmol/L (ref 98–111)
Creatinine: 0.96 mg/dL (ref 0.61–1.24)
GFR, Estimated: >60 mL/min (ref >60)
Glucose, Bld: 96 mg/dL (ref 70–99)
Potassium: 4.4 mmol/L (ref 3.5–5.1)
Sodium: 140 mmol/L (ref 135–145)
Total Bilirubin: 0.5 mg/dL (ref 0.3–1.2)
Total Protein: 7.2 g/dL (ref 6.5–8.1)

## 2020-01-19 MED ORDER — DENOSUMAB 120 MG/1.7ML ~~LOC~~ SOLN
120.0000 mg | Freq: Once | SUBCUTANEOUS | Status: AC
  Administered 2020-01-19: 120 mg via SUBCUTANEOUS

## 2020-01-19 MED ORDER — RADIUM RA 223 DICHLORIDE 30 MCCI/ML IV SOLN
155.9500 | Freq: Once | INTRAVENOUS | Status: AC | PRN
  Administered 2020-01-19: 155.95 via INTRAVENOUS

## 2020-01-19 MED ORDER — DENOSUMAB 120 MG/1.7ML ~~LOC~~ SOLN
SUBCUTANEOUS | Status: AC
  Filled 2020-01-19: qty 1.7

## 2020-01-19 NOTE — Patient Instructions (Signed)
Denosumab injection What is this medicine? DENOSUMAB (den oh sue mab) slows bone breakdown. Prolia is used to treat osteoporosis in women after menopause and in men, and in people who are taking corticosteroids for 6 months or more. Xgeva is used to treat a high calcium level due to cancer and to prevent bone fractures and other bone problems caused by multiple myeloma or cancer bone metastases. Xgeva is also used to treat giant cell tumor of the bone. This medicine may be used for other purposes; ask your health care provider or pharmacist if you have questions. COMMON BRAND NAME(S): Prolia, XGEVA What should I tell my health care provider before I take this medicine? They need to know if you have any of these conditions:  dental disease  having surgery or tooth extraction  infection  kidney disease  low levels of calcium or Vitamin D in the blood  malnutrition  on hemodialysis  skin conditions or sensitivity  thyroid or parathyroid disease  an unusual reaction to denosumab, other medicines, foods, dyes, or preservatives  pregnant or trying to get pregnant  breast-feeding How should I use this medicine? This medicine is for injection under the skin. It is given by a health care professional in a hospital or clinic setting. A special MedGuide will be given to you before each treatment. Be sure to read this information carefully each time. For Prolia, talk to your pediatrician regarding the use of this medicine in children. Special care may be needed. For Xgeva, talk to your pediatrician regarding the use of this medicine in children. While this drug may be prescribed for children as young as 13 years for selected conditions, precautions do apply. Overdosage: If you think you have taken too much of this medicine contact a poison control center or emergency room at once. NOTE: This medicine is only for you. Do not share this medicine with others. What if I miss a dose? It is  important not to miss your dose. Call your doctor or health care professional if you are unable to keep an appointment. What may interact with this medicine? Do not take this medicine with any of the following medications:  other medicines containing denosumab This medicine may also interact with the following medications:  medicines that lower your chance of fighting infection  steroid medicines like prednisone or cortisone This list may not describe all possible interactions. Give your health care provider a list of all the medicines, herbs, non-prescription drugs, or dietary supplements you use. Also tell them if you smoke, drink alcohol, or use illegal drugs. Some items may interact with your medicine. What should I watch for while using this medicine? Visit your doctor or health care professional for regular checks on your progress. Your doctor or health care professional may order blood tests and other tests to see how you are doing. Call your doctor or health care professional for advice if you get a fever, chills or sore throat, or other symptoms of a cold or flu. Do not treat yourself. This drug may decrease your body's ability to fight infection. Try to avoid being around people who are sick. You should make sure you get enough calcium and vitamin D while you are taking this medicine, unless your doctor tells you not to. Discuss the foods you eat and the vitamins you take with your health care professional. See your dentist regularly. Brush and floss your teeth as directed. Before you have any dental work done, tell your dentist you are   receiving this medicine. Do not become pregnant while taking this medicine or for 5 months after stopping it. Talk with your doctor or health care professional about your birth control options while taking this medicine. Women should inform their doctor if they wish to become pregnant or think they might be pregnant. There is a potential for serious side  effects to an unborn child. Talk to your health care professional or pharmacist for more information. What side effects may I notice from receiving this medicine? Side effects that you should report to your doctor or health care professional as soon as possible:  allergic reactions like skin rash, itching or hives, swelling of the face, lips, or tongue  bone pain  breathing problems  dizziness  jaw pain, especially after dental work  redness, blistering, peeling of the skin  signs and symptoms of infection like fever or chills; cough; sore throat; pain or trouble passing urine  signs of low calcium like fast heartbeat, muscle cramps or muscle pain; pain, tingling, numbness in the hands or feet; seizures  unusual bleeding or bruising  unusually weak or tired Side effects that usually do not require medical attention (report to your doctor or health care professional if they continue or are bothersome):  constipation  diarrhea  headache  joint pain  loss of appetite  muscle pain  runny nose  tiredness  upset stomach This list may not describe all possible side effects. Call your doctor for medical advice about side effects. You may report side effects to FDA at 1-800-FDA-1088. Where should I keep my medicine? This medicine is only given in a clinic, doctor's office, or other health care setting and will not be stored at home. NOTE: This sheet is a summary. It may not cover all possible information. If you have questions about this medicine, talk to your doctor, pharmacist, or health care provider.  2020 Elsevier/Gold Standard (2017-06-21 16:10:44)

## 2020-01-20 LAB — PROSTATE-SPECIFIC AG, SERUM (LABCORP): Prostate Specific Ag, Serum: 201 ng/mL — ABNORMAL HIGH (ref 0.0–4.0)

## 2020-01-26 ENCOUNTER — Telehealth: Payer: Self-pay | Admitting: *Deleted

## 2020-01-26 ENCOUNTER — Other Ambulatory Visit (HOSPITAL_COMMUNITY): Payer: Self-pay | Admitting: Radiation Oncology

## 2020-01-26 DIAGNOSIS — C7951 Secondary malignant neoplasm of bone: Secondary | ICD-10-CM

## 2020-01-26 DIAGNOSIS — C61 Malignant neoplasm of prostate: Secondary | ICD-10-CM

## 2020-01-26 NOTE — Telephone Encounter (Signed)
CALLED PATIENT TO INFORM OF LAB AND WEIGHT APPT. FOR 02-16-20 @ 12 PM @ [place]. ON 02-23-20- ARRIVAL TIME- 11:45 AM @ [facility], SPOKE WITH PATIENT AND HE IS AWARE OF THESE APPTS.

## 2020-02-02 DIAGNOSIS — C7951 Secondary malignant neoplasm of bone: Secondary | ICD-10-CM | POA: Insufficient documentation

## 2020-02-02 DIAGNOSIS — C61 Malignant neoplasm of prostate: Secondary | ICD-10-CM | POA: Insufficient documentation

## 2020-02-02 NOTE — Addendum Note (Signed)
Encounter addended by: Tyler Pita, MD on: 02/02/2020 2:32 PM  Actions taken: Medication List reviewed, Problem List reviewed, Allergies reviewed, Problem List modified, Clinical Note Signed, Visit diagnoses modified

## 2020-02-02 NOTE — Progress Notes (Signed)
  Radiation Oncology         [phone] ________________________________  Name: [name] MRN: [id number]  Date: 12/18/2019  DOB: 1945/12/15  Radium-223 Infusion Note  Diagnosis:  Castration resistant prostate cancer with painful bone involvement  Current Infusion:    1  Planned Infusions:  6  Narrative: [name] presented to nuclear medicine for treatment. His most recent blood counts were reviewed.  He remains a good candidate to proceed with Ra-223.  The patient was situated in an infusion suite with a contact barrier placed under his arm. Intravenous access was established, using sterile technique, and a normal saline infusion from a syringe was started.  Micro-dosimetry:  The prescribed radiation activity was assayed and confirmed to be within specified tolerance.  Special Treatment Procedure - Infusion:  The nuclear medicine technologist and I personally verified the dose activity to be delivered as specified in the written directive, and verified the patient identification via 2 separate methods.  The syringe containing the dose was attached to an intravenous access and the dose delivered over a minute. No complications were noted.  The total administered dose was 164.3 microcuries.   A saline flush of the line and the syringe that contained the isotope was then performed.  The residual radioactivity in the syringe was 3.6 microcuries, so the actual infused isotope activity was 160.7 microcuries.   Pressure was applied to the venipuncture site, and a compression bandage placed.   Radiation Safety personnel were present to perform the discharge survey, as detailed on their documentation.   After a short period of observation, the patient had his IV removed.  Impression:  The patient tolerated his infusion relatively well.  Plan:  The patient will return in one month for ongoing care.    ________________________________  Sheral Apley. Tammi Klippel, M.D.

## 2020-02-02 NOTE — Progress Notes (Signed)
  Radiation Oncology         [phone] ________________________________  Name: [name] MRN: [id number]  Date: 01/19/2020  DOB: 1945-08-09  Radium-223 Infusion Note  Diagnosis:  Castration resistant prostate cancer with painful bone involvement  Current Infusion:    2  Planned Infusions:  6  Narrative: [name] presented to nuclear medicine for treatment. His most recent blood counts were reviewed.  He remains a good candidate to proceed with Ra-223.  The patient was situated in an infusion suite with a contact barrier placed under his arm. Intravenous access was established, using sterile technique, and a normal saline infusion from a syringe was started.  Micro-dosimetry:  The prescribed radiation activity was assayed and confirmed to be within specified tolerance.  Special Treatment Procedure - Infusion:  The nuclear medicine technologist and I personally verified the dose activity to be delivered as specified in the written directive, and verified the patient identification via 2 separate methods.  The syringe containing the dose was attached to an intravenous access and the dose delivered over a minute. No complications were noted.  The total administered dose was 159.8 microcuries.   A saline flush of the line and the syringe that contained the isotope was then performed.  The residual radioactivity in the syringe was 3.85 microcuries, so the actual infused isotope activity was 155.53microcuries.   Pressure was applied to the venipuncture site, and a compression bandage placed.   Radiation Safety personnel were present to perform the discharge survey, as detailed on their documentation.   After a short period of observation, the patient had his IV removed.  Impression:  The patient tolerated his infusion relatively well.  Plan:  The patient will return in one month for ongoing care.    ________________________________  Sheral Apley. Tammi Klippel, M.D.

## 2020-02-02 NOTE — Progress Notes (Signed)
  Radiation Oncology         [phone] ________________________________  Name: CORRIN HINGLE MRN: [id number]  Date: 12/18/2019  DOB: [birth year]  Xofigo Treatment Planning Note:  Diagnosis:  Castration resistant prostate cancer with painful bone involvement  Narrative: Mr.[name] is a patient who has been diagnosed with castration resistant prostate cancer with painful bone involvement.  His most recent blood counts show that he remains a good candidate to proceed with Ra-223.  The patient is going to receive Xofigo for his treatment.   Radiation Treatment Planning:  The prescribed radiation activity will be 50 kBq per kg per infusions. The plan is to offer a total of 6 IV administrations of this agent, assuming the blood counts are adequate prior to each administration, with each infusion done at 4 week intervals.  This will be done as an IV administration in the nuclear medicine department, with care to undertake all radiation protection precautions as recommended.   ________________________________  Sheral Apley. Tammi Klippel, M.D.

## 2020-02-02 NOTE — Addendum Note (Signed)
Encounter addended by: Tyler Pita, MD on: 02/02/2020 2:35 PM  Actions taken: Clinical Note Signed

## 2020-02-11 ENCOUNTER — Other Ambulatory Visit: Payer: Self-pay

## 2020-02-11 ENCOUNTER — Inpatient Hospital Stay: Payer: Medicare Other | Attending: Oncology

## 2020-02-11 ENCOUNTER — Inpatient Hospital Stay: Payer: Medicare Other

## 2020-02-11 ENCOUNTER — Inpatient Hospital Stay (HOSPITAL_BASED_OUTPATIENT_CLINIC_OR_DEPARTMENT_OTHER): Payer: Medicare Other | Admitting: Oncology

## 2020-02-11 VITALS — BP 138/70 | HR 69 | Temp 97.2°F | Resp 17 | Ht 75.0 in | Wt 239.2 lb

## 2020-02-11 DIAGNOSIS — C7951 Secondary malignant neoplasm of bone: Secondary | ICD-10-CM | POA: Diagnosis not present

## 2020-02-11 DIAGNOSIS — Z192 Hormone resistant malignancy status: Secondary | ICD-10-CM | POA: Insufficient documentation

## 2020-02-11 DIAGNOSIS — C799 Secondary malignant neoplasm of unspecified site: Secondary | ICD-10-CM | POA: Insufficient documentation

## 2020-02-11 DIAGNOSIS — C61 Malignant neoplasm of prostate: Secondary | ICD-10-CM | POA: Insufficient documentation

## 2020-02-11 DIAGNOSIS — Z923 Personal history of irradiation: Secondary | ICD-10-CM | POA: Insufficient documentation

## 2020-02-11 LAB — CMP (CANCER CENTER ONLY)
ALT: 12 U/L (ref 0–44)
AST: 16 U/L (ref 15–41)
Albumin: 3.5 g/dL (ref 3.5–5.0)
Alkaline Phosphatase: 69 U/L (ref 38–126)
Anion gap: 7 (ref 5–15)
BUN: 22 mg/dL (ref 8–23)
CO2: 26 mmol/L (ref 22–32)
Calcium: 8.7 mg/dL — ABNORMAL LOW (ref 8.9–10.3)
Chloride: 106 mmol/L (ref 98–111)
Creatinine: 1 mg/dL (ref 0.61–1.24)
GFR, Estimated: >60 mL/min (ref >60)
Glucose, Bld: 109 mg/dL — ABNORMAL HIGH (ref 70–99)
Potassium: 4.4 mmol/L (ref 3.5–5.1)
Sodium: 139 mmol/L (ref 135–145)
Total Bilirubin: 0.4 mg/dL (ref 0.3–1.2)
Total Protein: 7.2 g/dL (ref 6.5–8.1)

## 2020-02-11 LAB — CBC WITH DIFFERENTIAL (CANCER CENTER ONLY)
Abs Immature Granulocytes: 0.02 10*3/uL (ref 0.00–0.07)
Basophils Absolute: 0 10*3/uL (ref 0.0–0.1)
Basophils Relative: 1 %
Eosinophils Absolute: 0.1 10*3/uL (ref 0.0–0.5)
Eosinophils Relative: 3 %
HCT: 38.5 % — ABNORMAL LOW (ref 39.0–52.0)
Hemoglobin: 12.7 g/dL — ABNORMAL LOW (ref 13.0–17.0)
Immature Granulocytes: 0 %
Lymphocytes Relative: 29 %
Lymphs Abs: 1.5 10*3/uL (ref 0.7–4.0)
MCH: 31.1 pg (ref 26.0–34.0)
MCHC: 33 g/dL (ref 30.0–36.0)
MCV: 94.4 fL (ref 80.0–100.0)
Monocytes Absolute: 0.4 10*3/uL (ref 0.1–1.0)
Monocytes Relative: 8 %
Neutro Abs: 3.1 10*3/uL (ref 1.7–7.7)
Neutrophils Relative %: 59 %
Platelet Count: 163 10*3/uL (ref 150–400)
RBC: 4.08 MIL/uL — ABNORMAL LOW (ref 4.22–5.81)
RDW: 12.7 % (ref 11.5–15.5)
WBC Count: 5.2 10*3/uL (ref 4.0–10.5)
nRBC: 0 % (ref 0.0–0.2)

## 2020-02-11 MED ORDER — DENOSUMAB 120 MG/1.7ML ~~LOC~~ SOLN
120.0000 mg | Freq: Once | SUBCUTANEOUS | Status: AC
  Administered 2020-02-11: 16:00:00 120 mg via SUBCUTANEOUS

## 2020-02-11 NOTE — Patient Instructions (Signed)
Denosumab injection What is this medicine? DENOSUMAB (den oh sue mab) slows bone breakdown. Prolia is used to treat osteoporosis in women after menopause and in men, and in people who are taking corticosteroids for 6 months or more. Xgeva is used to treat a high calcium level due to cancer and to prevent bone fractures and other bone problems caused by multiple myeloma or cancer bone metastases. Xgeva is also used to treat giant cell tumor of the bone. This medicine may be used for other purposes; ask your health care provider or pharmacist if you have questions. COMMON BRAND NAME(S): Prolia, XGEVA What should I tell my health care provider before I take this medicine? They need to know if you have any of these conditions:  dental disease  having surgery or tooth extraction  infection  kidney disease  low levels of calcium or Vitamin D in the blood  malnutrition  on hemodialysis  skin conditions or sensitivity  thyroid or parathyroid disease  an unusual reaction to denosumab, other medicines, foods, dyes, or preservatives  pregnant or trying to get pregnant  breast-feeding How should I use this medicine? This medicine is for injection under the skin. It is given by a health care professional in a hospital or clinic setting. A special MedGuide will be given to you before each treatment. Be sure to read this information carefully each time. For Prolia, talk to your pediatrician regarding the use of this medicine in children. Special care may be needed. For Xgeva, talk to your pediatrician regarding the use of this medicine in children. While this drug may be prescribed for children as young as 13 years for selected conditions, precautions do apply. Overdosage: If you think you have taken too much of this medicine contact a poison control center or emergency room at once. NOTE: This medicine is only for you. Do not share this medicine with others. What if I miss a dose? It is  important not to miss your dose. Call your doctor or health care professional if you are unable to keep an appointment. What may interact with this medicine? Do not take this medicine with any of the following medications:  other medicines containing denosumab This medicine may also interact with the following medications:  medicines that lower your chance of fighting infection  steroid medicines like prednisone or cortisone This list may not describe all possible interactions. Give your health care provider a list of all the medicines, herbs, non-prescription drugs, or dietary supplements you use. Also tell them if you smoke, drink alcohol, or use illegal drugs. Some items may interact with your medicine. What should I watch for while using this medicine? Visit your doctor or health care professional for regular checks on your progress. Your doctor or health care professional may order blood tests and other tests to see how you are doing. Call your doctor or health care professional for advice if you get a fever, chills or sore throat, or other symptoms of a cold or flu. Do not treat yourself. This drug may decrease your body's ability to fight infection. Try to avoid being around people who are sick. You should make sure you get enough calcium and vitamin D while you are taking this medicine, unless your doctor tells you not to. Discuss the foods you eat and the vitamins you take with your health care professional. See your dentist regularly. Brush and floss your teeth as directed. Before you have any dental work done, tell your dentist you are   receiving this medicine. Do not become pregnant while taking this medicine or for 5 months after stopping it. Talk with your doctor or health care professional about your birth control options while taking this medicine. Women should inform their doctor if they wish to become pregnant or think they might be pregnant. There is a potential for serious side  effects to an unborn child. Talk to your health care professional or pharmacist for more information. What side effects may I notice from receiving this medicine? Side effects that you should report to your doctor or health care professional as soon as possible:  allergic reactions like skin rash, itching or hives, swelling of the face, lips, or tongue  bone pain  breathing problems  dizziness  jaw pain, especially after dental work  redness, blistering, peeling of the skin  signs and symptoms of infection like fever or chills; cough; sore throat; pain or trouble passing urine  signs of low calcium like fast heartbeat, muscle cramps or muscle pain; pain, tingling, numbness in the hands or feet; seizures  unusual bleeding or bruising  unusually weak or tired Side effects that usually do not require medical attention (report to your doctor or health care professional if they continue or are bothersome):  constipation  diarrhea  headache  joint pain  loss of appetite  muscle pain  runny nose  tiredness  upset stomach This list may not describe all possible side effects. Call your doctor for medical advice about side effects. You may report side effects to FDA at 1-800-FDA-1088. Where should I keep my medicine? This medicine is only given in a clinic, doctor's office, or other health care setting and will not be stored at home. NOTE: This sheet is a summary. It may not cover all possible information. If you have questions about this medicine, talk to your doctor, pharmacist, or health care provider.  2020 Elsevier/Gold Standard (2017-06-21 16:10:44)

## 2020-02-11 NOTE — Progress Notes (Signed)
Hematology and Oncology Follow Up Visit  [name] [id number] 1946-02-19 74 y.o. 02/11/2020 2:39 PM   Principle Diagnosis: 74 year old man with advanced prostate cancer with disease to the bone and lymphadenopathy diagnosed in 2013.  He has castration-resistant disease after presenting with Gleason score 4 + 3 equals 7 and PSA 6.45 in 2003.  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  Prior Therapy:  1. He is status post a prostatectomy and lymph node dissection done on November 05, 2001.   2. S/P 6480 cGy of radiation therapy in 36 fractions between February and April 2005.   3. He developed advanced disease and was treated with Lupron in 2011.  His PSA nadir down to 4.11. PSA was up to 5.91 in December of 2012 with a testosterone level of 35. His staging workup did not reveal any bony metastasis.  4. Casodex was added to Lupron on 05/2011. This was discontinued in January 2016 due to progression of disease.  5. Zytiga 1000 mg daily started on 05/13/2014.  Therapy discontinued in December 2018 because of progression of disease. Bone scan of the time showed limited metastatic disease.  6.  He is status post radiation to pelvic lymph nodes between October 5 and December 11, 2018.  He received total of 40 Gray in 5 fractions.  7. Xtandi 160 mg daily started in December 2018.  Therapy discontinued in September 2021.  Current therapy:   Xofigo monthly infusion started in October 2021.  He is scheduled for his 3red infusion in December 2021.  Eligard 30 mg every 4 months.  Last injection given in October 2021.  Xgeva 120 mg given every 4 weeks started in October 2021.  Interim History: [name] returns today for a follow-up visit.  Since the last visit, he completed 2 months of Xofigo without complications.  He denies bone pain or pathological fractures.  He denies any excessive fatigue or tiredness.  He denies any  hematochezia or melena.          Medications: Unchanged on review. Current Outpatient Medications  Medication Sig Dispense Refill  . ALPRAZolam (XANAX) 0.5 MG tablet Take 0.5 mg by mouth 3 (three) times daily as needed for sleep. (Patient not taking: Reported on 11/26/2019)    . amLODipine (NORVASC) 5 MG tablet amlodipine 5 mg tablet  Take 1 tablet every day by oral route.    . Ascorbic Acid (VITAMIN C) 100 MG CHEW Vitamin C  1 once a day    . aspirin EC 81 MG tablet Take 81 mg by mouth daily.    . Bergamot Oil OIL Bergamot  Take 1 or 2 capsules daily (Patient not taking: Reported on 11/26/2019)    . calcium-vitamin D (OSCAL 500/200 D-3) 500-200 MG-UNIT tablet Take 1 tablet by mouth daily with breakfast. 1 tablet   . Cholecalciferol (VITAMIN D3) 3000 units TABS cholecalciferol (vitamin D3) 1,000 unit tablet  1 tablet orally once a day    . Leuprolide Acetate (LUPRON DEPOT IM) Inject into the muscle every 4 (four) months.    . Multiple Vitamins-Minerals (MULTIVITAMIN ADULT PO) multivitamin  1 tablet orally once a day    . NON FORMULARY CBD oil 500 mg  Take 1 dropperful under tongue in the evening or at bedtime (Patient not taking: Reported on 11/26/2019)    . Omega-3 Fatty Acids (FISH OIL) 1000 MG CAPS Fish Oil  2 tablet po daily    . XTANDI 40 MG  tablet TAKE 4 TABLETS (160 MG TOTAL) BY MOUTH DAILY. (Patient not taking: Reported on 11/26/2019) 120 tablet 0   No current facility-administered medications for this visit.    Allergies:  Allergies  Allergen Reactions  . Penicillins Rash and Other (See Comments)    Has patient had a PCN reaction causing immediate rash, facial/tongue/throat swelling, SOB or lightheadedness with hypotension: YES Has patient had a PCN reaction causing severe rash involving mucus membranes or skin necrosis: YES Has patient had a PCN reaction that required hospitalization: NO Has patient had a PCN reaction occurring within the last 10 years: NO        Physical Exam:      Blood pressure 138/70, pulse 69, temperature (!) 97.2 F (36.2 C), temperature source Tympanic, resp. rate 17, height 6\' 3"  (9.390 m), weight 239 lb 3.2 oz (108.5 kg), SpO2 100 %.     ECOG: 1    General appearance: Comfortable appearing without any discomfort Head: Normocephalic without any trauma Oropharynx: Mucous membranes are moist and pink without any thrush or ulcers. Eyes: Pupils are equal and round reactive to light. Lymph nodes: No cervical, supraclavicular, inguinal or axillary lymphadenopathy.   Heart:regular rate and rhythm.  S1 and S2 without leg edema. Lung: Clear without any rhonchi or wheezes.  No dullness to percussion. Abdomin: Soft, nontender, nondistended with good bowel sounds.  No hepatosplenomegaly. Musculoskeletal: No joint deformity or effusion.  Full range of motion noted. Neurological: No deficits noted on motor, sensory and deep tendon reflex exam. Skin: No petechial rash or dryness.  Appeared moist.            Lab Results: Lab Results  Component Value Date   WBC 6.1 01/19/2020   HGB 13.6 01/19/2020   HCT 40.8 01/19/2020   MCV 94.2 01/19/2020   PLT 186 01/19/2020     Chemistry      Component Value Date/Time   NA 140 01/19/2020 1100   NA 141 02/01/2017 0749   K 4.4 01/19/2020 1100   K 3.5 02/01/2017 0749   CL 108 01/19/2020 1100   CL 104 06/11/2012 0817   CO2 23 01/19/2020 1100   CO2 26 02/01/2017 0749   BUN 19 01/19/2020 1100   BUN 14.5 02/01/2017 0749   CREATININE 0.96 01/19/2020 1100   CREATININE 0.9 02/01/2017 0749      Component Value Date/Time   CALCIUM 8.9 01/19/2020 1100   CALCIUM 9.2 02/01/2017 0749   ALKPHOS 90 01/19/2020 1100   ALKPHOS 70 02/01/2017 0749   AST 14 (L) 01/19/2020 1100   AST 17 02/01/2017 0749   ALT 10 01/19/2020 1100   ALT 14 02/01/2017 0749   BILITOT 0.5 01/19/2020 1100   BILITOT 0.95 02/01/2017 0749      Results for [name] (MRN [id number]) as of  02/11/2020 14:41  Ref. Range 12/11/2019 11:57 12/17/2019 09:48 01/19/2020 11:00  Prostate Specific Ag, Serum Latest Ref Range: 0.0 - 4.0 ng/mL 208.0 (H) 202.0 (H) 201.0 (H)   Impression and Plan:  74 year old man with:  1.  Advanced prostate cancer with disease to the bone diagnosed in 2013.  He has castration-resistant currently.  He is currently on Xofigo which he completed 2 months without any complications.  Laboratory data in the last 2 months reviewed with stabilization of his PSA and decline in his alkaline phosphatase.  Risks and benefits of continuing this treatment for the time being were reviewed.  Alternative treatment options such as systemic chemotherapy were discussed.  For  the time being, I recommended continuing Xofigo.    2. Androgen deprivation.  Next Eligard will be given in February 2022.  Long-term complications including weight gain, hot flashes were reviewed.  I recommended continuing this indefinitely.  3.  Bone directed therapy: He is currently on Xgeva which will be continued on a monthly basis.  4. Hypertension: His blood pressure remains under excellent control after the discontinuation of Xtandi.  5. Follow up: In 3 months for repeat follow-up.  30 minutes were dedicated to this visit.  The time was spent on reviewing disease status, reviewing laboratory data and alternative treatment options for the future.  Zola Button, MD 12/16/20212:39 PM

## 2020-02-12 ENCOUNTER — Telehealth: Payer: Self-pay

## 2020-02-12 LAB — PROSTATE-SPECIFIC AG, SERUM (LABCORP): Prostate Specific Ag, Serum: 198 ng/mL — ABNORMAL HIGH (ref 0.0–4.0)

## 2020-02-12 NOTE — Telephone Encounter (Signed)
Called patient and let him know that per Dr. Alen Blew PSA is down. Patient verbalized understanding.

## 2020-02-12 NOTE — Telephone Encounter (Signed)
-----   Message from [name], MD sent at 02/12/2020  9:44 AM EST ----- Please let him know his PSA is down

## 2020-02-16 ENCOUNTER — Ambulatory Visit: Payer: Medicare Other

## 2020-02-22 ENCOUNTER — Telehealth: Payer: Self-pay | Admitting: *Deleted

## 2020-02-22 NOTE — Telephone Encounter (Signed)
Called patient to remind of Xofigo Inj. for 02-23-20 - arrival time- 11:45 am @ [facility], spoke with patient and he is aware of this inj.

## 2020-02-23 ENCOUNTER — Other Ambulatory Visit: Payer: Self-pay

## 2020-02-23 ENCOUNTER — Encounter (HOSPITAL_COMMUNITY)
Admission: RE | Admit: 2020-02-23 | Discharge: 2020-02-23 | Disposition: A | Discharge status: Home / Self Care | Payer: Medicare Other | Source: Ambulatory Visit | Attending: Radiation Oncology | Admitting: Radiation Oncology

## 2020-02-23 DIAGNOSIS — C61 Malignant neoplasm of prostate: Secondary | ICD-10-CM | POA: Diagnosis not present

## 2020-02-23 DIAGNOSIS — C7951 Secondary malignant neoplasm of bone: Secondary | ICD-10-CM | POA: Insufficient documentation

## 2020-02-23 MED ORDER — RADIUM RA 223 DICHLORIDE 30 MCCI/ML IV SOLN
157.0000 | Freq: Once | INTRAVENOUS | Status: AC | PRN
  Administered 2020-02-23: 13:00:00 157 via INTRAVENOUS

## 2020-02-23 NOTE — Progress Notes (Signed)
  Radiation Oncology         [phone] ________________________________  Name: [name] MRN: [id number]  Date: 02/23/2020  DOB: 05-Nov-1945  Radium-223 Infusion Note  Diagnosis:  Castration resistant prostate cancer with painful bone involvement  Current Infusion:    2  Planned Infusions:  6  Narrative: [name] presented to nuclear medicine for treatment. His most recent blood counts were reviewed.  He remains a good candidate to proceed with Ra-223.  The patient was situated in an infusion suite with a contact barrier placed under his arm. Intravenous access was established, using sterile technique, and a normal saline infusion from a syringe was started.  Micro-dosimetry:  The prescribed radiation activity was assayed and confirmed to be within specified tolerance.  Special Treatment Procedure - Infusion:  The nuclear medicine technologist and I personally verified the dose activity to be delivered as specified in the written directive, and verified the patient identification via 2 separate methods.  The syringe containing the dose was attached to an intravenous access and the dose delivered over a minute. No complications were noted.  The total administered dose was 160.2 microcuries.   A saline flush of the line and the syringe that contained the isotope was then performed.  The residual radioactivity in the syringe was 3.2 microcuries, so the actual infused isotope activity was 157.0 microcuries.   Pressure was applied to the venipuncture site, and a compression bandage placed.   Radiation Safety personnel were present to perform the discharge survey, as detailed on their documentation.   After a short period of observation, the patient had his IV removed.  Impression:  The patient tolerated his infusion relatively well.  Plan:  The patient will return in one month for ongoing care.    ________________________________  Artist Pais. Kathrynn Running, M.D.

## 2020-03-03 ENCOUNTER — Telehealth: Payer: Self-pay | Admitting: *Deleted

## 2020-03-03 ENCOUNTER — Other Ambulatory Visit (HOSPITAL_COMMUNITY): Payer: Self-pay | Admitting: Radiation Oncology

## 2020-03-03 DIAGNOSIS — C61 Malignant neoplasm of prostate: Secondary | ICD-10-CM

## 2020-03-03 NOTE — Telephone Encounter (Signed)
CALLED PATIENT TO INFORM OF LABS AND WEIGHT FOR 03-18-20- ARRIVAL TIME- 11:45 AM @ [facility] AND HIS XOFIGO INJ. ON 03-25-20- ARRIVAL TIME - 11:45 AM @ [facility], SPOKE WITH PATIENT AND HE IS AWARE OF THESE APPTS.

## 2020-03-15 NOTE — Addendum Note (Signed)
Encounter addended by: Tyler Pita, MD on: 03/15/2020 3:19 PM  Actions taken: Clinical Note Signed

## 2020-03-17 ENCOUNTER — Telehealth: Payer: Self-pay | Admitting: *Deleted

## 2020-03-17 ENCOUNTER — Other Ambulatory Visit: Payer: Self-pay | Admitting: Radiation Oncology

## 2020-03-17 DIAGNOSIS — C61 Malignant neoplasm of prostate: Secondary | ICD-10-CM

## 2020-03-17 NOTE — Telephone Encounter (Signed)
Called patient to remind of lab and weight for 03-18-20 @ 12 pm @ [place], lvm for a return call

## 2020-03-18 ENCOUNTER — Other Ambulatory Visit: Payer: Self-pay

## 2020-03-18 ENCOUNTER — Ambulatory Visit
Admission: RE | Admit: 2020-03-18 | Discharge: 2020-03-18 | Disposition: A | Discharge status: Home / Self Care | Payer: Medicare Other | Source: Ambulatory Visit | Attending: Radiation Oncology | Admitting: Radiation Oncology

## 2020-03-18 DIAGNOSIS — C772 Secondary and unspecified malignant neoplasm of intra-abdominal lymph nodes: Secondary | ICD-10-CM | POA: Diagnosis not present

## 2020-03-18 DIAGNOSIS — C61 Malignant neoplasm of prostate: Secondary | ICD-10-CM | POA: Diagnosis not present

## 2020-03-18 LAB — CMP (CANCER CENTER ONLY)
ALT: 12 U/L (ref 0–44)
AST: 15 U/L (ref 15–41)
Albumin: 3.4 g/dL — ABNORMAL LOW (ref 3.5–5.0)
Alkaline Phosphatase: 62 U/L (ref 38–126)
Anion gap: 9 (ref 5–15)
BUN: 18 mg/dL (ref 8–23)
CO2: 26 mmol/L (ref 22–32)
Calcium: 8.9 mg/dL (ref 8.9–10.3)
Chloride: 106 mmol/L (ref 98–111)
Creatinine: 0.98 mg/dL (ref 0.61–1.24)
GFR, Estimated: >60 mL/min (ref >60)
Glucose, Bld: 111 mg/dL — ABNORMAL HIGH (ref 70–99)
Potassium: 4.7 mmol/L (ref 3.5–5.1)
Sodium: 141 mmol/L (ref 135–145)
Total Bilirubin: 0.5 mg/dL (ref 0.3–1.2)
Total Protein: 7.1 g/dL (ref 6.5–8.1)

## 2020-03-18 LAB — CBC WITH DIFFERENTIAL (CANCER CENTER ONLY)
Abs Immature Granulocytes: 0.02 10*3/uL (ref 0.00–0.07)
Basophils Absolute: 0 10*3/uL (ref 0.0–0.1)
Basophils Relative: 0 %
Eosinophils Absolute: 0.2 10*3/uL (ref 0.0–0.5)
Eosinophils Relative: 3 %
HCT: 38.7 % — ABNORMAL LOW (ref 39.0–52.0)
Hemoglobin: 12.7 g/dL — ABNORMAL LOW (ref 13.0–17.0)
Immature Granulocytes: 0 %
Lymphocytes Relative: 22 %
Lymphs Abs: 1.1 10*3/uL (ref 0.7–4.0)
MCH: 31 pg (ref 26.0–34.0)
MCHC: 32.8 g/dL (ref 30.0–36.0)
MCV: 94.4 fL (ref 80.0–100.0)
Monocytes Absolute: 0.4 10*3/uL (ref 0.1–1.0)
Monocytes Relative: 7 %
Neutro Abs: 3.4 10*3/uL (ref 1.7–7.7)
Neutrophils Relative %: 68 %
Platelet Count: 169 10*3/uL (ref 150–400)
RBC: 4.1 MIL/uL — ABNORMAL LOW (ref 4.22–5.81)
RDW: 12.7 % (ref 11.5–15.5)
WBC Count: 5 10*3/uL (ref 4.0–10.5)
nRBC: 0 % (ref 0.0–0.2)

## 2020-03-19 LAB — PROSTATE-SPECIFIC AG, SERUM (LABCORP): Prostate Specific Ag, Serum: 197 ng/mL — ABNORMAL HIGH (ref 0.0–4.0)

## 2020-03-24 ENCOUNTER — Telehealth: Payer: Self-pay | Admitting: *Deleted

## 2020-03-24 NOTE — Telephone Encounter (Signed)
Called patient to remind of Xofigo Inj. for 03-25-20- arrival time- 11:45 am @ [facility] Rad[facility], lvm for a return call

## 2020-03-25 ENCOUNTER — Other Ambulatory Visit: Payer: Self-pay

## 2020-03-25 ENCOUNTER — Ambulatory Visit (HOSPITAL_COMMUNITY)
Admission: RE | Admit: 2020-03-25 | Discharge: 2020-03-25 | Disposition: A | Discharge status: Home / Self Care | Payer: Medicare Other | Source: Ambulatory Visit | Attending: Radiation Oncology | Admitting: Radiation Oncology

## 2020-03-25 DIAGNOSIS — C61 Malignant neoplasm of prostate: Secondary | ICD-10-CM | POA: Diagnosis present

## 2020-03-25 DIAGNOSIS — C7951 Secondary malignant neoplasm of bone: Secondary | ICD-10-CM | POA: Insufficient documentation

## 2020-03-25 MED ORDER — RADIUM RA 223 DICHLORIDE 30 MCCI/ML IV SOLN
166.3000 | Freq: Once | INTRAVENOUS | Status: AC
  Administered 2020-03-25: 166.3 via INTRAVENOUS

## 2020-03-29 NOTE — Progress Notes (Signed)
  Radiation Oncology         [phone] ________________________________  Name: [name] MRN: [id number]  Date: 03/25/2020  DOB: 11-08-1945  Radium-223 Infusion Note  Diagnosis:  Castration resistant prostate cancer with painful bone involvement  Current Infusion:    4  Planned Infusions:  6  Narrative: [name] presented to nuclear medicine for treatment. His most recent blood counts were reviewed.  He remains a good candidate to proceed with Ra-223.  The patient was situated in an infusion suite with a contact barrier placed under his arm. Intravenous access was established, using sterile technique, and a normal saline infusion from a syringe was started.  Micro-dosimetry:  The prescribed radiation activity was assayed and confirmed to be within specified tolerance.  Special Treatment Procedure - Infusion:  The nuclear medicine technologist and I personally verified the dose activity to be delivered as specified in the written directive, and verified the patient identification via 2 separate methods.  The syringe containing the dose was attached to an intravenous access and the dose delivered over a minute. No complications were noted.  The total administered dose was 170.2 microcuries.   A saline flush of the line and the syringe that contained the isotope was then performed.  The residual radioactivity in the syringe was 3.9 microcuries, so the actual infused isotope activity was 166.3 microcuries.   Pressure was applied to the venipuncture site, and a compression bandage placed.   Radiation Safety personnel were present to perform the discharge survey, as detailed on their documentation.   After a short period of observation, the patient had his IV removed.  Impression:  The patient tolerated his infusion relatively well.  Plan:  The patient will return in one month for ongoing care.    ________________________________  Sheral Apley. Tammi Klippel, M.D.

## 2020-04-05 ENCOUNTER — Other Ambulatory Visit (HOSPITAL_COMMUNITY): Payer: Self-pay | Admitting: Radiation Oncology

## 2020-04-05 ENCOUNTER — Telehealth: Payer: Self-pay | Admitting: *Deleted

## 2020-04-05 DIAGNOSIS — C61 Malignant neoplasm of prostate: Secondary | ICD-10-CM

## 2020-04-05 NOTE — Telephone Encounter (Signed)
CALLED PATIENT TO INFORM OF LAB AND WEIGHT ON 04-15-20 @ 12 PM @ [place]. ON 04-22-20- ARRIVAL TIME- 11:45 AM @ [facility], SPOKE WITH PATIENT AND HE IS AWARE OF THESE APPTS.

## 2020-04-12 ENCOUNTER — Other Ambulatory Visit: Payer: Self-pay | Admitting: Radiation Oncology

## 2020-04-12 DIAGNOSIS — C7952 Secondary malignant neoplasm of bone marrow: Secondary | ICD-10-CM

## 2020-04-12 DIAGNOSIS — C7951 Secondary malignant neoplasm of bone: Secondary | ICD-10-CM

## 2020-04-14 ENCOUNTER — Telehealth: Payer: Self-pay | Admitting: *Deleted

## 2020-04-14 NOTE — Telephone Encounter (Signed)
CALLED PATIENT TO REMIND OF LABS AND WEIGHT FOR 04-15-20 @ 12 PM @ [place], SPOKE WITH PATIENT AND HE IS AWARE OF THIS APPT.

## 2020-04-15 ENCOUNTER — Ambulatory Visit
Admission: RE | Admit: 2020-04-15 | Discharge: 2020-04-15 | Disposition: A | Discharge status: Home / Self Care | Payer: Medicare Other | Source: Ambulatory Visit | Attending: Radiation Oncology | Admitting: Radiation Oncology

## 2020-04-15 ENCOUNTER — Other Ambulatory Visit: Payer: Self-pay

## 2020-04-15 DIAGNOSIS — C61 Malignant neoplasm of prostate: Secondary | ICD-10-CM | POA: Insufficient documentation

## 2020-04-15 DIAGNOSIS — C7952 Secondary malignant neoplasm of bone marrow: Secondary | ICD-10-CM | POA: Diagnosis present

## 2020-04-15 DIAGNOSIS — C7951 Secondary malignant neoplasm of bone: Secondary | ICD-10-CM | POA: Diagnosis present

## 2020-04-15 LAB — CBC WITH DIFFERENTIAL (CANCER CENTER ONLY)
Abs Immature Granulocytes: 0.02 10*3/uL (ref 0.00–0.07)
Basophils Absolute: 0 10*3/uL (ref 0.0–0.1)
Basophils Relative: 1 %
Eosinophils Absolute: 0.2 10*3/uL (ref 0.0–0.5)
Eosinophils Relative: 4 %
HCT: 37.9 % — ABNORMAL LOW (ref 39.0–52.0)
Hemoglobin: 12.3 g/dL — ABNORMAL LOW (ref 13.0–17.0)
Immature Granulocytes: 0 %
Lymphocytes Relative: 25 %
Lymphs Abs: 1.2 10*3/uL (ref 0.7–4.0)
MCH: 30.8 pg (ref 26.0–34.0)
MCHC: 32.5 g/dL (ref 30.0–36.0)
MCV: 94.8 fL (ref 80.0–100.0)
Monocytes Absolute: 0.4 10*3/uL (ref 0.1–1.0)
Monocytes Relative: 8 %
Neutro Abs: 2.9 10*3/uL (ref 1.7–7.7)
Neutrophils Relative %: 62 %
Platelet Count: 149 10*3/uL — ABNORMAL LOW (ref 150–400)
RBC: 4 MIL/uL — ABNORMAL LOW (ref 4.22–5.81)
RDW: 13.1 % (ref 11.5–15.5)
WBC Count: 4.8 10*3/uL (ref 4.0–10.5)
nRBC: 0 % (ref 0.0–0.2)

## 2020-04-21 ENCOUNTER — Telehealth: Payer: Self-pay | Admitting: *Deleted

## 2020-04-21 NOTE — Telephone Encounter (Signed)
CALLED PATIENT TO REMIND OF XOFIGO INJ. FOR 04-22-20- ARRIVAL TIME- 10:45 AM @ [facility], SPOKE WITH PATIENT AND HE IS AWARE OF THIS INJ.

## 2020-04-22 ENCOUNTER — Encounter (HOSPITAL_COMMUNITY)
Admission: RE | Admit: 2020-04-22 | Discharge: 2020-04-22 | Disposition: A | Discharge status: Home / Self Care | Payer: Medicare Other | Source: Ambulatory Visit | Attending: Radiation Oncology | Admitting: Radiation Oncology

## 2020-04-22 ENCOUNTER — Other Ambulatory Visit: Payer: Self-pay

## 2020-04-22 DIAGNOSIS — C7951 Secondary malignant neoplasm of bone: Secondary | ICD-10-CM | POA: Insufficient documentation

## 2020-04-22 DIAGNOSIS — C61 Malignant neoplasm of prostate: Secondary | ICD-10-CM | POA: Insufficient documentation

## 2020-04-22 MED ORDER — RADIUM RA 223 DICHLORIDE 30 MCCI/ML IV SOLN
164.9000 | Freq: Once | INTRAVENOUS | Status: AC | PRN
  Administered 2020-04-22: 164.9 via INTRAVENOUS

## 2020-04-22 NOTE — Progress Notes (Signed)
  Radiation Oncology         [phone] ________________________________  Name: [name] MRN: [id number]  Date: 04/22/2020  DOB: 1946/02/24  Radium-223 Infusion Note  Diagnosis:  Castration resistant prostate cancer with painful bone involvement  Current Infusion:    5  Planned Infusions:  6  Narrative: [name] presented to nuclear medicine for treatment. His most recent blood counts were reviewed.  He remains a good candidate to proceed with Ra-223.  The patient was situated in an infusion suite with a contact barrier placed under his arm. Intravenous access was established, using sterile technique, and a normal saline infusion from a syringe was started.  Micro-dosimetry:  The prescribed radiation activity was assayed and confirmed to be within specified tolerance.  Special Treatment Procedure - Infusion:  The nuclear medicine technologist and I personally verified the dose activity to be delivered as specified in the written directive, and verified the patient identification via 2 separate methods.  The syringe containing the dose was attached to an intravenous access and the dose delivered over a minute. No complications were noted.  The total administered dose was 168.0 microcuries.   A saline flush of the line and the syringe that contained the isotope was then performed.  The residual radioactivity in the syringe was 3.1 microcuries, so the actual infused isotope activity was 164.9 microcuries.   Pressure was applied to the venipuncture site, and a compression bandage placed.   Radiation Safety personnel were present to perform the discharge survey, as detailed on their documentation.   After a short period of observation, the patient had his IV removed.  Impression:  The patient tolerated his infusion relatively well.  Plan:  The patient will return in one month for ongoing care.    ________________________________  Sheral Apley. Tammi Klippel, M.D.

## 2020-05-03 ENCOUNTER — Telehealth: Payer: Self-pay | Admitting: *Deleted

## 2020-05-03 ENCOUNTER — Other Ambulatory Visit (HOSPITAL_COMMUNITY): Payer: Self-pay | Admitting: Radiation Oncology

## 2020-05-03 DIAGNOSIS — C61 Malignant neoplasm of prostate: Secondary | ICD-10-CM

## 2020-05-03 NOTE — Telephone Encounter (Signed)
CALLED PATIENT TO INFORM OF LAB AND WEIGHT ON 05-13-20 @ 12 PM @ [place]. ON 05-20-20 - ARRIVAL TIME- 11:45 AM @ [facility], LVM FOR A RETURN CALL

## 2020-05-12 ENCOUNTER — Telehealth: Payer: Self-pay | Admitting: *Deleted

## 2020-05-12 ENCOUNTER — Other Ambulatory Visit: Payer: Self-pay | Admitting: Radiation Oncology

## 2020-05-12 DIAGNOSIS — C7951 Secondary malignant neoplasm of bone: Secondary | ICD-10-CM

## 2020-05-12 NOTE — Telephone Encounter (Signed)
CALLED PATIENT TO REMIND OF LAB AND WEIGHT FOR 05-13-20 @ 12 PM @ [place], SPOKE WITH PATIENT AND HE IS AWARE OF THIS APPT.

## 2020-05-13 ENCOUNTER — Other Ambulatory Visit: Payer: Self-pay

## 2020-05-13 ENCOUNTER — Ambulatory Visit
Admission: RE | Admit: 2020-05-13 | Discharge: 2020-05-13 | Disposition: A | Discharge status: Home / Self Care | Payer: Medicare Other | Source: Ambulatory Visit | Attending: Radiation Oncology | Admitting: Radiation Oncology

## 2020-05-13 DIAGNOSIS — C61 Malignant neoplasm of prostate: Secondary | ICD-10-CM | POA: Insufficient documentation

## 2020-05-13 LAB — CBC WITH DIFFERENTIAL (CANCER CENTER ONLY)
Abs Immature Granulocytes: 0.01 10*3/uL (ref 0.00–0.07)
Basophils Absolute: 0 10*3/uL (ref 0.0–0.1)
Basophils Relative: 0 %
Eosinophils Absolute: 0.2 10*3/uL (ref 0.0–0.5)
Eosinophils Relative: 4 %
HCT: 37.9 % — ABNORMAL LOW (ref 39.0–52.0)
Hemoglobin: 12.6 g/dL — ABNORMAL LOW (ref 13.0–17.0)
Immature Granulocytes: 0 %
Lymphocytes Relative: 25 %
Lymphs Abs: 1.2 10*3/uL (ref 0.7–4.0)
MCH: 31.1 pg (ref 26.0–34.0)
MCHC: 33.2 g/dL (ref 30.0–36.0)
MCV: 93.6 fL (ref 80.0–100.0)
Monocytes Absolute: 0.4 10*3/uL (ref 0.1–1.0)
Monocytes Relative: 9 %
Neutro Abs: 2.9 10*3/uL (ref 1.7–7.7)
Neutrophils Relative %: 62 %
Platelet Count: 141 10*3/uL — ABNORMAL LOW (ref 150–400)
RBC: 4.05 MIL/uL — ABNORMAL LOW (ref 4.22–5.81)
RDW: 13.2 % (ref 11.5–15.5)
WBC Count: 4.7 10*3/uL (ref 4.0–10.5)
nRBC: 0 % (ref 0.0–0.2)

## 2020-05-13 LAB — CMP (CANCER CENTER ONLY)
ALT: 14 U/L (ref 0–44)
AST: 15 U/L (ref 15–41)
Albumin: 3.8 g/dL (ref 3.5–5.0)
Alkaline Phosphatase: 50 U/L (ref 38–126)
Anion gap: 6 (ref 5–15)
BUN: 22 mg/dL (ref 8–23)
CO2: 27 mmol/L (ref 22–32)
Calcium: 9.1 mg/dL (ref 8.9–10.3)
Chloride: 104 mmol/L (ref 98–111)
Creatinine: 1.03 mg/dL (ref 0.61–1.24)
GFR, Estimated: >60 mL/min (ref >60)
Glucose, Bld: 104 mg/dL — ABNORMAL HIGH (ref 70–99)
Potassium: 4.6 mmol/L (ref 3.5–5.1)
Sodium: 137 mmol/L (ref 135–145)
Total Bilirubin: 0.5 mg/dL (ref 0.3–1.2)
Total Protein: 7.4 g/dL (ref 6.5–8.1)

## 2020-05-14 LAB — PROSTATE-SPECIFIC AG, SERUM (LABCORP): Prostate Specific Ag, Serum: 229 ng/mL — ABNORMAL HIGH (ref 0.0–4.0)

## 2020-05-19 ENCOUNTER — Telehealth: Payer: Self-pay | Admitting: *Deleted

## 2020-05-19 NOTE — Telephone Encounter (Signed)
Called patient to remind of Xofigo Inj. for 05-20-20- arrival time - 11:45 am @ [facility] Rad[facility], spoke with patient and he is aware of this inj.

## 2020-05-20 ENCOUNTER — Ambulatory Visit (HOSPITAL_COMMUNITY)
Admission: RE | Admit: 2020-05-20 | Discharge: 2020-05-20 | Disposition: A | Discharge status: Home / Self Care | Payer: Medicare Other | Source: Ambulatory Visit | Attending: Radiation Oncology | Admitting: Radiation Oncology

## 2020-05-20 ENCOUNTER — Other Ambulatory Visit: Payer: Self-pay

## 2020-05-20 DIAGNOSIS — C7951 Secondary malignant neoplasm of bone: Secondary | ICD-10-CM

## 2020-05-20 DIAGNOSIS — C61 Malignant neoplasm of prostate: Secondary | ICD-10-CM | POA: Insufficient documentation

## 2020-05-20 MED ORDER — RADIUM RA 223 DICHLORIDE 30 MCCI/ML IV SOLN
167.1000 | Freq: Once | INTRAVENOUS | Status: AC | PRN
  Administered 2020-05-20: 167.1 via INTRAVENOUS

## 2020-05-26 ENCOUNTER — Other Ambulatory Visit (HOSPITAL_COMMUNITY): Payer: Self-pay

## 2020-05-31 NOTE — Progress Notes (Signed)
  Radiation Oncology         [phone] ________________________________  Name: [name] MRN: [id number]  Date: 05/20/2020  DOB: 31-Dec-1945  Radium-223 Infusion Note  Diagnosis:  Castration resistant prostate cancer with painful bone involvement  Current Infusion:    6  Planned Infusions:  6  Narrative: [name] presented to nuclear medicine for treatment. His most recent blood counts were reviewed.  He remains a good candidate to proceed with Ra-223.  The patient was situated in an infusion suite with a contact barrier placed under his arm. Intravenous access was established, using sterile technique, and a normal saline infusion from a syringe was started.  Micro-dosimetry:  The prescribed radiation activity was assayed and confirmed to be within specified tolerance.  Special Treatment Procedure - Infusion:  The nuclear medicine technologist and I personally verified the dose activity to be delivered as specified in the written directive, and verified the patient identification via 2 separate methods.  The syringe containing the dose was attached to an intravenous access and the dose delivered over a minute. No complications were noted.  The total administered dose was 170.8 microcuries.   A saline flush of the line and the syringe that contained the isotope was then performed.  The residual radioactivity in the syringe was 3.75 microcuries, so the actual infused isotope activity was 167.05 microcuries.   Pressure was applied to the venipuncture site, and a compression bandage placed.   Radiation Safety personnel were present to perform the discharge survey, as detailed on their documentation.   After a short period of observation, the patient had his IV removed.  Impression:  The patient tolerated his infusion relatively well.  His alk phos trended down during the course of therapy.  Plan:  The patient will return in one month for ongoing care.     ________________________________  Sheral Apley. Tammi Klippel, M.D.

## 2020-07-29 ENCOUNTER — Telehealth: Payer: Self-pay | Admitting: Oncology

## 2020-07-29 NOTE — Telephone Encounter (Signed)
Spoke with patient regarding scheduling conflicts and spoke with providers nurse regarding upcoming 06/09  appointments, patient is notified of these appointments and provider will be notified as well.

## 2020-08-03 ENCOUNTER — Inpatient Hospital Stay: Payer: Medicare Other | Admitting: Oncology

## 2020-08-03 ENCOUNTER — Other Ambulatory Visit: Payer: Self-pay

## 2020-08-03 ENCOUNTER — Inpatient Hospital Stay: Payer: Medicare Other | Attending: Oncology

## 2020-08-03 ENCOUNTER — Inpatient Hospital Stay: Payer: Medicare Other

## 2020-08-03 VITALS — BP 140/74 | HR 69 | Temp 97.1°F | Resp 18 | Wt 238.6 lb

## 2020-08-03 DIAGNOSIS — Z79899 Other long term (current) drug therapy: Secondary | ICD-10-CM | POA: Diagnosis not present

## 2020-08-03 DIAGNOSIS — I10 Essential (primary) hypertension: Secondary | ICD-10-CM | POA: Diagnosis not present

## 2020-08-03 DIAGNOSIS — C7951 Secondary malignant neoplasm of bone: Secondary | ICD-10-CM | POA: Diagnosis not present

## 2020-08-03 DIAGNOSIS — C61 Malignant neoplasm of prostate: Secondary | ICD-10-CM

## 2020-08-03 DIAGNOSIS — R600 Localized edema: Secondary | ICD-10-CM | POA: Insufficient documentation

## 2020-08-03 DIAGNOSIS — Z923 Personal history of irradiation: Secondary | ICD-10-CM | POA: Insufficient documentation

## 2020-08-03 DIAGNOSIS — R5383 Other fatigue: Secondary | ICD-10-CM | POA: Diagnosis not present

## 2020-08-03 DIAGNOSIS — R251 Tremor, unspecified: Secondary | ICD-10-CM | POA: Insufficient documentation

## 2020-08-03 DIAGNOSIS — Z7982 Long term (current) use of aspirin: Secondary | ICD-10-CM | POA: Insufficient documentation

## 2020-08-03 LAB — CBC WITH DIFFERENTIAL (CANCER CENTER ONLY)
Abs Immature Granulocytes: 0.02 10*3/uL (ref 0.00–0.07)
Basophils Absolute: 0 10*3/uL (ref 0.0–0.1)
Basophils Relative: 0 %
Eosinophils Absolute: 0.2 10*3/uL (ref 0.0–0.5)
Eosinophils Relative: 3 %
HCT: 34.2 % — ABNORMAL LOW (ref 39.0–52.0)
Hemoglobin: 11.7 g/dL — ABNORMAL LOW (ref 13.0–17.0)
Immature Granulocytes: 0 %
Lymphocytes Relative: 24 %
Lymphs Abs: 1.4 10*3/uL (ref 0.7–4.0)
MCH: 31.8 pg (ref 26.0–34.0)
MCHC: 34.2 g/dL (ref 30.0–36.0)
MCV: 92.9 fL (ref 80.0–100.0)
Monocytes Absolute: 0.4 10*3/uL (ref 0.1–1.0)
Monocytes Relative: 7 %
Neutro Abs: 3.8 10*3/uL (ref 1.7–7.7)
Neutrophils Relative %: 66 %
Platelet Count: 146 10*3/uL — ABNORMAL LOW (ref 150–400)
RBC: 3.68 MIL/uL — ABNORMAL LOW (ref 4.22–5.81)
RDW: 13.1 % (ref 11.5–15.5)
WBC Count: 5.9 10*3/uL (ref 4.0–10.5)
nRBC: 0 % (ref 0.0–0.2)

## 2020-08-03 LAB — CMP (CANCER CENTER ONLY)
ALT: 9 U/L (ref 0–44)
AST: 14 U/L — ABNORMAL LOW (ref 15–41)
Albumin: 3.3 g/dL — ABNORMAL LOW (ref 3.5–5.0)
Alkaline Phosphatase: 70 U/L (ref 38–126)
Anion gap: 7 (ref 5–15)
BUN: 23 mg/dL (ref 8–23)
CO2: 25 mmol/L (ref 22–32)
Calcium: 8.7 mg/dL — ABNORMAL LOW (ref 8.9–10.3)
Chloride: 107 mmol/L (ref 98–111)
Creatinine: 1.31 mg/dL — ABNORMAL HIGH (ref 0.61–1.24)
GFR, Estimated: 57 mL/min — ABNORMAL LOW (ref >60)
Glucose, Bld: 103 mg/dL — ABNORMAL HIGH (ref 70–99)
Potassium: 4.3 mmol/L (ref 3.5–5.1)
Sodium: 139 mmol/L (ref 135–145)
Total Bilirubin: 0.4 mg/dL (ref 0.3–1.2)
Total Protein: 6.8 g/dL (ref 6.5–8.1)

## 2020-08-03 MED ORDER — DENOSUMAB 120 MG/1.7ML ~~LOC~~ SOLN
SUBCUTANEOUS | Status: AC
  Filled 2020-08-03: qty 1.7

## 2020-08-03 MED ORDER — LEUPROLIDE ACETATE (4 MONTH) 30 MG ~~LOC~~ KIT
PACK | SUBCUTANEOUS | Status: AC
  Filled 2020-08-03: qty 30

## 2020-08-03 MED ORDER — DENOSUMAB 120 MG/1.7ML ~~LOC~~ SOLN: 120.0000 mg | Freq: Once | SUBCUTANEOUS | Status: DC

## 2020-08-03 MED ORDER — LEUPROLIDE ACETATE (4 MONTH) 30 MG ~~LOC~~ KIT
30.0000 mg | PACK | Freq: Once | SUBCUTANEOUS | Status: AC
  Administered 2020-08-03: 16:00:00 30 mg via SUBCUTANEOUS

## 2020-08-03 NOTE — Progress Notes (Signed)
Per Dr. Alen Blew, no xgeva injection today.

## 2020-08-03 NOTE — Progress Notes (Signed)
Hold Xgeva today per Dr. Alen Blew.  Msg recv'd from Alvino Blood, LPN.  Kennith Center, Pharm.D., CPP 08/03/2020@3 :34 PM

## 2020-08-03 NOTE — Progress Notes (Signed)
Hematology and Oncology Follow Up Visit  [name] [id number] [birth year] 75 y.o. 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  He presented with Gleason score 4 + 3 = 7 and PSA 6.45 in 2003 and localized disease.  Prior Therapy:  1. He is status post a prostatectomy and lymph node dissection done on November 05, 2001.   2. S/P 6480 cGy of radiation therapy in 36 fractions between February and April 2005.   3. He developed advanced disease and was treated with Lupron in 2011.  His PSA nadir down to 4.11. PSA was up to 5.91 in December of 2012 with a testosterone level of 35. His staging workup did not reveal any bony metastasis.  4. Casodex was added to Lupron on 05/2011. This was discontinued in January 2016 due to progression of disease.  5. Zytiga 1000 mg daily started on 05/13/2014.  Therapy discontinued in December 2018 because of progression of disease. Bone scan of the time showed limited metastatic disease.  6.  He is status post radiation to pelvic lymph nodes between October 5 and December 11, 2018.  He received total of 40 Gray in 5 fractions.  7. Xtandi 160 mg daily started in December 2018.  Therapy discontinued in September 2021.  Current therapy:   Xofigo monthly infusion started in October 2021.  He i completed therapy in March 2022.  Eligard 30 mg every 4 months.  This will be repeated today and every 4 months.  Xgeva 120 mg given every 4 weeks started in October 2021.  This will be given today and repeated in 4 months.    Interim History: [name] presents today for repeat follow-up visit.  Since the last visit, he completed Xofigo without any major complications.  He does report some residual fatigue and tiredness but still able to perform activities of daily living without any decline.  He still work as a Animator.  He has reported some resting tremor and lower extremity edema but overall manageable.  He denies any shortness of breath or difficulty breathing.  He denies any bone pain.          Medications: Updated on review. Current Outpatient Medications  Medication Sig Dispense Refill  . ALPRAZolam (XANAX) 0.5 MG tablet Take 0.5 mg by mouth 3 (three) times daily as needed for sleep. (Patient not taking: Reported on 11/26/2019)    . amLODipine (NORVASC) 5 MG tablet amlodipine 5 mg tablet  Take 1 tablet every day by oral route.    . Ascorbic Acid (VITAMIN C) 100 MG CHEW Vitamin C  1 once a day    . aspirin EC 81 MG tablet Take 81 mg by mouth daily.    . Bergamot Oil OIL Bergamot  Take 1 or 2 capsules daily (Patient not taking: Reported on 11/26/2019)    . calcium-vitamin D (OSCAL 500/200 D-3) 500-200 MG-UNIT tablet Take 1 tablet by mouth daily with breakfast. 1 tablet   . Cholecalciferol (VITAMIN D3) 3000 units TABS cholecalciferol (vitamin D3) 1,000 unit tablet  1 tablet orally once a day    . Leuprolide Acetate (LUPRON DEPOT IM) Inject into the muscle every 4 (four) months.    . Multiple Vitamins-Minerals (MULTIVITAMIN ADULT PO) multivitamin  1 tablet orally once a day    . NON FORMULARY CBD oil 500 mg  Take 1 dropperful under tongue in the evening  or at bedtime (Patient not taking: Reported on 11/26/2019)    . Omega-3 Fatty Acids (FISH OIL) 1000 MG CAPS Fish Oil  2 tablet po daily    . XTANDI 40 MG tablet TAKE 4 TABLETS (160 MG TOTAL) BY MOUTH DAILY. (Patient not taking: Reported on 11/26/2019) 120 tablet 0   No current facility-administered medications for this visit.    Allergies:  Allergies  Allergen Reactions  . Penicillins Rash and Other (See Comments)    Has patient had a PCN reaction causing immediate rash, facial/tongue/throat swelling, SOB or lightheadedness with hypotension: YES Has patient had a PCN reaction causing severe rash involving mucus membranes or skin  necrosis: YES Has patient had a PCN reaction that required hospitalization: NO Has patient had a PCN reaction occurring within the last 10 years: NO       Physical Exam:      Blood pressure 140/74, pulse 69, temperature (!) 97.1 F (36.2 C), resp. rate 18, weight 238 lb 9.6 oz (108.2 kg), SpO2 99 %.     ECOG: 1    General appearance: Alert, awake without any distress. Head: Atraumatic without abnormalities Oropharynx: Without any thrush or ulcers. Eyes: No scleral icterus. Lymph nodes: No lymphadenopathy noted in the cervical, supraclavicular, or axillary nodes Heart:regular rate and rhythm, without any murmurs or gallops.   Lung: Clear to auscultation without any rhonchi, wheezes or dullness to percussion. Abdomin: Soft, nontender without any shifting dullness or ascites. Musculoskeletal: No clubbing or cyanosis. Neurological: No motor or sensory deficits. Skin: No rashes or lesions.             Lab Results: Lab Results  Component Value Date   WBC 4.7 05/13/2020   HGB 12.6 (L) 05/13/2020   HCT 37.9 (L) 05/13/2020   MCV 93.6 05/13/2020   PLT 141 (L) 05/13/2020     Chemistry      Component Value Date/Time   NA 137 05/13/2020 1138   NA 141 02/01/2017 0749   K 4.6 05/13/2020 1138   K 3.5 02/01/2017 0749   CL 104 05/13/2020 1138   CL 104 06/11/2012 0817   CO2 27 05/13/2020 1138   CO2 26 02/01/2017 0749   BUN 22 05/13/2020 1138   BUN 14.5 02/01/2017 0749   CREATININE 1.03 05/13/2020 1138   CREATININE 0.9 02/01/2017 0749      Component Value Date/Time   CALCIUM 9.1 05/13/2020 1138   CALCIUM 9.2 02/01/2017 0749   ALKPHOS 50 05/13/2020 1138   ALKPHOS 70 02/01/2017 0749   AST 15 05/13/2020 1138   AST 17 02/01/2017 0749   ALT 14 05/13/2020 1138   ALT 14 02/01/2017 0749   BILITOT 0.5 05/13/2020 1138   BILITOT 0.95 02/01/2017 0749      Results for Nichols, George BRAU (MRN [id number]) as of 02/11/2020 14:41  Ref. Range 12/11/2019 11:57  12/17/2019 09:48 01/19/2020 11:00  Prostate Specific Ag, Serum Latest Ref Range: 0.0 - 4.0 ng/mL 208.0 (H) 202.0 (H) 201.0 (H)   Impression and Plan:  75 year old man with:  1.  Castration-resistant advanced prostate cancer with disease to the bone diagnosed in 2013.    His disease status was updated at this time and treatment options were reviewed.  He has completed Xofigo without any major complications.  I recommended updating his staging scans including PSMA PET to determine the extent of his disease and treatment options.  At this time, his options include systemic chemotherapy versus supportive care only versus off label use of oral  targeted therapy.  After discussion today, he has still wants to defer any additional imaging studies as well as treatment for the time being till he is fully recovered.  He is not physically or mentally ready to undergo any additional treatment.  For the time being I recommended close observation and surveillance and reevaluate in 2 months.  He also understands that his cancer can progress rapidly and close the window and any additional treatment.  He understands and takes his risk at this time.    2. Androgen deprivation.  He will receive Eligard today and repeated in 4 months.  Complications that include weight gain, hot flashes and sexual dysfunction were discussed.  3.  Bone directed therapy: We will continue to be on Xgeva which will be updated today.  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  His calcium is low at this time and I instructed him to increase his calcium intake.  We will hold Xgeva for the time being.  4. Hypertension: His blood pressure close to normal range at this time.  5.  Prognosis and goals of care: His disease remains incurable although aggressive measures are warranted given his reasonable performance status.  He still values quality of life and wants to avoid systemic chemotherapy as long as  possible.  6. Follow up: Will be in 2 months for repeat follow-up.  30 minutes were spent on this encounter.  The time was dedicated to reviewing laboratory data, disease status update, treatment options and answering questions about prognosis.  Zola Button, MD 6/8/20222:18 PM

## 2020-08-04 ENCOUNTER — Inpatient Hospital Stay: Payer: Medicare Other

## 2020-08-04 ENCOUNTER — Telehealth: Payer: Self-pay | Admitting: *Deleted

## 2020-08-04 ENCOUNTER — Inpatient Hospital Stay: Payer: Medicare Other | Admitting: Oncology

## 2020-08-04 LAB — PROSTATE-SPECIFIC AG, SERUM (LABCORP): Prostate Specific Ag, Serum: 303 ng/mL — ABNORMAL HIGH (ref 0.0–4.0)

## 2020-08-04 NOTE — Telephone Encounter (Signed)
Attempted to reach patient to review PSA lab result levels per Dr Hazeline Junker request.    Left message for return call to discuss.  Pending call back.

## 2020-08-04 NOTE — Telephone Encounter (Signed)
Patient called back and I communicated lab results to him.  No further questions at this time.

## 2020-08-04 NOTE — Telephone Encounter (Signed)
-----   Message from [name], MD sent at 08/04/2020  8:44 AM EDT ----- Please let him know all of the PSA was up but expected at this time.  This was discussed during her visit yesterday.

## 2020-08-04 NOTE — Progress Notes (Signed)
..  Spoilage occurred on 08/03/2020,Medication dispensed: Eligard, per Raye Sorrow, LPN advised pharmacy of waste/spoilage of medication due to air bubble occurred during mixing resulting in the needle being pushed off syringe wasting dose. [employer] was advised of (spoilage/waste/defective) waste and is processing replacement of medication waste. Finance team advised of replacement of waste.  Case #:[id number] Lot:  [id number] Exp: 06/2021

## 2020-09-01 ENCOUNTER — Other Ambulatory Visit: Payer: Medicare Other

## 2020-09-01 ENCOUNTER — Ambulatory Visit: Payer: Medicare Other

## 2020-09-21 ENCOUNTER — Other Ambulatory Visit: Payer: Medicare Other

## 2020-09-23 ENCOUNTER — Other Ambulatory Visit: Payer: Self-pay

## 2020-09-23 ENCOUNTER — Ambulatory Visit: Payer: Medicare Other | Admitting: Oncology

## 2020-09-23 ENCOUNTER — Inpatient Hospital Stay: Payer: Medicare Other | Attending: Oncology

## 2020-09-23 ENCOUNTER — Inpatient Hospital Stay: Payer: Medicare Other

## 2020-09-23 VITALS — BP 132/72 | HR 69 | Temp 98.1°F | Resp 20

## 2020-09-23 DIAGNOSIS — C61 Malignant neoplasm of prostate: Secondary | ICD-10-CM | POA: Insufficient documentation

## 2020-09-23 DIAGNOSIS — Z79899 Other long term (current) drug therapy: Secondary | ICD-10-CM | POA: Insufficient documentation

## 2020-09-23 DIAGNOSIS — E559 Vitamin D deficiency, unspecified: Secondary | ICD-10-CM | POA: Insufficient documentation

## 2020-09-23 DIAGNOSIS — Z86718 Personal history of other venous thrombosis and embolism: Secondary | ICD-10-CM | POA: Insufficient documentation

## 2020-09-23 DIAGNOSIS — E663 Overweight: Secondary | ICD-10-CM | POA: Insufficient documentation

## 2020-09-23 LAB — CBC WITH DIFFERENTIAL (CANCER CENTER ONLY)
Abs Immature Granulocytes: 0.03 10*3/uL (ref 0.00–0.07)
Basophils Absolute: 0 10*3/uL (ref 0.0–0.1)
Basophils Relative: 1 %
Eosinophils Absolute: 0.2 10*3/uL (ref 0.0–0.5)
Eosinophils Relative: 3 %
HCT: 37 % — ABNORMAL LOW (ref 39.0–52.0)
Hemoglobin: 12.1 g/dL — ABNORMAL LOW (ref 13.0–17.0)
Immature Granulocytes: 1 %
Lymphocytes Relative: 28 %
Lymphs Abs: 1.6 10*3/uL (ref 0.7–4.0)
MCH: 30.5 pg (ref 26.0–34.0)
MCHC: 32.7 g/dL (ref 30.0–36.0)
MCV: 93.2 fL (ref 80.0–100.0)
Monocytes Absolute: 0.5 10*3/uL (ref 0.1–1.0)
Monocytes Relative: 8 %
Neutro Abs: 3.5 10*3/uL (ref 1.7–7.7)
Neutrophils Relative %: 59 %
Platelet Count: 155 10*3/uL (ref 150–400)
RBC: 3.97 MIL/uL — ABNORMAL LOW (ref 4.22–5.81)
RDW: 13.2 % (ref 11.5–15.5)
WBC Count: 5.7 10*3/uL (ref 4.0–10.5)
nRBC: 0 % (ref 0.0–0.2)

## 2020-09-23 LAB — CMP (CANCER CENTER ONLY)
ALT: 9 U/L (ref 0–44)
AST: 16 U/L (ref 15–41)
Albumin: 3.7 g/dL (ref 3.5–5.0)
Alkaline Phosphatase: 114 U/L (ref 38–126)
Anion gap: 8 (ref 5–15)
BUN: 20 mg/dL (ref 8–23)
CO2: 27 mmol/L (ref 22–32)
Calcium: 9.3 mg/dL (ref 8.9–10.3)
Chloride: 105 mmol/L (ref 98–111)
Creatinine: 1.08 mg/dL (ref 0.61–1.24)
GFR, Estimated: >60 mL/min (ref >60)
Glucose, Bld: 101 mg/dL — ABNORMAL HIGH (ref 70–99)
Potassium: 4.3 mmol/L (ref 3.5–5.1)
Sodium: 140 mmol/L (ref 135–145)
Total Bilirubin: 0.5 mg/dL (ref 0.3–1.2)
Total Protein: 7.3 g/dL (ref 6.5–8.1)

## 2020-09-23 MED ORDER — DENOSUMAB 120 MG/1.7ML ~~LOC~~ SOLN
120.0000 mg | Freq: Once | SUBCUTANEOUS | Status: AC
  Administered 2020-09-23: 120 mg via SUBCUTANEOUS

## 2020-09-23 MED ORDER — DENOSUMAB 120 MG/1.7ML ~~LOC~~ SOLN
SUBCUTANEOUS | Status: AC
  Filled 2020-09-23: qty 1.7

## 2020-09-23 NOTE — Patient Instructions (Signed)
Denosumab injection What is this medication? DENOSUMAB (den oh sue mab) slows bone breakdown. Prolia is used to treat osteoporosis in women after menopause and in men, and in people who are taking corticosteroids for 6 months or more. [name] is used to treat a high calcium level due to cancer and to prevent bone fractures and other bone problems caused by multiple myeloma or cancer bone metastases. [name] is also used totreat giant cell tumor of the bone. This medicine may be used for other purposes; ask your health care provider orpharmacist if you have questions. COMMON BRAND NAME(S): Prolia, XGEVA What should I tell my care team before I take this medication? They need to know if you have any of these conditions: dental disease having surgery or tooth extraction infection kidney disease low levels of calcium or Vitamin D in the blood malnutrition on hemodialysis skin conditions or sensitivity thyroid or parathyroid disease an unusual reaction to denosumab, other medicines, foods, dyes, or preservatives pregnant or trying to get pregnant breast-feeding How should I use this medication? This medicine is for injection under the skin. It is given by a health careprofessional in a hospital or clinic setting. A special MedGuide will be given to you before each treatment. Be sure to readthis information carefully each time. For Prolia, talk to your pediatrician regarding the use of this medicine in children. Special care may be needed. For [name], talk to your pediatrician regarding the use of this medicine in children. While this drug may be prescribed for children as young as 13 years for selected conditions,precautions do apply. Overdosage: If you think you have taken too much of this medicine contact apoison control center or emergency room at once. NOTE: This medicine is only for you. Do not share this medicine with others. What if I miss a dose? It is important not to miss your dose. Call  your doctor or health careprofessional if you are unable to keep an appointment. What may interact with this medication? Do not take this medicine with any of the following medications: other medicines containing denosumab This medicine may also interact with the following medications: medicines that lower your chance of fighting infection steroid medicines like prednisone or cortisone This list may not describe all possible interactions. Give your health care provider a list of all the medicines, herbs, non-prescription drugs, or dietary supplements you use. Also tell them if you smoke, drink alcohol, or use illegaldrugs. Some items may interact with your medicine. What should I watch for while using this medication? Visit your doctor or health care professional for regular checks on your progress. Your doctor or health care professional may order blood tests andother tests to see how you are doing. Call your doctor or health care professional for advice if you get a fever, chills or sore throat, or other symptoms of a cold or flu. Do not treat yourself. This drug may decrease your body's ability to fight infection. Try toavoid being around people who are sick. You should make sure you get enough calcium and vitamin D while you are taking this medicine, unless your doctor tells you not to. Discuss the foods you eatand the vitamins you take with your health care professional. See your dentist regularly. Brush and floss your teeth as directed. Before youhave any dental work done, tell your dentist you are receiving this medicine. Do not become pregnant while taking this medicine or for 5 months after stopping it. Talk with your doctor or health care professional about your  birth control options while taking this medicine. Women should inform their doctor if they wish to become pregnant or think they might be pregnant. There is a potential for serious side effects to an unborn child. Talk to your health  careprofessional or pharmacist for more information. What side effects may I notice from receiving this medication? Side effects that you should report to your doctor or health care professionalas soon as possible: allergic reactions like skin rash, itching or hives, swelling of the face, lips, or tongue bone pain breathing problems dizziness jaw pain, especially after dental work redness, blistering, peeling of the skin signs and symptoms of infection like fever or chills; cough; sore throat; pain or trouble passing urine signs of low calcium like fast heartbeat, muscle cramps or muscle pain; pain, tingling, numbness in the hands or feet; seizures unusual bleeding or bruising unusually weak or tired Side effects that usually do not require medical attention (report to yourdoctor or health care professional if they continue or are bothersome): constipation diarrhea headache joint pain loss of appetite muscle pain runny nose tiredness upset stomach This list may not describe all possible side effects. Call your doctor for medical advice about side effects. You may report side effects to FDA at1-800-FDA-1088. Where should I keep my medication? This medicine is only given in a clinic, doctor's office, or other health caresetting and will not be stored at home. NOTE: This sheet is a summary. It may not cover all possible information. If you have questions about this medicine, talk to your doctor, pharmacist, orhealth care provider.  2022 Elsevier/Gold Standard (2017-06-21 16:10:44)

## 2020-09-23 NOTE — Progress Notes (Signed)
George July, LPN requested that I place lab orders for the patient in order for him to receive his injection of Xgeva. The patient's [name] was held on 08/03/20 due to low calcium levels.

## 2020-09-24 LAB — PROSTATE-SPECIFIC AG, SERUM (LABCORP): Prostate Specific Ag, Serum: 510 ng/mL — ABNORMAL HIGH (ref 0.0–4.0)

## 2020-09-30 ENCOUNTER — Other Ambulatory Visit: Payer: Medicare Other

## 2020-09-30 ENCOUNTER — Inpatient Hospital Stay: Payer: Medicare Other | Attending: Oncology

## 2020-09-30 ENCOUNTER — Other Ambulatory Visit: Payer: Self-pay

## 2020-09-30 DIAGNOSIS — C61 Malignant neoplasm of prostate: Secondary | ICD-10-CM | POA: Insufficient documentation

## 2020-09-30 DIAGNOSIS — C7951 Secondary malignant neoplasm of bone: Secondary | ICD-10-CM | POA: Insufficient documentation

## 2020-09-30 DIAGNOSIS — Z79899 Other long term (current) drug therapy: Secondary | ICD-10-CM | POA: Insufficient documentation

## 2020-09-30 DIAGNOSIS — I10 Essential (primary) hypertension: Secondary | ICD-10-CM | POA: Diagnosis not present

## 2020-09-30 DIAGNOSIS — Z5111 Encounter for antineoplastic chemotherapy: Secondary | ICD-10-CM | POA: Diagnosis not present

## 2020-09-30 DIAGNOSIS — Z192 Hormone resistant malignancy status: Secondary | ICD-10-CM | POA: Insufficient documentation

## 2020-09-30 DIAGNOSIS — Z7982 Long term (current) use of aspirin: Secondary | ICD-10-CM | POA: Insufficient documentation

## 2020-09-30 DIAGNOSIS — Z923 Personal history of irradiation: Secondary | ICD-10-CM | POA: Insufficient documentation

## 2020-09-30 LAB — CBC WITH DIFFERENTIAL (CANCER CENTER ONLY)
Abs Immature Granulocytes: 0.03 10*3/uL (ref 0.00–0.07)
Basophils Absolute: 0 10*3/uL (ref 0.0–0.1)
Basophils Relative: 1 %
Eosinophils Absolute: 0.1 10*3/uL (ref 0.0–0.5)
Eosinophils Relative: 2 %
HCT: 36.4 % — ABNORMAL LOW (ref 39.0–52.0)
Hemoglobin: 12.1 g/dL — ABNORMAL LOW (ref 13.0–17.0)
Immature Granulocytes: 1 %
Lymphocytes Relative: 23 %
Lymphs Abs: 1.4 10*3/uL (ref 0.7–4.0)
MCH: 30.9 pg (ref 26.0–34.0)
MCHC: 33.2 g/dL (ref 30.0–36.0)
MCV: 92.9 fL (ref 80.0–100.0)
Monocytes Absolute: 0.5 10*3/uL (ref 0.1–1.0)
Monocytes Relative: 8 %
Neutro Abs: 4 10*3/uL (ref 1.7–7.7)
Neutrophils Relative %: 65 %
Platelet Count: 153 10*3/uL (ref 150–400)
RBC: 3.92 MIL/uL — ABNORMAL LOW (ref 4.22–5.81)
RDW: 13.2 % (ref 11.5–15.5)
WBC Count: 6.1 10*3/uL (ref 4.0–10.5)
nRBC: 0 % (ref 0.0–0.2)

## 2020-09-30 LAB — CMP (CANCER CENTER ONLY)
ALT: 11 U/L (ref 0–44)
AST: 14 U/L — ABNORMAL LOW (ref 15–41)
Albumin: 3.6 g/dL (ref 3.5–5.0)
Alkaline Phosphatase: 111 U/L (ref 38–126)
Anion gap: 7 (ref 5–15)
BUN: 19 mg/dL (ref 8–23)
CO2: 25 mmol/L (ref 22–32)
Calcium: 8.9 mg/dL (ref 8.9–10.3)
Chloride: 105 mmol/L (ref 98–111)
Creatinine: 1.01 mg/dL (ref 0.61–1.24)
GFR, Estimated: >60 mL/min (ref >60)
Glucose, Bld: 105 mg/dL — ABNORMAL HIGH (ref 70–99)
Potassium: 4.6 mmol/L (ref 3.5–5.1)
Sodium: 137 mmol/L (ref 135–145)
Total Bilirubin: 0.5 mg/dL (ref 0.3–1.2)
Total Protein: 6.9 g/dL (ref 6.5–8.1)

## 2020-10-01 LAB — PROSTATE-SPECIFIC AG, SERUM (LABCORP): Prostate Specific Ag, Serum: 496 ng/mL — ABNORMAL HIGH (ref 0.0–4.0)

## 2020-10-04 ENCOUNTER — Other Ambulatory Visit: Payer: Self-pay

## 2020-10-04 ENCOUNTER — Inpatient Hospital Stay: Payer: Medicare Other

## 2020-10-04 ENCOUNTER — Inpatient Hospital Stay: Payer: Medicare Other | Admitting: Oncology

## 2020-10-04 VITALS — BP 123/68 | HR 77 | Temp 97.9°F | Resp 18 | Ht 75.0 in | Wt 231.4 lb

## 2020-10-04 DIAGNOSIS — C61 Malignant neoplasm of prostate: Secondary | ICD-10-CM

## 2020-10-04 MED ORDER — DENOSUMAB 120 MG/1.7ML ~~LOC~~ SOLN
SUBCUTANEOUS | Status: AC
  Filled 2020-10-04: qty 1.7

## 2020-10-04 NOTE — Progress Notes (Signed)
Hematology and Oncology Follow Up Visit  George COWSERT [id number] February 23, 1946 75 y.o. 10/04/2020 2:41 PM   Principle Diagnosis: 75 year old man with advanced prostate cancer with disease to the bone diagnosed in 2013.  He has castration-resistant with Gleason score 4 + 3 = 7 and PSA 6.45 noted with localized disease in 2003.  Prior Therapy:  1. He is status post a prostatectomy and lymph node dissection done on November 05, 2001.   2. S/P 6480 cGy of radiation therapy in 36 fractions between February and April 2005.   3. He developed advanced disease and was treated with Lupron in 2011.  His PSA nadir down to 4.11. PSA was up to 5.91 in December of 2012 with a testosterone level of 35. His staging workup did not reveal any bony metastasis.  4. Casodex was added to Lupron on 05/2011. This was discontinued in January 2016 due to progression of disease.  5. Zytiga 1000 mg daily started on 05/13/2014.  Therapy discontinued in December 2018 because of progression of disease. Bone scan of the time showed limited metastatic disease.  6.  He is status post radiation to pelvic lymph nodes between October 5 and December 11, 2018.  He received total of 40 Gray in 5 fractions.  7. Xtandi 160 mg daily started in December 2018.  Therapy discontinued in September 2021.  8. Xofigo monthly infusion started in October 2021.  He completed 6 months of therapy.  Current therapy:     Eligard 30 mg every 4 months.  Next Eligard will be given in October 2022.  Xgeva 120 mg given every 4 weeks started in October 2021.  Last injection given on 09/23/2020.    Interim History: [name] is here for a follow-up evaluation.  Since her last visit, he reports feeling reasonably well without any major complaints.  He denies any bone pain or pathological fractures.  He denies any shortness of breath or difficulty breathing.  He denies any hospitalizations or illnesses.  His performance status and quality of life  remained excellent.          Medications: Reviewed without changes. Current Outpatient Medications  Medication Sig Dispense Refill   ALPRAZolam (XANAX) 0.5 MG tablet Take 0.5 mg by mouth 3 (three) times daily as needed for sleep. (Patient not taking: Reported on 11/26/2019)     amLODipine (NORVASC) 5 MG tablet amlodipine 5 mg tablet  Take 1 tablet every day by oral route.     Ascorbic Acid (VITAMIN C) 100 MG CHEW Vitamin C  1 once a day     aspirin EC 81 MG tablet Take 81 mg by mouth daily.     Bergamot Oil OIL Bergamot  Take 1 or 2 capsules daily (Patient not taking: Reported on 11/26/2019)     calcium-vitamin D (OSCAL 500/200 D-3) 500-200 MG-UNIT tablet Take 1 tablet by mouth daily with breakfast. 1 tablet    Cholecalciferol (VITAMIN D3) 3000 units TABS cholecalciferol (vitamin D3) 1,000 unit tablet  1 tablet orally once a day     Leuprolide Acetate (LUPRON DEPOT IM) Inject into the muscle every 4 (four) months.     Multiple Vitamins-Minerals (MULTIVITAMIN ADULT PO) multivitamin  1 tablet orally once a day     NON FORMULARY CBD oil 500 mg  Take 1 dropperful under tongue in the evening or at bedtime (Patient not taking: Reported on 11/26/2019)     Omega-3 Fatty Acids (FISH OIL) 1000 MG CAPS Fish Oil  2 tablet po daily  XTANDI 40 MG tablet TAKE 4 TABLETS (160 MG TOTAL) BY MOUTH DAILY. (Patient not taking: Reported on 11/26/2019) 120 tablet 0   No current facility-administered medications for this visit.    Allergies:  Allergies  Allergen Reactions   Penicillins Rash and Other (See Comments)    Has patient had a PCN reaction causing immediate rash, facial/tongue/throat swelling, SOB or lightheadedness with hypotension: YES Has patient had a PCN reaction causing severe rash involving mucus membranes or skin necrosis: YES Has patient had a PCN reaction that required hospitalization: NO Has patient had a PCN reaction occurring within the last 10 years: NO       Physical  Exam:       Blood pressure 123/68, pulse 77, temperature 97.9 F (36.6 C), temperature source Oral, resp. rate 18, height '6\' 3"'$  (1.905 m), weight 231 lb 6.4 oz (105 kg), SpO2 100 %.     ECOG: 1     General appearance: Comfortable appearing without any discomfort Head: Normocephalic without any trauma Oropharynx: Mucous membranes are moist and pink without any thrush or ulcers. Eyes: Pupils are equal and round reactive to light. Lymph nodes: No cervical, supraclavicular, inguinal or axillary lymphadenopathy.   Heart:regular rate and rhythm.  S1 and S2 without leg edema. Lung: Clear without any rhonchi or wheezes.  No dullness to percussion. Abdomin: Soft, nontender, nondistended with good bowel sounds.  No hepatosplenomegaly. Musculoskeletal: No joint deformity or effusion.  Full range of motion noted. Neurological: No deficits noted on motor, sensory and deep tendon reflex exam. Skin: No petechial rash or dryness.  Appeared moist.                Lab Results: Lab Results  Component Value Date   WBC 6.1 09/30/2020   HGB 12.1 (L) 09/30/2020   HCT 36.4 (L) 09/30/2020   MCV 92.9 09/30/2020   PLT 153 09/30/2020     Chemistry      Component Value Date/Time   NA 137 09/30/2020 0905   NA 141 02/01/2017 0749   K 4.6 09/30/2020 0905   K 3.5 02/01/2017 0749   CL 105 09/30/2020 0905   CL 104 06/11/2012 0817   CO2 25 09/30/2020 0905   CO2 26 02/01/2017 0749   BUN 19 09/30/2020 0905   BUN 14.5 02/01/2017 0749   CREATININE 1.01 09/30/2020 0905   CREATININE 0.9 02/01/2017 0749      Component Value Date/Time   CALCIUM 8.9 09/30/2020 0905   CALCIUM 9.2 02/01/2017 0749   ALKPHOS 111 09/30/2020 0905   ALKPHOS 70 02/01/2017 0749   AST 14 (L) 09/30/2020 0905   AST 17 02/01/2017 0749   ALT 11 09/30/2020 0905   ALT 14 02/01/2017 0749   BILITOT 0.5 09/30/2020 0905   BILITOT 0.95 02/01/2017 0749      Results for Nichols, George KIRKEBY (MRN [id number]) as of 10/04/2020  13:36  Ref. Range 09/23/2020 15:27 09/30/2020 09:05  Prostate Specific Ag, Serum Latest Ref Range: 0.0 - 4.0 ng/mL 510.0 (H) 496.0 (H)    Impression and Plan:  75 year old man with:  1.  Advanced prostate cancer with disease to the bone and is currently castration-resistant.   Laboratory data obtained on September 30, 2020 were personally reviewed and treatment choices were discussed.  His PSA continues to rise despite previous treatment outlined above.  Treatment options including systemic chemotherapy using Taxotere were reviewed.  Updating his staging scans before proceeding with treatment would be recommended.  He is agreeable to proceed with  a PET scan at this time pending these results I reviewed treatment options.  At this time Taxotere chemotherapy remains his best option as he is exhausted all other options.  After receiving chemotherapy, lutetium could be an alternative option.  We discussed in details the rationale and the side effects associated with Taxotere chemotherapy.  These complications that include nausea, vomiting, pression, neutropenia and sepsis hair loss among many other implications on his quality of life, prognosis and quality of life.  He will consider these options at this time and will discuss his decision after repeat imaging studies.    2. Androgen deprivation.  Next Eligard will be in 4 months.  Complication clinic weight gain and hot flashes were discussed.   3.  Bone directed therapy: He will receive Xgeva in the next 4 to 8 weeks.  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  4. Hypertension: No issues reported since discontinuation of Xtandi.  5.  Prognosis and goals of care: Therapy remains palliative and aggressive measures are warranted given his reasonable performance status.  6. Follow up: We will be in the next few weeks after obtaining PET imaging.  30 minutes were dedicated to this visit.  Time spent on  reviewing laboratory data, disease status update, treatment choices and future plan of care discussion.  Zola Button, MD 8/9/20222:41 PM

## 2020-10-14 ENCOUNTER — Telehealth: Payer: Self-pay | Admitting: Oncology

## 2020-10-14 NOTE — Telephone Encounter (Signed)
Scheduled per 08/09 los, patient is notified of upcoming phone visit. Gave patient central radiology number to schedule PET scan.

## 2020-10-18 ENCOUNTER — Inpatient Hospital Stay: Payer: Medicare Other

## 2020-10-24 ENCOUNTER — Inpatient Hospital Stay: Payer: Medicare Other | Admitting: Oncology

## 2020-10-27 ENCOUNTER — Ambulatory Visit (HOSPITAL_COMMUNITY)
Admission: RE | Admit: 2020-10-27 | Discharge: 2020-10-27 | Disposition: A | Discharge status: Home / Self Care | Payer: Medicare Other | Source: Ambulatory Visit | Attending: Oncology | Admitting: Oncology

## 2020-10-27 ENCOUNTER — Inpatient Hospital Stay: Payer: Medicare Other | Attending: Oncology

## 2020-10-27 ENCOUNTER — Other Ambulatory Visit: Payer: Self-pay

## 2020-10-27 DIAGNOSIS — C61 Malignant neoplasm of prostate: Secondary | ICD-10-CM | POA: Diagnosis not present

## 2020-10-27 LAB — CMP (CANCER CENTER ONLY)
ALT: 11 U/L (ref 0–44)
AST: 14 U/L — ABNORMAL LOW (ref 15–41)
Albumin: 3.6 g/dL (ref 3.5–5.0)
Alkaline Phosphatase: 138 U/L — ABNORMAL HIGH (ref 38–126)
Anion gap: 9 (ref 5–15)
BUN: 19 mg/dL (ref 8–23)
CO2: 24 mmol/L (ref 22–32)
Calcium: 8.8 mg/dL — ABNORMAL LOW (ref 8.9–10.3)
Chloride: 106 mmol/L (ref 98–111)
Creatinine: 1.01 mg/dL (ref 0.61–1.24)
GFR, Estimated: >60 mL/min (ref >60)
Glucose, Bld: 105 mg/dL — ABNORMAL HIGH (ref 70–99)
Potassium: 4.6 mmol/L (ref 3.5–5.1)
Sodium: 139 mmol/L (ref 135–145)
Total Bilirubin: 0.5 mg/dL (ref 0.3–1.2)
Total Protein: 7 g/dL (ref 6.5–8.1)

## 2020-10-27 LAB — CBC WITH DIFFERENTIAL (CANCER CENTER ONLY)
Abs Immature Granulocytes: 0.02 10*3/uL (ref 0.00–0.07)
Basophils Absolute: 0 10*3/uL (ref 0.0–0.1)
Basophils Relative: 1 %
Eosinophils Absolute: 0.1 10*3/uL (ref 0.0–0.5)
Eosinophils Relative: 2 %
HCT: 36.7 % — ABNORMAL LOW (ref 39.0–52.0)
Hemoglobin: 12.3 g/dL — ABNORMAL LOW (ref 13.0–17.0)
Immature Granulocytes: 0 %
Lymphocytes Relative: 25 %
Lymphs Abs: 1.5 10*3/uL (ref 0.7–4.0)
MCH: 30.8 pg (ref 26.0–34.0)
MCHC: 33.5 g/dL (ref 30.0–36.0)
MCV: 91.8 fL (ref 80.0–100.0)
Monocytes Absolute: 0.4 10*3/uL (ref 0.1–1.0)
Monocytes Relative: 7 %
Neutro Abs: 3.8 10*3/uL (ref 1.7–7.7)
Neutrophils Relative %: 65 %
Platelet Count: 156 10*3/uL (ref 150–400)
RBC: 4 MIL/uL — ABNORMAL LOW (ref 4.22–5.81)
RDW: 13.3 % (ref 11.5–15.5)
WBC Count: 5.9 10*3/uL (ref 4.0–10.5)
nRBC: 0 % (ref 0.0–0.2)

## 2020-10-27 MED ORDER — PIFLIFOLASTAT F 18 (PYLARIFY) INJECTION
9.0000 | Freq: Once | INTRAVENOUS | Status: AC
  Administered 2020-10-27: 9.7 via INTRAVENOUS

## 2020-10-28 LAB — PROSTATE-SPECIFIC AG, SERUM (LABCORP): Prostate Specific Ag, Serum: 745 ng/mL — ABNORMAL HIGH (ref 0.0–4.0)

## 2020-11-01 ENCOUNTER — Inpatient Hospital Stay (HOSPITAL_BASED_OUTPATIENT_CLINIC_OR_DEPARTMENT_OTHER): Payer: Medicare Other | Admitting: Oncology

## 2020-11-01 ENCOUNTER — Other Ambulatory Visit (HOSPITAL_COMMUNITY): Payer: Self-pay

## 2020-11-01 DIAGNOSIS — C61 Malignant neoplasm of prostate: Secondary | ICD-10-CM | POA: Diagnosis not present

## 2020-11-01 MED ORDER — LIDOCAINE-PRILOCAINE 2.5-2.5 % EX CREA
1.0000 "application " | TOPICAL_CREAM | CUTANEOUS | 0 refills | Status: DC | PRN
  Filled 2020-11-01: qty 30, 30d supply, fill #0

## 2020-11-01 MED ORDER — PROCHLORPERAZINE MALEATE 10 MG PO TABS
10.0000 mg | ORAL_TABLET | Freq: Four times a day (QID) | ORAL | 0 refills | Status: DC | PRN
  Filled 2020-11-01: qty 30, 8d supply, fill #0

## 2020-11-01 NOTE — Progress Notes (Signed)
Hematology and Oncology Follow Up for Telemedicine Visits  [name] [id number] [birth year] 75 y.o. 11/01/2020 10:29 AM [name], MDShelton, George Millin, MD   I connected with [name] on 11/01/20 at 11:00 AM EDT by telephone visit and verified that I am speaking with the correct person using two identifiers.   I discussed the limitations, risks, security and privacy concerns of performing an evaluation and management service by telemedicine and the availability of in-person appointments. I also discussed with the patient that there may be a patient responsible charge related to this service. The patient expressed understanding and agreed to proceed.  Other persons participating in the visit and their role in the encounter:    Patient's location: Home Provider's location: Office    Principle Diagnosis: 75 year old man with castration-resistant advanced prostate cancer with disease to the bone diagnosed in 2013.  He presented with Gleason score 4 + 3 = 7 and PSA 6.45.   Prior Therapy:  1. He is status post a prostatectomy and lymph node dissection done on November 05, 2001.    2. S/P 6480 cGy of radiation therapy in 36 fractions between February and April 2005.    3. He developed advanced disease and was treated with Lupron in 2011.  His PSA nadir down to 4.11. PSA was up to 5.91 in December of 2012 with a testosterone level of 35. His staging workup did not reveal any bony metastasis.   4. Casodex was added to Lupron on 05/2011. This was discontinued in January 2016 due to progression of disease.   5. Zytiga 1000 mg daily started on 05/13/2014.  Therapy discontinued in December 2018 because of progression of disease. Bone scan of the time showed limited metastatic disease.   6.  He is status post radiation to pelvic lymph nodes between October 5 and December 11, 2018.  He received total of 40 Gray in 5 fractions.   7. Xtandi 160 mg daily started in December 2018.   Therapy discontinued in September 2021.   8. Xofigo monthly infusion started in October 2021.  He completed 6 months of therapy.   Current therapy:        Eligard 30 mg every 4 months.  Next Eligard will be given in October 2022.   Xgeva 120 mg given every 4 weeks started in October 2021.   He will receive injection with the next visit in October 2022.     Interim History: [name] reports feeling reasonably well without any major complaints.  He denies any nausea, vomiting or worsening neuropathy.  He does report occasional tremors but has not progressed at this time.     Medications: I have reviewed the patient's current medications.  Current Outpatient Medications  Medication Sig Dispense Refill   ALPRAZolam (XANAX) 0.5 MG tablet Take 0.5 mg by mouth 3 (three) times daily as needed for sleep. (Patient not taking: No sig reported)     amLODipine (NORVASC) 5 MG tablet amlodipine 5 mg tablet  Take 1 tablet every day by oral route.     Ascorbic Acid (VITAMIN C) 100 MG CHEW Vitamin C  1 once a day     aspirin EC 81 MG tablet Take 81 mg by mouth daily.     Bergamot Oil OIL Bergamot  Take 1 or 2 capsules daily     calcium-vitamin D (OSCAL 500/200 D-3) 500-200 MG-UNIT tablet Take 1 tablet by mouth daily with breakfast. 1 tablet    Cholecalciferol (VITAMIN D3) 3000 units TABS  cholecalciferol (vitamin D3) 1,000 unit tablet  1 tablet orally once a day     Leuprolide Acetate (LUPRON DEPOT IM) Inject into the muscle every 4 (four) months.     Multiple Vitamins-Minerals (MULTIVITAMIN ADULT PO) multivitamin  1 tablet orally once a day     NON FORMULARY CBD oil 500 mg  Take 1 dropperful under tongue in the evening or at bedtime     Omega-3 Fatty Acids (FISH OIL) 1000 MG CAPS Fish Oil  2 tablet po daily     XTANDI 40 MG tablet TAKE 4 TABLETS (160 MG TOTAL) BY MOUTH DAILY. 120 tablet 0   No current facility-administered medications for this visit.     Allergies:  Allergies   Allergen Reactions   Penicillins Rash and Other (See Comments)    Has patient had a PCN reaction causing immediate rash, facial/tongue/throat swelling, SOB or lightheadedness with hypotension: YES Has patient had a PCN reaction causing severe rash involving mucus membranes or skin necrosis: YES Has patient had a PCN reaction that required hospitalization: NO Has patient had a PCN reaction occurring within the last 10 years: NO        Lab Results: Lab Results  Component Value Date   WBC 5.9 10/27/2020   HGB 12.3 (L) 10/27/2020   HCT 36.7 (L) 10/27/2020   MCV 91.8 10/27/2020   PLT 156 10/27/2020     Chemistry      Component Value Date/Time   NA 139 10/27/2020 1408   NA 141 02/01/2017 0749   K 4.6 10/27/2020 1408   K 3.5 02/01/2017 0749   CL 106 10/27/2020 1408   CL 104 06/11/2012 0817   CO2 24 10/27/2020 1408   CO2 26 02/01/2017 0749   BUN 19 10/27/2020 1408   BUN 14.5 02/01/2017 0749   CREATININE 1.01 10/27/2020 1408   CREATININE 0.9 02/01/2017 0749      Component Value Date/Time   CALCIUM 8.8 (L) 10/27/2020 1408   CALCIUM 9.2 02/01/2017 0749   ALKPHOS 138 (H) 10/27/2020 1408   ALKPHOS 70 02/01/2017 0749   AST 14 (L) 10/27/2020 1408   AST 17 02/01/2017 0749   ALT 11 10/27/2020 1408   ALT 14 02/01/2017 0749   BILITOT 0.5 10/27/2020 1408   BILITOT 0.95 02/01/2017 0749       IMPRESSION: Marked interval worsening of disease related to prostate cancer with signs of nodal and widespread bony metastatic disease as described.   LEFT adrenal activity compatible with LEFT adrenal metastases.   Significant central canal narrowing in the cervical spine (image 61/4) at C6-C7, correlate with any symptoms. This appears more likely related to degenerative change but is not well characterized on the current study. Dedicated cervical spine imaging may be helpful for further assessment as warranted.   Nonspecific ground-glass in the RIGHT chest adjacent to area of RIGHT  hilar uptake may represent developing parenchymal disease or inflammation, attention on follow-up.   Signs of prostatectomy and pelvic lymphadenectomy.   These results will be called to the ordering clinician or representative by the Radiologist Assistant, and communication documented in the PACS or [employer].    Impression and Plan:  75 year old man with:   1.  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   His disease status was updated at this time including review of his laboratory data as well as PET imaging that was obtained on September 1.  Treatment options were also reviewed at this time.  Taxotere chemotherapy remains  his best option moving forward.  Complications that include nausea, vomiting, myelosuppression, neuropathy and fluid retention were reiterated.  After discussion today he is agreeable to proceed with chemotherapy education class.  He prefers to start first week of October due to prior engagements.       2. Androgen deprivation.  Next Eligard will be in October 2022.     3.  Bone directed therapy: This will be repeated in October 2022.  4.  IV access: Risks and benefits of a Port-A-Cath insertion was discussed.  Complications that include bleeding and thrombosis as well as infection were reiterated.  He is agreeable to proceed.      5.  Prognosis and goals of care: His disease is incurable and any intervention is palliative.   6. Follow up: First week of October to start chemotherapy.   I provided 20 minutes of non face-to-face telephone visit time during this encounter.  The time was dedicated to reviewing imaging studies, laboratory data, discussing treatment choices and future plan of care reviewed.  Zola Button, MD 11/01/2020 10:29 AM

## 2020-11-01 NOTE — Progress Notes (Signed)
START ON PATHWAY REGIMEN - Prostate     A cycle is every 21 days:     Prednisone      Docetaxel   **Always confirm dose/schedule in your pharmacy ordering system**  Patient Characteristics: Adenocarcinoma, Recurrent/New Systemic Disease, Castration Resistant, M1, Prior Novel Hormonal Agent, No Molecular Alteration or Targeted Therapy Exhausted, No Prior Docetaxel Histology: Adenocarcinoma Therapeutic Status: Recurrent/New Systemic Disease  Intent of Therapy: Non-Curative / Palliative Intent, Discussed with Patient

## 2020-11-02 ENCOUNTER — Encounter: Payer: Self-pay | Admitting: Oncology

## 2020-11-02 ENCOUNTER — Other Ambulatory Visit (HOSPITAL_COMMUNITY): Payer: Self-pay

## 2020-11-02 ENCOUNTER — Telehealth: Payer: Self-pay | Admitting: Oncology

## 2020-11-02 NOTE — Telephone Encounter (Signed)
Scheduled follow-up appointments per 9/6 los. Patient is aware. 

## 2020-11-07 ENCOUNTER — Other Ambulatory Visit: Payer: Self-pay

## 2020-11-07 ENCOUNTER — Inpatient Hospital Stay: Payer: Medicare Other

## 2020-11-18 ENCOUNTER — Other Ambulatory Visit: Payer: Self-pay | Admitting: Radiology

## 2020-11-21 ENCOUNTER — Encounter (HOSPITAL_COMMUNITY): Payer: Self-pay

## 2020-11-21 ENCOUNTER — Other Ambulatory Visit: Payer: Self-pay | Admitting: Oncology

## 2020-11-21 ENCOUNTER — Ambulatory Visit (HOSPITAL_COMMUNITY)
Admission: RE | Admit: 2020-11-21 | Discharge: 2020-11-21 | Disposition: A | Discharge status: Home / Self Care | Payer: Medicare Other | Source: Ambulatory Visit | Attending: Oncology | Admitting: Oncology

## 2020-11-21 ENCOUNTER — Other Ambulatory Visit: Payer: Self-pay

## 2020-11-21 DIAGNOSIS — C61 Malignant neoplasm of prostate: Secondary | ICD-10-CM

## 2020-11-21 DIAGNOSIS — Z7982 Long term (current) use of aspirin: Secondary | ICD-10-CM | POA: Diagnosis not present

## 2020-11-21 DIAGNOSIS — I10 Essential (primary) hypertension: Secondary | ICD-10-CM | POA: Diagnosis not present

## 2020-11-21 DIAGNOSIS — Z880 Allergy status to penicillin: Secondary | ICD-10-CM | POA: Diagnosis not present

## 2020-11-21 DIAGNOSIS — Z79899 Other long term (current) drug therapy: Secondary | ICD-10-CM | POA: Insufficient documentation

## 2020-11-21 HISTORY — PX: IR IMAGING GUIDED PORT INSERTION: IMG5740

## 2020-11-21 MED ORDER — LIDOCAINE-EPINEPHRINE (PF) 2 %-1:200000 IJ SOLN
INTRAMUSCULAR | Status: AC
  Filled 2020-11-21: qty 20

## 2020-11-21 MED ORDER — HEPARIN SOD (PORK) LOCK FLUSH 100 UNIT/ML IV SOLN
INTRAVENOUS | Status: DC | PRN
  Administered 2020-11-21 (×2): 500 [IU] via INTRAVENOUS

## 2020-11-21 MED ORDER — MIDAZOLAM HCL 2 MG/2ML IJ SOLN
INTRAMUSCULAR | Status: AC
  Filled 2020-11-21: qty 4

## 2020-11-21 MED ORDER — FENTANYL CITRATE (PF) 100 MCG/2ML IJ SOLN
INTRAMUSCULAR | Status: DC | PRN
  Administered 2020-11-21 (×2): 50 ug via INTRAVENOUS

## 2020-11-21 MED ORDER — FENTANYL CITRATE (PF) 100 MCG/2ML IJ SOLN
INTRAMUSCULAR | Status: AC
  Filled 2020-11-21: qty 2

## 2020-11-21 MED ORDER — LIDOCAINE-EPINEPHRINE (PF) 1 %-1:200000 IJ SOLN
INTRAMUSCULAR | Status: DC | PRN
  Administered 2020-11-21: 10 mL

## 2020-11-21 MED ORDER — MIDAZOLAM HCL 2 MG/2ML IJ SOLN
INTRAMUSCULAR | Status: DC | PRN
  Administered 2020-11-21 (×3): 1 mg via INTRAVENOUS

## 2020-11-21 MED ORDER — HEPARIN SOD (PORK) LOCK FLUSH 100 UNIT/ML IV SOLN
INTRAVENOUS | Status: AC
  Filled 2020-11-21: qty 5

## 2020-11-21 MED ORDER — SODIUM CHLORIDE 0.9 % IV SOLN: INTRAVENOUS | Status: DC

## 2020-11-21 NOTE — Consult Note (Signed)
Chief Complaint: Patient was seen in consultation today for Port-A-Cath placement  Referring Physician(s): [name]  Supervising Physician: Mir, Sharen Heck  Patient Status: [facility]  History of Present Illness: [name] is a 75 y.o. male with past medical history of hypertension and prostate carcinoma (initial diagnosis 2003) with prior prostatectomy and lymphadenectomy ,radiation therapy and hormonal treatment.  He now has evidence of disease progression on imaging and poor venous access.  He presents today for Port-A-Cath placement for palliative chemotherapy.  Past Medical History:  Diagnosis Date   Benign essential hypertension 03/04/2017   Hx of radiation therapy    Prostate cancer [facility])     Past Surgical History:  Procedure Laterality Date   BURR HOLE Right 09/14/2015   Procedure: Right craniotomy for subdural ;  Surgeon: Kary Kos, MD;  Location: [facility];  Service: Neurosurgery;  Laterality: Right;   PROSTATE BIOPSY     PROSTATECTOMY      Allergies: Penicillins  Medications: Prior to Admission medications   Medication Sig Start Date End Date Taking? Authorizing Provider  amLODipine (NORVASC) 5 MG tablet amlodipine 5 mg tablet  Take 1 tablet every day by oral route.   Yes [provider]  Ascorbic Acid (VITAMIN C) 100 MG CHEW Vitamin C  1 once a day   Yes [provider]  aspirin EC 81 MG tablet Take 81 mg by mouth daily.   Yes [provider]  calcium-vitamin D (OSCAL 500/200 D-3) 500-200 MG-UNIT tablet Take 1 tablet by mouth daily with breakfast. 03/19/17  Yes Shadad, Mathis Dad, MD  Cholecalciferol (VITAMIN D3) 3000 units TABS cholecalciferol (vitamin D3) 1,000 unit tablet  1 tablet orally once a day   Yes [provider]  Multiple Vitamins-Minerals (MULTIVITAMIN ADULT PO) multivitamin  1 tablet orally once a day   Yes [provider]  Omega-3 Fatty Acids (FISH OIL) 1000 MG CAPS Fish Oil  2 tablet po  daily   Yes [provider]  ALPRAZolam (XANAX) 0.5 MG tablet Take 0.5 mg by mouth 3 (three) times daily as needed for sleep. Patient not taking: No sig reported    [provider]  Bergamot Oil OIL Bergamot  Take 1 or 2 capsules daily    [provider]  Leuprolide Acetate (LUPRON DEPOT IM) Inject into the muscle every 4 (four) months.    [provider]  lidocaine-prilocaine (EMLA) cream Apply 1 application topically as needed. 11/01/20   [name], MD  NON FORMULARY CBD oil 500 mg  Take 1 dropperful under tongue in the evening or at bedtime    [provider]  prochlorperazine (COMPAZINE) 10 MG tablet Take 1 tablet (10 mg total) by mouth every 6 (six) hours as needed for nausea or vomiting. 11/01/20   [name], MD  XTANDI 40 MG tablet TAKE 4 TABLETS (160 MG TOTAL) BY MOUTH DAILY. 09/16/19   [name], MD     Family History  Problem Relation Age of Onset   Skin cancer Father    Thyroid cancer Brother    Throat cancer Brother    Prostate cancer Neg Hx     Social History   Socioeconomic History   Marital status: Married    Spouse name: Not on file   Number of children: 1   Years of education: Not on file   Highest education level: Not on file  Occupational History   Not on file  Tobacco Use   Smoking  status: Never   Smokeless tobacco: Never  Vaping Use   Vaping Use: Never used  Substance and Sexual Activity   Alcohol use: Yes    Comment: socially   Drug use: No   Sexual activity: Not Currently  Other Topics Concern   Not on file  Social History Narrative   Has one son.   Social Determinants of Health   Financial Resource Strain: Not on file  Food Insecurity: Not on file  Transportation Needs: Not on file  Physical Activity: Not on file  Stress: Not on file  Social Connections: Not on file      Review of Systems currently denies fever, headache, chest pain, dyspnea, cough, abdominal/back pain, nausea,  vomiting or bleeding.  Vital Signs: BP 125/80   Pulse 80   Temp 98.2 F (36.8 C) (Oral)   Resp 18   Ht 6\' 3"  (1.905 m)   Wt 230 lb (104.3 kg)   SpO2 99%   BMI 28.75 kg/m   Physical Exam awake, alert.  Chest with distant but clear breath sounds bilaterally.  Heart with regular rate and rhythm.  Abdomen soft, positive bowel sounds, nontender.  Bilateral pretibial edema noted.  Imaging: NM PET (PSMA) SKULL TO MID THIGH  Result Date: 10/30/2020 CLINICAL DATA:  Post prostatectomy in a 75 year old male found have biochemical recurrence with PSA of 496. EXAM: NUCLEAR MEDICINE PET SKULL BASE TO THIGH TECHNIQUE: 9.7 mCi F18 Piflufolastat (Pylarify) was injected intravenously. Full-ring PET imaging was performed from the skull base to thigh after the radiotracer. CT data was obtained and used for attenuation correction and anatomic localization. COMPARISON:  November 10, 2019. FINDINGS: NECK LEFT thoracic inlet nodal enlargement with a group of lymph nodes measuring 3.1 cm (image 75/4) maximum SUV of 79.22. Incidental CT finding: None CHEST LEFT thoracic inlet lymph nodes as described. Smaller LEFT supraclavicular lymph nodes, for instance on image (66/4) a tiny LEFT supraclavicular lymph node with a maximum SUV of 4.69 (image 66/4) this measures 2-3 mm. Numerous lymph nodes throughout the chest in the RIGHT hilum, adjacent to the aorta and esophagus, subcarinal and LEFT mediastinal along the AP window and juxta hilar region in the LEFT chest. LEFT juxta hilar lymph node (image 93/4) 14 mm with a maximum SUV of 61.88 Subcarinal lymph node measuring 11 mm (image 93/4) maximum SUV of 34.19 Similar radiotracer accumulation about the RIGHT hilum. Small juxta aortic lymph node (image 99/4) approximately 1 cm size with a maximum SUV of 53.26. Similar radiotracer accumulation seen in retrocrural lymph nodes also quite small. Small amounts of soft tissue along the paraspinous region on image 114 of series 4 also  with intense metabolic activity greatest measurable area in this location approximately 8 mm. Moderate parenchymal uptake along the major fissure peripheral to RIGHT hilar uptake (image 99/4) maximum SUV shown here of 9.05 Small RIGHT upper lobe pulmonary nodule (image 88/4) no substantial radiotracer accumulation. Signs of prior granulomatous disease also in the RIGHT upper lobe. Incidental CT finding: None ABDOMEN/PELVIS Prostate: No focal activity in the prostate bed. Lymph nodes: Retrocrural lymph nodes as described above on the RIGHT in the retrocrural region. No additional signs of nodal disease aside from small area of uptake in the RIGHT pelvis (image 173/4) anterior to iliac vasculature with a maximum SUV of 6.9 only very tiny 2-3 mm lymph node may be present in this area Liver: No evidence of liver metastasis LEFT adrenal radiotracer accumulation (image 136/4) maximum SUV of 8.24 Incidental CT finding: No  acute findings relative to liver, gallbladder, pancreas, spleen, adrenal glands and kidneys. Large RIGHT renal cyst measures approximately 5.9 cm. Signs of prostatectomy and pelvic lymphadenectomy. No acute gastrointestinal process. SKELETON Diffuse skeletal metastases involving the calvarium, the entire spine, bilateral scapulae, bilateral pelvis and bilateral proximal femora. Petra Kuba of involvement is much worse than on the study from November 10, 2019. For instance, in the upper thoracic spine at the T2 level there is confluent sclerosis on the current study with a maximum SUV of 69.57. Note that radiotracer accumulation is difficult to separate at many levels in the spine from the adjacent level such is the severity of the disease seen throughout the spine. Radiotracer accumulation with maximum SUV of approximately 30.87 in the sternal manubrium. Radiotracer accumulation with a maximum SUV of 32.78 at the skull base in the clivus. Scattered calvarial metastases. Extremity involvement with example of  disease in the RIGHT proximal femur showing a maximum SUV of 16.2 (image 230/4) Incidental note is made of prior high RIGHT frontal craniotomy. Signs of central canal narrowing, substantial narrowing of the central cervical spinal canal at the C6-7 level is suggested with facet arthropathy and discogenic changes accounting for these findings more likely, not well characterized on the current evaluation. IMPRESSION: Marked interval worsening of disease related to prostate cancer with signs of nodal and widespread bony metastatic disease as described. LEFT adrenal activity compatible with LEFT adrenal metastases. Significant central canal narrowing in the cervical spine (image 61/4) at C6-C7, correlate with any symptoms. This appears more likely related to degenerative change but is not well characterized on the current study. Dedicated cervical spine imaging may be helpful for further assessment as warranted. Nonspecific ground-glass in the RIGHT chest adjacent to area of RIGHT hilar uptake may represent developing parenchymal disease or inflammation, attention on follow-up. Signs of prostatectomy and pelvic lymphadenectomy. These results will be called to the ordering clinician or representative by the Radiologist Assistant, and communication documented in the PACS or [employer]. Electronically Signed   By: Zetta Bills M.D.   On: 10/30/2020 08:53    Labs:  CBC: Recent Labs    08/03/20 1422 09/23/20 1527 09/30/20 0905 10/27/20 1408  WBC 5.9 5.7 6.1 5.9  HGB 11.7* 12.1* 12.1* 12.3*  HCT 34.2* 37.0* 36.4* 36.7*  PLT 146* 155 153 156    COAGS: No results for input(s): INR, APTT in the last 8760 hours.  BMP: Recent Labs    08/03/20 1422 09/23/20 1527 09/30/20 0905 10/27/20 1408  NA 139 140 137 139  K 4.3 4.3 4.6 4.6  CL 107 105 105 106  CO2 25 27 25 24   GLUCOSE 103* 101* 105* 105*  BUN 23 20 19 19   CALCIUM 8.7* 9.3 8.9 8.8*  CREATININE 1.31* 1.08 1.01 1.01  GFRNONAA 57* >60 >60  >60    LIVER FUNCTION TESTS: Recent Labs    08/03/20 1422 09/23/20 1527 09/30/20 0905 10/27/20 1408  BILITOT 0.4 0.5 0.5 0.5  AST 14* 16 14* 14*  ALT 9 9 11 11   ALKPHOS 70 114 111 138*  PROT 6.8 7.3 6.9 7.0  ALBUMIN 3.3* 3.7 3.6 3.6    TUMOR MARKERS: No results for input(s): AFPTM, CEA, CA199, CHROMGRNA in the last 8760 hours.  Assessment and Plan: 75 y.o. male with past medical history of hypertension and prostate carcinoma (initial diagnosis 2003) with prior prostatectomy and lymphadenectomy ,radiation therapy and hormonal treatment.  He now has evidence of disease progression on imaging and poor venous access.  He presents today for Port-A-Cath placement for palliative chemotherapy.Risks and benefits of image guided port-a-catheter placement was discussed with the patient including, but not limited to bleeding, infection, pneumothorax, or fibrin sheath development and need for additional procedures.  All of the patient's questions were answered, patient is agreeable to proceed. Consent signed and in chart.    Thank you for this interesting consult.  I greatly enjoyed meeting [name] and look forward to participating in their care.  A copy of this report was sent to the requesting provider on this date.  Electronically Signed: D. Rowe Robert, PA-C 11/21/2020, 9:55 AM   I spent a total of   25 minutes  in face to face in clinical consultation, greater than 50% of which was counseling/coordinating care for Port-A-Cath placement

## 2020-11-21 NOTE — Procedures (Signed)
Interventional Radiology Procedure Note  Procedure: Port placement.  Indication: Prostate ca  Findings: Please refer to procedural dictation for full description.  Complications: None  EBL: < 10 mL  Miachel Roux, MD [phone]

## 2020-11-22 NOTE — Progress Notes (Signed)
Pharmacist Chemotherapy Monitoring - Initial Assessment    Anticipated start date: 11/29/20   The following has been reviewed per standard work regarding the patient's treatment regimen: The patient's diagnosis, treatment plan and drug doses, and organ/hematologic function Lab orders and baseline tests specific to treatment regimen  The treatment plan start date, drug sequencing, and pre-medications Prior authorization status  Patient's documented medication list, including drug-drug interaction screen and prescriptions for anti-emetics and supportive care specific to the treatment regimen The drug concentrations, fluid compatibility, administration routes, and timing of the medications to be used The patient's access for treatment and lifetime cumulative dose history, if applicable  The patient's medication allergies and previous infusion related reactions, if applicable   Changes made to treatment plan:  N/A  Follow up needed:  Pending authorization for treatment    Philomena Course, [place], 11/22/2020  1:30 PM

## 2020-11-24 ENCOUNTER — Other Ambulatory Visit: Payer: Medicare Other

## 2020-11-28 ENCOUNTER — Encounter: Payer: Self-pay | Admitting: Oncology

## 2020-11-28 MED FILL — Dexamethasone Sodium Phosphate Inj 100 MG/10ML: INTRAMUSCULAR | Qty: 1 | Status: AC

## 2020-11-28 NOTE — Progress Notes (Signed)
Called pt to introduce myself as his Arboriculturist.  Unfortunately there aren't any foundations offering copay assistance for his Dx and the type of ins he has.  I offered the Alight and Prostate grants, went over what they cover and the income requirement.  Pt would like to apply so he will bring his proof of income on 11/29/20.  If approved I will give him an expense sheet and my card for any questions or concerns he may have in the future.

## 2020-11-29 ENCOUNTER — Inpatient Hospital Stay: Payer: Medicare Other | Attending: Oncology

## 2020-11-29 ENCOUNTER — Inpatient Hospital Stay: Payer: Medicare Other | Admitting: Oncology

## 2020-11-29 ENCOUNTER — Other Ambulatory Visit: Payer: Self-pay

## 2020-11-29 ENCOUNTER — Inpatient Hospital Stay: Payer: Medicare Other

## 2020-11-29 ENCOUNTER — Encounter: Payer: Self-pay | Admitting: Oncology

## 2020-11-29 VITALS — BP 140/80 | HR 74 | Temp 97.2°F | Resp 18 | Wt 237.0 lb

## 2020-11-29 DIAGNOSIS — C61 Malignant neoplasm of prostate: Secondary | ICD-10-CM | POA: Diagnosis present

## 2020-11-29 DIAGNOSIS — Z923 Personal history of irradiation: Secondary | ICD-10-CM | POA: Diagnosis not present

## 2020-11-29 DIAGNOSIS — Z79899 Other long term (current) drug therapy: Secondary | ICD-10-CM | POA: Insufficient documentation

## 2020-11-29 DIAGNOSIS — Z79818 Long term (current) use of other agents affecting estrogen receptors and estrogen levels: Secondary | ICD-10-CM | POA: Insufficient documentation

## 2020-11-29 DIAGNOSIS — Z5189 Encounter for other specified aftercare: Secondary | ICD-10-CM | POA: Diagnosis not present

## 2020-11-29 DIAGNOSIS — C7951 Secondary malignant neoplasm of bone: Secondary | ICD-10-CM | POA: Insufficient documentation

## 2020-11-29 DIAGNOSIS — Z5111 Encounter for antineoplastic chemotherapy: Secondary | ICD-10-CM | POA: Diagnosis not present

## 2020-11-29 DIAGNOSIS — Z95828 Presence of other vascular implants and grafts: Secondary | ICD-10-CM

## 2020-11-29 DIAGNOSIS — Z192 Hormone resistant malignancy status: Secondary | ICD-10-CM | POA: Diagnosis not present

## 2020-11-29 LAB — CMP (CANCER CENTER ONLY)
ALT: 10 U/L (ref 0–44)
AST: 14 U/L — ABNORMAL LOW (ref 15–41)
Albumin: 3.2 g/dL — ABNORMAL LOW (ref 3.5–5.0)
Alkaline Phosphatase: 132 U/L — ABNORMAL HIGH (ref 38–126)
Anion gap: 9 (ref 5–15)
BUN: 16 mg/dL (ref 8–23)
CO2: 22 mmol/L (ref 22–32)
Calcium: 8.2 mg/dL — ABNORMAL LOW (ref 8.9–10.3)
Chloride: 109 mmol/L (ref 98–111)
Creatinine: 0.87 mg/dL (ref 0.61–1.24)
GFR, Estimated: >60 mL/min (ref >60)
Glucose, Bld: 125 mg/dL — ABNORMAL HIGH (ref 70–99)
Potassium: 4.3 mmol/L (ref 3.5–5.1)
Sodium: 140 mmol/L (ref 135–145)
Total Bilirubin: 0.5 mg/dL (ref 0.3–1.2)
Total Protein: 6.1 g/dL — ABNORMAL LOW (ref 6.5–8.1)

## 2020-11-29 LAB — CBC WITH DIFFERENTIAL (CANCER CENTER ONLY)
Abs Immature Granulocytes: 0.05 10*3/uL (ref 0.00–0.07)
Basophils Absolute: 0 10*3/uL (ref 0.0–0.1)
Basophils Relative: 0 %
Eosinophils Absolute: 0.2 10*3/uL (ref 0.0–0.5)
Eosinophils Relative: 3 %
HCT: 33.3 % — ABNORMAL LOW (ref 39.0–52.0)
Hemoglobin: 11.1 g/dL — ABNORMAL LOW (ref 13.0–17.0)
Immature Granulocytes: 1 %
Lymphocytes Relative: 29 %
Lymphs Abs: 1.6 10*3/uL (ref 0.7–4.0)
MCH: 30.6 pg (ref 26.0–34.0)
MCHC: 33.3 g/dL (ref 30.0–36.0)
MCV: 91.7 fL (ref 80.0–100.0)
Monocytes Absolute: 0.4 10*3/uL (ref 0.1–1.0)
Monocytes Relative: 8 %
Neutro Abs: 3.3 10*3/uL (ref 1.7–7.7)
Neutrophils Relative %: 59 %
Platelet Count: 131 10*3/uL — ABNORMAL LOW (ref 150–400)
RBC: 3.63 MIL/uL — ABNORMAL LOW (ref 4.22–5.81)
RDW: 14 % (ref 11.5–15.5)
WBC Count: 5.6 10*3/uL (ref 4.0–10.5)
nRBC: 0 % (ref 0.0–0.2)

## 2020-11-29 MED ORDER — LEUPROLIDE ACETATE (4 MONTH) 30 MG ~~LOC~~ KIT
30.0000 mg | PACK | Freq: Once | SUBCUTANEOUS | Status: AC
  Administered 2020-11-29: 30 mg via SUBCUTANEOUS
  Filled 2020-11-29: qty 30

## 2020-11-29 MED ORDER — HEPARIN SOD (PORK) LOCK FLUSH 100 UNIT/ML IV SOLN
500.0000 [IU] | Freq: Once | INTRAVENOUS | Status: AC | PRN
  Administered 2020-11-29: 500 [IU]

## 2020-11-29 MED ORDER — SODIUM CHLORIDE 0.9% FLUSH
10.0000 mL | INTRAVENOUS | Status: AC | PRN
  Administered 2020-11-29: 10 mL

## 2020-11-29 MED ORDER — DENOSUMAB 120 MG/1.7ML ~~LOC~~ SOLN
120.0000 mg | Freq: Once | SUBCUTANEOUS | Status: AC
  Administered 2020-11-29: 120 mg via SUBCUTANEOUS
  Filled 2020-11-29: qty 1.7

## 2020-11-29 MED ORDER — SODIUM CHLORIDE 0.9% FLUSH
10.0000 mL | INTRAVENOUS | Status: DC | PRN
  Administered 2020-11-29: 10 mL

## 2020-11-29 MED ORDER — SODIUM CHLORIDE 0.9 % IV SOLN: Freq: Once | INTRAVENOUS | Status: AC

## 2020-11-29 MED ORDER — SODIUM CHLORIDE 0.9 % IV SOLN
75.0000 mg/m2 | Freq: Once | INTRAVENOUS | Status: AC
  Administered 2020-11-29: 180 mg via INTRAVENOUS
  Filled 2020-11-29: qty 18

## 2020-11-29 MED ORDER — SODIUM CHLORIDE 0.9 % IV SOLN
10.0000 mg | Freq: Once | INTRAVENOUS | Status: AC
  Administered 2020-11-29: 10 mg via INTRAVENOUS
  Filled 2020-11-29: qty 10

## 2020-11-29 NOTE — Progress Notes (Signed)
Pt is approved for the $1000 Alight and $200 Prostate grants.

## 2020-11-29 NOTE — Progress Notes (Signed)
Hematology and Oncology Follow Up Visit  [name] [id number] October 20, 1945 75 y.o. 11/29/2020 7:48 AM   Principle Diagnosis: 75 year old man with castration-resistant advanced prostate cancer with disease to the bone diagnosed in 2013.  He presented in 2003 with localized disease, Gleason score 4 + 3 = 7 and PSA 6.45.   Prior Therapy:  1. He is status post a prostatectomy and lymph node dissection done on November 05, 2001.   2. S/P 6480 cGy of radiation therapy in 36 fractions between February and April 2005.   3. He developed advanced disease and was treated with Lupron in 2011.  His PSA nadir down to 4.11. PSA was up to 5.91 in December of 2012 with a testosterone level of 35. His staging workup did not reveal any bony metastasis.  4. Casodex was added to Lupron on 05/2011. This was discontinued in January 2016 due to progression of disease.  5. Zytiga 1000 mg daily started on 05/13/2014.  Therapy discontinued in December 2018 because of progression of disease. Bone scan of the time showed limited metastatic disease.  6.  He is status post radiation to pelvic lymph nodes between October 5 and December 11, 2018.  He received total of 40 Gray in 5 fractions.  7. Xtandi 160 mg daily started in December 2018.  Therapy discontinued in September 2021.  8. Xofigo monthly infusion started in October 2021.  He completed 6 months of therapy.  Current therapy:   Taxotere chemotherapy 75 mg per metered square every 3 weeks.  Cycle 1 started on November 29, 2020.  Eligard 30 mg every 4 months.  He will receive Eligard today.  Xgeva 120 mg given every 4 weeks started in October 2021.  This will be repeated every 6 weeks while on chemotherapy.    Interim History: Mr. Craton returns today for a follow-up visit.  Since the last visit, he reports feeling well without any major complaints.  He denies any nausea, vomiting or abdominal pain.  He denies any bone pain or pathological fractures.   His performance status quality of life remain excellent.          Medications: Updated on review. Current Outpatient Medications  Medication Sig Dispense Refill   ALPRAZolam (XANAX) 0.5 MG tablet Take 0.5 mg by mouth 3 (three) times daily as needed for sleep. (Patient not taking: No sig reported)     amLODipine (NORVASC) 5 MG tablet amlodipine 5 mg tablet  Take 1 tablet every day by oral route.     Ascorbic Acid (VITAMIN C) 100 MG CHEW Vitamin C  1 once a day     aspirin EC 81 MG tablet Take 81 mg by mouth daily.     Bergamot Oil OIL Bergamot  Take 1 or 2 capsules daily     calcium-vitamin D (OSCAL 500/200 D-3) 500-200 MG-UNIT tablet Take 1 tablet by mouth daily with breakfast. 1 tablet    Cholecalciferol (VITAMIN D3) 3000 units TABS cholecalciferol (vitamin D3) 1,000 unit tablet  1 tablet orally once a day     Leuprolide Acetate (LUPRON DEPOT IM) Inject into the muscle every 4 (four) months.     lidocaine-prilocaine (EMLA) cream Apply 1 application topically as needed. 30 g 0   Multiple Vitamins-Minerals (MULTIVITAMIN ADULT PO) multivitamin  1 tablet orally once a day     NON FORMULARY CBD oil 500 mg  Take 1 dropperful under tongue in the evening or at bedtime     Omega-3 Fatty Acids (FISH OIL)  1000 MG CAPS Fish Oil  2 tablet po daily     prochlorperazine (COMPAZINE) 10 MG tablet Take 1 tablet (10 mg total) by mouth every 6 (six) hours as needed for nausea or vomiting. 30 tablet 0   XTANDI 40 MG tablet TAKE 4 TABLETS (160 MG TOTAL) BY MOUTH DAILY. 120 tablet 0   No current facility-administered medications for this visit.   Facility-Administered Medications Ordered in Other Visits  Medication Dose Route Frequency Provider Last Rate Last Admin   sodium chloride flush (NS) 0.9 % injection 10 mL  10 mL Intracatheter PRN [name], MD        Allergies:  Allergies  Allergen Reactions   Penicillins Rash and Other (See Comments)    Has patient had a PCN reaction causing  immediate rash, facial/tongue/throat swelling, SOB or lightheadedness with hypotension: YES Has patient had a PCN reaction causing severe rash involving mucus membranes or skin necrosis: YES Has patient had a PCN reaction that required hospitalization: NO Has patient had a PCN reaction occurring within the last 10 years: NO       Physical Exam:       Blood pressure 140/80, pulse 74, temperature (!) 97.2 F (36.2 C), temperature source Tympanic, resp. rate 18, weight 237 lb (107.5 kg), SpO2 100 %.      ECOG: 1    General appearance: Alert, awake without any distress. Head: Atraumatic without abnormalities Oropharynx: Without any thrush or ulcers. Eyes: No scleral icterus. Lymph nodes: No lymphadenopathy noted in the cervical, supraclavicular, or axillary nodes Heart:regular rate and rhythm, without any murmurs or gallops.   Lung: Clear to auscultation without any rhonchi, wheezes or dullness to percussion. Abdomin: Soft, nontender without any shifting dullness or ascites. Musculoskeletal: No clubbing or cyanosis. Neurological: No motor or sensory deficits. Skin: No rashes or lesions.                 Lab Results: Lab Results  Component Value Date   WBC 5.9 10/27/2020   HGB 12.3 (L) 10/27/2020   HCT 36.7 (L) 10/27/2020   MCV 91.8 10/27/2020   PLT 156 10/27/2020     Chemistry      Component Value Date/Time   NA 139 10/27/2020 1408   NA 141 02/01/2017 0749   K 4.6 10/27/2020 1408   K 3.5 02/01/2017 0749   CL 106 10/27/2020 1408   CL 104 06/11/2012 0817   CO2 24 10/27/2020 1408   CO2 26 02/01/2017 0749   BUN 19 10/27/2020 1408   BUN 14.5 02/01/2017 0749   CREATININE 1.01 10/27/2020 1408   CREATININE 0.9 02/01/2017 0749      Component Value Date/Time   CALCIUM 8.8 (L) 10/27/2020 1408   CALCIUM 9.2 02/01/2017 0749   ALKPHOS 138 (H) 10/27/2020 1408   ALKPHOS 70 02/01/2017 0749   AST 14 (L) 10/27/2020 1408   AST 17 02/01/2017 0749   ALT 11  10/27/2020 1408   ALT 14 02/01/2017 0749   BILITOT 0.5 10/27/2020 1408   BILITOT 0.95 02/01/2017 0749     Results for Lites, BRYLER DIBBLE (MRN [id number]) as of 11/29/2020 07:46  Ref. Range 09/23/2020 15:27 09/30/2020 09:05 10/27/2020 14:08  Prostate Specific Ag, Serum Latest Ref Range: 0.0 - 4.0 ng/mL 510.0 (H) 496.0 (H) 745.0 (H)   IMPRESSION: 1. Substantial increase in widespread osseous metastatic disease in the axial and to a lesser extent appendicular skeleton.  Impression and Plan:  75 year old man with:  1.  Castration-resistant advanced  prostate cancer with disease to the bone initially presented in 2013.  His disease status was updated at this time and the risks and benefits of all options were reviewed.  His PET scan obtained on 11/10/2019 was personally reviewed again and discussed with the patient.  He has developed a more extensive bone disease and chemotherapy will offer the best palliation.  He is ready to proceed with Taxotere chemotherapy today.  Complications were reiterated including nausea, vomiting, myelosuppression, neutropenia and possible sepsis.  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  He is agreeable to proceed    2. Androgen deprivation.  This will be received today and repeated every 4 months.  Complications that include hot flashes and weight gain reiterated.   3.  Bone directed therapy: This will be repeated every 6 weeks while on chemotherapy.  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  4.  Growth factor support.  He is at risk of developing neutropenia and possible sepsis.  He will receive injection after each cycle of therapy.  5.  Prognosis and goals of care: Therapy remains palliative although aggressive measures are warranted given his excellent performance status.  6. Follow up: In 3 weeks for the next cycle of therapy.  30 minutes were spent on this encounter.  The time was dedicated to reviewing laboratory  data, disease status update, treatment choices and future plan of care discussion.  Zola Button, MD 10/4/20227:48 AM

## 2020-11-29 NOTE — Patient Instructions (Signed)
[facility]COL[facility]   ?Discharge Instructions: ?Thank you for choosing [facility] to provide your oncology and hematology care.  ? ?If you have a lab appointment with the Cancer Center, please go directly to the Cancer Center and check in at the registration area. ?  ?Wear comfortable clothing and clothing appropriate for easy access to any Portacath or PICC line.  ? ?We strive to give you quality time with your provider. You may need to reschedule your appointment if you arrive late (15 or more minutes).  Arriving late affects you and other patients whose appointments are after yours.  Also, if you miss three or more appointments without notifying the office, you may be dismissed from the clinic at the provider?s discretion.    ?  ?For prescription refill requests, have your pharmacy contact our office and allow 72 hours for refills to be completed.   ? ?Today you received the following chemotherapy and/or immunotherapy agents: docetaxel    ?  ?To help prevent nausea and vomiting after your treatment, we encourage you to take your nausea medication as directed. ? ?BELOW ARE SYMPTOMS THAT SHOULD BE REPORTED IMMEDIATELY: ?*FEVER GREATER THAN 100.4 F (38 ?C) OR HIGHER ?*CHILLS OR SWEATING ?*NAUSEA AND VOMITING THAT IS NOT CONTROLLED WITH YOUR NAUSEA MEDICATION ?*UNUSUAL SHORTNESS OF BREATH ?*UNUSUAL BRUISING OR BLEEDING ?*URINARY PROBLEMS (pain or burning when urinating, or frequent urination) ?*BOWEL PROBLEMS (unusual diarrhea, constipation, pain near the anus) ?TENDERNESS IN MOUTH AND THROAT WITH OR WITHOUT PRESENCE OF ULCERS (sore throat, sores in mouth, or a toothache) ?UNUSUAL RASH, SWELLING OR PAIN  ?UNUSUAL VAGINAL DISCHARGE OR ITCHING  ? ?Items with * indicate a potential emergency and should be followed up as soon as possible or go to the Emergency Department if any problems should occur. ? ?Please show the CHEMOTHERAPY ALERT CARD or IMMUNOTHERAPY ALERT CARD at check-in  to the Emergency Department and triage nurse. ? ?Should you have questions after your visit or need to cancel or reschedule your appointment, please contact [facility]  Dept: [phone]  and follow the prompts.  Office hours are 8:00 a.m. to 4:30 p.m. Monday - Friday. Please note that voicemails left after 4:00 p.m. may not be returned until the following business day.  We are closed weekends and major holidays. You have access to a nurse at all times for urgent questions. Please call the main number to the clinic Dept: [phone] and follow the prompts. ? ? ?For any non-urgent questions, you may also contact your provider using MyChart. We now offer e-Visits for anyone 18 and older to request care online for non-urgent symptoms. For details visit [email or web address]. ?  ?Also download the MyChart app! Go to the app store, search "MyChart", open the app, select [facility], and log in with your MyChart username and password. ? ?Due to Covid, a mask is required upon entering the hospital/clinic. If you do not have a mask, one will be given to you upon arrival. For doctor visits, patients may have 1 support person aged 18 or older with them. For treatment visits, patients cannot have anyone with them due to current Covid guidelines and our immunocompromised population.  ? ?

## 2020-11-30 LAB — PROSTATE-SPECIFIC AG, SERUM (LABCORP): Prostate Specific Ag, Serum: 777 ng/mL — ABNORMAL HIGH (ref 0.0–4.0)

## 2020-12-01 ENCOUNTER — Inpatient Hospital Stay: Payer: Medicare Other

## 2020-12-01 ENCOUNTER — Other Ambulatory Visit: Payer: Self-pay

## 2020-12-01 VITALS — BP 128/62 | HR 86 | Temp 98.2°F | Resp 20

## 2020-12-01 DIAGNOSIS — C61 Malignant neoplasm of prostate: Secondary | ICD-10-CM | POA: Diagnosis not present

## 2020-12-01 MED ORDER — PEGFILGRASTIM-CBQV 6 MG/0.6ML ~~LOC~~ SOSY
6.0000 mg | PREFILLED_SYRINGE | Freq: Once | SUBCUTANEOUS | Status: AC
  Administered 2020-12-01: 6 mg via SUBCUTANEOUS

## 2020-12-01 NOTE — Patient Instructions (Signed)

## 2020-12-19 MED FILL — Dexamethasone Sodium Phosphate Inj 100 MG/10ML: INTRAMUSCULAR | Qty: 1 | Status: AC

## 2020-12-20 ENCOUNTER — Inpatient Hospital Stay: Payer: Medicare Other | Admitting: Oncology

## 2020-12-20 ENCOUNTER — Inpatient Hospital Stay: Payer: Medicare Other

## 2020-12-20 ENCOUNTER — Other Ambulatory Visit: Payer: Self-pay

## 2020-12-20 VITALS — BP 134/75 | HR 74 | Temp 97.8°F | Resp 18 | Ht 75.0 in | Wt 229.5 lb

## 2020-12-20 DIAGNOSIS — C61 Malignant neoplasm of prostate: Secondary | ICD-10-CM | POA: Diagnosis not present

## 2020-12-20 DIAGNOSIS — Z95828 Presence of other vascular implants and grafts: Secondary | ICD-10-CM

## 2020-12-20 LAB — CBC WITH DIFFERENTIAL (CANCER CENTER ONLY)
Abs Immature Granulocytes: 0.05 10*3/uL (ref 0.00–0.07)
Basophils Absolute: 0 10*3/uL (ref 0.0–0.1)
Basophils Relative: 1 %
Eosinophils Absolute: 0 10*3/uL (ref 0.0–0.5)
Eosinophils Relative: 0 %
HCT: 31.2 % — ABNORMAL LOW (ref 39.0–52.0)
Hemoglobin: 10.4 g/dL — ABNORMAL LOW (ref 13.0–17.0)
Immature Granulocytes: 1 %
Lymphocytes Relative: 23 %
Lymphs Abs: 1.3 10*3/uL (ref 0.7–4.0)
MCH: 30.6 pg (ref 26.0–34.0)
MCHC: 33.3 g/dL (ref 30.0–36.0)
MCV: 91.8 fL (ref 80.0–100.0)
Monocytes Absolute: 0.5 10*3/uL (ref 0.1–1.0)
Monocytes Relative: 9 %
Neutro Abs: 3.8 10*3/uL (ref 1.7–7.7)
Neutrophils Relative %: 66 %
Platelet Count: 145 10*3/uL — ABNORMAL LOW (ref 150–400)
RBC: 3.4 MIL/uL — ABNORMAL LOW (ref 4.22–5.81)
RDW: 15.2 % (ref 11.5–15.5)
WBC Count: 5.7 10*3/uL (ref 4.0–10.5)
nRBC: 0 % (ref 0.0–0.2)

## 2020-12-20 LAB — CMP (CANCER CENTER ONLY)
ALT: 17 U/L (ref 0–44)
AST: 15 U/L (ref 15–41)
Albumin: 3.3 g/dL — ABNORMAL LOW (ref 3.5–5.0)
Alkaline Phosphatase: 107 U/L (ref 38–126)
Anion gap: 8 (ref 5–15)
BUN: 20 mg/dL (ref 8–23)
CO2: 23 mmol/L (ref 22–32)
Calcium: 8.3 mg/dL — ABNORMAL LOW (ref 8.9–10.3)
Chloride: 108 mmol/L (ref 98–111)
Creatinine: 0.86 mg/dL (ref 0.61–1.24)
GFR, Estimated: >60 mL/min (ref >60)
Glucose, Bld: 116 mg/dL — ABNORMAL HIGH (ref 70–99)
Potassium: 4.5 mmol/L (ref 3.5–5.1)
Sodium: 139 mmol/L (ref 135–145)
Total Bilirubin: 0.6 mg/dL (ref 0.3–1.2)
Total Protein: 6.3 g/dL — ABNORMAL LOW (ref 6.5–8.1)

## 2020-12-20 MED ORDER — SODIUM CHLORIDE 0.9% FLUSH: 10.0000 mL | INTRAVENOUS | Status: DC | PRN

## 2020-12-20 MED ORDER — SODIUM CHLORIDE 0.9 % IV SOLN
10.0000 mg | Freq: Once | INTRAVENOUS | Status: AC
  Administered 2020-12-20: 10 mg via INTRAVENOUS
  Filled 2020-12-20: qty 10

## 2020-12-20 MED ORDER — SODIUM CHLORIDE 0.9 % IV SOLN: Freq: Once | INTRAVENOUS | Status: AC

## 2020-12-20 MED ORDER — HEPARIN SOD (PORK) LOCK FLUSH 100 UNIT/ML IV SOLN: 500.0000 [IU] | Freq: Once | INTRAVENOUS | Status: DC | PRN

## 2020-12-20 MED ORDER — SODIUM CHLORIDE 0.9 % IV SOLN
75.0000 mg/m2 | Freq: Once | INTRAVENOUS | Status: AC
  Administered 2020-12-20: 180 mg via INTRAVENOUS
  Filled 2020-12-20: qty 18

## 2020-12-20 MED ORDER — SODIUM CHLORIDE 0.9% FLUSH
10.0000 mL | INTRAVENOUS | Status: AC | PRN
  Administered 2020-12-20: 10 mL

## 2020-12-20 NOTE — Patient Instructions (Signed)
[facility]COL[facility]  Discharge Instructions: °Thank you for choosing [facility] to provide your oncology and hematology care.  ° °If you have a lab appointment with the Cancer Center, please go directly to the Cancer Center and check in at the registration area. °  °Wear comfortable clothing and clothing appropriate for easy access to any Portacath or PICC line.  ° °We strive to give you quality time with your provider. You may need to reschedule your appointment if you arrive late (15 or more minutes).  Arriving late affects you and other patients whose appointments are after yours.  Also, if you miss three or more appointments without notifying the office, you may be dismissed from the clinic at the provider’s discretion.    °  °For prescription refill requests, have your pharmacy contact our office and allow 72 hours for refills to be completed.   ° °Today you received the following chemotherapy and/or immunotherapy agents Docetaxel    °  °To help prevent nausea and vomiting after your treatment, we encourage you to take your nausea medication as directed. ° °BELOW ARE SYMPTOMS THAT SHOULD BE REPORTED IMMEDIATELY: °*FEVER GREATER THAN 100.4 F (38 °C) OR HIGHER °*CHILLS OR SWEATING °*NAUSEA AND VOMITING THAT IS NOT CONTROLLED WITH YOUR NAUSEA MEDICATION °*UNUSUAL SHORTNESS OF BREATH °*UNUSUAL BRUISING OR BLEEDING °*URINARY PROBLEMS (pain or burning when urinating, or frequent urination) °*BOWEL PROBLEMS (unusual diarrhea, constipation, pain near the anus) °TENDERNESS IN MOUTH AND THROAT WITH OR WITHOUT PRESENCE OF ULCERS (sore throat, sores in mouth, or a toothache) °UNUSUAL RASH, SWELLING OR PAIN  °UNUSUAL VAGINAL DISCHARGE OR ITCHING  ° °Items with * indicate a potential emergency and should be followed up as soon as possible or go to the Emergency Department if any problems should occur. ° °Please show the CHEMOTHERAPY ALERT CARD or IMMUNOTHERAPY ALERT CARD at check-in to  the Emergency Department and triage nurse. ° °Should you have questions after your visit or need to cancel or reschedule your appointment, please contact [facility]  Dept: [phone]  and follow the prompts.  Office hours are 8:00 a.m. to 4:30 p.m. Monday - Friday. Please note that voicemails left after 4:00 p.m. may not be returned until the following business day.  We are closed weekends and major holidays. You have access to a nurse at all times for urgent questions. Please call the main number to the clinic Dept: [phone] and follow the prompts. ° ° °For any non-urgent questions, you may also contact your provider using MyChart. We now offer e-Visits for anyone 18 and older to request care online for non-urgent symptoms. For details visit [email or web address]. °  °Also download the MyChart app! Go to the app store, search "MyChart", open the app, select [facility], and log in with your MyChart username and password. ° °Due to Covid, a mask is required upon entering the hospital/clinic. If you do not have a mask, one will be given to you upon arrival. For doctor visits, patients may have 1 support person aged 18 or older with them. For treatment visits, patients cannot have anyone with them due to current Covid guidelines and our immunocompromised population.  ° °

## 2020-12-20 NOTE — Progress Notes (Signed)
Hematology and Oncology Follow Up Visit  [name] [id number] 1946/01/19 75 y.o. 12/20/2020 8:23 AM   Principle Diagnosis: 76 year old man with advanced prostate cancer disease to the bone diagnosed in 2013.  He has castration-resistant after presenting with localized disease, Gleason score 4 + 3 = 7 and PSA 6.45 in 2003.   Prior Therapy:  1. He is status post a prostatectomy and lymph node dissection done on November 05, 2001.   2. S/P 6480 cGy of radiation therapy in 36 fractions between February and April 2005.   3. He developed advanced disease and was treated with Lupron in 2011.  His PSA nadir down to 4.11. PSA was up to 5.91 in December of 2012 with a testosterone level of 35. His staging workup did not reveal any bony metastasis.  4. Casodex was added to Lupron on 05/2011. This was discontinued in January 2016 due to progression of disease.  5. Zytiga 1000 mg daily started on 05/13/2014.  Therapy discontinued in December 2018 because of progression of disease. Bone scan of the time showed limited metastatic disease.  6.  He is status post radiation to pelvic lymph nodes between October 5 and December 11, 2018.  He received total of 40 Gray in 5 fractions.  7. Xtandi 160 mg daily started in December 2018.  Therapy discontinued in September 2021.  8. Xofigo monthly infusion started in October 2021.  He completed 6 months of therapy.  Current therapy:   Taxotere chemotherapy 75 mg per metered square every 3 weeks started in November 29, 2020.  He is here for cycle 2 of therapy.  Eligard 30 mg every 4 months.  Next Eligard will be given in February 2023.  Xgeva 120 mg given every 4 weeks started in October 2021.  This will be repeated every 6 weeks with the next injection in November 2022.    Interim History: [name] is here for return evaluation.  Since the last visit, he received the first cycle of chemotherapy without any complaints.  He denies any nausea,  vomiting or abdominal pain.  He does report some mild fatigue and tiredness but no weakness or unsteadiness.  He denies any worsening neuropathy pain.  He remains active and attends to activities of daily living.          Medications: Reviewed without changes. Current Outpatient Medications  Medication Sig Dispense Refill   ALPRAZolam (XANAX) 0.5 MG tablet Take 0.5 mg by mouth 3 (three) times daily as needed for sleep. (Patient not taking: No sig reported)     amLODipine (NORVASC) 5 MG tablet amlodipine 5 mg tablet  Take 1 tablet every day by oral route.     Ascorbic Acid (VITAMIN C) 100 MG CHEW Vitamin C  1 once a day     aspirin EC 81 MG tablet Take 81 mg by mouth daily.     Bergamot Oil OIL Bergamot  Take 1 or 2 capsules daily     calcium-vitamin D (OSCAL 500/200 D-3) 500-200 MG-UNIT tablet Take 1 tablet by mouth daily with breakfast. 1 tablet    Cholecalciferol (VITAMIN D3) 3000 units TABS cholecalciferol (vitamin D3) 1,000 unit tablet  1 tablet orally once a day     Leuprolide Acetate (LUPRON DEPOT IM) Inject into the muscle every 4 (four) months.     lidocaine-prilocaine (EMLA) cream Apply 1 application topically as needed. 30 g 0   Multiple Vitamins-Minerals (MULTIVITAMIN ADULT PO) multivitamin  1 tablet orally once a day  NON FORMULARY CBD oil 500 mg  Take 1 dropperful under tongue in the evening or at bedtime     Omega-3 Fatty Acids (FISH OIL) 1000 MG CAPS Fish Oil  2 tablet po daily     prochlorperazine (COMPAZINE) 10 MG tablet Take 1 tablet (10 mg total) by mouth every 6 (six) hours as needed for nausea or vomiting. 30 tablet 0   XTANDI 40 MG tablet TAKE 4 TABLETS (160 MG TOTAL) BY MOUTH DAILY. 120 tablet 0   No current facility-administered medications for this visit.   Facility-Administered Medications Ordered in Other Visits  Medication Dose Route Frequency Provider Last Rate Last Admin   sodium chloride flush (NS) 0.9 % injection 10 mL  10 mL Intracatheter PRN  [name], MD        Allergies:  Allergies  Allergen Reactions   Penicillins Rash and Other (See Comments)    Has patient had a PCN reaction causing immediate rash, facial/tongue/throat swelling, SOB or lightheadedness with hypotension: YES Has patient had a PCN reaction causing severe rash involving mucus membranes or skin necrosis: YES Has patient had a PCN reaction that required hospitalization: NO Has patient had a PCN reaction occurring within the last 10 years: NO       Physical Exam:        Blood pressure 134/75, pulse 74, temperature 97.8 F (36.6 C), temperature source Tympanic, resp. rate 18, height 6\' 3"  (1.905 m), weight 229 lb 8 oz (104.1 kg), SpO2 100 %.      ECOG: 1   General appearance: Comfortable appearing without any discomfort Head: Normocephalic without any trauma Oropharynx: Mucous membranes are moist and pink without any thrush or ulcers. Eyes: Pupils are equal and round reactive to light. Lymph nodes: No cervical, supraclavicular, inguinal or axillary lymphadenopathy.   Heart:regular rate and rhythm.  S1 and S2 without leg edema. Lung: Clear without any rhonchi or wheezes.  No dullness to percussion. Abdomin: Soft, nontender, nondistended with good bowel sounds.  No hepatosplenomegaly. Musculoskeletal: No joint deformity or effusion.  Full range of motion noted. Neurological: No deficits noted on motor, sensory and deep tendon reflex exam. Skin: No petechial rash or dryness.  Appeared moist.                   Lab Results: Lab Results  Component Value Date   WBC 5.6 11/29/2020   HGB 11.1 (L) 11/29/2020   HCT 33.3 (L) 11/29/2020   MCV 91.7 11/29/2020   PLT 131 (L) 11/29/2020     Chemistry      Component Value Date/Time   NA 140 11/29/2020 0751   NA 141 02/01/2017 0749   K 4.3 11/29/2020 0751   K 3.5 02/01/2017 0749   CL 109 11/29/2020 0751   CL 104 06/11/2012 0817   CO2 22 11/29/2020 0751   CO2 26 02/01/2017  0749   BUN 16 11/29/2020 0751   BUN 14.5 02/01/2017 0749   CREATININE 0.87 11/29/2020 0751   CREATININE 0.9 02/01/2017 0749      Component Value Date/Time   CALCIUM 8.2 (L) 11/29/2020 0751   CALCIUM 9.2 02/01/2017 0749   ALKPHOS 132 (H) 11/29/2020 0751   ALKPHOS 70 02/01/2017 0749   AST 14 (L) 11/29/2020 0751   AST 17 02/01/2017 0749   ALT 10 11/29/2020 0751   ALT 14 02/01/2017 0749   BILITOT 0.5 11/29/2020 0751   BILITOT 0.95 02/01/2017 0749      Results for Raggio, Gwyndolyn Saxon B (  MRN [id number]) as of 12/20/2020 08:26  Ref. Range 09/30/2020 09:05 10/27/2020 14:08 11/29/2020 07:51  Prostate Specific Ag, Serum Latest Ref Range: 0.0 - 4.0 ng/mL 496.0 (H) 745.0 (H) 777.0 (H)     Impression and Plan:  75 year old man with:  1.  Advanced prostate cancer with disease to the bone diagnosed in 2013.  He has castration-resistant disease at this time.  He is currently on Taxotere chemotherapy which she has received without any major complications.  Risks and benefits of continuing this treatment long-term were discussed.  Complications that include nausea, vomiting, myelosuppression and neuropathy was reiterated.  He is agreeable to proceed at this time.  We will continue to monitor his PSA in the interim.    2. Androgen deprivation.  Next injection of Eligard will be given February 2023.   3.  Bone directed therapy: He is currently on Xgeva which will be given every 6 weeks.  Next injection will be in November 2022.  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  4.  Growth factor support.  Will receive growth factor support after each cycle of therapy.  He is at risk of developing neutropenia.  5.  Prognosis and goals of care: His disease is incurable although aggressive measures are warranted with excellent performance status.  6. Follow up: We will return in 3 weeks for a follow-up visit.  30 minutes were dedicated to  this visit.  The time was spent on reviewing laboratory data, disease status update, addressing complications related to cancer and cancer therapy.  Zola Button, MD 10/25/20228:23 AM

## 2020-12-21 ENCOUNTER — Telehealth: Payer: Self-pay | Admitting: *Deleted

## 2020-12-21 ENCOUNTER — Telehealth: Payer: Self-pay | Admitting: Oncology

## 2020-12-21 LAB — PROSTATE-SPECIFIC AG, SERUM (LABCORP): Prostate Specific Ag, Serum: 899 ng/mL — ABNORMAL HIGH (ref 0.0–4.0)

## 2020-12-21 NOTE — Telephone Encounter (Signed)
PC to patient, informed him of message below.  He verbalizes understanding.

## 2020-12-21 NOTE — Telephone Encounter (Signed)
-----   Message from [name], MD sent at 12/21/2020  8:55 AM EDT ----- Please let him know his PSA is up. We discussed this possibility yesterday. I anticipate the PSA will come down in the future.

## 2020-12-21 NOTE — Telephone Encounter (Signed)
Scheduled per 10/15 los, pt has been called and confirmed appt

## 2020-12-22 ENCOUNTER — Inpatient Hospital Stay: Payer: Medicare Other

## 2020-12-22 ENCOUNTER — Other Ambulatory Visit: Payer: Self-pay

## 2020-12-22 VITALS — BP 118/61 | HR 79 | Temp 98.2°F | Resp 18

## 2020-12-22 DIAGNOSIS — C61 Malignant neoplasm of prostate: Secondary | ICD-10-CM

## 2020-12-22 MED ORDER — PEGFILGRASTIM-CBQV 6 MG/0.6ML ~~LOC~~ SOSY
6.0000 mg | PREFILLED_SYRINGE | Freq: Once | SUBCUTANEOUS | Status: AC
  Administered 2020-12-22: 6 mg via SUBCUTANEOUS
  Filled 2020-12-22: qty 0.6

## 2021-01-09 MED FILL — Dexamethasone Sodium Phosphate Inj 100 MG/10ML: INTRAMUSCULAR | Qty: 1 | Status: AC

## 2021-01-10 ENCOUNTER — Inpatient Hospital Stay: Payer: Medicare Other

## 2021-01-10 ENCOUNTER — Inpatient Hospital Stay: Payer: Medicare Other | Attending: Oncology

## 2021-01-10 ENCOUNTER — Other Ambulatory Visit: Payer: Self-pay

## 2021-01-10 ENCOUNTER — Inpatient Hospital Stay: Payer: Medicare Other | Admitting: Oncology

## 2021-01-10 VITALS — BP 125/71 | HR 74 | Temp 98.2°F | Resp 17 | Ht 75.0 in | Wt 229.7 lb

## 2021-01-10 DIAGNOSIS — Z5111 Encounter for antineoplastic chemotherapy: Secondary | ICD-10-CM | POA: Diagnosis not present

## 2021-01-10 DIAGNOSIS — Z79899 Other long term (current) drug therapy: Secondary | ICD-10-CM | POA: Diagnosis not present

## 2021-01-10 DIAGNOSIS — Z5189 Encounter for other specified aftercare: Secondary | ICD-10-CM | POA: Insufficient documentation

## 2021-01-10 DIAGNOSIS — C61 Malignant neoplasm of prostate: Secondary | ICD-10-CM

## 2021-01-10 DIAGNOSIS — Z191 Hormone sensitive malignancy status: Secondary | ICD-10-CM | POA: Insufficient documentation

## 2021-01-10 DIAGNOSIS — Z9221 Personal history of antineoplastic chemotherapy: Secondary | ICD-10-CM | POA: Diagnosis not present

## 2021-01-10 DIAGNOSIS — Z95828 Presence of other vascular implants and grafts: Secondary | ICD-10-CM

## 2021-01-10 DIAGNOSIS — Z923 Personal history of irradiation: Secondary | ICD-10-CM | POA: Diagnosis not present

## 2021-01-10 DIAGNOSIS — C7951 Secondary malignant neoplasm of bone: Secondary | ICD-10-CM | POA: Insufficient documentation

## 2021-01-10 LAB — CBC WITH DIFFERENTIAL (CANCER CENTER ONLY)
Abs Immature Granulocytes: 0.04 10*3/uL (ref 0.00–0.07)
Basophils Absolute: 0.1 10*3/uL (ref 0.0–0.1)
Basophils Relative: 1 %
Eosinophils Absolute: 0 10*3/uL (ref 0.0–0.5)
Eosinophils Relative: 1 %
HCT: 30.7 % — ABNORMAL LOW (ref 39.0–52.0)
Hemoglobin: 9.9 g/dL — ABNORMAL LOW (ref 13.0–17.0)
Immature Granulocytes: 1 %
Lymphocytes Relative: 19 %
Lymphs Abs: 1.1 10*3/uL (ref 0.7–4.0)
MCH: 30.7 pg (ref 26.0–34.0)
MCHC: 32.2 g/dL (ref 30.0–36.0)
MCV: 95 fL (ref 80.0–100.0)
Monocytes Absolute: 0.6 10*3/uL (ref 0.1–1.0)
Monocytes Relative: 10 %
Neutro Abs: 4.1 10*3/uL (ref 1.7–7.7)
Neutrophils Relative %: 68 %
Platelet Count: 139 10*3/uL — ABNORMAL LOW (ref 150–400)
RBC: 3.23 MIL/uL — ABNORMAL LOW (ref 4.22–5.81)
RDW: 17.2 % — ABNORMAL HIGH (ref 11.5–15.5)
WBC Count: 5.9 10*3/uL (ref 4.0–10.5)
nRBC: 0 % (ref 0.0–0.2)

## 2021-01-10 LAB — CMP (CANCER CENTER ONLY)
ALT: 13 U/L (ref 0–44)
AST: 16 U/L (ref 15–41)
Albumin: 3.4 g/dL — ABNORMAL LOW (ref 3.5–5.0)
Alkaline Phosphatase: 93 U/L (ref 38–126)
Anion gap: 7 (ref 5–15)
BUN: 20 mg/dL (ref 8–23)
CO2: 24 mmol/L (ref 22–32)
Calcium: 8.5 mg/dL — ABNORMAL LOW (ref 8.9–10.3)
Chloride: 107 mmol/L (ref 98–111)
Creatinine: 0.84 mg/dL (ref 0.61–1.24)
GFR, Estimated: >60 mL/min (ref >60)
Glucose, Bld: 106 mg/dL — ABNORMAL HIGH (ref 70–99)
Potassium: 4.5 mmol/L (ref 3.5–5.1)
Sodium: 138 mmol/L (ref 135–145)
Total Bilirubin: 0.5 mg/dL (ref 0.3–1.2)
Total Protein: 6.4 g/dL — ABNORMAL LOW (ref 6.5–8.1)

## 2021-01-10 MED ORDER — SODIUM CHLORIDE 0.9% FLUSH
10.0000 mL | INTRAVENOUS | Status: DC | PRN
  Administered 2021-01-10: 10 mL

## 2021-01-10 MED ORDER — DENOSUMAB 120 MG/1.7ML ~~LOC~~ SOLN
120.0000 mg | Freq: Once | SUBCUTANEOUS | Status: AC
  Administered 2021-01-10: 120 mg via SUBCUTANEOUS
  Filled 2021-01-10: qty 1.7

## 2021-01-10 MED ORDER — SODIUM CHLORIDE 0.9 % IV SOLN
10.0000 mg | Freq: Once | INTRAVENOUS | Status: AC
  Administered 2021-01-10: 10 mg via INTRAVENOUS
  Filled 2021-01-10: qty 10

## 2021-01-10 MED ORDER — SODIUM CHLORIDE 0.9 % IV SOLN: Freq: Once | INTRAVENOUS | Status: AC

## 2021-01-10 MED ORDER — HEPARIN SOD (PORK) LOCK FLUSH 100 UNIT/ML IV SOLN
500.0000 [IU] | Freq: Once | INTRAVENOUS | Status: AC | PRN
  Administered 2021-01-10: 500 [IU]

## 2021-01-10 MED ORDER — SODIUM CHLORIDE 0.9 % IV SOLN
75.0000 mg/m2 | Freq: Once | INTRAVENOUS | Status: AC
  Administered 2021-01-10: 180 mg via INTRAVENOUS
  Filled 2021-01-10: qty 18

## 2021-01-10 MED ORDER — SODIUM CHLORIDE 0.9% FLUSH
10.0000 mL | Freq: Once | INTRAVENOUS | Status: AC
  Administered 2021-01-10: 10 mL via INTRAVENOUS

## 2021-01-10 NOTE — Progress Notes (Signed)
Hematology and Oncology Follow Up Visit  BAUER AUSBORN [id number] Feb 15, 1946 75 y.o. 01/10/2021 12:04 PM   Principle Diagnosis: 75 year old man with castration-resistant advanced prostate cancer with disease to the bone diagnosed in 2013.  He initially presented in 2003 with Gleason score 4 + 3 = 7 and PSA 6.45.   Prior Therapy:  1. He is status post a prostatectomy and lymph node dissection done on November 05, 2001.   2. S/P 6480 cGy of radiation therapy in 36 fractions between February and April 2005.   3. He developed advanced disease and was treated with Lupron in 2011.  His PSA nadir down to 4.11. PSA was up to 5.91 in December of 2012 with a testosterone level of 35. His staging workup did not reveal any bony metastasis.  4. Casodex was added to Lupron on 05/2011. This was discontinued in January 2016 due to progression of disease.  5. Zytiga 1000 mg daily started on 05/13/2014.  Therapy discontinued in December 2018 because of progression of disease. Bone scan of the time showed limited metastatic disease.  6.  He is status post radiation to pelvic lymph nodes between October 5 and December 11, 2018.  He received total of 40 Gray in 5 fractions.  7. Xtandi 160 mg daily started in December 2018.  Therapy discontinued in September 2021.  8. Xofigo monthly infusion started in October 2021.  He completed 6 months of therapy.  Current therapy:   Taxotere chemotherapy 75 mg per metered square every 3 weeks started in November 29, 2020.  He is here for cycle 3 of therapy.  Eligard 30 mg every 4 months.  Next Eligard will be given in February 2023.  Xgeva 120 mg given every 4 weeks started in October 2021.  He received Eligard every 6 weeks while on chemotherapy including on January 10, 2021.    Interim History: Mr. Ciolek returns today for a follow-up visit.  Since the last visit, he completed cycle 2 of chemotherapy without any major complaints.  He denies any nausea,  vomiting or abdominal pain.  He denies any worsening fatigue or neuropathy.  He denies any hospitalizations or illnesses.  Performance status quality of life remained excellent.          Medications: Updated on review. Current Outpatient Medications  Medication Sig Dispense Refill   ALPRAZolam (XANAX) 0.5 MG tablet Take 0.5 mg by mouth 3 (three) times daily as needed for sleep. (Patient not taking: No sig reported)     amLODipine (NORVASC) 5 MG tablet amlodipine 5 mg tablet  Take 1 tablet every day by oral route.     Ascorbic Acid (VITAMIN C) 100 MG CHEW Vitamin C  1 once a day     aspirin EC 81 MG tablet Take 81 mg by mouth daily.     Bergamot Oil OIL Bergamot  Take 1 or 2 capsules daily     calcium-vitamin D (OSCAL 500/200 D-3) 500-200 MG-UNIT tablet Take 1 tablet by mouth daily with breakfast. 1 tablet    Cholecalciferol (VITAMIN D3) 3000 units TABS cholecalciferol (vitamin D3) 1,000 unit tablet  1 tablet orally once a day     Leuprolide Acetate (LUPRON DEPOT IM) Inject into the muscle every 4 (four) months.     lidocaine-prilocaine (EMLA) cream Apply 1 application topically as needed. 30 g 0   Multiple Vitamins-Minerals (MULTIVITAMIN ADULT PO) multivitamin  1 tablet orally once a day     NON FORMULARY CBD oil 500 mg  Take  1 dropperful under tongue in the evening or at bedtime     Omega-3 Fatty Acids (FISH OIL) 1000 MG CAPS Fish Oil  2 tablet po daily     prochlorperazine (COMPAZINE) 10 MG tablet Take 1 tablet (10 mg total) by mouth every 6 (six) hours as needed for nausea or vomiting. 30 tablet 0   XTANDI 40 MG tablet TAKE 4 TABLETS (160 MG TOTAL) BY MOUTH DAILY. 120 tablet 0   No current facility-administered medications for this visit.    Allergies:  Allergies  Allergen Reactions   Penicillins Rash and Other (See Comments)    Has patient had a PCN reaction causing immediate rash, facial/tongue/throat swelling, SOB or lightheadedness with hypotension: YES Has patient  had a PCN reaction causing severe rash involving mucus membranes or skin necrosis: YES Has patient had a PCN reaction that required hospitalization: NO Has patient had a PCN reaction occurring within the last 10 years: NO       Physical Exam:        Blood pressure 125/71, pulse 74, temperature 98.2 F (36.8 C), temperature source Oral, resp. rate 17, height 6\' 3"  (1.905 m), weight 229 lb 11.2 oz (104.2 kg), SpO2 99 %.      ECOG: 1   General appearance: Alert, awake without any distress. Head: Atraumatic without abnormalities Oropharynx: Without any thrush or ulcers. Eyes: No scleral icterus. Lymph nodes: No lymphadenopathy noted in the cervical, supraclavicular, or axillary nodes Heart:regular rate and rhythm, without any murmurs or gallops.   Lung: Clear to auscultation without any rhonchi, wheezes or dullness to percussion. Abdomin: Soft, nontender without any shifting dullness or ascites. Musculoskeletal: No clubbing or cyanosis. Neurological: No motor or sensory deficits. Skin: No rashes or lesions.                  Lab Results: Lab Results  Component Value Date   WBC 5.9 01/10/2021   HGB 9.9 (L) 01/10/2021   HCT 30.7 (L) 01/10/2021   MCV 95.0 01/10/2021   PLT 139 (L) 01/10/2021     Chemistry      Component Value Date/Time   NA 138 01/10/2021 1120   NA 141 02/01/2017 0749   K 4.5 01/10/2021 1120   K 3.5 02/01/2017 0749   CL 107 01/10/2021 1120   CL 104 06/11/2012 0817   CO2 24 01/10/2021 1120   CO2 26 02/01/2017 0749   BUN 20 01/10/2021 1120   BUN 14.5 02/01/2017 0749   CREATININE 0.84 01/10/2021 1120   CREATININE 0.9 02/01/2017 0749      Component Value Date/Time   CALCIUM 8.5 (L) 01/10/2021 1120   CALCIUM 9.2 02/01/2017 0749   ALKPHOS 93 01/10/2021 1120   ALKPHOS 70 02/01/2017 0749   AST 16 01/10/2021 1120   AST 17 02/01/2017 0749   ALT 13 01/10/2021 1120   ALT 14 02/01/2017 0749   BILITOT 0.5 01/10/2021 1120   BILITOT  0.95 02/01/2017 0749       Results for Hartt, KATIE MOCH (MRN [id number]) as of 01/10/2021 11:40  Ref. Range 12/20/2020 08:27  Prostate Specific Ag, Serum Latest Ref Range: 0.0 - 4.0 ng/mL 899.0 (H)     Impression and Plan:  75 year old man with:  1.  Castration-resistant advanced prostate cancer with disease to the bone diagnosed in 2013.   He continues to tolerate Taxotere after 2 cycles of therapy without any complaints.  Risks and benefits of continuing this treatments were reiterated.  Complications include nausea, vomiting, myelosuppression  and neutropenia and worsening neuropathy reviewed.  His PSA did not decline after the first cycle of therapy but I anticipate improvement in the future.  The plan is to continue with the same dose and schedule.  Laboratory data from today reviewed.  He has adequate hematological parameters at this time.   2. Androgen deprivation.  This is to be continued indefinitely.  Next Eligard will be given in February 2023.   3.  Bone directed therapy: He will receive Xgeva today and repeated in 6 weeks.  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  4.  Growth factor support.  he is at risk of developing neutropenia and sepsis.  He will receive growth factor support after each cycle.  5.  Prognosis and goals of care: Therapy remains palliative although aggressive measures are warranted given his excellent performance status.  6. Follow up: In 3 weeks for the next cycle of therapy.  30 minutes were spent on this encounter.  The time was dedicated to reviewing laboratory data, disease status update and outlining future plan of care.  Zola Button, MD 11/15/202212:04 PM

## 2021-01-10 NOTE — Patient Instructions (Signed)
[facility]  Discharge Instructions: Thank you for choosing [facility] to provide your oncology and hematology care.   If you have a lab appointment with the [facility], please go directly to the [facility] and check in at the registration area.   Wear comfortable clothing and clothing appropriate for easy access to any Portacath or PICC line.   We strive to give you quality time with your provider. You may need to reschedule your appointment if you arrive late (15 or more minutes).  Arriving late affects you and other patients whose appointments are after yours.  Also, if you miss three or more appointments without notifying the office, you may be dismissed from the clinic at the provider's discretion.      For prescription refill requests, have your pharmacy contact our office and allow 72 hours for refills to be completed.    Today you received the following chemotherapy and/or immunotherapy agents taxotere      To help prevent nausea and vomiting after your treatment, we encourage you to take your nausea medication as directed.  BELOW ARE SYMPTOMS THAT SHOULD BE REPORTED IMMEDIATELY: *FEVER GREATER THAN 100.4 F (38 C) OR HIGHER *CHILLS OR SWEATING *NAUSEA AND VOMITING THAT IS NOT CONTROLLED WITH YOUR NAUSEA MEDICATION *UNUSUAL SHORTNESS OF BREATH *UNUSUAL BRUISING OR BLEEDING *URINARY PROBLEMS (pain or burning when urinating, or frequent urination) *BOWEL PROBLEMS (unusual diarrhea, constipation, pain near the anus) TENDERNESS IN MOUTH AND THROAT WITH OR WITHOUT PRESENCE OF ULCERS (sore throat, sores in mouth, or a toothache) UNUSUAL RASH, SWELLING OR PAIN  UNUSUAL VAGINAL DISCHARGE OR ITCHING   Items with * indicate a potential emergency and should be followed up as soon as possible or go to the Emergency Department if any problems should occur.  Please show the CHEMOTHERAPY ALERT CARD or IMMUNOTHERAPY ALERT CARD at check-in to  the Emergency Department and triage nurse.  Should you have questions after your visit or need to cancel or reschedule your appointment, please contact [facility]  Dept: [phone]  and follow the prompts.  Office hours are 8:00 a.m. to 4:30 p.m. Monday - Friday. Please note that voicemails left after 4:00 p.m. may not be returned until the following business day.  We are closed weekends and major holidays. You have access to a nurse at all times for urgent questions. Please call the main number to the clinic Dept: [phone] and follow the prompts.   For any non-urgent questions, you may also contact your provider using MyChart. We now offer e-Visits for anyone 36 and older to request care online for non-urgent symptoms. For details visit [email or web address].   Also download the MyChart app! Go to the app store, search "MyChart", open the app, select [facility], and log in with your MyChart username and password.  Due to Covid, a mask is required upon entering the hospital/clinic. If you do not have a mask, one will be given to you upon arrival. For doctor visits, patients may have 1 support person aged 74 or older with them. For treatment visits, patients cannot have anyone with them due to current Covid guidelines and our immunocompromised population.

## 2021-01-10 NOTE — Addendum Note (Signed)
Addended by: [name] on: 01/10/2021 01:17 PM   Modules accepted: Orders

## 2021-01-11 ENCOUNTER — Telehealth: Payer: Self-pay | Admitting: *Deleted

## 2021-01-11 LAB — PROSTATE-SPECIFIC AG, SERUM (LABCORP): Prostate Specific Ag, Serum: 936 ng/mL — ABNORMAL HIGH (ref 0.0–4.0)

## 2021-01-11 NOTE — Telephone Encounter (Signed)
-----   Message from [name], MD sent at 01/11/2021  8:26 AM EST ----- Please let him know his PSA is up but I anticipate it will go down in the future like we discussed.

## 2021-01-11 NOTE — Telephone Encounter (Signed)
PC to patient, no answer, left VM regarding PSA results as below.  Instructed patient to call this office with any questions/concerns.

## 2021-01-12 ENCOUNTER — Inpatient Hospital Stay: Payer: Medicare Other

## 2021-01-12 ENCOUNTER — Other Ambulatory Visit: Payer: Self-pay

## 2021-01-12 VITALS — BP 136/84 | HR 82 | Temp 97.9°F | Resp 17

## 2021-01-12 DIAGNOSIS — C61 Malignant neoplasm of prostate: Secondary | ICD-10-CM | POA: Diagnosis not present

## 2021-01-12 MED ORDER — PEGFILGRASTIM-CBQV 6 MG/0.6ML ~~LOC~~ SOSY
6.0000 mg | PREFILLED_SYRINGE | Freq: Once | SUBCUTANEOUS | Status: AC
  Administered 2021-01-12: 13:00:00 6 mg via SUBCUTANEOUS
  Filled 2021-01-12: qty 0.6

## 2021-01-13 ENCOUNTER — Telehealth: Payer: Self-pay | Admitting: Oncology

## 2021-01-13 NOTE — Telephone Encounter (Signed)
Scheduled per los, patient has been called and notified of upcoming appointments. 

## 2021-01-30 MED FILL — Dexamethasone Sodium Phosphate Inj 100 MG/10ML: INTRAMUSCULAR | Qty: 1 | Status: AC

## 2021-01-31 ENCOUNTER — Inpatient Hospital Stay: Payer: Medicare Other | Admitting: Oncology

## 2021-01-31 ENCOUNTER — Other Ambulatory Visit: Payer: Self-pay

## 2021-01-31 ENCOUNTER — Inpatient Hospital Stay: Payer: Medicare Other

## 2021-01-31 ENCOUNTER — Inpatient Hospital Stay: Payer: Medicare Other | Attending: Oncology

## 2021-01-31 VITALS — BP 130/84 | HR 80 | Temp 96.9°F | Resp 18 | Wt 224.4 lb

## 2021-01-31 DIAGNOSIS — Z23 Encounter for immunization: Secondary | ICD-10-CM | POA: Insufficient documentation

## 2021-01-31 DIAGNOSIS — Z95828 Presence of other vascular implants and grafts: Secondary | ICD-10-CM

## 2021-01-31 DIAGNOSIS — Z9221 Personal history of antineoplastic chemotherapy: Secondary | ICD-10-CM | POA: Insufficient documentation

## 2021-01-31 DIAGNOSIS — Z192 Hormone resistant malignancy status: Secondary | ICD-10-CM | POA: Diagnosis not present

## 2021-01-31 DIAGNOSIS — C61 Malignant neoplasm of prostate: Secondary | ICD-10-CM | POA: Insufficient documentation

## 2021-01-31 DIAGNOSIS — Z923 Personal history of irradiation: Secondary | ICD-10-CM | POA: Insufficient documentation

## 2021-01-31 DIAGNOSIS — C7951 Secondary malignant neoplasm of bone: Secondary | ICD-10-CM | POA: Diagnosis not present

## 2021-01-31 LAB — CBC WITH DIFFERENTIAL (CANCER CENTER ONLY)
Abs Immature Granulocytes: 0.04 10*3/uL (ref 0.00–0.07)
Basophils Absolute: 0.1 10*3/uL (ref 0.0–0.1)
Basophils Relative: 1 %
Eosinophils Absolute: 0.1 10*3/uL (ref 0.0–0.5)
Eosinophils Relative: 1 %
HCT: 27.8 % — ABNORMAL LOW (ref 39.0–52.0)
Hemoglobin: 8.9 g/dL — ABNORMAL LOW (ref 13.0–17.0)
Immature Granulocytes: 1 %
Lymphocytes Relative: 14 %
Lymphs Abs: 0.8 10*3/uL (ref 0.7–4.0)
MCH: 30.6 pg (ref 26.0–34.0)
MCHC: 32 g/dL (ref 30.0–36.0)
MCV: 95.5 fL (ref 80.0–100.0)
Monocytes Absolute: 0.5 10*3/uL (ref 0.1–1.0)
Monocytes Relative: 8 %
Neutro Abs: 4.7 10*3/uL (ref 1.7–7.7)
Neutrophils Relative %: 75 %
Platelet Count: 116 10*3/uL — ABNORMAL LOW (ref 150–400)
RBC: 2.91 MIL/uL — ABNORMAL LOW (ref 4.22–5.81)
RDW: 17.2 % — ABNORMAL HIGH (ref 11.5–15.5)
WBC Count: 6.2 10*3/uL (ref 4.0–10.5)
nRBC: 0 % (ref 0.0–0.2)

## 2021-01-31 LAB — CMP (CANCER CENTER ONLY)
ALT: 9 U/L (ref 0–44)
AST: 13 U/L — ABNORMAL LOW (ref 15–41)
Albumin: 2.9 g/dL — ABNORMAL LOW (ref 3.5–5.0)
Alkaline Phosphatase: 77 U/L (ref 38–126)
Anion gap: 9 (ref 5–15)
BUN: 20 mg/dL (ref 8–23)
CO2: 23 mmol/L (ref 22–32)
Calcium: 8 mg/dL — ABNORMAL LOW (ref 8.9–10.3)
Chloride: 107 mmol/L (ref 98–111)
Creatinine: 1.09 mg/dL (ref 0.61–1.24)
GFR, Estimated: >60 mL/min (ref >60)
Glucose, Bld: 142 mg/dL — ABNORMAL HIGH (ref 70–99)
Potassium: 4.4 mmol/L (ref 3.5–5.1)
Sodium: 139 mmol/L (ref 135–145)
Total Bilirubin: 0.4 mg/dL (ref 0.3–1.2)
Total Protein: 6.1 g/dL — ABNORMAL LOW (ref 6.5–8.1)

## 2021-01-31 MED ORDER — INFLUENZA VAC A&B SA ADJ QUAD 0.5 ML IM PRSY
0.5000 mL | PREFILLED_SYRINGE | Freq: Once | INTRAMUSCULAR | Status: AC
  Administered 2021-01-31: 0.5 mL via INTRAMUSCULAR
  Filled 2021-01-31: qty 0.5

## 2021-01-31 MED ORDER — SODIUM CHLORIDE 0.9 % IV SOLN
75.0000 mg/m2 | Freq: Once | INTRAVENOUS | Status: AC
  Administered 2021-01-31: 180 mg via INTRAVENOUS
  Filled 2021-01-31: qty 18

## 2021-01-31 MED ORDER — SODIUM CHLORIDE 0.9% FLUSH
10.0000 mL | INTRAVENOUS | Status: DC | PRN
  Administered 2021-01-31: 10 mL

## 2021-01-31 MED ORDER — SODIUM CHLORIDE 0.9 % IV SOLN: Freq: Once | INTRAVENOUS | Status: AC

## 2021-01-31 MED ORDER — SODIUM CHLORIDE 0.9% FLUSH
10.0000 mL | INTRAVENOUS | Status: AC | PRN
  Administered 2021-01-31: 10 mL

## 2021-01-31 MED ORDER — SODIUM CHLORIDE 0.9 % IV SOLN
10.0000 mg | Freq: Once | INTRAVENOUS | Status: AC
  Administered 2021-01-31: 10 mg via INTRAVENOUS
  Filled 2021-01-31: qty 10

## 2021-01-31 MED ORDER — HEPARIN SOD (PORK) LOCK FLUSH 100 UNIT/ML IV SOLN
500.0000 [IU] | Freq: Once | INTRAVENOUS | Status: AC | PRN
  Administered 2021-01-31: 500 [IU]

## 2021-01-31 NOTE — Progress Notes (Signed)
Hematology and Oncology Follow Up Visit  [name] [id number] 1945/11/21 75 y.o. 01/31/2021 7:54 AM   Principle Diagnosis: 76 year old man with advanced prostate cancer diagnosed in 2013.  He has castration-resistant with disease to the bone after presenting with Gleason score 4 + 3 = 7 and PSA 6.45 in 2003.   Prior Therapy:  1. He is status post a prostatectomy and lymph node dissection done on November 05, 2001.   2. S/P 6480 cGy of radiation therapy in 36 fractions between February and April 2005.   3. He developed advanced disease and was treated with Lupron in 2011.  His PSA nadir down to 4.11. PSA was up to 5.91 in December of 2012 with a testosterone level of 35. His staging workup did not reveal any bony metastasis.  4. Casodex was added to Lupron on 05/2011. This was discontinued in January 2016 due to progression of disease.  5. Zytiga 1000 mg daily started on 05/13/2014.  Therapy discontinued in December 2018 because of progression of disease. Bone scan of the time showed limited metastatic disease.  6.  He is status post radiation to pelvic lymph nodes between October 5 and December 11, 2018.  He received total of 40 Gray in 5 fractions.  7. Xtandi 160 mg daily started in December 2018.  Therapy discontinued in September 2021.  8. Xofigo monthly infusion started in October 2021.  He completed 6 months of therapy.  Current therapy:   Taxotere chemotherapy 75 mg per metered square every 3 weeks started in November 29, 2020.  He is here for cycle 4 of therapy.  Eligard 30 mg every 4 months.  Next Eligard will be given in February 2023.  Xgeva 120 mg given every 4 weeks started in October 2021.  He will continue to receive that every 6 weeks with the next injection scheduled with cycle 5 of chemotherapy.    Interim History: [name] presents today for return evaluation.  Since the last visit, he reports no major changes in his health.  He denies any recent  hospitalizations or illnesses.  He denies any nausea, vomiting or abdominal pain.  He denies any worsening neuropathy or fatigue.  He denies any dyspnea on exertion or decline in his performance status.  He does report lack of energy after cycle of chemotherapy.          Medications: Reviewed without changes. Current Outpatient Medications  Medication Sig Dispense Refill   ALPRAZolam (XANAX) 0.5 MG tablet Take 0.5 mg by mouth 3 (three) times daily as needed for sleep. (Patient not taking: No sig reported)     amLODipine (NORVASC) 5 MG tablet amlodipine 5 mg tablet  Take 1 tablet every day by oral route.     Ascorbic Acid (VITAMIN C) 100 MG CHEW Vitamin C  1 once a day     aspirin EC 81 MG tablet Take 81 mg by mouth daily.     Bergamot Oil OIL Bergamot  Take 1 or 2 capsules daily     calcium-vitamin D (OSCAL 500/200 D-3) 500-200 MG-UNIT tablet Take 1 tablet by mouth daily with breakfast. 1 tablet    Cholecalciferol (VITAMIN D3) 3000 units TABS cholecalciferol (vitamin D3) 1,000 unit tablet  1 tablet orally once a day     Leuprolide Acetate (LUPRON DEPOT IM) Inject into the muscle every 4 (four) months.     lidocaine-prilocaine (EMLA) cream Apply 1 application topically as needed. 30 g 0   Multiple Vitamins-Minerals (MULTIVITAMIN ADULT PO)  multivitamin  1 tablet orally once a day     NON FORMULARY CBD oil 500 mg  Take 1 dropperful under tongue in the evening or at bedtime     Omega-3 Fatty Acids (FISH OIL) 1000 MG CAPS Fish Oil  2 tablet po daily     prochlorperazine (COMPAZINE) 10 MG tablet Take 1 tablet (10 mg total) by mouth every 6 (six) hours as needed for nausea or vomiting. 30 tablet 0   XTANDI 40 MG tablet TAKE 4 TABLETS (160 MG TOTAL) BY MOUTH DAILY. 120 tablet 0   No current facility-administered medications for this visit.    Allergies:  Allergies  Allergen Reactions   Penicillins Rash and Other (See Comments)    Has patient had a PCN reaction causing immediate  rash, facial/tongue/throat swelling, SOB or lightheadedness with hypotension: YES Has patient had a PCN reaction causing severe rash involving mucus membranes or skin necrosis: YES Has patient had a PCN reaction that required hospitalization: NO Has patient had a PCN reaction occurring within the last 10 years: NO       Physical Exam:    Blood pressure 130/84, pulse 80, temperature (!) 96.9 F (36.1 C), temperature source Tympanic, resp. rate 18, weight 224 lb 6.4 oz (101.8 kg), SpO2 99 %.           ECOG: 1    General appearance: Comfortable appearing without any discomfort Head: Normocephalic without any trauma Oropharynx: Mucous membranes are moist and pink without any thrush or ulcers. Eyes: Pupils are equal and round reactive to light. Lymph nodes: No cervical, supraclavicular, inguinal or axillary lymphadenopathy.   Heart:regular rate and rhythm.  S1 and S2 without leg edema. Lung: Clear without any rhonchi or wheezes.  No dullness to percussion. Abdomin: Soft, nontender, nondistended with good bowel sounds.  No hepatosplenomegaly. Musculoskeletal: No joint deformity or effusion.  Full range of motion noted. Neurological: No deficits noted on motor, sensory and deep tendon reflex exam. Skin: No petechial rash or dryness.  Appeared moist.                    Lab Results: Lab Results  Component Value Date   WBC 5.9 01/10/2021   HGB 9.9 (L) 01/10/2021   HCT 30.7 (L) 01/10/2021   MCV 95.0 01/10/2021   PLT 139 (L) 01/10/2021     Chemistry      Component Value Date/Time   NA 138 01/10/2021 1120   NA 141 02/01/2017 0749   K 4.5 01/10/2021 1120   K 3.5 02/01/2017 0749   CL 107 01/10/2021 1120   CL 104 06/11/2012 0817   CO2 24 01/10/2021 1120   CO2 26 02/01/2017 0749   BUN 20 01/10/2021 1120   BUN 14.5 02/01/2017 0749   CREATININE 0.84 01/10/2021 1120   CREATININE 0.9 02/01/2017 0749      Component Value Date/Time   CALCIUM 8.5 (L)  01/10/2021 1120   CALCIUM 9.2 02/01/2017 0749   ALKPHOS 93 01/10/2021 1120   ALKPHOS 70 02/01/2017 0749   AST 16 01/10/2021 1120   AST 17 02/01/2017 0749   ALT 13 01/10/2021 1120   ALT 14 02/01/2017 0749   BILITOT 0.5 01/10/2021 1120   BILITOT 0.95 02/01/2017 0749        Latest Reference Range & Units 11/29/20 07:51 12/20/20 08:27 01/10/21 11:20  Prostate Specific Ag, Serum 0.0 - 4.0 ng/mL 777.0 (H) 899.0 (H) 936.0 (H)  (H): Data is abnormally high  Impression and Plan:  75 year old man with:  1.  Advanced prostate cancer with disease to the bone diagnosed in 2013.  He has castration-resistant disease at this time.  Risks and benefits of continuing Taxotere chemotherapy were discussed at this time.  Complications that include nausea, vomiting, myelosuppression, neutropenia and worsening neuropathy were reiterated.  His PSA is pending from today but has not showed any decline yet.  We will continue to monitor closely and consider different salvage therapy if no improvement noted.  Pluvecto would be considered subsequently.  He is agreeable to proceed at this time.   2. Androgen deprivation.  Next Eligard will be in February, 2023.  Complication clinic weight gain, hot flashes were discussed.  Treatment is to continue indefinitely.   3.  Bone directed therapy: He will receive Xgeva with the next cycle of chemotherapy.  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  4.  Growth factor support.  He will continue to receive growth factor support after each cycle of therapy.  5.  Prognosis and goals of care: Aggressive measures are warranted given his excellent performance status.  His disease is incurable however.  6. Follow up: He will return in 3 weeks for the next cycle of therapy.  30 minutes were dedicated to this visit.  Time was spent on reviewing laboratory data, disease status update and addressing complications noted to cancer and cancer  therapy.  Zola Button, MD 12/6/20227:54 AM

## 2021-01-31 NOTE — Patient Instructions (Addendum)
[facility]  Discharge Instructions: Thank you for choosing [facility] to provide your oncology and hematology care.   If you have a lab appointment with the [facility], please go directly to the [facility] and check in at the registration area.   Wear comfortable clothing and clothing appropriate for easy access to any Portacath or PICC line.   We strive to give you quality time with your provider. You may need to reschedule your appointment if you arrive late (15 or more minutes).  Arriving late affects you and other patients whose appointments are after yours.  Also, if you miss three or more appointments without notifying the office, you may be dismissed from the clinic at the provider's discretion.      For prescription refill requests, have your pharmacy contact our office and allow 72 hours for refills to be completed.    Today you received the following chemotherapy and/or immunotherapy agent: Docetaxel (Taxotere).   To help prevent nausea and vomiting after your treatment, we encourage you to take your nausea medication as directed.  BELOW ARE SYMPTOMS THAT SHOULD BE REPORTED IMMEDIATELY: *FEVER GREATER THAN 100.4 F (38 C) OR HIGHER *CHILLS OR SWEATING *NAUSEA AND VOMITING THAT IS NOT CONTROLLED WITH YOUR NAUSEA MEDICATION *UNUSUAL SHORTNESS OF BREATH *UNUSUAL BRUISING OR BLEEDING *URINARY PROBLEMS (pain or burning when urinating, or frequent urination) *BOWEL PROBLEMS (unusual diarrhea, constipation, pain near the anus) TENDERNESS IN MOUTH AND THROAT WITH OR WITHOUT PRESENCE OF ULCERS (sore throat, sores in mouth, or a toothache) UNUSUAL RASH, SWELLING OR PAIN  UNUSUAL VAGINAL DISCHARGE OR ITCHING   Items with * indicate a potential emergency and should be followed up as soon as possible or go to the Emergency Department if any problems should occur.  Please show the CHEMOTHERAPY ALERT CARD or IMMUNOTHERAPY ALERT CARD at  check-in to the Emergency Department and triage nurse.  Should you have questions after your visit or need to cancel or reschedule your appointment, please contact [facility]  Dept: [phone]  and follow the prompts.  Office hours are 8:00 a.m. to 4:30 p.m. Monday - Friday. Please note that voicemails left after 4:00 p.m. may not be returned until the following business day.  We are closed weekends and major holidays. You have access to a nurse at all times for urgent questions. Please call the main number to the clinic Dept: [phone] and follow the prompts.   For any non-urgent questions, you may also contact your provider using MyChart. We now offer e-Visits for anyone 54 and older to request care online for non-urgent symptoms. For details visit [email or web address].   Also download the MyChart app! Go to the app store, search "MyChart", open the app, select [facility], and log in with your MyChart username and password.  Due to Covid, a mask is required upon entering the hospital/clinic. If you do not have a mask, one will be given to you upon arrival. For doctor visits, patients may have 1 support person aged 89 or older with them. For treatment visits, patients cannot have anyone with them due to current Covid guidelines and our immunocompromised population.

## 2021-02-01 ENCOUNTER — Telehealth: Payer: Self-pay | Admitting: *Deleted

## 2021-02-01 LAB — PROSTATE-SPECIFIC AG, SERUM (LABCORP): Prostate Specific Ag, Serum: 896 ng/mL — ABNORMAL HIGH (ref 0.0–4.0)

## 2021-02-01 NOTE — Telephone Encounter (Signed)
PC to patient, informed him of Dr. Shadad's message below.  Patient verbalizes understanding. °

## 2021-02-01 NOTE — Telephone Encounter (Signed)
-----   Message from [name], MD sent at 02/01/2021  8:58 AM EST ----- Please let him know his PSA is down

## 2021-02-02 ENCOUNTER — Other Ambulatory Visit: Payer: Self-pay

## 2021-02-02 ENCOUNTER — Inpatient Hospital Stay: Payer: Medicare Other

## 2021-02-02 VITALS — BP 121/68 | HR 79 | Temp 98.5°F | Resp 20

## 2021-02-02 DIAGNOSIS — C61 Malignant neoplasm of prostate: Secondary | ICD-10-CM

## 2021-02-02 MED ORDER — PEGFILGRASTIM-CBQV 6 MG/0.6ML ~~LOC~~ SOSY
6.0000 mg | PREFILLED_SYRINGE | Freq: Once | SUBCUTANEOUS | Status: AC
  Administered 2021-02-02: 6 mg via SUBCUTANEOUS
  Filled 2021-02-02: qty 0.6

## 2021-02-02 NOTE — Patient Instructions (Signed)

## 2021-02-14 ENCOUNTER — Telehealth: Payer: Self-pay | Admitting: *Deleted

## 2021-02-14 NOTE — Telephone Encounter (Signed)
PC to patient, informed him Dr. Alen Blew does not think an iron supplement will be helpful for his fatigue, however patient may try taking the iron if he wishes.  Patient verbalizes understanding.

## 2021-02-14 NOTE — Telephone Encounter (Signed)
-----   Message from [name], MD sent at 02/14/2021  2:50 PM EST ----- I do not think iron will help him. ----- Message ----- From: Rolene Course, RN Sent: 02/14/2021   2:44 PM EST To: [name], MD  This patient called today & said his PCP has prescribed him an iron supplement to help with fatigue from his treatments.  He is asking if you recommend this as well.  Please advise.  Thanks, Bethena Roys

## 2021-02-21 ENCOUNTER — Inpatient Hospital Stay: Payer: Medicare Other

## 2021-02-21 ENCOUNTER — Other Ambulatory Visit: Payer: Self-pay

## 2021-02-21 ENCOUNTER — Inpatient Hospital Stay: Payer: Medicare Other | Admitting: Oncology

## 2021-02-21 VITALS — BP 122/67 | HR 83 | Temp 97.7°F | Resp 17 | Ht 75.0 in | Wt 229.6 lb

## 2021-02-21 DIAGNOSIS — Z95828 Presence of other vascular implants and grafts: Secondary | ICD-10-CM

## 2021-02-21 DIAGNOSIS — C61 Malignant neoplasm of prostate: Secondary | ICD-10-CM

## 2021-02-21 LAB — CBC WITH DIFFERENTIAL (CANCER CENTER ONLY)
Abs Immature Granulocytes: 0.05 10*3/uL (ref 0.00–0.07)
Basophils Absolute: 0.1 10*3/uL (ref 0.0–0.1)
Basophils Relative: 1 %
Eosinophils Absolute: 0 10*3/uL (ref 0.0–0.5)
Eosinophils Relative: 1 %
HCT: 28.9 % — ABNORMAL LOW (ref 39.0–52.0)
Hemoglobin: 9.1 g/dL — ABNORMAL LOW (ref 13.0–17.0)
Immature Granulocytes: 1 %
Lymphocytes Relative: 21 %
Lymphs Abs: 1.2 10*3/uL (ref 0.7–4.0)
MCH: 31.1 pg (ref 26.0–34.0)
MCHC: 31.5 g/dL (ref 30.0–36.0)
MCV: 98.6 fL (ref 80.0–100.0)
Monocytes Absolute: 0.6 10*3/uL (ref 0.1–1.0)
Monocytes Relative: 10 %
Neutro Abs: 3.6 10*3/uL (ref 1.7–7.7)
Neutrophils Relative %: 66 %
Platelet Count: 110 10*3/uL — ABNORMAL LOW (ref 150–400)
RBC: 2.93 MIL/uL — ABNORMAL LOW (ref 4.22–5.81)
RDW: 18.7 % — ABNORMAL HIGH (ref 11.5–15.5)
WBC Count: 5.5 10*3/uL (ref 4.0–10.5)
nRBC: 0 % (ref 0.0–0.2)

## 2021-02-21 LAB — CMP (CANCER CENTER ONLY)
ALT: 17 U/L (ref 0–44)
AST: 18 U/L (ref 15–41)
Albumin: 3.3 g/dL — ABNORMAL LOW (ref 3.5–5.0)
Alkaline Phosphatase: 63 U/L (ref 38–126)
Anion gap: 5 (ref 5–15)
BUN: 19 mg/dL (ref 8–23)
CO2: 26 mmol/L (ref 22–32)
Calcium: 8.1 mg/dL — ABNORMAL LOW (ref 8.9–10.3)
Chloride: 108 mmol/L (ref 98–111)
Creatinine: 0.85 mg/dL (ref 0.61–1.24)
GFR, Estimated: >60 mL/min
Glucose, Bld: 128 mg/dL — ABNORMAL HIGH (ref 70–99)
Potassium: 4.2 mmol/L (ref 3.5–5.1)
Sodium: 139 mmol/L (ref 135–145)
Total Bilirubin: 0.5 mg/dL (ref 0.3–1.2)
Total Protein: 5.8 g/dL — ABNORMAL LOW (ref 6.5–8.1)

## 2021-02-21 MED ORDER — SODIUM CHLORIDE 0.9 % IV SOLN
10.0000 mg | Freq: Once | INTRAVENOUS | Status: AC
  Administered 2021-02-21: 10:00:00 10 mg via INTRAVENOUS
  Filled 2021-02-21: qty 10

## 2021-02-21 MED ORDER — SODIUM CHLORIDE 0.9 % IV SOLN: Freq: Once | INTRAVENOUS | Status: AC

## 2021-02-21 MED ORDER — SODIUM CHLORIDE 0.9% FLUSH
10.0000 mL | INTRAVENOUS | Status: DC | PRN
  Administered 2021-02-21: 12:00:00 10 mL

## 2021-02-21 MED ORDER — SODIUM CHLORIDE 0.9% FLUSH
10.0000 mL | INTRAVENOUS | Status: DC | PRN
  Administered 2021-02-21: 08:00:00 10 mL via INTRAVENOUS

## 2021-02-21 MED ORDER — SODIUM CHLORIDE 0.9 % IV SOLN
75.0000 mg/m2 | Freq: Once | INTRAVENOUS | Status: AC
  Administered 2021-02-21: 11:00:00 180 mg via INTRAVENOUS
  Filled 2021-02-21: qty 18

## 2021-02-21 MED ORDER — DENOSUMAB 120 MG/1.7ML ~~LOC~~ SOLN
120.0000 mg | Freq: Once | SUBCUTANEOUS | Status: AC
  Administered 2021-02-21: 10:00:00 120 mg via SUBCUTANEOUS
  Filled 2021-02-21: qty 1.7

## 2021-02-21 MED ORDER — HEPARIN SOD (PORK) LOCK FLUSH 100 UNIT/ML IV SOLN
500.0000 [IU] | Freq: Once | INTRAVENOUS | Status: AC | PRN
  Administered 2021-02-21: 12:00:00 500 [IU]

## 2021-02-21 NOTE — Progress Notes (Signed)
Hematology and Oncology Follow Up Visit  [name] [id number] 16-Jun-1945 75 y.o. 02/21/2021 8:15 AM   Principle Diagnosis: 75 year old man with castration-resistant advanced prostate cancer diagnosed in 2013.  He presented with localized disease in 2003 and found to have Gleason score 4 + 3 = 7 and PSA 6.45.   Prior Therapy:  1. He is status post a prostatectomy and lymph node dissection done on November 05, 2001.   2. S/P 6480 cGy of radiation therapy in 36 fractions between February and April 2005.   3. He developed advanced disease and was treated with Lupron in 2011.  His PSA nadir down to 4.11. PSA was up to 5.91 in December of 2012 with a testosterone level of 35. His staging workup did not reveal any bony metastasis.  4. Casodex was added to Lupron on 05/2011. This was discontinued in January 2016 due to progression of disease.  5. Zytiga 1000 mg daily started on 05/13/2014.  Therapy discontinued in December 2018 because of progression of disease. Bone scan of the time showed limited metastatic disease.  6.  He is status post radiation to pelvic lymph nodes between October 5 and December 11, 2018.  He received total of 40 Gray in 5 fractions.  7. Xtandi 160 mg daily started in December 2018.  Therapy discontinued in September 2021.  8. Xofigo monthly infusion started in October 2021.  He completed 6 months of therapy.  Current therapy:   Taxotere chemotherapy 75 mg per metered square every 3 weeks started in November 29, 2020.  He is here for cycle 5 of therapy.  Eligard 30 mg every 4 months.  Next Eligard will be given in February 2023.  Xgeva 120 mg given every 4 weeks started in October 2021.  He received the next injection today.    Interim History: [name] is here for repeat follow-up visit.  Since the last visit, he has reports no major changes in his health.  He tolerated the last therapy without any major issues.  He denies any nausea, vomiting or  abdominal pain.  He did report some mild fatigue but no shortness of breath or difficulty breathing.  He denies any hospitalizations or illnesses.  He denies any worsening neuropathy.          Medications: Updated on review. Current Outpatient Medications  Medication Sig Dispense Refill   ALPRAZolam (XANAX) 0.5 MG tablet Take 0.5 mg by mouth 3 (three) times daily as needed for sleep. (Patient not taking: No sig reported)     amLODipine (NORVASC) 5 MG tablet amlodipine 5 mg tablet  Take 1 tablet every day by oral route.     Ascorbic Acid (VITAMIN C) 100 MG CHEW Vitamin C  1 once a day     aspirin EC 81 MG tablet Take 81 mg by mouth daily.     Bergamot Oil OIL Bergamot  Take 1 or 2 capsules daily     calcium-vitamin D (OSCAL 500/200 D-3) 500-200 MG-UNIT tablet Take 1 tablet by mouth daily with breakfast. 1 tablet    Cholecalciferol (VITAMIN D3) 3000 units TABS cholecalciferol (vitamin D3) 1,000 unit tablet  1 tablet orally once a day     Leuprolide Acetate (LUPRON DEPOT IM) Inject into the muscle every 4 (four) months.     lidocaine-prilocaine (EMLA) cream Apply 1 application topically as needed. 30 g 0   Multiple Vitamins-Minerals (MULTIVITAMIN ADULT PO) multivitamin  1 tablet orally once a day     NON FORMULARY  CBD oil 500 mg  Take 1 dropperful under tongue in the evening or at bedtime     Omega-3 Fatty Acids (FISH OIL) 1000 MG CAPS Fish Oil  2 tablet po daily     prochlorperazine (COMPAZINE) 10 MG tablet Take 1 tablet (10 mg total) by mouth every 6 (six) hours as needed for nausea or vomiting. 30 tablet 0   XTANDI 40 MG tablet TAKE 4 TABLETS (160 MG TOTAL) BY MOUTH DAILY. 120 tablet 0   No current facility-administered medications for this visit.   Facility-Administered Medications Ordered in Other Visits  Medication Dose Route Frequency Provider Last Rate Last Admin   sodium chloride flush (NS) 0.9 % injection 10 mL  10 mL Intravenous PRN [name], MD   10 mL at  02/21/21 7371    Allergies:  Allergies  Allergen Reactions   Penicillins Rash and Other (See Comments)    Has patient had a PCN reaction causing immediate rash, facial/tongue/throat swelling, SOB or lightheadedness with hypotension: YES Has patient had a PCN reaction causing severe rash involving mucus membranes or skin necrosis: YES Has patient had a PCN reaction that required hospitalization: NO Has patient had a PCN reaction occurring within the last 10 years: NO       Physical Exam:         Blood pressure 122/67, pulse 83, temperature 97.7 F (36.5 C), temperature source Temporal, resp. rate 17, height 6\' 3"  (1.905 m), weight 229 lb 9.6 oz (104.1 kg), SpO2 100 %.       ECOG: 1   General appearance: Alert, awake without any distress. Head: Atraumatic without abnormalities Oropharynx: Without any thrush or ulcers. Eyes: No scleral icterus. Lymph nodes: No lymphadenopathy noted in the cervical, supraclavicular, or axillary nodes Heart:regular rate and rhythm, without any murmurs or gallops.   Lung: Clear to auscultation without any rhonchi, wheezes or dullness to percussion. Abdomin: Soft, nontender without any shifting dullness or ascites. Musculoskeletal: No clubbing or cyanosis. Neurological: No motor or sensory deficits. Skin: No rashes or lesions.                   Lab Results: Lab Results  Component Value Date   WBC 6.2 01/31/2021   HGB 8.9 (L) 01/31/2021   HCT 27.8 (L) 01/31/2021   MCV 95.5 01/31/2021   PLT 116 (L) 01/31/2021     Chemistry      Component Value Date/Time   NA 139 01/31/2021 0821   NA 141 02/01/2017 0749   K 4.4 01/31/2021 0821   K 3.5 02/01/2017 0749   CL 107 01/31/2021 0821   CL 104 06/11/2012 0817   CO2 23 01/31/2021 0821   CO2 26 02/01/2017 0749   BUN 20 01/31/2021 0821   BUN 14.5 02/01/2017 0749   CREATININE 1.09 01/31/2021 0821   CREATININE 0.9 02/01/2017 0749      Component Value Date/Time    CALCIUM 8.0 (L) 01/31/2021 0821   CALCIUM 9.2 02/01/2017 0749   ALKPHOS 77 01/31/2021 0821   ALKPHOS 70 02/01/2017 0749   AST 13 (L) 01/31/2021 0821   AST 17 02/01/2017 0749   ALT 9 01/31/2021 0821   ALT 14 02/01/2017 0749   BILITOT 0.4 01/31/2021 0821   BILITOT 0.95 02/01/2017 0749       Latest Reference Range & Units 12/20/20 08:27 01/10/21 11:20 01/31/21 08:21  Prostate Specific Ag, Serum 0.0 - 4.0 ng/mL 899.0 (H) 936.0 (H) 896.0 (H)  (H): Data is abnormally high  Impression and Plan:  75 year old man with:  1.  Castration-resistant advanced prostate cancer with disease to the bone diagnosed in 2013.    He is currently on Taxotere chemotherapy without any major complications.  His PSA did show mild decline but overall reasonable tolerance.  Risks and benefits of continuing this treatment were discussed.  Complications that include nausea, vomiting, myelosuppression, neutropenia and possible sepsis were reiterated.  He is agreeable to proceed with different salvage therapy options including lutetium 117 and Jevtana were reviewed.   2. Androgen deprivation.  He is up-to-date at this time and Eligard will be repeated in February of next year.  3.  Bone directed therapy: He will receive Xgeva today and repeated in 6 weeks.  Complication clinic osteonecrosis of the jaw and hypocalcemia were reviewed.  4.  Growth factor support.  He is at risk of neutropenia and possible sepsis.  He will receive growth factor support after each cycle.  5.  Prognosis and goals of care: Therapy remains palliative though aggressive measures are warranted.  6. Follow up: In 3 weeks for the next cycle of therapy.  30 minutes were spent on this encounter.  Time was dedicated to reviewing treatment choices, complications related to treatment and future plan of care review.  Zola Button, MD 12/27/20228:15 AM

## 2021-02-21 NOTE — Patient Instructions (Signed)
[facility]   ?Discharge Instructions: ?Thank you for choosing [facility] to provide your oncology and hematology care.  ? ?If you have a lab appointment with the Cancer Center, please go directly to the Cancer Center and check in at the registration area. ?  ?Wear comfortable clothing and clothing appropriate for easy access to any Portacath or PICC line.  ? ?We strive to give you quality time with your provider. You may need to reschedule your appointment if you arrive late (15 or more minutes).  Arriving late affects you and other patients whose appointments are after yours.  Also, if you miss three or more appointments without notifying the office, you may be dismissed from the clinic at the provider?s discretion.    ?  ?For prescription refill requests, have your pharmacy contact our office and allow 72 hours for refills to be completed.   ? ?Today you received the following chemotherapy and/or immunotherapy agents: docetaxel    ?  ?To help prevent nausea and vomiting after your treatment, we encourage you to take your nausea medication as directed. ? ?BELOW ARE SYMPTOMS THAT SHOULD BE REPORTED IMMEDIATELY: ?*FEVER GREATER THAN 100.4 F (38 ?C) OR HIGHER ?*CHILLS OR SWEATING ?*NAUSEA AND VOMITING THAT IS NOT CONTROLLED WITH YOUR NAUSEA MEDICATION ?*UNUSUAL SHORTNESS OF BREATH ?*UNUSUAL BRUISING OR BLEEDING ?*URINARY PROBLEMS (pain or burning when urinating, or frequent urination) ?*BOWEL PROBLEMS (unusual diarrhea, constipation, pain near the anus) ?TENDERNESS IN MOUTH AND THROAT WITH OR WITHOUT PRESENCE OF ULCERS (sore throat, sores in mouth, or a toothache) ?UNUSUAL RASH, SWELLING OR PAIN  ?UNUSUAL VAGINAL DISCHARGE OR ITCHING  ? ?Items with * indicate a potential emergency and should be followed up as soon as possible or go to the Emergency Department if any problems should occur. ? ?Please show the CHEMOTHERAPY ALERT CARD or IMMUNOTHERAPY ALERT CARD at check-in  to the Emergency Department and triage nurse. ? ?Should you have questions after your visit or need to cancel or reschedule your appointment, please contact  [facility]  Dept: [phone]  and follow the prompts.  Office hours are 8:00 a.m. to 4:30 p.m. Monday - Friday. Please note that voicemails left after 4:00 p.m. may not be returned until the following business day.  We are closed weekends and major holidays. You have access to a nurse at all times for urgent questions. Please call the main number to the clinic Dept: [phone] and follow the prompts. ? ? ?For any non-urgent questions, you may also contact your provider using MyChart. We now offer e-Visits for anyone 18 and older to request care online for non-urgent symptoms. For details visit [email or web address]. ?  ?Also download the MyChart app! Go to the app store, search "MyChart", open the app, select , and log in with your MyChart username and password. ? ?Due to Covid, a mask is required upon entering the hospital/clinic. If you do not have a mask, one will be given to you upon arrival. For doctor visits, patients may have 1 support person aged 18 or older with them. For treatment visits, patients cannot have anyone with them due to current Covid guidelines and our immunocompromised population.  ? ?

## 2021-02-22 ENCOUNTER — Telehealth: Payer: Self-pay | Admitting: *Deleted

## 2021-02-22 LAB — PROSTATE-SPECIFIC AG, SERUM (LABCORP): Prostate Specific Ag, Serum: 886 ng/mL — ABNORMAL HIGH (ref 0.0–4.0)

## 2021-02-22 NOTE — Telephone Encounter (Signed)
PC to patient, informed him his PSA is down to 886.  He verbalizes understanding.

## 2021-02-22 NOTE — Telephone Encounter (Signed)
-----   Message from Patton Salles, RN sent at 02/22/2021  9:24 AM EST -----  ----- Message ----- From: [name], MD Sent: 02/22/2021   9:12 AM EST To: Patton Salles, RN  Please let him know his PSA is down

## 2021-02-23 ENCOUNTER — Inpatient Hospital Stay: Payer: Medicare Other

## 2021-02-23 ENCOUNTER — Other Ambulatory Visit: Payer: Self-pay

## 2021-02-23 VITALS — BP 116/66 | HR 79 | Temp 98.2°F | Resp 20

## 2021-02-23 DIAGNOSIS — C61 Malignant neoplasm of prostate: Secondary | ICD-10-CM

## 2021-02-23 MED ORDER — PEGFILGRASTIM-CBQV 6 MG/0.6ML ~~LOC~~ SOSY
6.0000 mg | PREFILLED_SYRINGE | Freq: Once | SUBCUTANEOUS | Status: AC
  Administered 2021-02-23: 13:00:00 6 mg via SUBCUTANEOUS
  Filled 2021-02-23: qty 0.6

## 2021-02-23 NOTE — Patient Instructions (Signed)

## 2021-03-14 ENCOUNTER — Inpatient Hospital Stay: Payer: Medicare Other | Admitting: Oncology

## 2021-03-14 ENCOUNTER — Inpatient Hospital Stay: Payer: Medicare Other | Attending: Oncology

## 2021-03-14 ENCOUNTER — Inpatient Hospital Stay: Payer: Medicare Other

## 2021-03-14 ENCOUNTER — Other Ambulatory Visit: Payer: Self-pay

## 2021-03-14 VITALS — BP 132/65 | HR 79 | Temp 97.7°F | Resp 17 | Ht 75.0 in | Wt 230.8 lb

## 2021-03-14 DIAGNOSIS — Z79899 Other long term (current) drug therapy: Secondary | ICD-10-CM | POA: Insufficient documentation

## 2021-03-14 DIAGNOSIS — C61 Malignant neoplasm of prostate: Secondary | ICD-10-CM

## 2021-03-14 DIAGNOSIS — Z9221 Personal history of antineoplastic chemotherapy: Secondary | ICD-10-CM | POA: Diagnosis not present

## 2021-03-14 DIAGNOSIS — Z923 Personal history of irradiation: Secondary | ICD-10-CM | POA: Diagnosis not present

## 2021-03-14 DIAGNOSIS — Z5189 Encounter for other specified aftercare: Secondary | ICD-10-CM | POA: Diagnosis not present

## 2021-03-14 DIAGNOSIS — Z95828 Presence of other vascular implants and grafts: Secondary | ICD-10-CM

## 2021-03-14 DIAGNOSIS — C7951 Secondary malignant neoplasm of bone: Secondary | ICD-10-CM | POA: Insufficient documentation

## 2021-03-14 LAB — CMP (CANCER CENTER ONLY)
ALT: 11 U/L (ref 0–44)
AST: 14 U/L — ABNORMAL LOW (ref 15–41)
Albumin: 3.3 g/dL — ABNORMAL LOW (ref 3.5–5.0)
Alkaline Phosphatase: 51 U/L (ref 38–126)
Anion gap: 8 (ref 5–15)
BUN: 18 mg/dL (ref 8–23)
CO2: 24 mmol/L (ref 22–32)
Calcium: 8.2 mg/dL — ABNORMAL LOW (ref 8.9–10.3)
Chloride: 107 mmol/L (ref 98–111)
Creatinine: 0.94 mg/dL (ref 0.61–1.24)
GFR, Estimated: >60 mL/min (ref >60)
Glucose, Bld: 113 mg/dL — ABNORMAL HIGH (ref 70–99)
Potassium: 4.5 mmol/L (ref 3.5–5.1)
Sodium: 139 mmol/L (ref 135–145)
Total Bilirubin: 0.5 mg/dL (ref 0.3–1.2)
Total Protein: 5.7 g/dL — ABNORMAL LOW (ref 6.5–8.1)

## 2021-03-14 LAB — CBC WITH DIFFERENTIAL (CANCER CENTER ONLY)
Abs Immature Granulocytes: 0.04 10*3/uL (ref 0.00–0.07)
Basophils Absolute: 0.1 10*3/uL (ref 0.0–0.1)
Basophils Relative: 1 %
Eosinophils Absolute: 0 10*3/uL (ref 0.0–0.5)
Eosinophils Relative: 0 %
HCT: 28.3 % — ABNORMAL LOW (ref 39.0–52.0)
Hemoglobin: 9.3 g/dL — ABNORMAL LOW (ref 13.0–17.0)
Immature Granulocytes: 1 %
Lymphocytes Relative: 18 %
Lymphs Abs: 1 10*3/uL (ref 0.7–4.0)
MCH: 32.6 pg (ref 26.0–34.0)
MCHC: 32.9 g/dL (ref 30.0–36.0)
MCV: 99.3 fL (ref 80.0–100.0)
Monocytes Absolute: 0.5 10*3/uL (ref 0.1–1.0)
Monocytes Relative: 9 %
Neutro Abs: 4 10*3/uL (ref 1.7–7.7)
Neutrophils Relative %: 71 %
Platelet Count: 99 10*3/uL — ABNORMAL LOW (ref 150–400)
RBC: 2.85 MIL/uL — ABNORMAL LOW (ref 4.22–5.81)
RDW: 18.7 % — ABNORMAL HIGH (ref 11.5–15.5)
WBC Count: 5.5 10*3/uL (ref 4.0–10.5)
nRBC: 0 % (ref 0.0–0.2)

## 2021-03-14 MED ORDER — SODIUM CHLORIDE 0.9 % IV SOLN: Freq: Once | INTRAVENOUS | Status: AC

## 2021-03-14 MED ORDER — SODIUM CHLORIDE 0.9% FLUSH
10.0000 mL | INTRAVENOUS | Status: DC | PRN
  Administered 2021-03-14: 10 mL via INTRAVENOUS

## 2021-03-14 MED ORDER — HEPARIN SOD (PORK) LOCK FLUSH 100 UNIT/ML IV SOLN
500.0000 [IU] | Freq: Once | INTRAVENOUS | Status: AC | PRN
  Administered 2021-03-14: 500 [IU]

## 2021-03-14 MED ORDER — SODIUM CHLORIDE 0.9% FLUSH
10.0000 mL | INTRAVENOUS | Status: DC | PRN
  Administered 2021-03-14: 10 mL

## 2021-03-14 MED ORDER — SODIUM CHLORIDE 0.9 % IV SOLN
10.0000 mg | Freq: Once | INTRAVENOUS | Status: AC
  Administered 2021-03-14: 10 mg via INTRAVENOUS
  Filled 2021-03-14: qty 10

## 2021-03-14 MED ORDER — SODIUM CHLORIDE 0.9 % IV SOLN
75.0000 mg/m2 | Freq: Once | INTRAVENOUS | Status: AC
  Administered 2021-03-14: 180 mg via INTRAVENOUS
  Filled 2021-03-14: qty 18

## 2021-03-14 NOTE — Progress Notes (Signed)
Hematology and Oncology Follow Up Visit  [name] [id number] 02-18-1946 76 y.o. 03/14/2021 8:37 AM   Principle Diagnosis: 76 year old man with advanced prostate cancer and disease to the bone diagnosed in 2013.  He has castration-resistant after presenting in 2013 with with localized disease Gleason score 4 + 3 = 7 and PSA 6.45.   Prior Therapy:  1. He is status post a prostatectomy and lymph node dissection done on November 05, 2001.   2. S/P 6480 cGy of radiation therapy in 36 fractions between February and April 2005.   3. He developed advanced disease and was treated with Lupron in 2011.  His PSA nadir down to 4.11. PSA was up to 5.91 in December of 2012 with a testosterone level of 35. His staging workup did not reveal any bony metastasis.  4. Casodex was added to Lupron on 05/2011. This was discontinued in January 2016 due to progression of disease.  5. Zytiga 1000 mg daily started on 05/13/2014.  Therapy discontinued in December 2018 because of progression of disease. Bone scan of the time showed limited metastatic disease.  6.  He is status post radiation to pelvic lymph nodes between October 5 and December 11, 2018.  He received total of 40 Gray in 5 fractions.  7. Xtandi 160 mg daily started in December 2018.  Therapy discontinued in September 2021.  8. Xofigo monthly infusion started in October 2021.  He completed 6 months of therapy.  Current therapy:   Taxotere chemotherapy 75 mg per metered square every 3 weeks started in November 29, 2020.  He is here for cycle 6 of therapy.  Eligard 30 mg every 4 months.  Next Eligard will be given in February 2023.  Xgeva 120 mg given every 4 weeks started in October 2021.  He will receive Xgeva in 3 weeks.    Interim History: [name] returns today for repeat evaluation.  Since her last visit, he completed the last cycle of chemotherapy without any major complaints.  He denies any nausea, vomiting or abdominal pain.  He  denies any worsening neuropathy or excessive fatigue.  His performance status and quality of life remained reasonable.  He denies any worsening neuropathy and continues to play the guitar although has not been able to perform life homes.  He does report fatigue and tiredness and occasional dyspnea on exertion.  Denies any worsening edema.  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          Medications: Reviewed without changes. Current Outpatient Medications  Medication Sig Dispense Refill   ALPRAZolam (XANAX) 0.5 MG tablet Take 0.5 mg by mouth 3 (three) times daily as needed for sleep. (Patient not taking: No sig reported)     amLODipine (NORVASC) 5 MG tablet amlodipine 5 mg tablet  Take 1 tablet every day by oral route.     Ascorbic Acid (VITAMIN C) 100 MG CHEW Vitamin C  1 once a day     aspirin EC 81 MG tablet Take 81 mg by mouth daily.     Bergamot Oil OIL Bergamot  Take 1 or 2 capsules daily     calcium-vitamin D (OSCAL 500/200 D-3) 500-200 MG-UNIT tablet Take 1 tablet by mouth daily with breakfast. 1 tablet    Cholecalciferol (VITAMIN D3) 3000 units TABS cholecalciferol (vitamin D3) 1,000 unit tablet  1 tablet orally once a day     Leuprolide Acetate (LUPRON DEPOT IM) Inject into the muscle every 4 (four) months.  lidocaine-prilocaine (EMLA) cream Apply 1 application topically as needed. 30 g 0   Multiple Vitamins-Minerals (MULTIVITAMIN ADULT PO) multivitamin  1 tablet orally once a day     NON FORMULARY CBD oil 500 mg  Take 1 dropperful under tongue in the evening or at bedtime     Omega-3 Fatty Acids (FISH OIL) 1000 MG CAPS Fish Oil  2 tablet po daily     prochlorperazine (COMPAZINE) 10 MG tablet Take 1 tablet (10 mg total) by mouth every 6 (six) hours as needed for nausea or vomiting. 30 tablet 0   XTANDI 40 MG tablet TAKE 4 TABLETS (160 MG TOTAL) BY MOUTH DAILY. 120 tablet 0   No current facility-administered medications for this visit.     Allergies:  Allergies  Allergen Reactions   Penicillins Rash and Other (See Comments)    Has patient had a PCN reaction causing immediate rash, facial/tongue/throat swelling, SOB or lightheadedness with hypotension: YES Has patient had a PCN reaction causing severe rash involving mucus membranes or skin necrosis: YES Has patient had a PCN reaction that required hospitalization: NO Has patient had a PCN reaction occurring within the last 10 years: NO       Physical Exam:         Blood pressure 132/65, pulse 79, temperature 97.7 F (36.5 C), temperature source Axillary, resp. rate 17, height 6\' 3"  (1.905 m), weight 230 lb 12.8 oz (104.7 kg), SpO2 100 %.       ECOG: 1    General appearance: Comfortable appearing without any discomfort Head: Normocephalic without any trauma Oropharynx: Mucous membranes are moist and pink without any thrush or ulcers. Eyes: Pupils are equal and round reactive to light. Lymph nodes: No cervical, supraclavicular, inguinal or axillary lymphadenopathy.   Heart:regular rate and rhythm.  S1 and S2 without leg edema. Lung: Clear without any rhonchi or wheezes.  No dullness to percussion. Abdomin: Soft, nontender, nondistended with good bowel sounds.  No hepatosplenomegaly. Musculoskeletal: No joint deformity or effusion.  Full range of motion noted. Neurological: No deficits noted on motor, sensory and deep tendon reflex exam. Skin: No petechial rash or dryness.  Appeared moist.                    Lab Results: Lab Results  Component Value Date   WBC 5.5 03/14/2021   HGB 9.3 (L) 03/14/2021   HCT 28.3 (L) 03/14/2021   MCV 99.3 03/14/2021   PLT 99 (L) 03/14/2021     Chemistry      Component Value Date/Time   NA 139 02/21/2021 0813   NA 141 02/01/2017 0749   K 4.2 02/21/2021 0813   K 3.5 02/01/2017 0749   CL 108 02/21/2021 0813   CL 104 06/11/2012 0817   CO2 26 02/21/2021 0813   CO2 26 02/01/2017 0749   BUN 19  02/21/2021 0813   BUN 14.5 02/01/2017 0749   CREATININE 0.85 02/21/2021 0813   CREATININE 0.9 02/01/2017 0749      Component Value Date/Time   CALCIUM 8.1 (L) 02/21/2021 0813   CALCIUM 9.2 02/01/2017 0749   ALKPHOS 63 02/21/2021 0813   ALKPHOS 70 02/01/2017 0749   AST 18 02/21/2021 0813   AST 17 02/01/2017 0749   ALT 17 02/21/2021 0813   ALT 14 02/01/2017 0749   BILITOT 0.5 02/21/2021 0813   BILITOT 0.95 02/01/2017 0749       Latest Reference Range & Units 01/10/21 11:20 01/31/21 08:21 02/21/21 08:13  Prostate  Specific Ag, Serum 0.0 - 4.0 ng/mL 936.0 (H) 896.0 (H) 886.0 (H)  (H): Data is abnormally high      Impression and Plan:  76 year old man with:  1.  Advanced prostate cancer with disease to the bone diagnosed in 2013.  He has castration-resistant disease at this time.  Risks and benefits of continuing Taxotere chemotherapy were reviewed at this time.  Complications that include nausea, vomiting, myelosuppression and neutropenia were reiterated.  He is agreeable to continue at this time.  His PSA showing modest decline in overall disease stabilization.  Plan is to continue the same dose and schedule and switch to a different salvage therapy if he has disease progression.   2. Androgen deprivation.  He will receive that with the next cycle of treatment on February 7.  Eligard will be given every 4 months.  3.  Bone directed therapy: He is currently receiving Xgeva every 6 weeks.  This will be due with the next treatment.  4.  Growth factor support.  Will receive growth factor support after each cycle of therapy given his risk of neutropenia.  5.  Prognosis and goals of care: His disease is incurable although aggressive measures are warranted at this time.  6. Follow up: He will return in 3 weeks for the next cycle of therapy.  30 minutes were dedicated to this visit.  The time was spent on reviewing laboratory data, disease status update and outlining future plan of  care review.  Zola Button, MD 1/17/20238:37 AM

## 2021-03-14 NOTE — Progress Notes (Signed)
Per Dr. Alen Blew OK to trt today w/ Plts 99

## 2021-03-14 NOTE — Patient Instructions (Signed)
[facility]  Discharge Instructions: °Thank you for choosing [facility] to provide your oncology and hematology care.  ° °If you have a lab appointment with the Cancer Center, please go directly to the Cancer Center and check in at the registration area. °  °Wear comfortable clothing and clothing appropriate for easy access to any Portacath or PICC line.  ° °We strive to give you quality time with your provider. You may need to reschedule your appointment if you arrive late (15 or more minutes).  Arriving late affects you and other patients whose appointments are after yours.  Also, if you miss three or more appointments without notifying the office, you may be dismissed from the clinic at the provider’s discretion.    °  °For prescription refill requests, have your pharmacy contact our office and allow 72 hours for refills to be completed.   ° °Today you received the following chemotherapy and/or immunotherapy agents Docetaxel    °  °To help prevent nausea and vomiting after your treatment, we encourage you to take your nausea medication as directed. ° °BELOW ARE SYMPTOMS THAT SHOULD BE REPORTED IMMEDIATELY: °*FEVER GREATER THAN 100.4 F (38 °C) OR HIGHER °*CHILLS OR SWEATING °*NAUSEA AND VOMITING THAT IS NOT CONTROLLED WITH YOUR NAUSEA MEDICATION °*UNUSUAL SHORTNESS OF BREATH °*UNUSUAL BRUISING OR BLEEDING °*URINARY PROBLEMS (pain or burning when urinating, or frequent urination) °*BOWEL PROBLEMS (unusual diarrhea, constipation, pain near the anus) °TENDERNESS IN MOUTH AND THROAT WITH OR WITHOUT PRESENCE OF ULCERS (sore throat, sores in mouth, or a toothache) °UNUSUAL RASH, SWELLING OR PAIN  °UNUSUAL VAGINAL DISCHARGE OR ITCHING  ° °Items with * indicate a potential emergency and should be followed up as soon as possible or go to the Emergency Department if any problems should occur. ° °Please show the CHEMOTHERAPY ALERT CARD or IMMUNOTHERAPY ALERT CARD at check-in to  the Emergency Department and triage nurse. ° °Should you have questions after your visit or need to cancel or reschedule your appointment, please contact [facility]  Dept: [phone]  and follow the prompts.  Office hours are 8:00 a.m. to 4:30 p.m. Monday - Friday. Please note that voicemails left after 4:00 p.m. may not be returned until the following business day.  We are closed weekends and major holidays. You have access to a nurse at all times for urgent questions. Please call the main number to the clinic Dept: [phone] and follow the prompts. ° ° °For any non-urgent questions, you may also contact your provider using MyChart. We now offer e-Visits for anyone 18 and older to request care online for non-urgent symptoms. For details visit [email or web address]. °  °Also download the MyChart app! Go to the app store, search "MyChart", open the app, select [facility], and log in with your MyChart username and password. ° °Due to Covid, a mask is required upon entering the hospital/clinic. If you do not have a mask, one will be given to you upon arrival. For doctor visits, patients may have 1 support person aged 18 or older with them. For treatment visits, patients cannot have anyone with them due to current Covid guidelines and our immunocompromised population.  ° °

## 2021-03-15 ENCOUNTER — Telehealth: Payer: Self-pay | Admitting: *Deleted

## 2021-03-15 LAB — PROSTATE-SPECIFIC AG, SERUM (LABCORP): Prostate Specific Ag, Serum: 782 ng/mL — ABNORMAL HIGH (ref 0.0–4.0)

## 2021-03-15 NOTE — Telephone Encounter (Signed)
-----   Message from [name], MD sent at 03/15/2021  8:24 AM EST ----- Please let him know his PSA is down

## 2021-03-15 NOTE — Telephone Encounter (Signed)
Contacted patient with message below from Dr. Alen Blew r/t PSA results. Patient verbalized understanding.

## 2021-03-16 ENCOUNTER — Other Ambulatory Visit: Payer: Self-pay

## 2021-03-16 ENCOUNTER — Inpatient Hospital Stay: Payer: Medicare Other

## 2021-03-16 VITALS — BP 136/84 | HR 79 | Temp 98.2°F | Resp 18

## 2021-03-16 DIAGNOSIS — C61 Malignant neoplasm of prostate: Secondary | ICD-10-CM | POA: Diagnosis not present

## 2021-03-16 MED ORDER — LEUPROLIDE ACETATE (4 MONTH) 30 MG ~~LOC~~ KIT: 30.0000 mg | PACK | Freq: Once | SUBCUTANEOUS | Status: DC

## 2021-03-16 MED ORDER — PEGFILGRASTIM-CBQV 6 MG/0.6ML ~~LOC~~ SOSY
6.0000 mg | PREFILLED_SYRINGE | Freq: Once | SUBCUTANEOUS | Status: AC
  Administered 2021-03-16: 6 mg via SUBCUTANEOUS
  Filled 2021-03-16: qty 0.6

## 2021-03-16 MED ORDER — DENOSUMAB 120 MG/1.7ML ~~LOC~~ SOLN: 120.0000 mg | Freq: Once | SUBCUTANEOUS | Status: DC

## 2021-03-16 NOTE — Addendum Note (Signed)
Addended by: Henreitta Leber E on: 03/16/2021 12:00 PM   Modules accepted: Orders

## 2021-03-16 NOTE — Progress Notes (Signed)
Per Dr. Hazeline Junker notes patient does not need Eligard and Xgeva today. Arbie Cookey [facility] will update treatment plan. He only received Udenyca.

## 2021-03-16 NOTE — Patient Instructions (Signed)

## 2021-04-03 MED FILL — Dexamethasone Sodium Phosphate Inj 100 MG/10ML: INTRAMUSCULAR | Qty: 1 | Status: AC

## 2021-04-04 ENCOUNTER — Other Ambulatory Visit: Payer: Self-pay

## 2021-04-04 ENCOUNTER — Inpatient Hospital Stay: Payer: Medicare Other

## 2021-04-04 ENCOUNTER — Inpatient Hospital Stay: Payer: Medicare Other | Admitting: Oncology

## 2021-04-04 ENCOUNTER — Inpatient Hospital Stay: Payer: Medicare Other | Attending: Oncology

## 2021-04-04 VITALS — BP 133/68 | HR 82 | Temp 97.2°F | Resp 17 | Wt 232.7 lb

## 2021-04-04 DIAGNOSIS — Z7982 Long term (current) use of aspirin: Secondary | ICD-10-CM | POA: Insufficient documentation

## 2021-04-04 DIAGNOSIS — Z79899 Other long term (current) drug therapy: Secondary | ICD-10-CM | POA: Diagnosis not present

## 2021-04-04 DIAGNOSIS — Z95828 Presence of other vascular implants and grafts: Secondary | ICD-10-CM | POA: Diagnosis not present

## 2021-04-04 DIAGNOSIS — C7951 Secondary malignant neoplasm of bone: Secondary | ICD-10-CM | POA: Diagnosis not present

## 2021-04-04 DIAGNOSIS — Z923 Personal history of irradiation: Secondary | ICD-10-CM | POA: Insufficient documentation

## 2021-04-04 DIAGNOSIS — C61 Malignant neoplasm of prostate: Secondary | ICD-10-CM | POA: Insufficient documentation

## 2021-04-04 DIAGNOSIS — Z79818 Long term (current) use of other agents affecting estrogen receptors and estrogen levels: Secondary | ICD-10-CM | POA: Insufficient documentation

## 2021-04-04 DIAGNOSIS — Z191 Hormone sensitive malignancy status: Secondary | ICD-10-CM | POA: Insufficient documentation

## 2021-04-04 DIAGNOSIS — Z5111 Encounter for antineoplastic chemotherapy: Secondary | ICD-10-CM | POA: Diagnosis not present

## 2021-04-04 LAB — CBC WITH DIFFERENTIAL (CANCER CENTER ONLY)
Abs Immature Granulocytes: 0.04 10*3/uL (ref 0.00–0.07)
Basophils Absolute: 0 10*3/uL (ref 0.0–0.1)
Basophils Relative: 1 %
Eosinophils Absolute: 0 10*3/uL (ref 0.0–0.5)
Eosinophils Relative: 0 %
HCT: 29.7 % — ABNORMAL LOW (ref 39.0–52.0)
Hemoglobin: 9.4 g/dL — ABNORMAL LOW (ref 13.0–17.0)
Immature Granulocytes: 1 %
Lymphocytes Relative: 18 %
Lymphs Abs: 0.9 10*3/uL (ref 0.7–4.0)
MCH: 31.6 pg (ref 26.0–34.0)
MCHC: 31.6 g/dL (ref 30.0–36.0)
MCV: 100 fL (ref 80.0–100.0)
Monocytes Absolute: 0.4 10*3/uL (ref 0.1–1.0)
Monocytes Relative: 9 %
Neutro Abs: 3.6 10*3/uL (ref 1.7–7.7)
Neutrophils Relative %: 71 %
Platelet Count: 96 10*3/uL — ABNORMAL LOW (ref 150–400)
RBC: 2.97 MIL/uL — ABNORMAL LOW (ref 4.22–5.81)
RDW: 18.1 % — ABNORMAL HIGH (ref 11.5–15.5)
WBC Count: 4.9 10*3/uL (ref 4.0–10.5)
nRBC: 0 % (ref 0.0–0.2)

## 2021-04-04 LAB — CMP (CANCER CENTER ONLY)
ALT: 10 U/L (ref 0–44)
AST: 14 U/L — ABNORMAL LOW (ref 15–41)
Albumin: 3.2 g/dL — ABNORMAL LOW (ref 3.5–5.0)
Alkaline Phosphatase: 42 U/L (ref 38–126)
Anion gap: 5 (ref 5–15)
BUN: 16 mg/dL (ref 8–23)
CO2: 26 mmol/L (ref 22–32)
Calcium: 8 mg/dL — ABNORMAL LOW (ref 8.9–10.3)
Chloride: 108 mmol/L (ref 98–111)
Creatinine: 0.86 mg/dL (ref 0.61–1.24)
GFR, Estimated: >60 mL/min (ref >60)
Glucose, Bld: 116 mg/dL — ABNORMAL HIGH (ref 70–99)
Potassium: 4.3 mmol/L (ref 3.5–5.1)
Sodium: 139 mmol/L (ref 135–145)
Total Bilirubin: 0.5 mg/dL (ref 0.3–1.2)
Total Protein: 5.6 g/dL — ABNORMAL LOW (ref 6.5–8.1)

## 2021-04-04 MED ORDER — SODIUM CHLORIDE 0.9 % IV SOLN
60.0000 mg/m2 | Freq: Once | INTRAVENOUS | Status: AC
  Administered 2021-04-04: 140 mg via INTRAVENOUS
  Filled 2021-04-04: qty 14

## 2021-04-04 MED ORDER — SODIUM CHLORIDE 0.9% FLUSH
10.0000 mL | INTRAVENOUS | Status: DC | PRN
  Administered 2021-04-04: 10 mL

## 2021-04-04 MED ORDER — SODIUM CHLORIDE 0.9 % IV SOLN
10.0000 mg | Freq: Once | INTRAVENOUS | Status: AC
  Administered 2021-04-04: 10 mg via INTRAVENOUS
  Filled 2021-04-04: qty 10

## 2021-04-04 MED ORDER — HEPARIN SOD (PORK) LOCK FLUSH 100 UNIT/ML IV SOLN
500.0000 [IU] | Freq: Once | INTRAVENOUS | Status: AC | PRN
  Administered 2021-04-04: 500 [IU]

## 2021-04-04 MED ORDER — DENOSUMAB 120 MG/1.7ML ~~LOC~~ SOLN
120.0000 mg | Freq: Once | SUBCUTANEOUS | Status: AC
  Administered 2021-04-04: 120 mg via SUBCUTANEOUS
  Filled 2021-04-04: qty 1.7

## 2021-04-04 MED ORDER — SODIUM CHLORIDE 0.9% FLUSH
10.0000 mL | INTRAVENOUS | Status: AC | PRN
  Administered 2021-04-04: 10 mL

## 2021-04-04 MED ORDER — LEUPROLIDE ACETATE (4 MONTH) 30 MG ~~LOC~~ KIT
30.0000 mg | PACK | Freq: Once | SUBCUTANEOUS | Status: AC
  Administered 2021-04-04: 30 mg via SUBCUTANEOUS
  Filled 2021-04-04: qty 30

## 2021-04-04 MED ORDER — SODIUM CHLORIDE 0.9 % IV SOLN: Freq: Once | INTRAVENOUS | Status: AC

## 2021-04-04 NOTE — Patient Instructions (Signed)
[facility]  Discharge Instructions: Thank you for choosing [facility] to provide your oncology and hematology care.   If you have a lab appointment with the [facility], please go directly to the [facility] and check in at the registration area.   Wear comfortable clothing and clothing appropriate for easy access to any Portacath or PICC line.   We strive to give you quality time with your provider. You may need to reschedule your appointment if you arrive late (15 or more minutes).  Arriving late affects you and other patients whose appointments are after yours.  Also, if you miss three or more appointments without notifying the office, you may be dismissed from the clinic at the providers discretion.      For prescription refill requests, have your pharmacy contact our office and allow 72 hours for refills to be completed.    Today you received the following chemotherapy and/or immunotherapy agents: Docetaxel    To help prevent nausea and vomiting after your treatment, we encourage you to take your nausea medication as directed.  BELOW ARE SYMPTOMS THAT SHOULD BE REPORTED IMMEDIATELY: *FEVER GREATER THAN 100.4 F (38 C) OR HIGHER *CHILLS OR SWEATING *NAUSEA AND VOMITING THAT IS NOT CONTROLLED WITH YOUR NAUSEA MEDICATION *UNUSUAL SHORTNESS OF BREATH *UNUSUAL BRUISING OR BLEEDING *URINARY PROBLEMS (pain or burning when urinating, or frequent urination) *BOWEL PROBLEMS (unusual diarrhea, constipation, pain near the anus) TENDERNESS IN MOUTH AND THROAT WITH OR WITHOUT PRESENCE OF ULCERS (sore throat, sores in mouth, or a toothache) UNUSUAL RASH, SWELLING OR PAIN  UNUSUAL VAGINAL DISCHARGE OR ITCHING   Items with * indicate a potential emergency and should be followed up as soon as possible or go to the Emergency Department if any problems should occur.  Please show the CHEMOTHERAPY ALERT CARD or IMMUNOTHERAPY ALERT CARD at check-in to  the Emergency Department and triage nurse.  Should you have questions after your visit or need to cancel or reschedule your appointment, please contact [facility]  Dept: [phone]  and follow the prompts.  Office hours are 8:00 a.m. to 4:30 p.m. Monday - Friday. Please note that voicemails left after 4:00 p.m. may not be returned until the following business day.  We are closed weekends and major holidays. You have access to a nurse at all times for urgent questions. Please call the main number to the clinic Dept: [phone] and follow the prompts.   For any non-urgent questions, you may also contact your provider using MyChart. We now offer e-Visits for anyone 45 and older to request care online for non-urgent symptoms. For details visit [email or web address].   Also download the MyChart app! Go to the app store, search "MyChart", open the app, select [facility], and log in with your MyChart username and password.  Due to Covid, a mask is required upon entering the hospital/clinic. If you do not have a mask, one will be given to you upon arrival. For doctor visits, patients may have 1 support person aged 47 or older with them. For treatment visits, patients cannot have anyone with them due to current Covid guidelines and our immunocompromised population.   Denosumab injection What is this medication? DENOSUMAB (den oh sue mab) slows bone breakdown. Prolia is used to treat osteoporosis in women after menopause and in men, and in people who are taking corticosteroids for 6 months or more. [name] is used to treat a high calcium level due to cancer  and to prevent bone fractures and other bone problems caused by multiple myeloma or cancer bone metastases. [name] is also used to treat giant cell tumor of the bone. This medicine may be used for other purposes; ask your health care provider or pharmacist if you have questions. COMMON BRAND NAME(S): Prolia, XGEVA What  should I tell my care team before I take this medication? They need to know if you have any of these conditions: dental disease having surgery or tooth extraction infection kidney disease low levels of calcium or Vitamin D in the blood malnutrition on hemodialysis skin conditions or sensitivity thyroid or parathyroid disease an unusual reaction to denosumab, other medicines, foods, dyes, or preservatives pregnant or trying to get pregnant breast-feeding How should I use this medication? This medicine is for injection under the skin. It is given by a health care professional in a hospital or clinic setting. A special MedGuide will be given to you before each treatment. Be sure to read this information carefully each time. For Prolia, talk to your pediatrician regarding the use of this medicine in children. Special care may be needed. For [name], talk to your pediatrician regarding the use of this medicine in children. While this drug may be prescribed for children as young as 13 years for selected conditions, precautions do apply. Overdosage: If you think you have taken too much of this medicine contact a poison control center or emergency room at once. NOTE: This medicine is only for you. Do not share this medicine with others. What if I miss a dose? It is important not to miss your dose. Call your doctor or health care professional if you are unable to keep an appointment. What may interact with this medication? Do not take this medicine with any of the following medications: other medicines containing denosumab This medicine may also interact with the following medications: medicines that lower your chance of fighting infection steroid medicines like prednisone or cortisone This list may not describe all possible interactions. Give your health care provider a list of all the medicines, herbs, non-prescription drugs, or dietary supplements you use. Also tell them if you smoke, drink  alcohol, or use illegal drugs. Some items may interact with your medicine. What should I watch for while using this medication? Visit your doctor or health care professional for regular checks on your progress. Your doctor or health care professional may order blood tests and other tests to see how you are doing. Call your doctor or health care professional for advice if you get a fever, chills or sore throat, or other symptoms of a cold or flu. Do not treat yourself. This drug may decrease your body's ability to fight infection. Try to avoid being around people who are sick. You should make sure you get enough calcium and vitamin D while you are taking this medicine, unless your doctor tells you not to. Discuss the foods you eat and the vitamins you take with your health care professional. See your dentist regularly. Brush and floss your teeth as directed. Before you have any dental work done, tell your dentist you are receiving this medicine. Do not become pregnant while taking this medicine or for 5 months after stopping it. Talk with your doctor or health care professional about your birth control options while taking this medicine. Women should inform their doctor if they wish to become pregnant or think they might be pregnant. There is a potential for serious side effects to an unborn child. Talk to  your health care professional or pharmacist for more information. What side effects may I notice from receiving this medication? Side effects that you should report to your doctor or health care professional as soon as possible: allergic reactions like skin rash, itching or hives, swelling of the face, lips, or tongue bone pain breathing problems dizziness jaw pain, especially after dental work redness, blistering, peeling of the skin signs and symptoms of infection like fever or chills; cough; sore throat; pain or trouble passing urine signs of low calcium like fast heartbeat, muscle cramps or  muscle pain; pain, tingling, numbness in the hands or feet; seizures unusual bleeding or bruising unusually weak or tired Side effects that usually do not require medical attention (report to your doctor or health care professional if they continue or are bothersome): constipation diarrhea headache joint pain loss of appetite muscle pain runny nose tiredness upset stomach This list may not describe all possible side effects. Call your doctor for medical advice about side effects. You may report side effects to FDA at 1-800-FDA-1088. Where should I keep my medication? This medicine is only given in a clinic, doctor's office, or other health care setting and will not be stored at home. NOTE: This sheet is a summary. It may not cover all possible information. If you have questions about this medicine, talk to your doctor, pharmacist, or health care provider.  2022 Elsevier/Gold Standard (2017-06-21 00:00:00)  Leuprolide depot injection What is this medication? LEUPROLIDE (loo PROE lide) is a man-made protein that acts like a natural hormone in the body. It decreases testosterone in men and decreases estrogen in women. In men, this medicine is used to treat advanced prostate cancer. In women, some forms of this medicine may be used to treat endometriosis, uterine fibroids, or other male hormone-related problems. This medicine may be used for other purposes; ask your health care provider or pharmacist if you have questions. COMMON BRAND NAME(S): Eligard, Fensolv, Lupron Depot, Lupron Depot-Ped, Viadur What should I tell my care team before I take this medication? They need to know if you have any of these conditions: diabetes heart disease or previous heart attack high blood pressure high cholesterol mental illness osteoporosis pain or difficulty passing urine seizures spinal cord metastasis stroke suicidal thoughts, plans, or attempt; a previous suicide attempt by you or a family  member tobacco smoker unusual vaginal bleeding (women) an unusual or allergic reaction to leuprolide, benzyl alcohol, other medicines, foods, dyes, or preservatives pregnant or trying to get pregnant breast-feeding How should I use this medication? This medicine is for injection into a muscle or for injection under the skin. It is given by a health care professional in a hospital or clinic setting. The specific product will determine how it will be given to you. Make sure you understand which product you receive and how often you will receive it. Talk to your pediatrician regarding the use of this medicine in children. Special care may be needed. Overdosage: If you think you have taken too much of this medicine contact a poison control center or emergency room at once. NOTE: This medicine is only for you. Do not share this medicine with others. What if I miss a dose? It is important not to miss a dose. Call your doctor or health care professional if you are unable to keep an appointment. Depot injections: Depot injections are given either once-monthly, every 12 weeks, every 16 weeks, or every 24 weeks depending on the product you are prescribed. The product  you are prescribed will be based on if you are male or male, and your condition. Make sure you understand your product and dosing. What may interact with this medication? Do not take this medicine with any of the following medications: chasteberry cisapride dronedarone pimozide thioridazine This medicine may also interact with the following medications: herbal or dietary supplements, like black cohosh or DHEA male hormones, like estrogens or progestins and birth control pills, patches, rings, or injections male hormones, like testosterone other medicines that prolong the QT interval (abnormal heart rhythm) This list may not describe all possible interactions. Give your health care provider a list of all the medicines, herbs,  non-prescription drugs, or dietary supplements you use. Also tell them if you smoke, drink alcohol, or use illegal drugs. Some items may interact with your medicine. What should I watch for while using this medication? Visit your doctor or health care professional for regular checks on your progress. During the first weeks of treatment, your symptoms may get worse, but then will improve as you continue your treatment. You may get hot flashes, increased bone pain, increased difficulty passing urine, or an aggravation of nerve symptoms. Discuss these effects with your doctor or health care professional, some of them may improve with continued use of this medicine. Male patients may experience a menstrual cycle or spotting during the first months of therapy with this medicine. If this continues, contact your doctor or health care professional. This medicine may increase blood sugar. Ask your healthcare provider if changes in diet or medicines are needed if you have diabetes. What side effects may I notice from receiving this medication? Side effects that you should report to your doctor or health care professional as soon as possible: allergic reactions like skin rash, itching or hives, swelling of the face, lips, or tongue breathing problems chest pain depression or memory disorders pain in your legs or groin pain at site where injected or implanted seizures severe headache signs and symptoms of high blood sugar such as being more thirsty or hungry or having to urinate more than normal. You may also feel very tired or have blurry vision swelling of the feet and legs suicidal thoughts or other mood changes visual changes vomiting Side effects that usually do not require medical attention (report to your doctor or health care professional if they continue or are bothersome): breast swelling or tenderness decrease in sex drive or performance diarrhea hot flashes loss of appetite muscle, joint,  or bone pains nausea redness or irritation at site where injected or implanted skin problems or acne This list may not describe all possible side effects. Call your doctor for medical advice about side effects. You may report side effects to FDA at 1-800-FDA-1088. Where should I keep my medication? This drug is given in a hospital or clinic and will not be stored at home. NOTE: This sheet is a summary. It may not cover all possible information. If you have questions about this medicine, talk to your doctor, pharmacist, or health care provider.  2022 Elsevier/Gold Standard (2020-11-01 00:00:00)

## 2021-04-04 NOTE — Progress Notes (Signed)
OK to trt w/ Plts 96 per Dr. Alen Blew

## 2021-04-04 NOTE — Progress Notes (Signed)
Hematology and Oncology Follow Up Visit  [name] [id number] 07-08-1945 76 y.o. 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  He presented with Gleason score 4 + 3 = 7 and PSA 6.45 in 2003.   Prior Therapy:  1. He is status post a prostatectomy and lymph node dissection done on November 05, 2001.   2. S/P 6480 cGy of radiation therapy in 36 fractions between February and April 2005.   3. He developed advanced disease and was treated with Lupron in 2011.  His PSA nadir down to 4.11. PSA was up to 5.91 in December of 2012 with a testosterone level of 35. His staging workup did not reveal any bony metastasis.  4. Casodex was added to Lupron on 05/2011. This was discontinued in January 2016 due to progression of disease.  5. Zytiga 1000 mg daily started on 05/13/2014.  Therapy discontinued in December 2018 because of progression of disease. Bone scan of the time showed limited metastatic disease.  6.  He is status post radiation to pelvic lymph nodes between October 5 and December 11, 2018.  He received total of 40 Gray in 5 fractions.  7. Xtandi 160 mg daily started in December 2018.  Therapy discontinued in September 2021.  8. Xofigo monthly infusion started in October 2021.  He completed 6 months of therapy.  Current therapy:   Taxotere chemotherapy 75 mg per metered square every 3 weeks started in November 29, 2020.  He is here for cycle 7  of therapy.  Eligard 30 mg every 4 months.  He will receive Eligard today and repeated in 4 months.  Xgeva 120 mg given every 4 weeks started in October 2021.  He will receive that today and repeated in 6 weeks.    Interim History: [name] presents today for a follow-up visit.  Since last visit, he reports no major changes in his health.  He is reporting more fatigue and tiredness and more excessive tearing associated with  chemotherapy.  He denies any nausea, vomiting or worsening neuropathy.  He denies hematochezia or melena.  His appetite is reasonable and has maintained weight.  His performance status quality of life remains not dramatically different.          Medications: Updated on review. Current Outpatient Medications  Medication Sig Dispense Refill   ALPRAZolam (XANAX) 0.5 MG tablet Take 0.5 mg by mouth 3 (three) times daily as needed for sleep. (Patient not taking: No sig reported)     amLODipine (NORVASC) 5 MG tablet amlodipine 5 mg tablet  Take 1 tablet every day by oral route.     Ascorbic Acid (VITAMIN C) 100 MG CHEW Vitamin C  1 once a day     aspirin EC 81 MG tablet Take 81 mg by mouth daily.     Bergamot Oil OIL Bergamot  Take 1 or 2 capsules daily     calcium-vitamin D (OSCAL 500/200 D-3) 500-200 MG-UNIT tablet Take 1 tablet by mouth daily with breakfast. 1 tablet    Cholecalciferol (VITAMIN D3) 3000 units TABS cholecalciferol (vitamin D3) 1,000 unit tablet  1 tablet orally once a day     Leuprolide Acetate (LUPRON DEPOT IM) Inject into the muscle every 4 (four) months.     lidocaine-prilocaine (EMLA) cream Apply 1 application topically as needed. 30 g 0   Multiple Vitamins-Minerals (MULTIVITAMIN ADULT PO) multivitamin  1 tablet orally once a  day     NON FORMULARY CBD oil 500 mg  Take 1 dropperful under tongue in the evening or at bedtime     Omega-3 Fatty Acids (FISH OIL) 1000 MG CAPS Fish Oil  2 tablet po daily     prochlorperazine (COMPAZINE) 10 MG tablet Take 1 tablet (10 mg total) by mouth every 6 (six) hours as needed for nausea or vomiting. 30 tablet 0   XTANDI 40 MG tablet TAKE 4 TABLETS (160 MG TOTAL) BY MOUTH DAILY. 120 tablet 0   No current facility-administered medications for this visit.    Allergies:  Allergies  Allergen Reactions   Penicillins Rash and Other (See Comments)    Has patient had a PCN reaction causing immediate rash, facial/tongue/throat swelling,  SOB or lightheadedness with hypotension: YES Has patient had a PCN reaction causing severe rash involving mucus membranes or skin necrosis: YES Has patient had a PCN reaction that required hospitalization: NO Has patient had a PCN reaction occurring within the last 10 years: NO       Physical Exam:      Blood pressure 133/68, pulse 82, temperature (!) 97.2 F (36.2 C), temperature source Tympanic, resp. rate 17, weight 232 lb 11.2 oz (105.6 kg), SpO2 100 %.           ECOG: 1     General appearance: Alert, awake without any distress. Head: Atraumatic without abnormalities Oropharynx: Without any thrush or ulcers. Eyes: Conjunctive mildly red and irritated with excessive tearing. Lymph nodes: No lymphadenopathy noted in the cervical, supraclavicular, or axillary nodes Heart:regular rate and rhythm, without any murmurs or gallops.   Lung: Clear to auscultation without any rhonchi, wheezes or dullness to percussion. Abdomin: Soft, nontender without any shifting dullness or ascites. Musculoskeletal: No clubbing or cyanosis. Neurological: No motor or sensory deficits. Skin: No rashes or lesions.                    Lab Results: Lab Results  Component Value Date   WBC 5.5 03/14/2021   HGB 9.3 (L) 03/14/2021   HCT 28.3 (L) 03/14/2021   MCV 99.3 03/14/2021   PLT 99 (L) 03/14/2021     Chemistry      Component Value Date/Time   NA 139 03/14/2021 0755   NA 141 02/01/2017 0749   K 4.5 03/14/2021 0755   K 3.5 02/01/2017 0749   CL 107 03/14/2021 0755   CL 104 06/11/2012 0817   CO2 24 03/14/2021 0755   CO2 26 02/01/2017 0749   BUN 18 03/14/2021 0755   BUN 14.5 02/01/2017 0749   CREATININE 0.94 03/14/2021 0755   CREATININE 0.9 02/01/2017 0749      Component Value Date/Time   CALCIUM 8.2 (L) 03/14/2021 0755   CALCIUM 9.2 02/01/2017 0749   ALKPHOS 51 03/14/2021 0755   ALKPHOS 70 02/01/2017 0749   AST 14 (L) 03/14/2021 0755   AST 17 02/01/2017  0749   ALT 11 03/14/2021 0755   ALT 14 02/01/2017 0749   BILITOT 0.5 03/14/2021 0755   BILITOT 0.95 02/01/2017 0749        Latest Reference Range & Units 01/31/21 08:21 02/21/21 08:13 03/14/21 07:55  Prostate Specific Ag, Serum 0.0 - 4.0 ng/mL 896.0 (H) 886.0 (H) 782.0 (H)  (H): Data is abnormally high   Impression and Plan:  76 year old man with:  1.  Castration-resistant advanced prostate cancer with disease to the bone diagnosed in 2013.    He has tolerated Taxotere chemotherapy  without any major complications.  His PSA showed a modest decline in the last few cycles.  Risks and benefits of continuing this treatment were reviewed at this time.  Complications that include nausea, vomiting, myelosuppression, neutropenia and possible sepsis were reiterated.  Alternative treatment options including Pluvicto among others were reiterated.  After discussion today, he is agreeable to proceed and we will lower the dose of Taxotere given the side effects he is experiencing including fatigue, lacrimal duct dysfunction and thrombocytopenia.   2. Androgen deprivation.  He will receive Eligard today and repeated in 4 months.  Complications including weight gain and hot flashes were discussed.  He is agreeable to proceed.  3.  Bone directed therapy: He will receive Xgeva today and repeated in 6 weeks.  Complications including osteonecrosis of the jaw and hypocalcemia were reviewed.  He is agreeable to proceed.  4.  Growth factor support.  He is at risk for neutropenia and sepsis.  He will receive growth factor support after each cycle.   5.  Prognosis and goals of care: Therapy remains palliative although aggressive measures are warranted this reasonable performance status.  6. Follow up: In 3 weeks for repeat follow-up.  30 minutes were spent on this encounter.  The time was dedicated to reviewing laboratory data, disease status update and outlining future plan of care review.  Zola Button,  MD 2/7/20238:06 AM

## 2021-04-05 ENCOUNTER — Telehealth: Payer: Self-pay | Admitting: *Deleted

## 2021-04-05 ENCOUNTER — Telehealth: Payer: Self-pay | Admitting: Oncology

## 2021-04-05 LAB — PROSTATE-SPECIFIC AG, SERUM (LABCORP): Prostate Specific Ag, Serum: 811 ng/mL — ABNORMAL HIGH (ref 0.0–4.0)

## 2021-04-05 NOTE — Telephone Encounter (Signed)
Called patient regarding upcoming appointments, patient is notified. 

## 2021-04-05 NOTE — Telephone Encounter (Signed)
PC to patient, informed him his PSA from yesterday is 811.  He verbalizes understanding.

## 2021-04-05 NOTE — Telephone Encounter (Signed)
-----   Message from [name], MD sent at 04/05/2021  8:28 AM EST ----- Please let him know his PSA is slightly up

## 2021-04-06 ENCOUNTER — Other Ambulatory Visit: Payer: Self-pay

## 2021-04-06 ENCOUNTER — Inpatient Hospital Stay: Payer: Medicare Other

## 2021-04-06 VITALS — BP 102/70 | HR 84 | Temp 98.2°F | Resp 18

## 2021-04-06 DIAGNOSIS — C61 Malignant neoplasm of prostate: Secondary | ICD-10-CM

## 2021-04-06 MED ORDER — PEGFILGRASTIM-CBQV 6 MG/0.6ML ~~LOC~~ SOSY
6.0000 mg | PREFILLED_SYRINGE | Freq: Once | SUBCUTANEOUS | Status: AC
  Administered 2021-04-06: 6 mg via SUBCUTANEOUS
  Filled 2021-04-06: qty 0.6

## 2021-04-24 MED FILL — Dexamethasone Sodium Phosphate Inj 100 MG/10ML: INTRAMUSCULAR | Qty: 1 | Status: AC

## 2021-04-25 ENCOUNTER — Other Ambulatory Visit: Payer: Self-pay

## 2021-04-25 ENCOUNTER — Inpatient Hospital Stay: Payer: Medicare Other

## 2021-04-25 ENCOUNTER — Inpatient Hospital Stay: Payer: Medicare Other | Admitting: Oncology

## 2021-04-25 VITALS — BP 134/68 | HR 81 | Temp 97.1°F | Resp 16 | Wt 234.4 lb

## 2021-04-25 DIAGNOSIS — Z95828 Presence of other vascular implants and grafts: Secondary | ICD-10-CM | POA: Diagnosis not present

## 2021-04-25 DIAGNOSIS — C61 Malignant neoplasm of prostate: Secondary | ICD-10-CM

## 2021-04-25 LAB — CBC WITH DIFFERENTIAL (CANCER CENTER ONLY)
Abs Immature Granulocytes: 0.05 10*3/uL (ref 0.00–0.07)
Basophils Absolute: 0 10*3/uL (ref 0.0–0.1)
Basophils Relative: 1 %
Eosinophils Absolute: 0 10*3/uL (ref 0.0–0.5)
Eosinophils Relative: 0 %
HCT: 29.6 % — ABNORMAL LOW (ref 39.0–52.0)
Hemoglobin: 9.6 g/dL — ABNORMAL LOW (ref 13.0–17.0)
Immature Granulocytes: 1 %
Lymphocytes Relative: 14 %
Lymphs Abs: 0.8 10*3/uL (ref 0.7–4.0)
MCH: 32.3 pg (ref 26.0–34.0)
MCHC: 32.4 g/dL (ref 30.0–36.0)
MCV: 99.7 fL (ref 80.0–100.0)
Monocytes Absolute: 0.4 10*3/uL (ref 0.1–1.0)
Monocytes Relative: 8 %
Neutro Abs: 4.3 10*3/uL (ref 1.7–7.7)
Neutrophils Relative %: 76 %
Platelet Count: 90 10*3/uL — ABNORMAL LOW (ref 150–400)
RBC: 2.97 MIL/uL — ABNORMAL LOW (ref 4.22–5.81)
RDW: 18.4 % — ABNORMAL HIGH (ref 11.5–15.5)
WBC Count: 5.7 10*3/uL (ref 4.0–10.5)
nRBC: 0 % (ref 0.0–0.2)

## 2021-04-25 LAB — CMP (CANCER CENTER ONLY)
ALT: 11 U/L (ref 0–44)
AST: 15 U/L (ref 15–41)
Albumin: 3.3 g/dL — ABNORMAL LOW (ref 3.5–5.0)
Alkaline Phosphatase: 44 U/L (ref 38–126)
Anion gap: 4 — ABNORMAL LOW (ref 5–15)
BUN: 17 mg/dL (ref 8–23)
CO2: 29 mmol/L (ref 22–32)
Calcium: 8.2 mg/dL — ABNORMAL LOW (ref 8.9–10.3)
Chloride: 106 mmol/L (ref 98–111)
Creatinine: 0.82 mg/dL (ref 0.61–1.24)
GFR, Estimated: >60 mL/min (ref >60)
Glucose, Bld: 122 mg/dL — ABNORMAL HIGH (ref 70–99)
Potassium: 4.4 mmol/L (ref 3.5–5.1)
Sodium: 139 mmol/L (ref 135–145)
Total Bilirubin: 0.5 mg/dL (ref 0.3–1.2)
Total Protein: 5.6 g/dL — ABNORMAL LOW (ref 6.5–8.1)

## 2021-04-25 MED ORDER — SODIUM CHLORIDE 0.9% FLUSH
10.0000 mL | INTRAVENOUS | Status: AC | PRN
  Administered 2021-04-25: 10 mL

## 2021-04-25 NOTE — Progress Notes (Signed)
Hematology and Oncology Follow Up Visit  [name] [id number] [birth year] 76 y.o. 04/25/2021 8:06 AM   Principle Diagnosis: 76 year old man with advanced prostate cancer with disease to the bone diagnosed in 2013.  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   Prior Therapy:  1. He is status post a prostatectomy and lymph node dissection done on November 05, 2001.   2. S/P 6480 cGy of radiation therapy in 36 fractions between February and April 2005.   3. He developed advanced disease and was treated with Lupron in 2011.  His PSA nadir down to 4.11. PSA was up to 5.91 in December of 2012 with a testosterone level of 35. His staging workup did not reveal any bony metastasis.  4. Casodex was added to Lupron on 05/2011. This was discontinued in January 2016 due to progression of disease.  5. Zytiga 1000 mg daily started on 05/13/2014.  Therapy discontinued in December 2018 because of progression of disease. Bone scan of the time showed limited metastatic disease.  6.  He is status post radiation to pelvic lymph nodes between October 5 and December 11, 2018.  He received total of 40 Gray in 5 fractions.  7. Xtandi 160 mg daily started in December 2018.  Therapy discontinued in September 2021.  8. Xofigo monthly infusion started in October 2021.  He completed 6 months of therapy.  Current therapy:   Taxotere chemotherapy 75 mg per metered square every 3 weeks started in November 29, 2020.  He is here for cycle 8 of therapy.  Eligard 30 mg every 4 months.  Next Eligard will be given in June 2023.  Xgeva 120 mg given every 4 weeks started in October 2021.  This will be given with the next chemotherapy treatment in 3 weeks.    Interim History: [name] returns today for a follow-up visit.  Since the last visit, he reports few complaints related to last cycle of chemotherapy.  He has reported some fatigue,  tiredness and excessive tearing that has continued.  He also reported more dyspnea on exertion and lower extremity edema.  He has not reported any neuropathy at this time.  His appetite and performance status remained about the same although slightly declining.          Medications: Reviewed without changes. Current Outpatient Medications  Medication Sig Dispense Refill   ALPRAZolam (XANAX) 0.5 MG tablet Take 0.5 mg by mouth 3 (three) times daily as needed for sleep. (Patient not taking: No sig reported)     amLODipine (NORVASC) 5 MG tablet amlodipine 5 mg tablet  Take 1 tablet every day by oral route.     Ascorbic Acid (VITAMIN C) 100 MG CHEW Vitamin C  1 once a day     aspirin EC 81 MG tablet Take 81 mg by mouth daily.     Bergamot Oil OIL Bergamot  Take 1 or 2 capsules daily     calcium-vitamin D (OSCAL 500/200 D-3) 500-200 MG-UNIT tablet Take 1 tablet by mouth daily with breakfast. 1 tablet    Cholecalciferol (VITAMIN D3) 3000 units TABS cholecalciferol (vitamin D3) 1,000 unit tablet  1 tablet orally once a day     Leuprolide Acetate (LUPRON DEPOT IM) Inject into the muscle every 4 (four) months.     lidocaine-prilocaine (EMLA) cream Apply 1 application topically as needed. 30 g 0   Multiple Vitamins-Minerals (MULTIVITAMIN ADULT PO) multivitamin  1 tablet orally once a day     NON FORMULARY CBD oil 500 mg  Take 1 dropperful under tongue in the evening or at bedtime     Omega-3 Fatty Acids (FISH OIL) 1000 MG CAPS Fish Oil  2 tablet po daily     prochlorperazine (COMPAZINE) 10 MG tablet Take 1 tablet (10 mg total) by mouth every 6 (six) hours as needed for nausea or vomiting. 30 tablet 0   XTANDI 40 MG tablet TAKE 4 TABLETS (160 MG TOTAL) BY MOUTH DAILY. 120 tablet 0   No current facility-administered medications for this visit.    Allergies:  Allergies  Allergen Reactions   Penicillins Rash and Other (See Comments)    Has patient had a PCN reaction causing immediate rash,  facial/tongue/throat swelling, SOB or lightheadedness with hypotension: YES Has patient had a PCN reaction causing severe rash involving mucus membranes or skin necrosis: YES Has patient had a PCN reaction that required hospitalization: NO Has patient had a PCN reaction occurring within the last 10 years: NO       Physical Exam:           Blood pressure 134/68, pulse 81, temperature (!) 97.1 F (36.2 C), temperature source Tympanic, resp. rate 16, weight 234 lb 6.4 oz (106.3 kg), SpO2 100 %.       ECOG: 1   General appearance: Comfortable appearing without any discomfort Head: Normocephalic without any trauma Oropharynx: Mucous membranes are moist and pink without any thrush or ulcers. Eyes: Pupils are equal and round reactive to light. Lymph nodes: No cervical, supraclavicular, inguinal or axillary lymphadenopathy.   Heart:regular rate and rhythm.  S1 and S2 without leg edema. Lung: Clear without any rhonchi or wheezes.  No dullness to percussion. Abdomin: Soft, nontender, nondistended with good bowel sounds.  No hepatosplenomegaly. Musculoskeletal: No joint deformity or effusion.  Full range of motion noted. Neurological: No deficits noted on motor, sensory and deep tendon reflex exam. Skin: No petechial rash or dryness.  Appeared moist.                      Lab Results: Lab Results  Component Value Date   WBC 4.9 04/04/2021   HGB 9.4 (L) 04/04/2021   HCT 29.7 (L) 04/04/2021   MCV 100.0 04/04/2021   PLT 96 (L) 04/04/2021     Chemistry      Component Value Date/Time   NA 139 04/04/2021 0758   NA 141 02/01/2017 0749   K 4.3 04/04/2021 0758   K 3.5 02/01/2017 0749   CL 108 04/04/2021 0758   CL 104 06/11/2012 0817   CO2 26 04/04/2021 0758   CO2 26 02/01/2017 0749   BUN 16 04/04/2021 0758   BUN 14.5 02/01/2017 0749   CREATININE 0.86 04/04/2021 0758   CREATININE 0.9 02/01/2017 0749      Component Value Date/Time   CALCIUM 8.0 (L)  04/04/2021 0758   CALCIUM 9.2 02/01/2017 0749   ALKPHOS 42 04/04/2021 0758   ALKPHOS 70 02/01/2017 0749   AST 14 (L) 04/04/2021 0758   AST 17 02/01/2017 0749   ALT 10 04/04/2021 0758   ALT 14 02/01/2017 0749   BILITOT 0.5 04/04/2021 0758   BILITOT 0.95 02/01/2017 0749         Latest Reference Range & Units 01/31/21 08:21 02/21/21 08:13 03/14/21 07:55 04/04/21 07:58  Prostate Specific Ag, Serum 0.0 - 4.0 ng/mL 896.0 (H) 886.0 (H) 782.0 (H) 811.0 (H)  (H):  Data is abnormally high    Impression and Plan:  76 year old man with:  1.  Advanced prostate cancer with disease to the bone and lymphadenopathy since 2013.  He has castration-resistant at this time.  He is currently on Taxotere chemotherapy with modest PSA decline at this time.  Risks and benefits of continuing this treatment versus switching to different salvage therapy options.  These include Pluvicto versus Jevtana chemotherapy.  After discussion today, we opted to discontinue chemotherapy moving forward and will continue with a period of observation and surveillance briefly.  We will arrange for Pluvicto after the next visit.     2. Androgen deprivation.  Next Eligard will be in 4 months.   3.  Bone directed therapy: He tolerated Xgeva very well without any complaints.  This will be repeated in 3 weeks.  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  4.  Growth factor support.  He will receive growth factor support after each cycle of therapy.  This will be discontinued with chemotherapy.  5.  Prognosis and goals of care: His disease is incurable although aggressive measures are warranted.  6. Follow up: In 3 weeks for repeat follow-up.  30 minutes were dedicated to this encounter.  The time was spent on reviewing laboratory data, disease status update and outlining future plan of care.   Zola Button, MD 2/28/20238:06 AM

## 2021-04-25 NOTE — Addendum Note (Signed)
Addended by: Tora Kindred on: 04/25/2021 04:32 PM   Modules accepted: Orders

## 2021-04-26 ENCOUNTER — Telehealth: Payer: Self-pay

## 2021-04-26 LAB — PROSTATE-SPECIFIC AG, SERUM (LABCORP): Prostate Specific Ag, Serum: 766 ng/mL — ABNORMAL HIGH (ref 0.0–4.0)

## 2021-04-26 NOTE — Telephone Encounter (Signed)
Per Dr. Alen Blew, patient called to advise that PSA drwn on 04/25/21  is down to 766 ng/ml. Upcoming appointment reviewed with pt. ?

## 2021-04-27 ENCOUNTER — Inpatient Hospital Stay: Payer: Medicare Other

## 2021-05-04 ENCOUNTER — Telehealth: Payer: Self-pay | Admitting: Oncology

## 2021-05-04 NOTE — Telephone Encounter (Signed)
Scheduled per 02/28 los, patient has been called and notified.  ?

## 2021-05-16 ENCOUNTER — Encounter (HOSPITAL_COMMUNITY): Payer: Self-pay

## 2021-05-16 ENCOUNTER — Ambulatory Visit (HOSPITAL_COMMUNITY)
Admission: RE | Admit: 2021-05-16 | Discharge: 2021-05-16 | Disposition: A | Discharge status: Home / Self Care | Payer: Medicare Other | Source: Ambulatory Visit | Attending: Oncology | Admitting: Oncology

## 2021-05-16 ENCOUNTER — Other Ambulatory Visit: Payer: Self-pay | Admitting: *Deleted

## 2021-05-16 ENCOUNTER — Inpatient Hospital Stay: Payer: Medicare Other | Admitting: Oncology

## 2021-05-16 ENCOUNTER — Telehealth: Payer: Self-pay | Admitting: *Deleted

## 2021-05-16 ENCOUNTER — Inpatient Hospital Stay: Payer: Medicare Other

## 2021-05-16 ENCOUNTER — Other Ambulatory Visit (HOSPITAL_COMMUNITY): Payer: Self-pay

## 2021-05-16 ENCOUNTER — Other Ambulatory Visit: Payer: Self-pay

## 2021-05-16 ENCOUNTER — Ambulatory Visit: Payer: Medicare Other

## 2021-05-16 ENCOUNTER — Inpatient Hospital Stay: Payer: Medicare Other | Attending: Oncology

## 2021-05-16 VITALS — BP 132/71 | HR 81 | Temp 97.9°F | Resp 17 | Wt 232.5 lb

## 2021-05-16 DIAGNOSIS — R0602 Shortness of breath: Secondary | ICD-10-CM

## 2021-05-16 DIAGNOSIS — C61 Malignant neoplasm of prostate: Secondary | ICD-10-CM

## 2021-05-16 DIAGNOSIS — R079 Chest pain, unspecified: Secondary | ICD-10-CM | POA: Insufficient documentation

## 2021-05-16 DIAGNOSIS — Z95828 Presence of other vascular implants and grafts: Secondary | ICD-10-CM

## 2021-05-16 LAB — CMP (CANCER CENTER ONLY)
ALT: 10 U/L (ref 0–44)
AST: 15 U/L (ref 15–41)
Albumin: 3.4 g/dL — ABNORMAL LOW (ref 3.5–5.0)
Alkaline Phosphatase: 44 U/L (ref 38–126)
Anion gap: 4 — ABNORMAL LOW (ref 5–15)
BUN: 16 mg/dL (ref 8–23)
CO2: 29 mmol/L (ref 22–32)
Calcium: 8.3 mg/dL — ABNORMAL LOW (ref 8.9–10.3)
Chloride: 105 mmol/L (ref 98–111)
Creatinine: 0.76 mg/dL (ref 0.61–1.24)
GFR, Estimated: >60 mL/min (ref >60)
Glucose, Bld: 150 mg/dL — ABNORMAL HIGH (ref 70–99)
Potassium: 4.2 mmol/L (ref 3.5–5.1)
Sodium: 138 mmol/L (ref 135–145)
Total Bilirubin: 0.5 mg/dL (ref 0.3–1.2)
Total Protein: 6 g/dL — ABNORMAL LOW (ref 6.5–8.1)

## 2021-05-16 LAB — CBC WITH DIFFERENTIAL (CANCER CENTER ONLY)
Abs Immature Granulocytes: 0.02 10*3/uL (ref 0.00–0.07)
Basophils Absolute: 0 10*3/uL (ref 0.0–0.1)
Basophils Relative: 1 %
Eosinophils Absolute: 0.1 10*3/uL (ref 0.0–0.5)
Eosinophils Relative: 2 %
HCT: 31.8 % — ABNORMAL LOW (ref 39.0–52.0)
Hemoglobin: 10.5 g/dL — ABNORMAL LOW (ref 13.0–17.0)
Immature Granulocytes: 1 %
Lymphocytes Relative: 21 %
Lymphs Abs: 0.9 10*3/uL (ref 0.7–4.0)
MCH: 32.1 pg (ref 26.0–34.0)
MCHC: 33 g/dL (ref 30.0–36.0)
MCV: 97.2 fL (ref 80.0–100.0)
Monocytes Absolute: 0.3 10*3/uL (ref 0.1–1.0)
Monocytes Relative: 7 %
Neutro Abs: 2.9 10*3/uL (ref 1.7–7.7)
Neutrophils Relative %: 68 %
Platelet Count: 88 10*3/uL — ABNORMAL LOW (ref 150–400)
RBC: 3.27 MIL/uL — ABNORMAL LOW (ref 4.22–5.81)
RDW: 16 % — ABNORMAL HIGH (ref 11.5–15.5)
WBC Count: 4.2 10*3/uL (ref 4.0–10.5)
nRBC: 0 % (ref 0.0–0.2)

## 2021-05-16 MED ORDER — FUROSEMIDE 40 MG PO TABS: 40.0000 mg | ORAL_TABLET | Freq: Every day | ORAL | 1 refills | Status: DC

## 2021-05-16 MED ORDER — SODIUM CHLORIDE 0.9% FLUSH
10.0000 mL | INTRAVENOUS | Status: AC | PRN
  Administered 2021-05-16: 10 mL

## 2021-05-16 MED ORDER — IOHEXOL 350 MG/ML SOLN
100.0000 mL | Freq: Once | INTRAVENOUS | Status: AC | PRN
  Administered 2021-05-16: 75 mL via INTRAVENOUS

## 2021-05-16 MED ORDER — HEPARIN SOD (PORK) LOCK FLUSH 100 UNIT/ML IV SOLN
500.0000 [IU] | Freq: Once | INTRAVENOUS | Status: AC
  Administered 2021-05-16: 500 [IU] via INTRAVENOUS

## 2021-05-16 MED ORDER — HEPARIN SOD (PORK) LOCK FLUSH 100 UNIT/ML IV SOLN
INTRAVENOUS | Status: AC
  Filled 2021-05-16: qty 5

## 2021-05-16 MED ORDER — SODIUM CHLORIDE (PF) 0.9 % IJ SOLN
INTRAMUSCULAR | Status: AC
  Filled 2021-05-16: qty 50

## 2021-05-16 MED ORDER — FUROSEMIDE 40 MG PO TABS
40.0000 mg | ORAL_TABLET | Freq: Every day | ORAL | 1 refills | Status: DC
  Filled 2021-05-16: qty 30, 30d supply, fill #0

## 2021-05-16 NOTE — Progress Notes (Signed)
Hematology and Oncology Follow Up Visit ? ?[name] ?[id number] ?1945-11-24 76 y.o. ?05/16/2021 8:01 AM ? ? ?Principle Diagnosis: 76 year old man with castration-resistant advanced prostate cancer with disease to the bone diagnosed in 2013.  He presented with Gleason score 4 + 3 = 7 and PSA 6.45 in 2003. ? ? ?Prior Therapy:  ?1. He is status post a prostatectomy and lymph node dissection done on November 05, 2001.  ? ?2. S/P 6480 cGy of radiation therapy in 36 fractions between February and April 2005.  ? ?3. He developed advanced disease and was treated with Lupron in 2011.  His PSA nadir down to 4.11. PSA was up to 5.91 in December of 2012 with a testosterone level of 35. His staging workup did not reveal any bony metastasis. ? ?4. Casodex was added to Lupron on 05/2011. This was discontinued in January 2016 due to progression of disease. ? ?5. Zytiga 1000 mg daily started on 05/13/2014.  Therapy discontinued in December 2018 because of progression of disease. Bone scan of the time showed limited metastatic disease. ? ?6.  He is status post radiation to pelvic lymph nodes between October 5 and December 11, 2018.  He received total of 40 Gray in 5 fractions. ? ?7. Xtandi 160 mg daily started in December 2018.  Therapy discontinued in September 2021. ? ?8. Xofigo monthly infusion started in October 2021.  He completed 6 months of therapy. ? ?9. Taxotere chemotherapy 75 mg per metered square every 3 weeks started in November 29, 2020.  He completed 7 cycles of therapy on May 04, 2021.  Therapy discontinued due to patient's preference and toxicities. ? ?Current therapy:  ? ? ? ?Eligard 30 mg every 4 months.  Next Eligard will be given in June 2023. ? ?Xgeva 120 mg given every 4 weeks started in October 2021.  Next injection will be given on May 16, 2021. ? ? ? ?Interim History: Mr. Dave is here for a follow-up visit.  Since the last visit, he reports increasing shortness of breath and dyspnea on exertion.   He had symptoms of orthopnea and lower extremity edema.  He denies any cough or fevers.  He denies any shortness of breath at rest.  He continues to have excessive tearing but no nausea or vomiting. ? ? ? ? ? ? ? ? ? ?Medications: Updated on review. ?Current Outpatient Medications  ?Medication Sig Dispense Refill  ? ALPRAZolam (XANAX) 0.5 MG tablet Take 0.5 mg by mouth 3 (three) times daily as needed for sleep. (Patient not taking: No sig reported)    ? amLODipine (NORVASC) 5 MG tablet amlodipine 5 mg tablet ? Take 1 tablet every day by oral route.    ? Ascorbic Acid (VITAMIN C) 100 MG CHEW Vitamin C ? 1 once a day    ? aspirin EC 81 MG tablet Take 81 mg by mouth daily.    ? Bergamot Oil OIL Bergamot ? Take 1 or 2 capsules daily    ? calcium-vitamin D (OSCAL 500/200 D-3) 500-200 MG-UNIT tablet Take 1 tablet by mouth daily with breakfast. 1 tablet   ? Cholecalciferol (VITAMIN D3) 3000 units TABS cholecalciferol (vitamin D3) 1,000 unit tablet ? 1 tablet orally once a day    ? Leuprolide Acetate (LUPRON DEPOT IM) Inject into the muscle every 4 (four) months.    ? lidocaine-prilocaine (EMLA) cream Apply 1 application topically as needed. 30 g 0  ? Multiple Vitamins-Minerals (MULTIVITAMIN ADULT PO) multivitamin ? 1 tablet orally once  a day    ? NON FORMULARY CBD oil 500 mg ? Take 1 dropperful under tongue in the evening or at bedtime    ? Omega-3 Fatty Acids (FISH OIL) 1000 MG CAPS Fish Oil ? 2 tablet po daily    ? prochlorperazine (COMPAZINE) 10 MG tablet Take 1 tablet (10 mg total) by mouth every 6 (six) hours as needed for nausea or vomiting. 30 tablet 0  ? XTANDI 40 MG tablet TAKE 4 TABLETS (160 MG TOTAL) BY MOUTH DAILY. 120 tablet 0  ? ?No current facility-administered medications for this visit.  ? ? ?Allergies:  ?Allergies  ?Allergen Reactions  ? Penicillins Rash and Other (See Comments)  ?  Has patient had a PCN reaction causing immediate rash, facial/tongue/throat swelling, SOB or lightheadedness with  hypotension: YES ?Has patient had a PCN reaction causing severe rash involving mucus membranes or skin necrosis: YES ?Has patient had a PCN reaction that required hospitalization: NO ?Has patient had a PCN reaction occurring within the last 10 years: NO ?  ? ? ? ? ?Physical Exam: ? ? ? ? ? ? ? ? ? ?Blood pressure 132/71, pulse 81, temperature 97.9 ?F (36.6 ?C), temperature source Tympanic, resp. rate 17, weight 232 lb 8 oz (105.5 kg), SpO2 95 %. ? ? ? ? ? ? ? ? ?ECOG: 1 ? ? ? ?General appearance: Alert, awake without any distress. ?Head: Atraumatic without abnormalities ?Oropharynx: Without any thrush or ulcers. ?Eyes: No scleral icterus. ?Lymph nodes: No lymphadenopathy noted in the cervical, supraclavicular, or axillary nodes ?Heart:regular rate and rhythm, without any murmurs or gallops.   Bilateral edema to the level of the shin. ?Lung: Clear to auscultation without any rhonchi, wheezes.  Decreased breath sounds at the bases. ?Abdomin: Soft, nontender without any shifting dullness or ascites. ?Musculoskeletal: No clubbing or cyanosis. ?Neurological: No motor or sensory deficits. ?Skin: No rashes or lesions. ? ? ? ? ? ? ? ? ? ? ? ? ? ? ? ? ? ? ? ? ?Lab Results: ?Lab Results  ?Component Value Date  ? WBC 5.7 04/25/2021  ? HGB 9.6 (L) 04/25/2021  ? HCT 29.6 (L) 04/25/2021  ? MCV 99.7 04/25/2021  ? PLT 90 (L) 04/25/2021  ? ?  Chemistry   ?   ?Component Value Date/Time  ? NA 139 04/25/2021 0816  ? NA 141 02/01/2017 0749  ? K 4.4 04/25/2021 0816  ? K 3.5 02/01/2017 0749  ? CL 106 04/25/2021 0816  ? CL 104 06/11/2012 0817  ? CO2 29 04/25/2021 0816  ? CO2 26 02/01/2017 0749  ? BUN 17 04/25/2021 0816  ? BUN 14.5 02/01/2017 0749  ? CREATININE 0.82 04/25/2021 0816  ? CREATININE 0.9 02/01/2017 0749  ?    ?Component Value Date/Time  ? CALCIUM 8.2 (L) 04/25/2021 0816  ? CALCIUM 9.2 02/01/2017 0749  ? ALKPHOS 44 04/25/2021 0816  ? ALKPHOS 70 02/01/2017 0749  ? AST 15 04/25/2021 0816  ? AST 17 02/01/2017 0749  ? ALT 11  04/25/2021 0816  ? ALT 14 02/01/2017 0749  ? BILITOT 0.5 04/25/2021 0816  ? BILITOT 0.95 02/01/2017 0749  ?  ? ? ? ? Latest Reference Range & Units 04/04/21 07:58 04/25/21 08:16  ?Prostate Specific Ag, Serum 0.0 - 4.0 ng/mL 811.0 (H) 766.0 (H)  ?(H): Data is abnormally high ? ? ? ?Impression and Plan: ? ?76 year old man with: ? ?1.  Castration-resistant advanced prostate cancer with disease to the bone and lymphadenopathy since 2013.   ? ?  His disease status was updated at this time and treatment choices were reviewed.  He completed 7 cycles of Taxotere with few complications including excessive fatigue and mild neuropathy.  Risks and benefits of continuing chemotherapy versus treatment break were discussed and he opted to have a treatment holiday.  His PSA is overall stable and different salvage therapy will be instituted in the future.  He is options including Pluvicto versus Jevtana. ? ?For the time being we continue to hold off treatment. ? ? ? ? ?2. Androgen deprivation.  He is currently on Eligard which will be continued indefinitely.  Next injection will be in June 2023. ? ?3.  Bone directed therapy: He will receive Xgeva today and repeated in 6 weeks.  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  We will defer Xgeva at this time given his ongoing symptoms for the next 3 weeks. ? ?4.  Shortness of breath: Etiology related to possible fluid overload versus pulmonary embolism.  We will obtain CT scan of the chest urgently to evaluate that.  Risks and benefits of restarting coagulation with Xarelto were discussed.  He has been anticoagulated in the past and has no objections to it.  I will also start Lasix given his lower extremity edema. ? ?5.  Prognosis and goals of care: Aggressive measures are warranted at this time given his excellent performance status.  His disease is incurable. ? ?6. Follow up: Will be in 3 weeks to follow his progress. ? ?30 minutes were spent on this visit.   The time was dedicated to reviewing laboratory data, disease status update and outlining future plan of care discussion. ? ? ?Zola Button, MD ?3/21/20238:01 AM  ?

## 2021-05-16 NOTE — Telephone Encounter (Signed)
PC to patient's wife, Mickel Baas - informed her patient's appointment for thoracentesis is 05/17/21 @ 1:30 at [facility], they should be there to register at 1:15.  She verbalizes understanding. ?

## 2021-05-16 NOTE — Addendum Note (Signed)
Addended by: [name] on: 05/16/2021 10:37 AM ? ? Modules accepted: Orders ? ?

## 2021-05-16 NOTE — Progress Notes (Signed)
Patient returned from CT, per Dr. Alen Blew, informed patient & his wife, CT shows no PE but he does have pleural effusion.  Instructed patient to start his lasix today as prescribed.  IR thoracentesis has been ordered.  Patient verbalizes understanding. ?

## 2021-05-17 ENCOUNTER — Ambulatory Visit (HOSPITAL_COMMUNITY)
Admission: RE | Admit: 2021-05-17 | Discharge: 2021-05-17 | Disposition: A | Discharge status: Home / Self Care | Payer: Medicare Other | Source: Ambulatory Visit | Attending: Oncology | Admitting: Oncology

## 2021-05-17 ENCOUNTER — Telehealth: Payer: Self-pay | Admitting: *Deleted

## 2021-05-17 ENCOUNTER — Ambulatory Visit (HOSPITAL_COMMUNITY)
Admission: RE | Admit: 2021-05-17 | Discharge: 2021-05-17 | Disposition: A | Discharge status: Home / Self Care | Payer: Medicare Other | Source: Ambulatory Visit | Attending: Student | Admitting: Student

## 2021-05-17 DIAGNOSIS — C61 Malignant neoplasm of prostate: Secondary | ICD-10-CM | POA: Insufficient documentation

## 2021-05-17 DIAGNOSIS — J90 Pleural effusion, not elsewhere classified: Secondary | ICD-10-CM | POA: Diagnosis not present

## 2021-05-17 LAB — PROSTATE-SPECIFIC AG, SERUM (LABCORP): Prostate Specific Ag, Serum: 888 ng/mL — ABNORMAL HIGH (ref 0.0–4.0)

## 2021-05-17 MED ORDER — LIDOCAINE HCL 1 % IJ SOLN
INTRAMUSCULAR | Status: AC
  Administered 2021-05-17: 10 mL
  Filled 2021-05-17: qty 20

## 2021-05-17 NOTE — Telephone Encounter (Signed)
Per Dr.Shadad, called spouse with message below. Pt spouse verbalized understanding ?

## 2021-05-17 NOTE — Telephone Encounter (Signed)
-----   Message from [name], MD sent at 05/17/2021  9:48 AM EDT ----- ?Please let him know his PSA is up but no changes for now ?

## 2021-05-17 NOTE — Procedures (Signed)
PROCEDURE SUMMARY: ? ?Successful image-guided right thoracentesis. ?Yielded 1.5 L of hazy amber fluid. ?Pt tolerated procedure well. ?No immediate complications. ?EBL = trace  ? ?Specimen was not sent for labs. ?CXR ordered. ? ?Please see imaging section of Epic for full dictation. ? ?Tera Mater PA-C ?05/17/2021 ?2:10 PM ? ? ? ?

## 2021-05-18 ENCOUNTER — Ambulatory Visit: Payer: Medicare Other

## 2021-06-09 ENCOUNTER — Inpatient Hospital Stay: Payer: Medicare Other

## 2021-06-09 ENCOUNTER — Inpatient Hospital Stay: Payer: Medicare Other | Admitting: Oncology

## 2021-06-09 ENCOUNTER — Other Ambulatory Visit: Payer: Self-pay

## 2021-06-09 ENCOUNTER — Inpatient Hospital Stay: Payer: Medicare Other | Attending: Oncology

## 2021-06-09 VITALS — BP 132/64 | HR 81 | Temp 98.1°F | Resp 19 | Ht 75.0 in | Wt 230.2 lb

## 2021-06-09 DIAGNOSIS — Z191 Hormone sensitive malignancy status: Secondary | ICD-10-CM | POA: Diagnosis not present

## 2021-06-09 DIAGNOSIS — R5383 Other fatigue: Secondary | ICD-10-CM | POA: Insufficient documentation

## 2021-06-09 DIAGNOSIS — C7951 Secondary malignant neoplasm of bone: Secondary | ICD-10-CM | POA: Insufficient documentation

## 2021-06-09 DIAGNOSIS — Z7982 Long term (current) use of aspirin: Secondary | ICD-10-CM | POA: Diagnosis not present

## 2021-06-09 DIAGNOSIS — J90 Pleural effusion, not elsewhere classified: Secondary | ICD-10-CM | POA: Insufficient documentation

## 2021-06-09 DIAGNOSIS — C61 Malignant neoplasm of prostate: Secondary | ICD-10-CM | POA: Diagnosis present

## 2021-06-09 DIAGNOSIS — E876 Hypokalemia: Secondary | ICD-10-CM | POA: Insufficient documentation

## 2021-06-09 DIAGNOSIS — Z79899 Other long term (current) drug therapy: Secondary | ICD-10-CM | POA: Insufficient documentation

## 2021-06-09 DIAGNOSIS — R600 Localized edema: Secondary | ICD-10-CM | POA: Diagnosis not present

## 2021-06-09 DIAGNOSIS — R5381 Other malaise: Secondary | ICD-10-CM | POA: Diagnosis not present

## 2021-06-09 DIAGNOSIS — Z95828 Presence of other vascular implants and grafts: Secondary | ICD-10-CM

## 2021-06-09 LAB — CBC WITH DIFFERENTIAL (CANCER CENTER ONLY)
Abs Immature Granulocytes: 0.01 10*3/uL (ref 0.00–0.07)
Basophils Absolute: 0 10*3/uL (ref 0.0–0.1)
Basophils Relative: 0 %
Eosinophils Absolute: 0.1 10*3/uL (ref 0.0–0.5)
Eosinophils Relative: 1 %
HCT: 33.3 % — ABNORMAL LOW (ref 39.0–52.0)
Hemoglobin: 10.8 g/dL — ABNORMAL LOW (ref 13.0–17.0)
Immature Granulocytes: 0 %
Lymphocytes Relative: 29 %
Lymphs Abs: 1.4 10*3/uL (ref 0.7–4.0)
MCH: 30.7 pg (ref 26.0–34.0)
MCHC: 32.4 g/dL (ref 30.0–36.0)
MCV: 94.6 fL (ref 80.0–100.0)
Monocytes Absolute: 0.4 10*3/uL (ref 0.1–1.0)
Monocytes Relative: 8 %
Neutro Abs: 2.9 10*3/uL (ref 1.7–7.7)
Neutrophils Relative %: 62 %
Platelet Count: 124 10*3/uL — ABNORMAL LOW (ref 150–400)
RBC: 3.52 MIL/uL — ABNORMAL LOW (ref 4.22–5.81)
RDW: 14.9 % (ref 11.5–15.5)
WBC Count: 4.8 10*3/uL (ref 4.0–10.5)
nRBC: 0 % (ref 0.0–0.2)

## 2021-06-09 LAB — CMP (CANCER CENTER ONLY)
ALT: 8 U/L (ref 0–44)
AST: 13 U/L — ABNORMAL LOW (ref 15–41)
Albumin: 3.4 g/dL — ABNORMAL LOW (ref 3.5–5.0)
Alkaline Phosphatase: 57 U/L (ref 38–126)
Anion gap: 2 — ABNORMAL LOW (ref 5–15)
BUN: 18 mg/dL (ref 8–23)
CO2: 32 mmol/L (ref 22–32)
Calcium: 8.5 mg/dL — ABNORMAL LOW (ref 8.9–10.3)
Chloride: 103 mmol/L (ref 98–111)
Creatinine: 0.79 mg/dL (ref 0.61–1.24)
GFR, Estimated: >60 mL/min (ref >60)
Glucose, Bld: 97 mg/dL (ref 70–99)
Potassium: 4.7 mmol/L (ref 3.5–5.1)
Sodium: 137 mmol/L (ref 135–145)
Total Bilirubin: 0.4 mg/dL (ref 0.3–1.2)
Total Protein: 6.5 g/dL (ref 6.5–8.1)

## 2021-06-09 MED ORDER — FUROSEMIDE 40 MG PO TABS: 40.0000 mg | ORAL_TABLET | Freq: Two times a day (BID) | ORAL | 1 refills | Status: DC

## 2021-06-09 MED ORDER — SODIUM CHLORIDE 0.9% FLUSH
10.0000 mL | INTRAVENOUS | Status: AC | PRN
  Administered 2021-06-09: 10 mL

## 2021-06-09 MED ORDER — HEPARIN SOD (PORK) LOCK FLUSH 100 UNIT/ML IV SOLN
500.0000 [IU] | INTRAVENOUS | Status: AC | PRN
  Administered 2021-06-09: 500 [IU]

## 2021-06-09 NOTE — Progress Notes (Signed)
Hematology and Oncology Follow Up Visit ? ?[name] ?[id number] ?05-Feb-1946 76 y.o. ?06/09/2021 11:49 AM ? ? ?Principle Diagnosis: 76 year old man with advanced prostate cancer with disease to the bone diagnosed in 2013.  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 ? ? ?Prior Therapy:  ?1. He is status post a prostatectomy and lymph node dissection done on November 05, 2001.  ? ?2. S/P 6480 cGy of radiation therapy in 36 fractions between February and April 2005.  ? ?3. He developed advanced disease and was treated with Lupron in 2011.  His PSA nadir down to 4.11. PSA was up to 5.91 in December of 2012 with a testosterone level of 35. His staging workup did not reveal any bony metastasis. ? ?4. Casodex was added to Lupron on 05/2011. This was discontinued in January 2016 due to progression of disease. ? ?5. Zytiga 1000 mg daily started on 05/13/2014.  Therapy discontinued in December 2018 because of progression of disease. Bone scan of the time showed limited metastatic disease. ? ?6.  He is status post radiation to pelvic lymph nodes between October 5 and December 11, 2018.  He received total of 40 Gray in 5 fractions. ? ?7. Xtandi 160 mg daily started in December 2018.  Therapy discontinued in September 2021. ? ?8. Xofigo monthly infusion started in October 2021.  He completed 6 months of therapy. ? ?9. Taxotere chemotherapy 75 mg per metered square every 3 weeks started in November 29, 2020.  He completed 7 cycles of therapy on May 04, 2021.  Therapy discontinued due to patient's preference and toxicities. ? ?Current therapy:  ? ? ? ?Eligard 30 mg every 4 months.  Next Eligard will be given in June 2023. ? ?Xgeva 120 mg given every 4 weeks started in October 2021.  Last injection given on April 04, 2021. ? ? ? ?Interim History: [name] returns today for repeat evaluation.  Since last visit, he underwent thoracentesis and found to have 1.5 L right  side.  Since his procedure, he reports improvement in his respiratory status although he is quite debilitated and fatigued.  He does report dyspnea on exertion still.  He continues to have bilateral lower extremity edema at this time.  He is taking Lasix 40 mg daily although the swelling persisted at this time.  He denies any nausea, vomiting or abdominal pain.  He denies any hospitalizations or illnesses.  He is still able to ambulate short distances. ? ? ? ? ? ? ? ? ? ?Medications: Reviewed without changes. ?Current Outpatient Medications  ?Medication Sig Dispense Refill  ? ALPRAZolam (XANAX) 0.5 MG tablet Take 0.5 mg by mouth 3 (three) times daily as needed for sleep. (Patient not taking: No sig reported)    ? amLODipine (NORVASC) 5 MG tablet amlodipine 5 mg tablet ? Take 1 tablet every day by oral route.    ? Ascorbic Acid (VITAMIN C) 100 MG CHEW Vitamin C ? 1 once a day    ? aspirin EC 81 MG tablet Take 81 mg by mouth daily.    ? Bergamot Oil OIL Bergamot ? Take 1 or 2 capsules daily    ? calcium-vitamin D (OSCAL 500/200 D-3) 500-200 MG-UNIT tablet Take 1 tablet by mouth daily with breakfast. 1 tablet   ? Cholecalciferol (VITAMIN D3) 3000 units TABS cholecalciferol (vitamin D3) 1,000 unit tablet ? 1 tablet orally once a day    ? furosemide (LASIX) 40  MG tablet Take 1 tablet (40 mg total) by mouth daily. 30 tablet 1  ? Leuprolide Acetate (LUPRON DEPOT IM) Inject into the muscle every 4 (four) months.    ? lidocaine-prilocaine (EMLA) cream Apply 1 application topically as needed. 30 g 0  ? Multiple Vitamins-Minerals (MULTIVITAMIN ADULT PO) multivitamin ? 1 tablet orally once a day    ? NON FORMULARY CBD oil 500 mg ? Take 1 dropperful under tongue in the evening or at bedtime    ? Omega-3 Fatty Acids (FISH OIL) 1000 MG CAPS Fish Oil ? 2 tablet po daily    ? prochlorperazine (COMPAZINE) 10 MG tablet Take 1 tablet (10 mg total) by mouth every 6 (six) hours as needed for nausea or vomiting. 30 tablet 0  ? XTANDI 40  MG tablet TAKE 4 TABLETS (160 MG TOTAL) BY MOUTH DAILY. 120 tablet 0  ? ?No current facility-administered medications for this visit.  ? ? ?Allergies:  ?Allergies  ?Allergen Reactions  ? Penicillins Rash and Other (See Comments)  ?  Has patient had a PCN reaction causing immediate rash, facial/tongue/throat swelling, SOB or lightheadedness with hypotension: YES ?Has patient had a PCN reaction causing severe rash involving mucus membranes or skin necrosis: YES ?Has patient had a PCN reaction that required hospitalization: NO ?Has patient had a PCN reaction occurring within the last 10 years: NO ?  ? ? ? ? ?Physical Exam: ? ? ?Blood pressure 132/64, pulse 81, temperature 98.1 ?F (36.7 ?C), temperature source Axillary, resp. rate 19, height '6\' 3"'$  (1.905 m), weight 230 lb 3.2 oz (104.4 kg), SpO2 97 %. ? ? ? ? ? ? ?ECOG: 1 ? ? ?General appearance: Comfortable appearing without any discomfort ?Head: Normocephalic without any trauma ?Oropharynx: Mucous membranes are moist and pink without any thrush or ulcers. ?Eyes: Pupils are equal and round reactive to light. ?Lymph nodes: No cervical, supraclavicular, inguinal or axillary lymphadenopathy.   ?Heart:regular rate and rhythm.  S1 and S2.  Bilateral lower extremity edema noted.  2+ at the level of the shin ?Lung: Clear without any rhonchi or wheezes.  No dullness to percussion. ?Abdomin: Soft, nontender, nondistended with good bowel sounds.  No hepatosplenomegaly. ?Musculoskeletal: No joint deformity or effusion.  Full range of motion noted. ?Neurological: No deficits noted on motor, sensory and deep tendon reflex exam. ?Skin: No petechial rash or dryness.  Appeared moist.  ? ? ? ? ? ? ? ? ? ? ? ? ? ? ? ? ? ? ? ? ?Lab Results: ?Lab Results  ?Component Value Date  ? WBC 4.2 05/16/2021  ? HGB 10.5 (L) 05/16/2021  ? HCT 31.8 (L) 05/16/2021  ? MCV 97.2 05/16/2021  ? PLT 88 (L) 05/16/2021  ? ?  Chemistry   ?   ?Component Value Date/Time  ? NA 138 05/16/2021 0809  ? NA 141  02/01/2017 0749  ? K 4.2 05/16/2021 0809  ? K 3.5 02/01/2017 0749  ? CL 105 05/16/2021 0809  ? CL 104 06/11/2012 0817  ? CO2 29 05/16/2021 0809  ? CO2 26 02/01/2017 0749  ? BUN 16 05/16/2021 0809  ? BUN 14.5 02/01/2017 0749  ? CREATININE 0.76 05/16/2021 0809  ? CREATININE 0.9 02/01/2017 0749  ?    ?Component Value Date/Time  ? CALCIUM 8.3 (L) 05/16/2021 0809  ? CALCIUM 9.2 02/01/2017 0749  ? ALKPHOS 44 05/16/2021 0809  ? ALKPHOS 70 02/01/2017 0749  ? AST 15 05/16/2021 0809  ? AST 17 02/01/2017 0749  ? ALT 10  05/16/2021 0809  ? ALT 14 02/01/2017 0749  ? BILITOT 0.5 05/16/2021 0809  ? BILITOT 0.95 02/01/2017 0749  ?  ? ? ? Latest Reference Range & Units 05/16/21 08:09  ?Prostate Specific Ag, Serum 0.0 - 4.0 ng/mL 888.0 (H)  ?(H): Data is abnormally high ? ? ? ? ?Impression and Plan: ? ?77 year old man with: ? ?1.  Advanced prostate cancer with lymphadenopathy and bone disease since 2013.  He has castration-resistant currently. ? ?The natural course of this disease and treatment choices were reiterated.  He had a modest response to Taxotere chemotherapy and developed pleural effusion likely related to his treatment.  Alternative treatment options moving forward were reviewed these include Jevtana chemotherapy versus Pluvicto.  After discussion today, we opted to continue active surveillance and labs to recovery postchemotherapy. ? ? ? ? ?2. Androgen deprivation.  He will receive Eligard in June 2023.  ? ?3.  Bone directed therapy: Risks and benefits of receiving Xgeva were discussed at this time.  Complications including hypokalemia and osteonecrosis of the jaw were reiterated.  His calcium remains low and will defer Xgeva for the time being. ? ?4.  Pleural effusion: Related to chemotherapy and less likely related to malignancy.  We will continue to monitor periodically.  His fluid was not analyzed for cytology during the last thoracentesis.  He is still quite dyspneic and we will arrange for another  thoracentesis. ? ?5.  Prognosis and goals of care: Therapy remains palliative although aggressive measures are warranted at this time. ? ?6.  Lower extremity edema: I recommended increasing his Lasix to 40 mg twice daily.  We wi

## 2021-06-10 LAB — PROSTATE-SPECIFIC AG, SERUM (LABCORP): Prostate Specific Ag, Serum: 1170 ng/mL — ABNORMAL HIGH (ref 0.0–4.0)

## 2021-06-12 ENCOUNTER — Telehealth: Payer: Self-pay

## 2021-06-12 NOTE — Telephone Encounter (Signed)
-----   Message from [name], MD sent at 06/12/2021  9:21 AM EDT ----- ?Please let him know his PSA is up but no changes for now like we discussed.  ?

## 2021-06-12 NOTE — Telephone Encounter (Signed)
Called pt to relay previous message from Dr. Alen Blew regarding PSA results. ?

## 2021-06-14 ENCOUNTER — Telehealth: Payer: Self-pay | Admitting: Oncology

## 2021-06-14 NOTE — Telephone Encounter (Signed)
Scheduled per 04/14 los, patient has been called and notified. ?

## 2021-06-16 ENCOUNTER — Ambulatory Visit (HOSPITAL_COMMUNITY)
Admission: RE | Admit: 2021-06-16 | Discharge: 2021-06-16 | Disposition: A | Discharge status: Home / Self Care | Payer: Medicare Other | Source: Ambulatory Visit | Attending: Radiology | Admitting: Radiology

## 2021-06-16 ENCOUNTER — Ambulatory Visit (HOSPITAL_COMMUNITY)
Admission: RE | Admit: 2021-06-16 | Discharge: 2021-06-16 | Disposition: A | Discharge status: Home / Self Care | Payer: Medicare Other | Source: Ambulatory Visit | Attending: Oncology | Admitting: Oncology

## 2021-06-16 DIAGNOSIS — J90 Pleural effusion, not elsewhere classified: Secondary | ICD-10-CM | POA: Diagnosis present

## 2021-06-16 DIAGNOSIS — C61 Malignant neoplasm of prostate: Secondary | ICD-10-CM

## 2021-06-16 MED ORDER — LIDOCAINE HCL 1 % IJ SOLN
INTRAMUSCULAR | Status: AC
  Administered 2021-06-16: 15 mL
  Filled 2021-06-16: qty 20

## 2021-06-16 NOTE — Procedures (Signed)
Ultrasound-guided diagnostic and therapeutic right thoracentesis performed yielding 1.6 liters of amber/blood-tinged  fluid. No immediate complications. Follow-up chest x-ray pending. The fluid was sent to the lab for cytology. EBL none. Pt had right > left pleural effusions by [place] today.      ? ?

## 2021-06-21 LAB — CYTOLOGY - NON PAP

## 2021-06-30 ENCOUNTER — Encounter: Payer: Self-pay | Admitting: Oncology

## 2021-06-30 DIAGNOSIS — Z95828 Presence of other vascular implants and grafts: Secondary | ICD-10-CM | POA: Insufficient documentation

## 2021-07-03 ENCOUNTER — Inpatient Hospital Stay: Payer: Medicare Other | Admitting: Oncology

## 2021-07-03 ENCOUNTER — Inpatient Hospital Stay: Payer: Medicare Other | Attending: Oncology

## 2021-07-03 ENCOUNTER — Inpatient Hospital Stay: Payer: Medicare Other

## 2021-07-03 ENCOUNTER — Other Ambulatory Visit: Payer: Self-pay

## 2021-07-03 VITALS — BP 115/61 | HR 78 | Temp 97.8°F | Resp 18 | Ht 75.0 in | Wt 216.3 lb

## 2021-07-03 DIAGNOSIS — J90 Pleural effusion, not elsewhere classified: Secondary | ICD-10-CM | POA: Insufficient documentation

## 2021-07-03 DIAGNOSIS — Z923 Personal history of irradiation: Secondary | ICD-10-CM | POA: Diagnosis not present

## 2021-07-03 DIAGNOSIS — C61 Malignant neoplasm of prostate: Secondary | ICD-10-CM

## 2021-07-03 DIAGNOSIS — C7951 Secondary malignant neoplasm of bone: Secondary | ICD-10-CM | POA: Insufficient documentation

## 2021-07-03 DIAGNOSIS — Z79899 Other long term (current) drug therapy: Secondary | ICD-10-CM | POA: Diagnosis not present

## 2021-07-03 DIAGNOSIS — Z7982 Long term (current) use of aspirin: Secondary | ICD-10-CM | POA: Insufficient documentation

## 2021-07-03 DIAGNOSIS — Z95828 Presence of other vascular implants and grafts: Secondary | ICD-10-CM

## 2021-07-03 DIAGNOSIS — Z9221 Personal history of antineoplastic chemotherapy: Secondary | ICD-10-CM | POA: Diagnosis not present

## 2021-07-03 DIAGNOSIS — Z192 Hormone resistant malignancy status: Secondary | ICD-10-CM | POA: Insufficient documentation

## 2021-07-03 DIAGNOSIS — R600 Localized edema: Secondary | ICD-10-CM | POA: Diagnosis not present

## 2021-07-03 LAB — CBC WITH DIFFERENTIAL (CANCER CENTER ONLY)
Abs Immature Granulocytes: 0.01 10*3/uL (ref 0.00–0.07)
Basophils Absolute: 0 10*3/uL (ref 0.0–0.1)
Basophils Relative: 0 %
Eosinophils Absolute: 0.2 10*3/uL (ref 0.0–0.5)
Eosinophils Relative: 4 %
HCT: 33.3 % — ABNORMAL LOW (ref 39.0–52.0)
Hemoglobin: 10.7 g/dL — ABNORMAL LOW (ref 13.0–17.0)
Immature Granulocytes: 0 %
Lymphocytes Relative: 25 %
Lymphs Abs: 1.2 10*3/uL (ref 0.7–4.0)
MCH: 29.7 pg (ref 26.0–34.0)
MCHC: 32.1 g/dL (ref 30.0–36.0)
MCV: 92.5 fL (ref 80.0–100.0)
Monocytes Absolute: 0.3 10*3/uL (ref 0.1–1.0)
Monocytes Relative: 7 %
Neutro Abs: 3 10*3/uL (ref 1.7–7.7)
Neutrophils Relative %: 64 %
Platelet Count: 127 10*3/uL — ABNORMAL LOW (ref 150–400)
RBC: 3.6 MIL/uL — ABNORMAL LOW (ref 4.22–5.81)
RDW: 15 % (ref 11.5–15.5)
WBC Count: 4.7 10*3/uL (ref 4.0–10.5)
nRBC: 0 % (ref 0.0–0.2)

## 2021-07-03 LAB — CMP (CANCER CENTER ONLY)
ALT: 7 U/L (ref 0–44)
AST: 12 U/L — ABNORMAL LOW (ref 15–41)
Albumin: 3.3 g/dL — ABNORMAL LOW (ref 3.5–5.0)
Alkaline Phosphatase: 63 U/L (ref 38–126)
Anion gap: 5 (ref 5–15)
BUN: 25 mg/dL — ABNORMAL HIGH (ref 8–23)
CO2: 30 mmol/L (ref 22–32)
Calcium: 8.3 mg/dL — ABNORMAL LOW (ref 8.9–10.3)
Chloride: 103 mmol/L (ref 98–111)
Creatinine: 0.94 mg/dL (ref 0.61–1.24)
GFR, Estimated: >60 mL/min (ref >60)
Glucose, Bld: 151 mg/dL — ABNORMAL HIGH (ref 70–99)
Potassium: 4.2 mmol/L (ref 3.5–5.1)
Sodium: 138 mmol/L (ref 135–145)
Total Bilirubin: 0.4 mg/dL (ref 0.3–1.2)
Total Protein: 6.6 g/dL (ref 6.5–8.1)

## 2021-07-03 MED ORDER — HEPARIN SOD (PORK) LOCK FLUSH 100 UNIT/ML IV SOLN
500.0000 [IU] | Freq: Once | INTRAVENOUS | Status: AC
  Administered 2021-07-03: 500 [IU]

## 2021-07-03 MED ORDER — SODIUM CHLORIDE 0.9% FLUSH
10.0000 mL | Freq: Once | INTRAVENOUS | Status: AC
  Administered 2021-07-03: 10 mL

## 2021-07-03 NOTE — Progress Notes (Signed)
Hematology and Oncology Follow Up Visit ? ?[name] ?[id number] ?20-Jul-1945 76 y.o. ?07/03/2021 8:23 AM ? ? ?Principle Diagnosis: 76 year old man with castration-resistant advanced prostate cancer with disease to the bone diagnosed in 2013.  He was found to have Gleason score 4 + 3 = 7 and PSA 6.45 on initial presentation in 2003. ? ? ?Prior Therapy:  ?1. He is status post a prostatectomy and lymph node dissection done on November 05, 2001.  ? ?2. S/P 6480 cGy of radiation therapy in 36 fractions between February and April 2005.  ? ?3. He developed advanced disease and was treated with Lupron in 2011.  His PSA nadir down to 4.11. PSA was up to 5.91 in December of 2012 with a testosterone level of 35. His staging workup did not reveal any bony metastasis. ? ?4. Casodex was added to Lupron on 05/2011. This was discontinued in January 2016 due to progression of disease. ? ?5. Zytiga 1000 mg daily started on 05/13/2014.  Therapy discontinued in December 2018 because of progression of disease. Bone scan of the time showed limited metastatic disease. ? ?6.  He is status post radiation to pelvic lymph nodes between October 5 and December 11, 2018.  He received total of 40 Gray in 5 fractions. ? ?7. Xtandi 160 mg daily started in December 2018.  Therapy discontinued in September 2021. ? ?8. Xofigo monthly infusion started in October 2021.  He completed 6 months of therapy. ? ?9. Taxotere chemotherapy 75 mg per metered square every 3 weeks started in November 29, 2020.  He completed 7 cycles of therapy on May 04, 2021.  Therapy discontinued due to patient's preference and toxicities. ? ?Current therapy:  ? ? ? ?Eligard 30 mg every 4 months.  Next Eligard will be given in June 2023. ? ?Xgeva 120 mg given every 4 weeks started in October 2021.  Last treatment given in February 2023. ? ? ? ?Interim History: [name] is here for a follow-up visit.  He reports a slight improvement in his overall health and performance  status.  He is able to walk around the block a lot more easier than previous times.  He still has lower extremity edema which is improved slightly.  He denies any shortness of breath or difficulty breathing.  He denies any dyspnea on exertion.  He denies any bone pain or pathological fractures.  He denies any hospitalizations or illnesses. ? ? ? ? ? ? ? ? ? ?Medications: Reviewed without changes. ?Current Outpatient Medications  ?Medication Sig Dispense Refill  ? ALPRAZolam (XANAX) 0.5 MG tablet Take 0.5 mg by mouth 3 (three) times daily as needed for sleep. (Patient not taking: No sig reported)    ? amLODipine (NORVASC) 5 MG tablet amlodipine 5 mg tablet ? Take 1 tablet every day by oral route.    ? Ascorbic Acid (VITAMIN C) 100 MG CHEW Vitamin C ? 1 once a day    ? aspirin EC 81 MG tablet Take 81 mg by mouth daily.    ? Bergamot Oil OIL Bergamot ? Take 1 or 2 capsules daily    ? calcium-vitamin D (OSCAL 500/200 D-3) 500-200 MG-UNIT tablet Take 1 tablet by mouth daily with breakfast. 1 tablet   ? Cholecalciferol (VITAMIN D3) 3000 units TABS cholecalciferol (vitamin D3) 1,000 unit tablet ? 1 tablet orally once a day    ? furosemide (LASIX) 40 MG tablet Take 1 tablet (40 mg total) by mouth 2 (two) times daily. 60 tablet 1  ?  Leuprolide Acetate (LUPRON DEPOT IM) Inject into the muscle every 4 (four) months.    ? lidocaine-prilocaine (EMLA) cream Apply 1 application topically as needed. 30 g 0  ? Multiple Vitamins-Minerals (MULTIVITAMIN ADULT PO) multivitamin ? 1 tablet orally once a day    ? NON FORMULARY CBD oil 500 mg ? Take 1 dropperful under tongue in the evening or at bedtime    ? Omega-3 Fatty Acids (FISH OIL) 1000 MG CAPS Fish Oil ? 2 tablet po daily    ? prochlorperazine (COMPAZINE) 10 MG tablet Take 1 tablet (10 mg total) by mouth every 6 (six) hours as needed for nausea or vomiting. 30 tablet 0  ? XTANDI 40 MG tablet TAKE 4 TABLETS (160 MG TOTAL) BY MOUTH DAILY. 120 tablet 0  ? ?No current  facility-administered medications for this visit.  ? ? ?Allergies:  ?Allergies  ?Allergen Reactions  ? Penicillins Rash and Other (See Comments)  ?  Has patient had a PCN reaction causing immediate rash, facial/tongue/throat swelling, SOB or lightheadedness with hypotension: YES ?Has patient had a PCN reaction causing severe rash involving mucus membranes or skin necrosis: YES ?Has patient had a PCN reaction that required hospitalization: NO ?Has patient had a PCN reaction occurring within the last 10 years: NO ?  ? ? ? ? ?Physical Exam: ? ? ? ? ? ?Blood pressure 115/61, pulse 78, temperature 97.8 ?F (36.6 ?C), temperature source Axillary, resp. rate 18, height '6\' 3"'$  (5.329 m), weight 216 lb 4.8 oz (98.1 kg), SpO2 97 %. ? ? ? ? ?ECOG: 1 ? ? ? ?General appearance: Alert, awake without any distress. ?Head: Atraumatic without abnormalities ?Oropharynx: Without any thrush or ulcers. ?Eyes: No scleral icterus. ?Lymph nodes: No lymphadenopathy noted in the cervical, supraclavicular, or axillary nodes ?Heart:regular rate and rhythm, without any murmurs or gallops.  1+ edema bilaterally at the ankle. ?Lung: Clear to auscultation without any rhonchi, wheezes or dullness to percussion. ?Abdomin: Soft, nontender without any shifting dullness or ascites. ?Musculoskeletal: No clubbing or cyanosis. ?Neurological: No motor or sensory deficits. ?Skin: No rashes or lesions. ? ? ? ? ? ? ? ? ? ? ? ? ? ? ? ? ? ? ? ? ?Lab Results: ?Lab Results  ?Component Value Date  ? WBC 4.8 06/09/2021  ? HGB 10.8 (L) 06/09/2021  ? HCT 33.3 (L) 06/09/2021  ? MCV 94.6 06/09/2021  ? PLT 124 (L) 06/09/2021  ? ?  Chemistry   ?   ?Component Value Date/Time  ? NA 137 06/09/2021 1154  ? NA 141 02/01/2017 0749  ? K 4.7 06/09/2021 1154  ? K 3.5 02/01/2017 0749  ? CL 103 06/09/2021 1154  ? CL 104 06/11/2012 0817  ? CO2 32 06/09/2021 1154  ? CO2 26 02/01/2017 0749  ? BUN 18 06/09/2021 1154  ? BUN 14.5 02/01/2017 0749  ? CREATININE 0.79 06/09/2021 1154  ?  CREATININE 0.9 02/01/2017 0749  ?    ?Component Value Date/Time  ? CALCIUM 8.5 (L) 06/09/2021 1154  ? CALCIUM 9.2 02/01/2017 0749  ? ALKPHOS 57 06/09/2021 1154  ? ALKPHOS 70 02/01/2017 0749  ? AST 13 (L) 06/09/2021 1154  ? AST 17 02/01/2017 0749  ? ALT 8 06/09/2021 1154  ? ALT 14 02/01/2017 0749  ? BILITOT 0.4 06/09/2021 1154  ? BILITOT 0.95 02/01/2017 0749  ?  ? ? ? ? Latest Reference Range & Units 05/16/21 08:09 06/09/21 11:54  ?Prostate Specific Ag, Serum 0.0 - 4.0 ng/mL 888.0 (H) 1,170.0 (H)  ?(  H): Data is abnormally high ? ? ? ?Impression and Plan: ? ?76 year old man with: ? ?1.  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   ? ?His disease status was updated at this time and treatment choices were reviewed.  His performance status has been affected by the previous chemotherapy regimen and still slowly recovering.  Retreatment with chemotherapy utilizing Christinia Gully was discussed today and has been deferred.  The rise in the PSA is certainly concerning but given his overall performance status and quality of life will defer the option to start salvage therapy for the time being. ? ? ? ? ?2. Androgen deprivation.  He is currently on Eligard which will be continued indefinitely.  Next injection will be in June 2023. ? ?3.  Bone directed therapy: I recommended increasing calcium supplements and will receive Xgeva with the next visit. ? ?4.  Pleural effusion: Related to Taxotere chemotherapy and less likely malignant effusion.  Cytology did not reveal any cancer cells at this time.  We will continue to monitor. ? ?5.  Prognosis and goals of care: His disease is incurable and any treatment is palliative at this time.  His performance status is marginal and for the time being we deferred any additional treatment. ? ?6.  Lower extremity edema: Related to Taxotere chemotherapy and currently on Lasix. ? ?7. Follow up: In the next 4 to 6 weeks for repeat follow-up. ? ?30  minutes were spent on this encounter.  The time was dedicated to reviewing laboratory data, disease status update and outlining future plan of care discussion. ? ? ?Zola Button, MD ?5/8/20238:23 AM  ?

## 2021-07-04 LAB — PROSTATE-SPECIFIC AG, SERUM (LABCORP): Prostate Specific Ag, Serum: 1493 ng/mL — ABNORMAL HIGH (ref 0.0–4.0)

## 2021-07-14 ENCOUNTER — Telehealth: Payer: Self-pay | Admitting: Oncology

## 2021-07-14 NOTE — Telephone Encounter (Signed)
Called patient regarding upcoming June appointments, patient is notified.  

## 2021-08-14 ENCOUNTER — Other Ambulatory Visit: Payer: Self-pay

## 2021-08-14 ENCOUNTER — Inpatient Hospital Stay: Payer: Medicare Other | Attending: Oncology

## 2021-08-14 ENCOUNTER — Inpatient Hospital Stay: Payer: Medicare Other | Admitting: Oncology

## 2021-08-14 ENCOUNTER — Inpatient Hospital Stay: Payer: Medicare Other

## 2021-08-14 VITALS — BP 121/60 | HR 72 | Temp 97.5°F | Resp 20 | Wt 215.5 lb

## 2021-08-14 DIAGNOSIS — J90 Pleural effusion, not elsewhere classified: Secondary | ICD-10-CM | POA: Diagnosis not present

## 2021-08-14 DIAGNOSIS — Z95828 Presence of other vascular implants and grafts: Secondary | ICD-10-CM

## 2021-08-14 DIAGNOSIS — C61 Malignant neoplasm of prostate: Secondary | ICD-10-CM | POA: Insufficient documentation

## 2021-08-14 DIAGNOSIS — G629 Polyneuropathy, unspecified: Secondary | ICD-10-CM | POA: Diagnosis not present

## 2021-08-14 DIAGNOSIS — C7951 Secondary malignant neoplasm of bone: Secondary | ICD-10-CM | POA: Insufficient documentation

## 2021-08-14 DIAGNOSIS — Z5111 Encounter for antineoplastic chemotherapy: Secondary | ICD-10-CM | POA: Insufficient documentation

## 2021-08-14 DIAGNOSIS — Z79899 Other long term (current) drug therapy: Secondary | ICD-10-CM | POA: Diagnosis not present

## 2021-08-14 DIAGNOSIS — Z923 Personal history of irradiation: Secondary | ICD-10-CM | POA: Diagnosis not present

## 2021-08-14 DIAGNOSIS — Z7982 Long term (current) use of aspirin: Secondary | ICD-10-CM | POA: Insufficient documentation

## 2021-08-14 LAB — CBC WITH DIFFERENTIAL (CANCER CENTER ONLY)
Abs Immature Granulocytes: 0.02 10*3/uL (ref 0.00–0.07)
Basophils Absolute: 0 10*3/uL (ref 0.0–0.1)
Basophils Relative: 0 %
Eosinophils Absolute: 0.2 10*3/uL (ref 0.0–0.5)
Eosinophils Relative: 3 %
HCT: 32.5 % — ABNORMAL LOW (ref 39.0–52.0)
Hemoglobin: 10.7 g/dL — ABNORMAL LOW (ref 13.0–17.0)
Immature Granulocytes: 0 %
Lymphocytes Relative: 30 %
Lymphs Abs: 1.4 10*3/uL (ref 0.7–4.0)
MCH: 29.8 pg (ref 26.0–34.0)
MCHC: 32.9 g/dL (ref 30.0–36.0)
MCV: 90.5 fL (ref 80.0–100.0)
Monocytes Absolute: 0.4 10*3/uL (ref 0.1–1.0)
Monocytes Relative: 8 %
Neutro Abs: 2.6 10*3/uL (ref 1.7–7.7)
Neutrophils Relative %: 59 %
Platelet Count: 125 10*3/uL — ABNORMAL LOW (ref 150–400)
RBC: 3.59 MIL/uL — ABNORMAL LOW (ref 4.22–5.81)
RDW: 16.3 % — ABNORMAL HIGH (ref 11.5–15.5)
WBC Count: 4.5 10*3/uL (ref 4.0–10.5)
nRBC: 0 % (ref 0.0–0.2)

## 2021-08-14 LAB — CMP (CANCER CENTER ONLY)
ALT: 7 U/L (ref 0–44)
AST: 13 U/L — ABNORMAL LOW (ref 15–41)
Albumin: 3.4 g/dL — ABNORMAL LOW (ref 3.5–5.0)
Alkaline Phosphatase: 66 U/L (ref 38–126)
Anion gap: 6 (ref 5–15)
BUN: 22 mg/dL (ref 8–23)
CO2: 30 mmol/L (ref 22–32)
Calcium: 8.9 mg/dL (ref 8.9–10.3)
Chloride: 102 mmol/L (ref 98–111)
Creatinine: 0.96 mg/dL (ref 0.61–1.24)
GFR, Estimated: >60 mL/min (ref >60)
Glucose, Bld: 126 mg/dL — ABNORMAL HIGH (ref 70–99)
Potassium: 4.4 mmol/L (ref 3.5–5.1)
Sodium: 138 mmol/L (ref 135–145)
Total Bilirubin: 0.4 mg/dL (ref 0.3–1.2)
Total Protein: 6.8 g/dL (ref 6.5–8.1)

## 2021-08-14 MED ORDER — SODIUM CHLORIDE 0.9% FLUSH
10.0000 mL | Freq: Once | INTRAVENOUS | Status: AC
  Administered 2021-08-14: 10 mL

## 2021-08-14 MED ORDER — DENOSUMAB 120 MG/1.7ML ~~LOC~~ SOLN
120.0000 mg | Freq: Once | SUBCUTANEOUS | Status: AC
  Administered 2021-08-14: 120 mg via SUBCUTANEOUS
  Filled 2021-08-14: qty 1.7

## 2021-08-14 MED ORDER — HEPARIN SOD (PORK) LOCK FLUSH 100 UNIT/ML IV SOLN
500.0000 [IU] | Freq: Once | INTRAVENOUS | Status: AC
  Administered 2021-08-14: 500 [IU]

## 2021-08-14 MED ORDER — LEUPROLIDE ACETATE (4 MONTH) 30 MG ~~LOC~~ KIT
30.0000 mg | PACK | Freq: Once | SUBCUTANEOUS | Status: AC
  Administered 2021-08-14: 30 mg via SUBCUTANEOUS
  Filled 2021-08-14: qty 30

## 2021-08-14 NOTE — Progress Notes (Signed)
Hematology and Oncology Follow Up Visit  [name] [id number] [birth year] 76 y.o. 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  His Guardant360 analysis did not show NF1 mutation   Prior Therapy:  1. He is status post a prostatectomy and lymph node dissection done on November 05, 2001.   2. S/P 6480 cGy of radiation therapy in 36 fractions between February and April 2005.   3. He developed advanced disease and was treated with Lupron in 2011.  His PSA nadir down to 4.11. PSA was up to 5.91 in December of 2012 with a testosterone level of 35. His staging workup did not reveal any bony metastasis.  4. Casodex was added to Lupron on 05/2011. This was discontinued in January 2016 due to progression of disease.  5. Zytiga 1000 mg daily started on 05/13/2014.  Therapy discontinued in December 2018 because of progression of disease. Bone scan of the time showed limited metastatic disease.  6.  He is status post radiation to pelvic lymph nodes between October 5 and December 11, 2018.  He received total of 40 Gray in 5 fractions.  7. Xtandi 160 mg daily started in December 2018.  Therapy discontinued in September 2021.  8. Xofigo monthly infusion started in October 2021.  He completed 6 months of therapy.  9. Taxotere chemotherapy 75 mg per metered square every 3 weeks started in November 29, 2020.  He completed 7 cycles of therapy on May 04, 2021.  Therapy discontinued due to patient's preference and toxicities.  Current therapy:     Eligard 30 mg every 4 months.  He will receive the next injection today.  Xgeva 120 mg given every 4 weeks started in October 2021.  Last treatment given in February 2023.    Interim History: [name] returns today for a follow-up evaluation.  Since last visit, he reports  continuous improvement in his overall health status.  He still has lower extremity edema but has improved slightly.  He has neuropathy which is affecting his mobility.  He denies any nausea, vomiting or abdominal pain.  He denies any dyspnea on exertion.          Medications: Updated on review. Current Outpatient Medications  Medication Sig Dispense Refill   ALPRAZolam (XANAX) 0.5 MG tablet Take 0.5 mg by mouth 3 (three) times daily as needed for sleep. (Patient not taking: No sig reported)     amLODipine (NORVASC) 5 MG tablet amlodipine 5 mg tablet  Take 1 tablet every day by oral route.     Ascorbic Acid (VITAMIN C) 100 MG CHEW Vitamin C  1 once a day     aspirin EC 81 MG tablet Take 81 mg by mouth daily.     Bergamot Oil OIL Bergamot  Take 1 or 2 capsules daily     calcium-vitamin D (OSCAL 500/200 D-3) 500-200 MG-UNIT tablet Take 1 tablet by mouth daily with breakfast. 1 tablet    Cholecalciferol (VITAMIN D3) 3000 units TABS cholecalciferol (vitamin D3) 1,000 unit tablet  1 tablet orally once a day     furosemide (LASIX) 40 MG tablet Take 1 tablet (40 mg total) by mouth 2 (two) times daily. 60 tablet 1   Leuprolide Acetate (LUPRON DEPOT IM) Inject into the muscle every 4 (four) months.     lidocaine-prilocaine (EMLA) cream  Apply 1 application topically as needed. 30 g 0   Multiple Vitamins-Minerals (MULTIVITAMIN ADULT PO) multivitamin  1 tablet orally once a day     NON FORMULARY CBD oil 500 mg  Take 1 dropperful under tongue in the evening or at bedtime     Omega-3 Fatty Acids (FISH OIL) 1000 MG CAPS Fish Oil  2 tablet po daily     prochlorperazine (COMPAZINE) 10 MG tablet Take 1 tablet (10 mg total) by mouth every 6 (six) hours as needed for nausea or vomiting. 30 tablet 0   XTANDI 40 MG tablet TAKE 4 TABLETS (160 MG TOTAL) BY MOUTH DAILY. 120 tablet 0   No current facility-administered medications for this visit.    Allergies:  Allergies  Allergen Reactions    Penicillins Rash and Other (See Comments)    Has patient had a PCN reaction causing immediate rash, facial/tongue/throat swelling, SOB or lightheadedness with hypotension: YES Has patient had a PCN reaction causing severe rash involving mucus membranes or skin necrosis: YES Has patient had a PCN reaction that required hospitalization: NO Has patient had a PCN reaction occurring within the last 10 years: NO       Physical Exam:     Blood pressure 121/60, pulse 72, temperature (!) 97.5 F (36.4 C), resp. rate 20, weight 215 lb 8 oz (97.8 kg), SpO2 98 %.       ECOG: 1   General appearance: Comfortable appearing without any discomfort Head: Normocephalic without any trauma Oropharynx: Mucous membranes are moist and pink without any thrush or ulcers. Eyes: Pupils are equal and round reactive to light. Lymph nodes: No cervical, supraclavicular, inguinal or axillary lymphadenopathy.   Heart:regular rate and rhythm.  S1 and S2 without leg edema. Lung: Clear without any rhonchi or wheezes.  No dullness to percussion. Abdomin: Soft, nontender, nondistended with good bowel sounds.  No hepatosplenomegaly. Musculoskeletal: No joint deformity or effusion.  Full range of motion noted. Neurological: No deficits noted on motor, sensory and deep tendon reflex exam. Skin: No petechial rash or dryness.  Appeared moist.                     Lab Results: Lab Results  Component Value Date   WBC 4.7 07/03/2021   HGB 10.7 (L) 07/03/2021   HCT 33.3 (L) 07/03/2021   MCV 92.5 07/03/2021   PLT 127 (L) 07/03/2021     Chemistry      Component Value Date/Time   NA 138 07/03/2021 0840   NA 141 02/01/2017 0749   K 4.2 07/03/2021 0840   K 3.5 02/01/2017 0749   CL 103 07/03/2021 0840   CL 104 06/11/2012 0817   CO2 30 07/03/2021 0840   CO2 26 02/01/2017 0749   BUN 25 (H) 07/03/2021 0840   BUN 14.5 02/01/2017 0749   CREATININE 0.94 07/03/2021 0840   CREATININE 0.9 02/01/2017  0749      Component Value Date/Time   CALCIUM 8.3 (L) 07/03/2021 0840   CALCIUM 9.2 02/01/2017 0749   ALKPHOS 63 07/03/2021 0840   ALKPHOS 70 02/01/2017 0749   AST 12 (L) 07/03/2021 0840   AST 17 02/01/2017 0749   ALT 7 07/03/2021 0840   ALT 14 02/01/2017 0749   BILITOT 0.4 07/03/2021 0840   BILITOT 0.95 02/01/2017 0749       Latest Reference Range & Units 03/14/21 07:55 04/04/21 07:58 04/25/21 08:16 05/16/21 08:09 06/09/21 11:54 07/03/21 08:40  Prostate Specific Ag, Serum 0.0 - 4.0  ng/mL 782.0 (H) 811.0 (H) 766.0 (H) 888.0 (H) 1,170.0 (H) 1,493.0 (H)  (H): Data is abnormally high    Impression and Plan:  76 year old man with:  1.  Advanced prostate cancer with disease to the bone diagnosed in 2013.  He has castration-resistant disease at this time.   His disease status was updated and treatment choices were reviewed.  He had poor tolerance and response to chemotherapy and restarting different salvage chemotherapy such as Christinia Gully might be problematic for him.  Treatment options moving forward were discussed including off label trial of everolimus given his NF1 mutation versus best supportive care.  At this time, we opted to continue with best supportive care given his overall willingness to achieve reasonable quality of life additional chemotherapy will interfere with.   2. Androgen deprivation.  He is currently on Eligard which will be repeated today.  Complication including weight gain and hot flashes among others were reviewed.   3.  Bone directed therapy: His calcium is within normal range and will receive Xgeva.  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  4.  Pleural effusion: No evidence of malignancy noted on cytology.  This could be related to Taxotere versus malignancy.  5.  Prognosis and goals of care: His disease is incurable prognosis is overall poor with his decline in pulm status.  6.  Lower extremity edema: Related to  Taxotere chemotherapy and continues to improve.  7. Follow up: In 2 months for repeat follow-up.  30 minutes were dedicated to this visit.  Time spent on reviewing laboratory data, disease status update and outlining future plan of care discussion.   Zola Button, MD 6/19/20238:20 AM

## 2021-08-15 LAB — PROSTATE-SPECIFIC AG, SERUM (LABCORP): Prostate Specific Ag, Serum: 1697 ng/mL — ABNORMAL HIGH (ref 0.0–4.0)

## 2021-08-30 ENCOUNTER — Other Ambulatory Visit: Payer: Self-pay | Admitting: Oncology

## 2021-09-12 ENCOUNTER — Telehealth: Payer: Self-pay | Admitting: Oncology

## 2021-09-12 NOTE — Telephone Encounter (Signed)
Rescheduled August appointments per provider pal, called to inform patient about new upcoming rescheduled appointment. Patient has been called and notified.

## 2021-09-19 ENCOUNTER — Other Ambulatory Visit: Payer: Self-pay | Admitting: Oncology

## 2021-10-18 ENCOUNTER — Ambulatory Visit: Payer: Medicare Other | Admitting: Oncology

## 2021-10-18 ENCOUNTER — Other Ambulatory Visit: Payer: Medicare Other

## 2021-10-23 ENCOUNTER — Inpatient Hospital Stay: Payer: Medicare Other | Attending: Oncology | Admitting: Oncology

## 2021-10-23 ENCOUNTER — Other Ambulatory Visit: Payer: Self-pay

## 2021-10-23 ENCOUNTER — Inpatient Hospital Stay: Payer: Medicare Other

## 2021-10-23 VITALS — BP 126/59 | HR 56 | Temp 97.6°F | Resp 15 | Wt 213.1 lb

## 2021-10-23 DIAGNOSIS — R600 Localized edema: Secondary | ICD-10-CM | POA: Diagnosis not present

## 2021-10-23 DIAGNOSIS — C7951 Secondary malignant neoplasm of bone: Secondary | ICD-10-CM | POA: Diagnosis present

## 2021-10-23 DIAGNOSIS — Z923 Personal history of irradiation: Secondary | ICD-10-CM | POA: Diagnosis not present

## 2021-10-23 DIAGNOSIS — Z79899 Other long term (current) drug therapy: Secondary | ICD-10-CM | POA: Diagnosis not present

## 2021-10-23 DIAGNOSIS — Z95828 Presence of other vascular implants and grafts: Secondary | ICD-10-CM

## 2021-10-23 DIAGNOSIS — C61 Malignant neoplasm of prostate: Secondary | ICD-10-CM

## 2021-10-23 DIAGNOSIS — J90 Pleural effusion, not elsewhere classified: Secondary | ICD-10-CM | POA: Diagnosis not present

## 2021-10-23 LAB — CMP (CANCER CENTER ONLY)
ALT: 8 U/L (ref 0–44)
AST: 14 U/L — ABNORMAL LOW (ref 15–41)
Albumin: 3.6 g/dL (ref 3.5–5.0)
Alkaline Phosphatase: 53 U/L (ref 38–126)
Anion gap: 5 (ref 5–15)
BUN: 27 mg/dL — ABNORMAL HIGH (ref 8–23)
CO2: 32 mmol/L (ref 22–32)
Calcium: 8.5 mg/dL — ABNORMAL LOW (ref 8.9–10.3)
Chloride: 102 mmol/L (ref 98–111)
Creatinine: 0.88 mg/dL (ref 0.61–1.24)
GFR, Estimated: >60 mL/min (ref >60)
Glucose, Bld: 115 mg/dL — ABNORMAL HIGH (ref 70–99)
Potassium: 4.3 mmol/L (ref 3.5–5.1)
Sodium: 139 mmol/L (ref 135–145)
Total Bilirubin: 0.5 mg/dL (ref 0.3–1.2)
Total Protein: 6.4 g/dL — ABNORMAL LOW (ref 6.5–8.1)

## 2021-10-23 LAB — CBC WITH DIFFERENTIAL (CANCER CENTER ONLY)
Abs Immature Granulocytes: 0.01 10*3/uL (ref 0.00–0.07)
Basophils Absolute: 0 10*3/uL (ref 0.0–0.1)
Basophils Relative: 0 %
Eosinophils Absolute: 0.1 10*3/uL (ref 0.0–0.5)
Eosinophils Relative: 3 %
HCT: 31.9 % — ABNORMAL LOW (ref 39.0–52.0)
Hemoglobin: 10.7 g/dL — ABNORMAL LOW (ref 13.0–17.0)
Immature Granulocytes: 0 %
Lymphocytes Relative: 22 %
Lymphs Abs: 0.9 10*3/uL (ref 0.7–4.0)
MCH: 30.9 pg (ref 26.0–34.0)
MCHC: 33.5 g/dL (ref 30.0–36.0)
MCV: 92.2 fL (ref 80.0–100.0)
Monocytes Absolute: 0.3 10*3/uL (ref 0.1–1.0)
Monocytes Relative: 8 %
Neutro Abs: 2.8 10*3/uL (ref 1.7–7.7)
Neutrophils Relative %: 67 %
Platelet Count: 123 10*3/uL — ABNORMAL LOW (ref 150–400)
RBC: 3.46 MIL/uL — ABNORMAL LOW (ref 4.22–5.81)
RDW: 15.9 % — ABNORMAL HIGH (ref 11.5–15.5)
WBC Count: 4.2 10*3/uL (ref 4.0–10.5)
nRBC: 0 % (ref 0.0–0.2)

## 2021-10-23 MED ORDER — HEPARIN SOD (PORK) LOCK FLUSH 100 UNIT/ML IV SOLN
500.0000 [IU] | Freq: Once | INTRAVENOUS | Status: AC
  Administered 2021-10-23: 500 [IU]

## 2021-10-23 MED ORDER — SODIUM CHLORIDE 0.9% FLUSH
10.0000 mL | Freq: Once | INTRAVENOUS | Status: AC
  Administered 2021-10-23: 10 mL

## 2021-10-23 NOTE — Progress Notes (Signed)
Hematology and Oncology Follow Up Visit  [name] [id number] 1945-10-28 76 y.o. 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  He was found to have Gleason score 4 + 3 = 7 and PSA 6.45 with initial diagnosis in 2003.  His Guardant360 analysis did not show NF1 mutation   Prior Therapy:  1. He is status post a prostatectomy and lymph node dissection done on November 05, 2001.   2. S/P 6480 cGy of radiation therapy in 36 fractions between February and April 2005.   3. He developed advanced disease and was treated with Lupron in 2011.  His PSA nadir down to 4.11. PSA was up to 5.91 in December of 2012 with a testosterone level of 35. His staging workup did not reveal any bony metastasis.  4. Casodex was added to Lupron on 05/2011. This was discontinued in January 2016 due to progression of disease.  5. Zytiga 1000 mg daily started on 05/13/2014.  Therapy discontinued in December 2018 because of progression of disease. Bone scan of the time showed limited metastatic disease.  6.  He is status post radiation to pelvic lymph nodes between October 5 and December 11, 2018.  He received total of 40 Gray in 5 fractions.  7. Xtandi 160 mg daily started in December 2018.  Therapy discontinued in September 2021.  8. Xofigo monthly infusion started in October 2021.  He completed 6 months of therapy.  9. Taxotere chemotherapy 75 mg per metered square every 3 weeks started in November 29, 2020.  He completed 7 cycles of therapy on May 04, 2021.  Therapy discontinued due to patient's preference and toxicities.  Current therapy:     Eligard 30 mg every 4 months.  This will be repeated in October 2023.  Xgeva 120 mg given every 4 weeks started in October 2021.  Last treatment given in June 2023 and will be repeated every 4 months moving forward.    Interim History: [name] returns  today for a follow-up.  Since the last visit, he reports no major changes in his health.  He continues to improve clinically slowly.  His edema has decreased in his lower extremity and his performance status is improving slowly.  He is able to walk 30 minutes every day.  He denies any bone pain or pathological fractures.  He is eating better and has not reported any wife.         Medications: Reviewed without changes. Current Outpatient Medications  Medication Sig Dispense Refill   ALPRAZolam (XANAX) 0.5 MG tablet Take 0.5 mg by mouth 3 (three) times daily as needed for sleep. (Patient not taking: No sig reported)     amLODipine (NORVASC) 5 MG tablet amlodipine 5 mg tablet  Take 1 tablet every day by oral route.     Ascorbic Acid (VITAMIN C) 100 MG CHEW Vitamin C  1 once a day     aspirin EC 81 MG tablet Take 81 mg by mouth daily.     Bergamot Oil OIL Bergamot  Take 1 or 2 capsules daily     calcium-vitamin D (OSCAL 500/200 D-3) 500-200 MG-UNIT tablet Take 1 tablet by mouth daily with breakfast. 1 tablet    Cholecalciferol (VITAMIN D3) 3000 units TABS cholecalciferol (vitamin D3) 1,000 unit tablet  1 tablet orally once a day     furosemide (LASIX) 40 MG tablet TAKE ONE TABLET BY MOUTH TWICE DAILY 60  tablet 1   Leuprolide Acetate (LUPRON DEPOT IM) Inject into the muscle every 4 (four) months.     lidocaine-prilocaine (EMLA) cream Apply 1 application topically as needed. 30 g 0   Multiple Vitamins-Minerals (MULTIVITAMIN ADULT PO) multivitamin  1 tablet orally once a day     NON FORMULARY CBD oil 500 mg  Take 1 dropperful under tongue in the evening or at bedtime     Omega-3 Fatty Acids (FISH OIL) 1000 MG CAPS Fish Oil  2 tablet po daily     prochlorperazine (COMPAZINE) 10 MG tablet Take 1 tablet (10 mg total) by mouth every 6 (six) hours as needed for nausea or vomiting. 30 tablet 0   XTANDI 40 MG tablet TAKE 4 TABLETS (160 MG TOTAL) BY MOUTH DAILY. 120 tablet 0   No current  facility-administered medications for this visit.    Allergies:  Allergies  Allergen Reactions   Penicillins Rash and Other (See Comments)    Has patient had a PCN reaction causing immediate rash, facial/tongue/throat swelling, SOB or lightheadedness with hypotension: YES Has patient had a PCN reaction causing severe rash involving mucus membranes or skin necrosis: YES Has patient had a PCN reaction that required hospitalization: NO Has patient had a PCN reaction occurring within the last 10 years: NO       Physical Exam:         Blood pressure (!) 126/59, pulse (!) 56, temperature 97.6 F (36.4 C), temperature source Temporal, resp. rate 15, weight 213 lb 1.6 oz (96.7 kg), SpO2 99 %.    ECOG: 1     General appearance: Alert, awake without any distress. Head: Atraumatic without abnormalities Oropharynx: Without any thrush or ulcers. Eyes: No scleral icterus. Lymph nodes: No lymphadenopathy noted in the cervical, supraclavicular, or axillary nodes Heart:regular rate and rhythm, without any murmurs or gallops.   Lung: Clear to auscultation without any rhonchi, wheezes or dullness to percussion. Abdomin: Soft, nontender without any shifting dullness or ascites. Musculoskeletal: No clubbing or cyanosis. Neurological: No motor or sensory deficits. Skin: No rashes or lesions.                    Lab Results: Lab Results  Component Value Date   WBC 4.5 08/14/2021   HGB 10.7 (L) 08/14/2021   HCT 32.5 (L) 08/14/2021   MCV 90.5 08/14/2021   PLT 125 (L) 08/14/2021     Chemistry      Component Value Date/Time   NA 138 08/14/2021 0838   NA 141 02/01/2017 0749   K 4.4 08/14/2021 0838   K 3.5 02/01/2017 0749   CL 102 08/14/2021 0838   CL 104 06/11/2012 0817   CO2 30 08/14/2021 0838   CO2 26 02/01/2017 0749   BUN 22 08/14/2021 0838   BUN 14.5 02/01/2017 0749   CREATININE 0.96 08/14/2021 0838   CREATININE 0.9 02/01/2017 0749      Component Value  Date/Time   CALCIUM 8.9 08/14/2021 0838   CALCIUM 9.2 02/01/2017 0749   ALKPHOS 66 08/14/2021 0838   ALKPHOS 70 02/01/2017 0749   AST 13 (L) 08/14/2021 0838   AST 17 02/01/2017 0749   ALT 7 08/14/2021 0838   ALT 14 02/01/2017 0749   BILITOT 0.4 08/14/2021 0838   BILITOT 0.95 02/01/2017 0749         Impression and Plan:  76 year old man with:  1. 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  Treatment options moving forward were discussed  today which include different salvage therapy options including Jevtana chemotherapy versus Pluvicto.  Risks and benefits of those options are waiting versus continuing active surveillance and supportive care.  At this point, he is concerned about further compromise in his quality of life and would like to defer any additional treatment for the time being.  He understands his PSA is rising likely will result in 2 symptoms at this time but to defer any additional treatments.   2. Androgen deprivation.  He will receive Eligard in October 2023 and continued indefinitely.   3.  Bone directed therapy: His [name] was resumed in June 2023 to be given every 4 months.  4.  Pleural effusion: Likely related to systemic chemotherapy rather than malignant effusion.  5.  Prognosis and goals of care: Therapy remains palliative although aggressive measures remains warranted given his reasonable performance status.  6.  Lower extremity edema: Improved significantly on Lasix.  7. Follow up: In 2 months for repeat follow-up.  30 minutes were spent on this encounter.  The time was dedicated to reviewing laboratory data, disease status update and outlining future plan of care discussion.   Zola Button, MD 8/28/20238:34 AM

## 2021-10-24 LAB — PROSTATE-SPECIFIC AG, SERUM (LABCORP): Prostate Specific Ag, Serum: 1774 ng/mL — ABNORMAL HIGH (ref 0.0–4.0)

## 2021-10-31 ENCOUNTER — Telehealth: Payer: Self-pay | Admitting: Oncology

## 2021-10-31 NOTE — Telephone Encounter (Signed)
Scheduled per 08/28 los, patient has been called and notified. 

## 2021-12-18 ENCOUNTER — Inpatient Hospital Stay: Payer: Medicare Other | Attending: Oncology

## 2021-12-18 ENCOUNTER — Inpatient Hospital Stay: Payer: Medicare Other | Admitting: Oncology

## 2021-12-18 ENCOUNTER — Other Ambulatory Visit: Payer: Self-pay

## 2021-12-18 ENCOUNTER — Inpatient Hospital Stay: Payer: Medicare Other

## 2021-12-18 VITALS — BP 121/75 | HR 60 | Temp 98.7°F | Resp 16 | Ht 75.0 in | Wt 210.5 lb

## 2021-12-18 DIAGNOSIS — Z5111 Encounter for antineoplastic chemotherapy: Secondary | ICD-10-CM | POA: Insufficient documentation

## 2021-12-18 DIAGNOSIS — C7951 Secondary malignant neoplasm of bone: Secondary | ICD-10-CM | POA: Insufficient documentation

## 2021-12-18 DIAGNOSIS — C61 Malignant neoplasm of prostate: Secondary | ICD-10-CM

## 2021-12-18 DIAGNOSIS — Z95828 Presence of other vascular implants and grafts: Secondary | ICD-10-CM

## 2021-12-18 DIAGNOSIS — Z923 Personal history of irradiation: Secondary | ICD-10-CM | POA: Diagnosis not present

## 2021-12-18 LAB — CBC WITH DIFFERENTIAL (CANCER CENTER ONLY)
Abs Immature Granulocytes: 0.02 10*3/uL (ref 0.00–0.07)
Basophils Absolute: 0 10*3/uL (ref 0.0–0.1)
Basophils Relative: 0 %
Eosinophils Absolute: 0.2 10*3/uL (ref 0.0–0.5)
Eosinophils Relative: 3 %
HCT: 32.5 % — ABNORMAL LOW (ref 39.0–52.0)
Hemoglobin: 10.8 g/dL — ABNORMAL LOW (ref 13.0–17.0)
Immature Granulocytes: 0 %
Lymphocytes Relative: 22 %
Lymphs Abs: 1 10*3/uL (ref 0.7–4.0)
MCH: 31 pg (ref 26.0–34.0)
MCHC: 33.2 g/dL (ref 30.0–36.0)
MCV: 93.4 fL (ref 80.0–100.0)
Monocytes Absolute: 0.3 10*3/uL (ref 0.1–1.0)
Monocytes Relative: 7 %
Neutro Abs: 3.1 10*3/uL (ref 1.7–7.7)
Neutrophils Relative %: 68 %
Platelet Count: 148 10*3/uL — ABNORMAL LOW (ref 150–400)
RBC: 3.48 MIL/uL — ABNORMAL LOW (ref 4.22–5.81)
RDW: 15 % (ref 11.5–15.5)
WBC Count: 4.7 10*3/uL (ref 4.0–10.5)
nRBC: 0 % (ref 0.0–0.2)

## 2021-12-18 LAB — CMP (CANCER CENTER ONLY)
ALT: 9 U/L (ref 0–44)
AST: 14 U/L — ABNORMAL LOW (ref 15–41)
Albumin: 3.4 g/dL — ABNORMAL LOW (ref 3.5–5.0)
Alkaline Phosphatase: 56 U/L (ref 38–126)
Anion gap: 4 — ABNORMAL LOW (ref 5–15)
BUN: 25 mg/dL — ABNORMAL HIGH (ref 8–23)
CO2: 30 mmol/L (ref 22–32)
Calcium: 8.3 mg/dL — ABNORMAL LOW (ref 8.9–10.3)
Chloride: 104 mmol/L (ref 98–111)
Creatinine: 0.98 mg/dL (ref 0.61–1.24)
GFR, Estimated: >60 mL/min (ref >60)
Glucose, Bld: 108 mg/dL — ABNORMAL HIGH (ref 70–99)
Potassium: 4.3 mmol/L (ref 3.5–5.1)
Sodium: 138 mmol/L (ref 135–145)
Total Bilirubin: 0.4 mg/dL (ref 0.3–1.2)
Total Protein: 6.5 g/dL (ref 6.5–8.1)

## 2021-12-18 MED ORDER — HEPARIN SOD (PORK) LOCK FLUSH 100 UNIT/ML IV SOLN
500.0000 [IU] | Freq: Once | INTRAVENOUS | Status: AC
  Administered 2021-12-18: 500 [IU]

## 2021-12-18 MED ORDER — SODIUM CHLORIDE 0.9% FLUSH
10.0000 mL | Freq: Once | INTRAVENOUS | Status: AC
  Administered 2021-12-18: 10 mL

## 2021-12-18 MED ORDER — LEUPROLIDE ACETATE (4 MONTH) 30 MG ~~LOC~~ KIT
30.0000 mg | PACK | Freq: Once | SUBCUTANEOUS | Status: AC
  Administered 2021-12-18: 30 mg via SUBCUTANEOUS
  Filled 2021-12-18: qty 30

## 2021-12-18 NOTE — Progress Notes (Signed)
Hold xgeva today Per Dr. Alen Blew with calcium level of 8.3. patient still receiving eligard today.

## 2021-12-18 NOTE — Progress Notes (Signed)
Hematology and Oncology Follow Up Visit  [name] [id number] 19-Oct-1945 76 y.o. 12/18/2021 8:11 AM   Principle Diagnosis: 76 year old man with advanced prostate cancer diagnosed in 2013.  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     Prior Therapy:  1. He is status post a prostatectomy and lymph node dissection done on November 05, 2001.   2. S/P 6480 cGy of radiation therapy in 36 fractions between February and April 2005.   3. He developed advanced disease and was treated with Lupron in 2011.  His PSA nadir down to 4.11. PSA was up to 5.91 in December of 2012 with a testosterone level of 35. His staging workup did not reveal any bony metastasis.  4. Casodex was added to Lupron on 05/2011. This was discontinued in January 2016 due to progression of disease.  5. Zytiga 1000 mg daily started on 05/13/2014.  Therapy discontinued in December 2018 because of progression of disease. Bone scan of the time showed limited metastatic disease.  6.  He is status post radiation to pelvic lymph nodes between October 5 and December 11, 2018.  He received total of 40 Gray in 5 fractions.  7. Xtandi 160 mg daily started in December 2018.  Therapy discontinued in September 2021.  8. Xofigo monthly infusion started in October 2021.  He completed 6 months of therapy.  9. Taxotere chemotherapy 75 mg per metered square every 3 weeks started in November 29, 2020.  He completed 7 cycles of therapy on May 04, 2021.  Therapy discontinued due to patient's preference and toxicities.  Current therapy:     Eligard 30 mg every 4 months.  This will be repeated on today's visit.  Xgeva 120 mg every 4 months.  He is due to receive this today.    Interim History: [name] returns today for repeat evaluation.  Since the last visit, he reports no major changes in his health.  His performance status and quality  of life remain overall stable.  He is still limited in his mobility and stamina but still able to perform most activities of daily living.  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  He was quite fatigued afterwards but still able to do it.  He has reported lower back pain and uses Tylenol successfully.  His appetite is reasonable and his respiratory status remains adequate.         Medications: Updated on review. Current Outpatient Medications  Medication Sig Dispense Refill   ALPRAZolam (XANAX) 0.5 MG tablet Take 0.5 mg by mouth 3 (three) times daily as needed for sleep. (Patient not taking: No sig reported)     amLODipine (NORVASC) 5 MG tablet amlodipine 5 mg tablet  Take 1 tablet every day by oral route.     Ascorbic Acid (VITAMIN C) 100 MG CHEW Vitamin C  1 once a day     aspirin EC 81 MG tablet Take 81 mg by mouth daily.     Bergamot Oil OIL Bergamot  Take 1 or 2 capsules daily     calcium-vitamin D (OSCAL 500/200 D-3) 500-200 MG-UNIT tablet Take 1 tablet by mouth daily with breakfast. 1 tablet    Cholecalciferol (VITAMIN D3) 3000 units TABS cholecalciferol (vitamin D3) 1,000 unit tablet  1 tablet orally once a day     furosemide (LASIX) 40 MG tablet TAKE  ONE TABLET BY MOUTH TWICE DAILY 60 tablet 1   Leuprolide Acetate (LUPRON DEPOT IM) Inject into the muscle every 4 (four) months.     lidocaine-prilocaine (EMLA) cream Apply 1 application topically as needed. 30 g 0   Multiple Vitamins-Minerals (MULTIVITAMIN ADULT PO) multivitamin  1 tablet orally once a day     NON FORMULARY CBD oil 500 mg  Take 1 dropperful under tongue in the evening or at bedtime     Omega-3 Fatty Acids (FISH OIL) 1000 MG CAPS Fish Oil  2 tablet po daily     prochlorperazine (COMPAZINE) 10 MG tablet Take 1 tablet (10 mg total) by mouth every 6 (six) hours as needed for nausea or vomiting. 30 tablet 0   XTANDI 40 MG tablet TAKE 4 TABLETS (160 MG TOTAL) BY MOUTH DAILY. 120 tablet 0    No current facility-administered medications for this visit.    Allergies:  Allergies  Allergen Reactions   Penicillins Rash and Other (See Comments)    Has patient had a PCN reaction causing immediate rash, facial/tongue/throat swelling, SOB or lightheadedness with hypotension: YES Has patient had a PCN reaction causing severe rash involving mucus membranes or skin necrosis: YES Has patient had a PCN reaction that required hospitalization: NO Has patient had a PCN reaction occurring within the last 10 years: NO       Physical Exam:     Blood pressure 121/75, pulse 60, temperature 98.7 F (37.1 C), temperature source Tympanic, resp. rate 16, height '6\' 3"'$  (1.905 m), weight 210 lb 8 oz (95.5 kg), SpO2 97 %.         ECOG: 1    General appearance: Comfortable appearing without any discomfort Head: Normocephalic without any trauma Oropharynx: Mucous membranes are moist and pink without any thrush or ulcers. Eyes: Pupils are equal and round reactive to light. Lymph nodes: No cervical, supraclavicular, inguinal or axillary lymphadenopathy.   Heart:regular rate and rhythm.  S1 and S2 without leg edema. Lung: Clear without any rhonchi or wheezes.  No dullness to percussion. Abdomin: Soft, nontender, nondistended with good bowel sounds.  No hepatosplenomegaly. Musculoskeletal: No joint deformity or effusion.  Full range of motion noted. Neurological: No deficits noted on motor, sensory and deep tendon reflex exam. Skin: No petechial rash or dryness.  Appeared moist.                      Lab Results: Lab Results  Component Value Date   WBC 4.2 10/23/2021   HGB 10.7 (L) 10/23/2021   HCT 31.9 (L) 10/23/2021   MCV 92.2 10/23/2021   PLT 123 (L) 10/23/2021     Chemistry      Component Value Date/Time   NA 139 10/23/2021 0854   NA 141 02/01/2017 0749   K 4.3 10/23/2021 0854   K 3.5 02/01/2017 0749   CL 102 10/23/2021 0854   CL 104 06/11/2012 0817    CO2 32 10/23/2021 0854   CO2 26 02/01/2017 0749   BUN 27 (H) 10/23/2021 0854   BUN 14.5 02/01/2017 0749   CREATININE 0.88 10/23/2021 0854   CREATININE 0.9 02/01/2017 0749      Component Value Date/Time   CALCIUM 8.5 (L) 10/23/2021 0854   CALCIUM 9.2 02/01/2017 0749   ALKPHOS 53 10/23/2021 0854   ALKPHOS 70 02/01/2017 0749   AST 14 (L) 10/23/2021 0854   AST 17 02/01/2017 0749   ALT 8 10/23/2021 0854   ALT 14 02/01/2017 0749  BILITOT 0.5 10/23/2021 0854   BILITOT 0.95 02/01/2017 0749       Latest Reference Range & Units 06/09/21 11:54 07/03/21 08:40 08/14/21 08:38 10/23/21 08:54  Prostate Specific Ag, Serum 0.0 - 4.0 ng/mL 1,170.0 (H) 1,493.0 (H) 1,697.0 (H) 1,774.0 (H)  (H): Data is abnormally high   Impression and Plan:  76 year old man with:  1.  Advanced prostate cancer with disease to the bone diagnosed in 2013.  He has castration-resistant after progressing on multiple therapies.   His disease status was updated at this time and PSA was reviewed.  He continues to show rise in the last 6 months although clinical status remains not dramatically different.  Treatment options moving forward including Jevtana chemotherapy, Pluvicto versus transitioning to hospice.  Risks and benefits of active treatment and complication associated with these were discussed.  This was weighed with quality of life desires and goals of care we have discussed.  After discussion today, we opted to continue with active surveillance only given his overall desires and goals of care.  2. Androgen deprivation.  This will be repeated today and in 4 months.  Complications including osteoporosis, hot flashes and weight gain were reviewed.  3.  Bone directed therapy: He is currently on Xgeva and will be repeated today if his calcium is adequate.  Hypocalcemia and osteonecrosis of the jaw complications were discussed.   4.  Prognosis and goals of care: His disease remains incurable and he is approaching  end-stage status.  His performance status is marginal but still desires aggressive measures.    5. Follow up: He will return in in 2 months for repeat follow-up.  30 minutes were dedicated to this visit.  The time was spent on updating his disease status, treatment choices and outlining future plan of care review.   Zola Button, MD 10/23/20238:11 AM

## 2021-12-19 ENCOUNTER — Telehealth: Payer: Self-pay | Admitting: Oncology

## 2021-12-19 LAB — PROSTATE-SPECIFIC AG, SERUM (LABCORP): Prostate Specific Ag, Serum: 2595 ng/mL — ABNORMAL HIGH (ref 0.0–4.0)

## 2021-12-19 NOTE — Telephone Encounter (Signed)
Scheduled per 10/23 los, patient called to schedule next appointment. Informed patient regarding December appointment, patient is notified.

## 2022-01-25 ENCOUNTER — Telehealth: Payer: Self-pay | Admitting: Oncology

## 2022-01-25 NOTE — Telephone Encounter (Signed)
Called patient regarding upcoming December appointments.Left a voicemail.

## 2022-02-12 ENCOUNTER — Inpatient Hospital Stay: Payer: Medicare Other | Admitting: Oncology

## 2022-02-12 ENCOUNTER — Inpatient Hospital Stay: Payer: Medicare Other | Attending: Oncology

## 2022-02-12 VITALS — BP 113/64 | HR 60 | Temp 97.8°F | Resp 18 | Ht 75.0 in | Wt 205.5 lb

## 2022-02-12 DIAGNOSIS — C7951 Secondary malignant neoplasm of bone: Secondary | ICD-10-CM | POA: Diagnosis not present

## 2022-02-12 DIAGNOSIS — C61 Malignant neoplasm of prostate: Secondary | ICD-10-CM

## 2022-02-12 DIAGNOSIS — Z95828 Presence of other vascular implants and grafts: Secondary | ICD-10-CM

## 2022-02-12 DIAGNOSIS — Z923 Personal history of irradiation: Secondary | ICD-10-CM | POA: Diagnosis not present

## 2022-02-12 LAB — CBC WITH DIFFERENTIAL (CANCER CENTER ONLY)
Abs Immature Granulocytes: 0.02 10*3/uL (ref 0.00–0.07)
Basophils Absolute: 0 10*3/uL (ref 0.0–0.1)
Basophils Relative: 0 %
Eosinophils Absolute: 0.1 10*3/uL (ref 0.0–0.5)
Eosinophils Relative: 2 %
HCT: 30.9 % — ABNORMAL LOW (ref 39.0–52.0)
Hemoglobin: 10.4 g/dL — ABNORMAL LOW (ref 13.0–17.0)
Immature Granulocytes: 0 %
Lymphocytes Relative: 19 %
Lymphs Abs: 0.9 10*3/uL (ref 0.7–4.0)
MCH: 31.5 pg (ref 26.0–34.0)
MCHC: 33.7 g/dL (ref 30.0–36.0)
MCV: 93.6 fL (ref 80.0–100.0)
Monocytes Absolute: 0.3 10*3/uL (ref 0.1–1.0)
Monocytes Relative: 7 %
Neutro Abs: 3.3 10*3/uL (ref 1.7–7.7)
Neutrophils Relative %: 72 %
Platelet Count: 111 10*3/uL — ABNORMAL LOW (ref 150–400)
RBC: 3.3 MIL/uL — ABNORMAL LOW (ref 4.22–5.81)
RDW: 15.9 % — ABNORMAL HIGH (ref 11.5–15.5)
WBC Count: 4.6 10*3/uL (ref 4.0–10.5)
nRBC: 0 % (ref 0.0–0.2)

## 2022-02-12 LAB — CMP (CANCER CENTER ONLY)
ALT: 8 U/L (ref 0–44)
AST: 15 U/L (ref 15–41)
Albumin: 3.6 g/dL (ref 3.5–5.0)
Alkaline Phosphatase: 63 U/L (ref 38–126)
Anion gap: 6 (ref 5–15)
BUN: 25 mg/dL — ABNORMAL HIGH (ref 8–23)
CO2: 31 mmol/L (ref 22–32)
Calcium: 9.2 mg/dL (ref 8.9–10.3)
Chloride: 100 mmol/L (ref 98–111)
Creatinine: 1.32 mg/dL — ABNORMAL HIGH (ref 0.61–1.24)
GFR, Estimated: 57 mL/min — ABNORMAL LOW (ref >60)
Glucose, Bld: 182 mg/dL — ABNORMAL HIGH (ref 70–99)
Potassium: 3.9 mmol/L (ref 3.5–5.1)
Sodium: 137 mmol/L (ref 135–145)
Total Bilirubin: 0.7 mg/dL (ref 0.3–1.2)
Total Protein: 6.3 g/dL — ABNORMAL LOW (ref 6.5–8.1)

## 2022-02-12 MED ORDER — HEPARIN SOD (PORK) LOCK FLUSH 100 UNIT/ML IV SOLN
500.0000 [IU] | Freq: Once | INTRAVENOUS | Status: AC
  Administered 2022-02-12: 500 [IU]

## 2022-02-12 MED ORDER — SODIUM CHLORIDE 0.9% FLUSH
10.0000 mL | Freq: Once | INTRAVENOUS | Status: AC
  Administered 2022-02-12: 10 mL

## 2022-02-12 NOTE — Progress Notes (Signed)
Hematology and Oncology Follow Up Visit  [name] [id number] May 03, 1945 76 y.o. 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     Prior Therapy:  1. He is status post a prostatectomy and lymph node dissection done on November 05, 2001.   2. S/P 6480 cGy of radiation therapy in 36 fractions between February and April 2005.   3. He developed advanced disease and was treated with Lupron in 2011.  His PSA nadir down to 4.11. PSA was up to 5.91 in December of 2012 with a testosterone level of 35. His staging workup did not reveal any bony metastasis.  4. Casodex was added to Lupron on 05/2011. This was discontinued in January 2016 due to progression of disease.  5. Zytiga 1000 mg daily started on 05/13/2014.  Therapy discontinued in December 2018 because of progression of disease. Bone scan of the time showed limited metastatic disease.  6.  He is status post radiation to pelvic lymph nodes between October 5 and December 11, 2018.  He received total of 40 Gray in 5 fractions.  7. Xtandi 160 mg daily started in December 2018.  Therapy discontinued in September 2021.  8. Xofigo monthly infusion started in October 2021.  He completed 6 months of therapy.  9. Taxotere chemotherapy 75 mg per metered square every 3 weeks started in November 29, 2020.  He completed 7 cycles of therapy on May 04, 2021.  Therapy discontinued due to patient's preference and toxicities.  Current therapy:     Eligard 30 mg every 4 months.  This will be repeated in February 2023.  Xgeva 120 mg every 4 months.  This will be repeated pending his calcium level.    Interim History: [name] returns today for repeat evaluation.  Since last visit, he reports more functional decline and decrease in his performance status.  He has  reported more rigidity and difficulty with ambulation and gait.  He denies any falls or syncope.  His neuropathy has not changed but he has reported to increase in his tremor and overall rigidity.  He denied any back pain or bone pain.  He denies any decline in his appetite.  He did report some slight abdominal discomfort.  He has also reported continue diuretics currently at 1220 mg of Lasix.         Medications: Updated on review. Current Outpatient Medications  Medication Sig Dispense Refill   ALPRAZolam (XANAX) 0.5 MG tablet Take 0.5 mg by mouth 3 (three) times daily as needed for sleep. (Patient not taking: No sig reported)     amLODipine (NORVASC) 5 MG tablet amlodipine 5 mg tablet  Take 1 tablet every day by oral route.     Ascorbic Acid (VITAMIN C) 100 MG CHEW Vitamin C  1 once a day     aspirin EC 81 MG tablet Take 81 mg by mouth daily.     Bergamot Oil OIL Bergamot  Take 1 or 2 capsules daily     calcium-vitamin D (OSCAL 500/200 D-3) 500-200 MG-UNIT tablet Take 1 tablet by mouth daily with breakfast. 1 tablet    Cholecalciferol (VITAMIN D3) 3000 units TABS cholecalciferol (vitamin D3) 1,000 unit tablet  1 tablet orally once a day     furosemide (LASIX) 40 MG tablet TAKE ONE TABLET BY MOUTH TWICE DAILY 60 tablet  1   Leuprolide Acetate (LUPRON DEPOT IM) Inject into the muscle every 4 (four) months.     lidocaine-prilocaine (EMLA) cream Apply 1 application topically as needed. 30 g 0   Multiple Vitamins-Minerals (MULTIVITAMIN ADULT PO) multivitamin  1 tablet orally once a day     NON FORMULARY CBD oil 500 mg  Take 1 dropperful under tongue in the evening or at bedtime     Omega-3 Fatty Acids (FISH OIL) 1000 MG CAPS Fish Oil  2 tablet po daily     prochlorperazine (COMPAZINE) 10 MG tablet Take 1 tablet (10 mg total) by mouth every 6 (six) hours as needed for nausea or vomiting. 30 tablet 0   XTANDI 40 MG tablet TAKE 4 TABLETS (160 MG TOTAL) BY MOUTH DAILY. 120 tablet 0   No  current facility-administered medications for this visit.    Allergies:  Allergies  Allergen Reactions   Penicillins Rash and Other (See Comments)    Has patient had a PCN reaction causing immediate rash, facial/tongue/throat swelling, SOB or lightheadedness with hypotension: YES Has patient had a PCN reaction causing severe rash involving mucus membranes or skin necrosis: YES Has patient had a PCN reaction that required hospitalization: NO Has patient had a PCN reaction occurring within the last 10 years: NO       Physical Exam:       Blood pressure 113/64, pulse 60, temperature 97.8 F (36.6 C), temperature source Temporal, resp. rate 18, height '6\' 3"'$  (1.905 m), weight 205 lb 8 oz (93.2 kg), SpO2 97 %.        ECOG: 1     General appearance: Chronically ill-appearing did not appear in any active distress.  Flat affect noted. Head: Atraumatic without abnormalities Oropharynx: Without any thrush or ulcers. Eyes: No scleral icterus. Lymph nodes: No lymphadenopathy noted in the cervical, supraclavicular, or axillary nodes Heart:regular rate and rhythm, without any murmurs or gallops.   Lung: Clear to auscultation without any rhonchi, wheezes or dullness to percussion. Abdomin: Soft, nontender without any shifting dullness or ascites. Musculoskeletal: No clubbing or cyanosis. Neurological: No motor or sensory deficits.  Shuffling gait noted on ambulation. Skin: No rashes or lesions.                       Lab Results: Lab Results  Component Value Date   WBC 4.7 12/18/2021   HGB 10.8 (L) 12/18/2021   HCT 32.5 (L) 12/18/2021   MCV 93.4 12/18/2021   PLT 148 (L) 12/18/2021     Chemistry      Component Value Date/Time   NA 138 12/18/2021 0830   NA 141 02/01/2017 0749   K 4.3 12/18/2021 0830   K 3.5 02/01/2017 0749   CL 104 12/18/2021 0830   CL 104 06/11/2012 0817   CO2 30 12/18/2021 0830   CO2 26 02/01/2017 0749   BUN 25 (H) 12/18/2021  0830   BUN 14.5 02/01/2017 0749   CREATININE 0.98 12/18/2021 0830   CREATININE 0.9 02/01/2017 0749      Component Value Date/Time   CALCIUM 8.3 (L) 12/18/2021 0830   CALCIUM 9.2 02/01/2017 0749   ALKPHOS 56 12/18/2021 0830   ALKPHOS 70 02/01/2017 0749   AST 14 (L) 12/18/2021 0830   AST 17 02/01/2017 0749   ALT 9 12/18/2021 0830   ALT 14 02/01/2017 0749   BILITOT 0.4 12/18/2021 0830   BILITOT 0.95 02/01/2017 0749        Impression and Plan:  76 year old man with:  1.  Castration-resistant advanced prostate cancer with disease to the bone diagnosed in 2013.   The natural course of his disease was reviewed at this time and treatment choices were discussed.  He has clearly progressed with a rapid rise in his PSA.  Treatment choices including supportive care only transitioning to hospice versus different salvage therapy with Pluvicto or Jevtana chemotherapy.  He is not a candidate for any additional chemotherapy given his poor tolerance previously.  After discussion today, we opted to continue with supportive care only and continuing androgen deprivation alone.  He is more interested in his quality of life and overall performance status is declining.  We have talked about the palliative care and transitioning to hospice at some point.  He is not ready for this discussion currently but he understands he will be in his future.     2. Androgen deprivation.  His next Eligard will be given in February 2024.  3.  Bone directed therapy: He is currently on Xgeva and will be repeated in 2 months.  4.  Prognosis and goals of care: His disease is incurable and any treatment is palliative.  His performance status is marginal for any aggressive measures.  5.  Tremors and worsening gait: I recommend neurological evaluation to rule out movement disorder and possible Parkinson's.  This was suggested by his primary care physician and I certainly agree.  6.  Increase in BUN and creatinine: Have  asked him to decrease his diuretic at this time to 40 mg daily of Lasix.  Follow-up with his primary care physician also in the near future to address this.   7. Follow up: 2 months for Port-A-Cath flush, Eligard and repeat evaluation.  30 minutes were spent on this encounter.  The time was dedicated to updating disease status, treatment choices and outlining future plan of care review.   Zola Button, MD 12/18/20238:26 AM

## 2022-02-13 LAB — PROSTATE-SPECIFIC AG, SERUM (LABCORP): Prostate Specific Ag, Serum: 2950 ng/mL — ABNORMAL HIGH (ref 0.0–4.0)

## 2022-02-28 ENCOUNTER — Telehealth: Payer: Self-pay | Admitting: *Deleted

## 2022-02-28 NOTE — Telephone Encounter (Signed)
Referral called to [facility]

## 2022-02-28 NOTE — Telephone Encounter (Signed)
Wife states patient has declined significantly since last visit with Dr Alen Blew. He is losing function in legs. Has not  seen neurologist to assess for Parkinson's.  Has added grab bars and various things to the house to help with his mobility.He is alternating use of walker and wheelchair.  Is now open to Hospice referral. They have a call into PCP regarding referral to neurologist to evaluate.

## 2022-03-01 ENCOUNTER — Emergency Department (HOSPITAL_COMMUNITY): Payer: Medicare Other

## 2022-03-01 ENCOUNTER — Encounter (HOSPITAL_COMMUNITY): Payer: Self-pay | Admitting: Internal Medicine

## 2022-03-01 ENCOUNTER — Inpatient Hospital Stay (HOSPITAL_COMMUNITY)
Admission: EM | Admit: 2022-03-01 | Discharge: 2022-03-04 | DRG: 300 | Disposition: A | Discharge status: Home Health | Payer: Medicare Other | Attending: Internal Medicine | Admitting: Internal Medicine

## 2022-03-01 ENCOUNTER — Encounter: Payer: Self-pay | Admitting: Oncology

## 2022-03-01 ENCOUNTER — Other Ambulatory Visit: Payer: Self-pay

## 2022-03-01 DIAGNOSIS — R531 Weakness: Secondary | ICD-10-CM | POA: Diagnosis present

## 2022-03-01 DIAGNOSIS — Z515 Encounter for palliative care: Secondary | ICD-10-CM | POA: Diagnosis not present

## 2022-03-01 DIAGNOSIS — E861 Hypovolemia: Secondary | ICD-10-CM | POA: Diagnosis not present

## 2022-03-01 DIAGNOSIS — C61 Malignant neoplasm of prostate: Secondary | ICD-10-CM | POA: Diagnosis present

## 2022-03-01 DIAGNOSIS — D500 Iron deficiency anemia secondary to blood loss (chronic): Secondary | ICD-10-CM | POA: Diagnosis present

## 2022-03-01 DIAGNOSIS — C7951 Secondary malignant neoplasm of bone: Secondary | ICD-10-CM | POA: Diagnosis present

## 2022-03-01 DIAGNOSIS — R627 Adult failure to thrive: Secondary | ICD-10-CM | POA: Diagnosis present

## 2022-03-01 DIAGNOSIS — N281 Cyst of kidney, acquired: Secondary | ICD-10-CM | POA: Diagnosis present

## 2022-03-01 DIAGNOSIS — Z923 Personal history of irradiation: Secondary | ICD-10-CM | POA: Diagnosis not present

## 2022-03-01 DIAGNOSIS — R29898 Other symptoms and signs involving the musculoskeletal system: Secondary | ICD-10-CM | POA: Diagnosis not present

## 2022-03-01 DIAGNOSIS — I10 Essential (primary) hypertension: Secondary | ICD-10-CM | POA: Diagnosis present

## 2022-03-01 DIAGNOSIS — Z7982 Long term (current) use of aspirin: Secondary | ICD-10-CM | POA: Diagnosis not present

## 2022-03-01 DIAGNOSIS — E869 Volume depletion, unspecified: Secondary | ICD-10-CM | POA: Diagnosis not present

## 2022-03-01 DIAGNOSIS — D649 Anemia, unspecified: Secondary | ICD-10-CM | POA: Diagnosis not present

## 2022-03-01 DIAGNOSIS — I82412 Acute embolism and thrombosis of left femoral vein: Secondary | ICD-10-CM | POA: Diagnosis present

## 2022-03-01 DIAGNOSIS — R7989 Other specified abnormal findings of blood chemistry: Secondary | ICD-10-CM | POA: Diagnosis not present

## 2022-03-01 DIAGNOSIS — Z808 Family history of malignant neoplasm of other organs or systems: Secondary | ICD-10-CM

## 2022-03-01 DIAGNOSIS — R5381 Other malaise: Secondary | ICD-10-CM | POA: Diagnosis present

## 2022-03-01 DIAGNOSIS — Z86718 Personal history of other venous thrombosis and embolism: Secondary | ICD-10-CM | POA: Diagnosis not present

## 2022-03-01 DIAGNOSIS — Z7189 Other specified counseling: Secondary | ICD-10-CM | POA: Diagnosis not present

## 2022-03-01 DIAGNOSIS — I82461 Acute embolism and thrombosis of right calf muscular vein: Secondary | ICD-10-CM | POA: Diagnosis present

## 2022-03-01 DIAGNOSIS — J90 Pleural effusion, not elsewhere classified: Secondary | ICD-10-CM | POA: Diagnosis present

## 2022-03-01 DIAGNOSIS — Z9079 Acquired absence of other genital organ(s): Secondary | ICD-10-CM

## 2022-03-01 DIAGNOSIS — I959 Hypotension, unspecified: Secondary | ICD-10-CM | POA: Diagnosis present

## 2022-03-01 DIAGNOSIS — I9589 Other hypotension: Secondary | ICD-10-CM | POA: Diagnosis not present

## 2022-03-01 DIAGNOSIS — R9431 Abnormal electrocardiogram [ECG] [EKG]: Secondary | ICD-10-CM | POA: Diagnosis not present

## 2022-03-01 DIAGNOSIS — Z66 Do not resuscitate: Secondary | ICD-10-CM | POA: Diagnosis present

## 2022-03-01 DIAGNOSIS — Z86711 Personal history of pulmonary embolism: Secondary | ICD-10-CM | POA: Diagnosis not present

## 2022-03-01 DIAGNOSIS — Z79899 Other long term (current) drug therapy: Secondary | ICD-10-CM

## 2022-03-01 DIAGNOSIS — Z1152 Encounter for screening for COVID-19: Secondary | ICD-10-CM | POA: Diagnosis not present

## 2022-03-01 DIAGNOSIS — I824Y3 Acute embolism and thrombosis of unspecified deep veins of proximal lower extremity, bilateral: Secondary | ICD-10-CM | POA: Diagnosis not present

## 2022-03-01 DIAGNOSIS — I82493 Acute embolism and thrombosis of other specified deep vein of lower extremity, bilateral: Secondary | ICD-10-CM | POA: Diagnosis not present

## 2022-03-01 LAB — URINALYSIS, ROUTINE W REFLEX MICROSCOPIC
Bilirubin Urine: NEGATIVE
Glucose, UA: NEGATIVE mg/dL
Hgb urine dipstick: NEGATIVE
Ketones, ur: NEGATIVE mg/dL
Leukocytes,Ua: NEGATIVE
Nitrite: NEGATIVE
Protein, ur: NEGATIVE mg/dL
Specific Gravity, Urine: 1.013 (ref 1.005–1.030)
pH: 5 (ref 5.0–8.0)

## 2022-03-01 LAB — CREATININE, SERUM
Creatinine, Ser: 1.03 mg/dL (ref 0.61–1.24)
GFR, Estimated: >60 mL/min

## 2022-03-01 LAB — RESP PANEL BY RT-PCR (RSV, FLU A&B, COVID)  RVPGX2
Influenza A by PCR: NEGATIVE
Influenza B by PCR: NEGATIVE
Resp Syncytial Virus by PCR: NEGATIVE
SARS Coronavirus 2 by RT PCR: NEGATIVE

## 2022-03-01 LAB — CBC WITH DIFFERENTIAL/PLATELET
Abs Immature Granulocytes: 0.04 10*3/uL (ref 0.00–0.07)
Basophils Absolute: 0 10*3/uL (ref 0.0–0.1)
Basophils Relative: 0 %
Eosinophils Absolute: 0.1 10*3/uL (ref 0.0–0.5)
Eosinophils Relative: 2 %
HCT: 32.8 % — ABNORMAL LOW (ref 39.0–52.0)
Hemoglobin: 10.8 g/dL — ABNORMAL LOW (ref 13.0–17.0)
Immature Granulocytes: 1 %
Lymphocytes Relative: 21 %
Lymphs Abs: 1 10*3/uL (ref 0.7–4.0)
MCH: 31.3 pg (ref 26.0–34.0)
MCHC: 32.9 g/dL (ref 30.0–36.0)
MCV: 95.1 fL (ref 80.0–100.0)
Monocytes Absolute: 0.4 10*3/uL (ref 0.1–1.0)
Monocytes Relative: 8 %
Neutro Abs: 3.2 10*3/uL (ref 1.7–7.7)
Neutrophils Relative %: 68 %
Platelets: 126 10*3/uL — ABNORMAL LOW (ref 150–400)
RBC: 3.45 MIL/uL — ABNORMAL LOW (ref 4.22–5.81)
RDW: 16 % — ABNORMAL HIGH (ref 11.5–15.5)
WBC: 4.6 10*3/uL (ref 4.0–10.5)
nRBC: 0 % (ref 0.0–0.2)

## 2022-03-01 LAB — CBC
HCT: 27.2 % — ABNORMAL LOW (ref 39.0–52.0)
Hemoglobin: 8.6 g/dL — ABNORMAL LOW (ref 13.0–17.0)
MCH: 30.7 pg (ref 26.0–34.0)
MCHC: 31.6 g/dL (ref 30.0–36.0)
MCV: 97.1 fL (ref 80.0–100.0)
Platelets: 104 10*3/uL — ABNORMAL LOW (ref 150–400)
RBC: 2.8 MIL/uL — ABNORMAL LOW (ref 4.22–5.81)
RDW: 16.1 % — ABNORMAL HIGH (ref 11.5–15.5)
WBC: 4.1 10*3/uL (ref 4.0–10.5)
nRBC: 0 % (ref 0.0–0.2)

## 2022-03-01 LAB — COMPREHENSIVE METABOLIC PANEL
ALT: 12 U/L (ref 0–44)
AST: 18 U/L (ref 15–41)
Albumin: 3.1 g/dL — ABNORMAL LOW (ref 3.5–5.0)
Alkaline Phosphatase: 65 U/L (ref 38–126)
Anion gap: 9 (ref 5–15)
BUN: 22 mg/dL (ref 8–23)
CO2: 29 mmol/L (ref 22–32)
Calcium: 8.8 mg/dL — ABNORMAL LOW (ref 8.9–10.3)
Chloride: 97 mmol/L — ABNORMAL LOW (ref 98–111)
Creatinine, Ser: 0.98 mg/dL (ref 0.61–1.24)
GFR, Estimated: >60 mL/min (ref >60)
Glucose, Bld: 111 mg/dL — ABNORMAL HIGH (ref 70–99)
Potassium: 3.7 mmol/L (ref 3.5–5.1)
Sodium: 135 mmol/L (ref 135–145)
Total Bilirubin: 0.8 mg/dL (ref 0.3–1.2)
Total Protein: 6.4 g/dL — ABNORMAL LOW (ref 6.5–8.1)

## 2022-03-01 LAB — TROPONIN I (HIGH SENSITIVITY)
Troponin I (High Sensitivity): 7 ng/L (ref <18)
Troponin I (High Sensitivity): 7 ng/L (ref <18)

## 2022-03-01 LAB — C-REACTIVE PROTEIN: CRP: 0.6 mg/dL (ref <1.0)

## 2022-03-01 LAB — FERRITIN: Ferritin: 102 ng/mL (ref 24–336)

## 2022-03-01 LAB — TSH: TSH: 3.252 u[IU]/mL (ref 0.350–4.500)

## 2022-03-01 LAB — SEDIMENTATION RATE: Sed Rate: 16 mm/h (ref 0–16)

## 2022-03-01 MED ORDER — ALBUTEROL SULFATE (2.5 MG/3ML) 0.083% IN NEBU: 2.5000 mg | INHALATION_SOLUTION | RESPIRATORY_TRACT | Status: DC | PRN

## 2022-03-01 MED ORDER — SODIUM CHLORIDE 0.9 % IV BOLUS
1000.0000 mL | Freq: Once | INTRAVENOUS | Status: AC
  Administered 2022-03-01: 1000 mL via INTRAVENOUS

## 2022-03-01 MED ORDER — SODIUM CHLORIDE 0.9 % IV SOLN: Freq: Once | INTRAVENOUS | Status: AC

## 2022-03-01 MED ORDER — ASPIRIN 81 MG PO TBEC
81.0000 mg | DELAYED_RELEASE_TABLET | Freq: Every day | ORAL | Status: DC
  Administered 2022-03-02 – 2022-03-04 (×3): 81 mg via ORAL
  Filled 2022-03-01 (×3): qty 1

## 2022-03-01 MED ORDER — ACETAMINOPHEN 325 MG PO TABS: 650.0000 mg | ORAL_TABLET | Freq: Four times a day (QID) | ORAL | Status: DC | PRN

## 2022-03-01 MED ORDER — HEPARIN SODIUM (PORCINE) 5000 UNIT/ML IJ SOLN
5000.0000 [IU] | Freq: Three times a day (TID) | INTRAMUSCULAR | Status: DC
  Administered 2022-03-01: 5000 [IU] via SUBCUTANEOUS
  Filled 2022-03-01: qty 1

## 2022-03-01 MED ORDER — PANTOPRAZOLE SODIUM 40 MG IV SOLR
40.0000 mg | Freq: Two times a day (BID) | INTRAVENOUS | Status: DC
  Administered 2022-03-02 – 2022-03-04 (×5): 40 mg via INTRAVENOUS
  Filled 2022-03-01 (×5): qty 10

## 2022-03-01 MED ORDER — ONDANSETRON HCL 4 MG PO TABS: 4.0000 mg | ORAL_TABLET | Freq: Four times a day (QID) | ORAL | Status: DC | PRN

## 2022-03-01 MED ORDER — SODIUM CHLORIDE 0.9 % IV SOLN: INTRAVENOUS | Status: AC

## 2022-03-01 MED ORDER — PANTOPRAZOLE SODIUM 40 MG IV SOLR
40.0000 mg | Freq: Once | INTRAVENOUS | Status: AC
  Administered 2022-03-02: 40 mg via INTRAVENOUS
  Filled 2022-03-01: qty 10

## 2022-03-01 MED ORDER — HYDROCORTISONE SOD SUC (PF) 100 MG IJ SOLR
100.0000 mg | Freq: Three times a day (TID) | INTRAMUSCULAR | Status: DC
  Administered 2022-03-01: 100 mg via INTRAVENOUS
  Filled 2022-03-01: qty 2

## 2022-03-01 MED ORDER — LORAZEPAM 2 MG/ML IJ SOLN
0.5000 mg | Freq: Once | INTRAMUSCULAR | Status: AC
  Administered 2022-03-01: 0.5 mg via INTRAVENOUS
  Filled 2022-03-01: qty 1

## 2022-03-01 MED ORDER — ONDANSETRON HCL 4 MG/2ML IJ SOLN: 4.0000 mg | Freq: Four times a day (QID) | INTRAMUSCULAR | Status: DC | PRN

## 2022-03-01 MED ORDER — ACETAMINOPHEN 650 MG RE SUPP: 650.0000 mg | Freq: Four times a day (QID) | RECTAL | Status: DC | PRN

## 2022-03-01 MED ORDER — ALBUMIN HUMAN 25 % IV SOLN
50.0000 g | Freq: Once | INTRAVENOUS | Status: AC
  Administered 2022-03-01: 50 g via INTRAVENOUS
  Filled 2022-03-01: qty 200

## 2022-03-01 NOTE — ED Provider Notes (Signed)
[facility] EMERGENCY DEPARTMENT Provider Note   CSN: [id number] Arrival date & time: 03/01/22  0907     History  Chief Complaint  Patient presents with   immoblity   Weakness   unable to walk    [name] is a 77 y.o. male.  Patient has prostate cancer that is advanced and hypertension.  He has general weakness but last couple days his right leg has been much weaker and it continues to get worse  The history is provided by the patient and medical records. No language interpreter was used.  Weakness Severity:  Moderate Onset quality:  Sudden Timing:  Constant Progression:  Worsening Chronicity:  Recurrent Context: not alcohol use   Relieved by:  Nothing Worsened by:  Nothing Ineffective treatments:  None tried Associated symptoms: no abdominal pain, no chest pain, no cough, no diarrhea, no frequency, no headaches and no seizures        Home Medications Prior to Admission medications   Medication Sig Start Date End Date Taking? Authorizing Provider  ALPRAZolam Duanne Moron) 0.5 MG tablet Take 0.5 mg by mouth 3 (three) times daily as needed for sleep. Patient not taking: No sig reported    [provider]  amLODipine (NORVASC) 5 MG tablet amlodipine 5 mg tablet  Take 1 tablet every day by oral route.    [provider]  Ascorbic Acid (VITAMIN C) 100 MG CHEW Vitamin C  1 once a day    [provider]  aspirin EC 81 MG tablet Take 81 mg by mouth daily.    [provider]  Bergamot Oil OIL Bergamot  Take 1 or 2 capsules daily    [provider]  calcium-vitamin D (OSCAL 500/200 D-3) 500-200 MG-UNIT tablet Take 1 tablet by mouth daily with breakfast. 03/19/17   [name], MD  Cholecalciferol (VITAMIN D3) 3000 units TABS cholecalciferol (vitamin D3) 1,000 unit tablet  1 tablet orally once a day    [provider]  furosemide (LASIX) 40 MG tablet TAKE ONE TABLET BY MOUTH TWICE DAILY 09/20/21    [name], MD  Leuprolide Acetate (LUPRON DEPOT IM) Inject into the muscle every 4 (four) months.    [provider]  lidocaine-prilocaine (EMLA) cream Apply 1 application topically as needed. 11/01/20   [name], MD  Multiple Vitamins-Minerals (MULTIVITAMIN ADULT PO) multivitamin  1 tablet orally once a day    [provider]  NON FORMULARY CBD oil 500 mg  Take 1 dropperful under tongue in the evening or at bedtime    [provider]  Omega-3 Fatty Acids (FISH OIL) 1000 MG CAPS Fish Oil  2 tablet po daily    [provider]  prochlorperazine (COMPAZINE) 10 MG tablet Take 1 tablet (10 mg total) by mouth every 6 (six) hours as needed for nausea or vomiting. 11/01/20   [name], MD  XTANDI 40 MG tablet TAKE 4 TABLETS (160 MG TOTAL) BY MOUTH DAILY. 09/16/19   [name], MD      Allergies    Penicillins    Review of Systems   Review of Systems  Constitutional:  Negative for appetite change and fatigue.  HENT:  Negative for congestion, ear discharge and sinus pressure.   Eyes:  Negative for discharge.  Respiratory:  Negative for cough.   Cardiovascular:  Negative for chest pain.  Gastrointestinal:  Negative for abdominal pain and diarrhea.  Genitourinary:  Negative for frequency and hematuria.  Musculoskeletal:  Negative for back pain.  Skin:  Negative for rash.  Neurological:  Positive for weakness. Negative for seizures and headaches.  Psychiatric/Behavioral:  Negative for hallucinations.     Physical Exam Updated Vital Signs BP 102/63   Pulse (!) 59   Temp 98.4 F (36.9 C) (Oral)   Resp 19   Ht '6\' 3"'$  (1.905 m)   Wt 93.4 kg   SpO2 98%   BMI 25.75 kg/m  Physical Exam Vitals and nursing note reviewed.  Constitutional:      Appearance: He is well-developed.  HENT:     Head: Normocephalic.     Nose: Nose normal.  Eyes:     General: No scleral icterus.    Conjunctiva/sclera: Conjunctivae normal.  Neck:     Thyroid:  No thyromegaly.  Cardiovascular:     Rate and Rhythm: Normal rate and regular rhythm.     Heart sounds: No murmur heard.    No friction rub. No gallop.  Pulmonary:     Breath sounds: No stridor. No wheezing or rales.  Chest:     Chest wall: No tenderness.  Abdominal:     General: There is no distension.     Tenderness: There is no abdominal tenderness. There is no rebound.  Musculoskeletal:        General: Normal range of motion.     Cervical back: Neck supple.     Comments: Profound weakness right lower leg.  Patient able to lift with barely off the bed  Lymphadenopathy:     Cervical: No cervical adenopathy.  Skin:    Findings: No erythema or rash.  Neurological:     Mental Status: He is oriented to person, place, and time.     Motor: No abnormal muscle tone.     Coordination: Coordination normal.  Psychiatric:        Behavior: Behavior normal.     ED Results / Procedures / Treatments   Labs (all labs ordered are listed, but only abnormal results are displayed) Labs Reviewed  CBC WITH DIFFERENTIAL/PLATELET - Abnormal; Notable for the following components:      Result Value   RBC 3.45 (*)    Hemoglobin 10.8 (*)    HCT 32.8 (*)    RDW 16.0 (*)    Platelets 126 (*)    All other components within normal limits  COMPREHENSIVE METABOLIC PANEL - Abnormal; Notable for the following components:   Chloride 97 (*)    Glucose, Bld 111 (*)    Calcium 8.8 (*)    Total Protein 6.4 (*)    Albumin 3.1 (*)    All other components within normal limits  URINALYSIS, ROUTINE W REFLEX MICROSCOPIC - Abnormal; Notable for the following components:   Color, Urine AMBER (*)    APPearance HAZY (*)    All other components within normal limits  RESP PANEL BY RT-PCR (RSV, FLU A&B, COVID)  RVPGX2  TROPONIN I (HIGH SENSITIVITY)  TROPONIN I (HIGH SENSITIVITY)    EKG None  Radiology MR LUMBAR SPINE WO CONTRAST  Result Date: 03/01/2022 CLINICAL DATA:  Weakness, inability to walk EXAM: MRI  LUMBAR SPINE WITHOUT CONTRAST TECHNIQUE: Multiplanar, multisequence MR imaging of the lumbar spine was performed. No intravenous contrast was administered. COMPARISON:  No prior MRI lumbar spine available, correlation is made with PET-CT 10/27/2020 and CT abdomen pelvis 11/10/2019 FINDINGS: Evaluation is somewhat limited by motion artifact. The patient declined to repeat motion limited sequences. Segmentation: 5 lumbar type vertebral bodies. Alignment:  Mild dextrocurvature.  No significant listhesis. Vertebrae: Diffusely heterogeneous and increased marrow signal, consistent with the patient's history of prostate cancer. No acute fracture. Conus medullaris and cauda equina: Conus extends to the L2 level. Conus and cauda equina appear normal. Paraspinal and other soft tissues: Large right renal cyst, for which no follow-up is currently indicated. No lymphadenopathy in the field of view. Disc levels: T12-L1: No significant disc bulge. Mild facet arthropathy. No spinal canal stenosis or neural foraminal narrowing. L1-L2: Minimal disc bulge. Mild facet arthropathy. No spinal canal stenosis or neural foraminal narrowing. L2-L3: Minimal disc bulge. Mild facet arthropathy. No spinal canal stenosis or neural foraminal narrowing. L3-L4: Minimal disc bulge. Mild facet arthropathy. No spinal canal stenosis or neural foraminal narrowing. L4-L5: Minimal disc bulge. Mild-to-moderate facet arthropathy. No spinal canal stenosis or neural foraminal narrowing. L5-S1: Minimal disc bulge with small central protrusion. No spinal canal stenosis or neural foraminal narrowing. IMPRESSION: 1. No spinal canal stenosis or neural foraminal narrowing. 2. Diffusely heterogeneous and decreased marrow signal, consistent with the patient's history of prostate cancer. Electronically Signed   By: Merilyn Baba M.D.   On: 03/01/2022 19:23   MR BRAIN WO CONTRAST  Addendum Date: 03/01/2022   ADDENDUM REPORT: 03/01/2022 19:17 ADDENDUM: Correction-  Skull and upper cervical spine: Prior right parietal craniotomy. Heterogeneous calvarial marrow signal, consistent patient's history of prostate cancer. Electronically Signed   By: Merilyn Baba M.D.   On: 03/01/2022 19:17   Result Date: 03/01/2022 CLINICAL DATA:  Immobility, weakness, unable to walk; stroke suspected EXAM: MRI HEAD WITHOUT CONTRAST TECHNIQUE: Multiplanar, multiecho pulse sequences of the brain and surrounding structures were obtained without intravenous contrast. COMPARISON:  No prior MRI head, correlation is made with CT head 03/01/2022 FINDINGS: Evaluation is somewhat limited by motion artifact. Brain: No restricted diffusion to suggest acute or subacute infarct.No acute hemorrhage, mass, mass effect, or midline shift. No hydrocephalus or extra-axial collection.No hemosiderin deposition to suggest remote hemorrhage.Normal pituitary. Low lying cerebellar tonsils, which extend 1-2 mm below the foramen magnum and do not meet criteria for Chiari I malformation.Mild age related cerebral atrophy. Vascular: Patent arterial flow voids. Skull and upper cervical spine: Prior right parietal craniotomy. Otherwise normal marrow signal. Sinuses/Orbits: Clear paranasal sinuses.No acute finding in the orbits. Other: The mastoid air cells are well aerated. IMPRESSION: No acute intracranial process. No evidence of acute or subacute infarct. Electronically Signed: By: Merilyn Baba M.D. On: 03/01/2022 19:13   CT Head Wo Contrast  Result Date: 03/01/2022 CLINICAL DATA:  Right-sided weakness EXAM: CT HEAD WITHOUT CONTRAST TECHNIQUE: Contiguous axial images were obtained from the base of the skull through the vertex without intravenous contrast. RADIATION DOSE REDUCTION: This exam was performed according to the departmental dose-optimization program which includes automated exposure control, adjustment of the mA and/or kV according to patient size and/or use of iterative reconstruction technique. COMPARISON:   Previous studies including the examination of 02/14/2016 FINDINGS: Brain: No acute intracranial findings are seen. Cortical sulci are prominent. There is no focal edema or mass effect. Vascular: Scattered arterial calcifications are seen. Skull: There is previous right frontoparietal craniotomy. Sinuses/Orbits: Unremarkable. Other: None. IMPRESSION: No acute intracranial findings are seen in noncontrast CT brain. Atrophy. Electronically Signed   By: Elmer Picker M.D.   On: 03/01/2022 10:06   DG Chest 2 View  Result Date: 03/01/2022 CLINICAL DATA:  Weakness. Loss of balance. History of prostate cancer. EXAM: CHEST - 2 VIEW COMPARISON:  AP chest 06/16/2021 and 05/17/2021; CT chest 05/16/2021 FINDINGS:  Right chest wall porta catheter is again seen with tip overlying the central superior vena cava. There appears to be resolution of the prior small left pleural effusion seen on 06/16/2021. Slight interval increase in small right pleural effusion and associated right basilar atelectasis compared to 06/16/2021. No pneumothorax. 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 Remote moderate to severe T3 compression fracture better seen on prior CT. IMPRESSION: 1. Slight interval increase in small right pleural effusion and associated right basilar atelectasis compared to 06/16/2021. 2. Interval resolution of the prior small left pleural effusion. Electronically Signed   By: Yvonne Kendall M.D.   On: 03/01/2022 09:55    Procedures Procedures    Medications Ordered in ED Medications  LORazepam (ATIVAN) injection 0.5 mg (0.5 mg Intravenous Given 03/01/22 1735)    ED Course/ Medical Decision Making/ A&P    .moil This patient presents to the ED for concern of weakness in legs, this involves an extensive number of treatment options, and is a complaint that carries with it a high risk of complications and morbidity.  The differential diagnosis includes stroke, worsening prostate cancer   Co  morbidities that complicate the patient evaluation  Prostate cancer and hypertension   Additional history obtained:  Additional history obtained from wife External records from outside source obtained and reviewed including medical records   Lab Tests:  I Ordered, and personally interpreted labs.  The pertinent results include: Hemoglobin 8.6 white count 4.1   Imaging Studies ordered:  I ordered imaging studies including MRI of head and lumbar spine I independently visualized and interpreted imaging which showed unremarkable I agree with the radiologist interpretation   Cardiac Monitoring: / EKG:  The patient was maintained on a cardiac monitor.  I personally viewed and interpreted the cardiac monitored which showed an underlying rhythm of: Normal sinus rhythm   Consultations Obtained:  I requested consultation with the oncology and hospitalist,  and discussed lab and imaging findings as well as pertinent plan - they recommend: Hospitalist with oncology consult tomorrow   Problem List / ED Course / Critical interventions / Medication management  Prostate cancer which is advanced hypertension and profound weakness I ordered medication including normal saline for dehydration Reevaluation of the patient after these medicines showed that the patient stayed the same I have reviewed the patients home medicines and have made adjustments as needed   Social Determinants of Health:  None   Test / Admission - Considered:  None     Patient with worsening weakness to right lower leg.  MRI of head and lumbar spine unremarkable.  I spoke with oncology and they wish hospitalist to admit with oncology consulting tomorrow                         Medical Decision Making Amount and/or Complexity of Data Reviewed Radiology: ordered.  Risk Prescription drug management. Decision regarding hospitalization.   Metastatic prostate cancer with failure to thrive and worsening  weakness to right leg        Final Clinical Impression(s) / ED Diagnoses Final diagnoses:  Weakness    Rx / DC Orders ED Discharge Orders     None         Milton Ferguson, MD 03/03/22 1200

## 2022-03-01 NOTE — ED Notes (Signed)
Patient transported to MRI 

## 2022-03-01 NOTE — ED Triage Notes (Signed)
Wife stated, he was able to take steps with a walker and now he can not. He has prostate cancer and he also has tremors in his hands,

## 2022-03-01 NOTE — H&P (Addendum)
History and Physical    [name] XFG:[id number] DOB: [birth year] DOA: 03/01/2022  PCP: [name], MD  Patient coming from: home   I have personally briefly reviewed patient's old medical records in [place]  Chief Complaint: lower extremity weakness  HPI: [name] is a 77 y.o. 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 Patient per wife however over the last 2 days has acute progression of weakness and is unable to bear weight on his legs. Due to persistent of these symptoms and her inability to take care of him at home patient was brought to ED by EMS. ED Course:  Afeb, bp 115/84, hr 61, rr 16, sat 99% on ra  Labs: Wbc 4.6, hgb 10.8 wbc, plt 126,  Na 135, K 3.7, cl 97, glu 111, cr 0.98, TP6.4 D-dimer 10  CTPA pending HA neg CE:7 ED EKG: Cxr:1. Slight interval increase in small right pleural effusion and associated right basilar atelectasis compared to 06/16/2021. 2. Interval resolution of the prior small left pleural effusion.  CTH No acute intracranial findings are seen in noncontrast CT brain. Atrophy.  MR BRAIN: Skull and upper cervical spine: prior right parietal craniotomy, heterogeneous calvarial marrow signal , consistent with patient's history of prostate cancer    MRI: Lumbar spine  IMPRESSION: 1. No spinal canal stenosis or neural foraminal narrowing. 2. Diffusely heterogeneous and decreased marrow signal, consistent with the patient's history of prostate cancer.  ED course complicated by drop in bp , patient treated with albumin ns x 2L with good response , patient also noted to have drop  in hgb to 8.3 from 10.8 on initial draw.   Per patient wife 2 days ago patient had bloody stools . She noted it was a large amount but states since then she has note any blood in stools.  Patient was typed and crossed  and started on protonix. Patient also noted to have + D-dimer. CTPA ordered.   Discuss regarding goals of care was had with wife she notes that patient did not want resuscitation .   Review of Systems: As per HPI otherwise 10 point review of systems negative.   Past Medical History:  Diagnosis Date   Benign essential hypertension 03/04/2017   Hx of radiation therapy    Prostate cancer [facility])     Past Surgical History:  Procedure Laterality Date   BURR HOLE Right 09/14/2015   Procedure: Right craniotomy for subdural ;  Surgeon: Kary Kos, MD;  Location: [facility];  Service: Neurosurgery;  Laterality: Right;   IR IMAGING GUIDED PORT INSERTION  11/21/2020   PROSTATE BIOPSY     PROSTATECTOMY       reports that he has never smoked. He has never used smokeless tobacco. He reports current alcohol use. He reports that he does not use drugs.  Allergies  Allergen Reactions   Penicillins Rash and Other (See Comments)    Has patient had a PCN reaction causing immediate rash, facial/tongue/throat swelling, SOB or lightheadedness with hypotension: YES Has patient had a PCN reaction causing severe rash involving mucus membranes or skin necrosis: YES Has patient had a PCN reaction that required hospitalization: NO Has patient had a PCN reaction occurring within the  last 10 years: NO     Family History  Problem Relation Age of Onset   Skin cancer Father    Thyroid cancer Brother    Throat cancer Brother    Prostate cancer Neg Hx     Prior to Admission medications   Medication Sig Start Date End Date Taking? Authorizing Provider  ALPRAZolam Duanne Moron) 0.5 MG tablet Take 0.5 mg by mouth 3 (three) times daily as needed for sleep. Patient not taking: No sig reported    [provider]  amLODipine (NORVASC) 5 MG  tablet amlodipine 5 mg tablet  Take 1 tablet every day by oral route.    [provider]  Ascorbic Acid (VITAMIN C) 100 MG CHEW Vitamin C  1 once a day    [provider]  aspirin EC 81 MG tablet Take 81 mg by mouth daily.    [provider]  Bergamot Oil OIL Bergamot  Take 1 or 2 capsules daily    [provider]  calcium-vitamin D (OSCAL 500/200 D-3) 500-200 MG-UNIT tablet Take 1 tablet by mouth daily with breakfast. 03/19/17   [name], MD  Cholecalciferol (VITAMIN D3) 3000 units TABS cholecalciferol (vitamin D3) 1,000 unit tablet  1 tablet orally once a day    [provider]  furosemide (LASIX) 40 MG tablet TAKE ONE TABLET BY MOUTH TWICE DAILY 09/20/21   [name], MD  Leuprolide Acetate (LUPRON DEPOT IM) Inject into the muscle every 4 (four) months.    [provider]  lidocaine-prilocaine (EMLA) cream Apply 1 application topically as needed. 11/01/20   [name], MD  Multiple Vitamins-Minerals (MULTIVITAMIN ADULT PO) multivitamin  1 tablet orally once a day    [provider]  NON FORMULARY CBD oil 500 mg  Take 1 dropperful under tongue in the evening or at bedtime    [provider]  Omega-3 Fatty Acids (FISH OIL) 1000 MG CAPS Fish Oil  2 tablet po daily    [provider]  prochlorperazine (COMPAZINE) 10 MG tablet Take 1 tablet (10 mg total) by mouth every 6 (six) hours as needed for nausea or vomiting. 11/01/20   [name], MD  XTANDI 40 MG tablet TAKE 4 TABLETS (160 MG TOTAL) BY MOUTH DAILY. 09/16/19   [name], MD    Physical Exam: Vitals:   03/01/22 1730 03/01/22 1939 03/01/22 1940 03/01/22 2000  BP: 119/62  (!) 122/110 102/63  Pulse: (!) 57  60 (!) 59  Resp: '17  17 19  '$ Temp:  98.4 F (36.9 C)    TempSrc:  Oral    SpO2: 99%  97% 98%  Weight:      Height:        Constitutional: pale ,NAD, calm, comfortable Vitals:   03/01/22 1730 03/01/22 1939 03/01/22 1940  03/01/22 2000  BP: 119/62  (!) 122/110 102/63  Pulse: (!) 57  60 (!) 59  Resp: '17  17 19  '$ Temp:  98.4 F (36.9 C)    TempSrc:  Oral    SpO2: 99%  97% 98%  Weight:      Height:       Eyes: PERRL, lids and conjunctivae normal ENMT: Mucous membranes are moist. Posterior pharynx clear of any exudate or lesions.Normal dentition.  Neck: normal, supple, no masses, no thyromegaly Respiratory: clear to auscultation bilaterally, no wheezing, no crackles. Normal respiratory effort. No accessory muscle use.  Cardiovascular: Regular rate and rhythm, no murmurs / rubs /  gallops. No extremity edema. 2+ pedal pulses.  Abdomen: no tenderness, no masses palpated. No hepatosplenomegaly. Bowel sounds positive.  Musculoskeletal: no clubbing / cyanosis. No joint deformity upper and lower extremities. Good ROM, no contractures. Normal muscle tone.  Skin: no rashes, lesions, ulcers. No induration Neurologic: CN 2-12 grossly intact. Sensation intact,  Strength 5/5 in all 4. Except lower  3+/5 Psychiatric: alert to self , normal mood ,  Labs on Admission: I have personally reviewed following labs and imaging studies  CBC: Recent Labs  Lab 03/01/22 0928  WBC 4.6  NEUTROABS 3.2  HGB 10.8*  HCT 32.8*  MCV 95.1  PLT 222*   Basic Metabolic Panel: Recent Labs  Lab 03/01/22 0928  NA 135  K 3.7  CL 97*  CO2 29  GLUCOSE 111*  BUN 22  CREATININE 0.98  CALCIUM 8.8*   GFR: Estimated Creatinine Clearance: 79 mL/min (by C-G formula based on SCr of 0.98 mg/dL). Liver Function Tests: Recent Labs  Lab 03/01/22 0928  AST 18  ALT 12  ALKPHOS 65  BILITOT 0.8  PROT 6.4*  ALBUMIN 3.1*   No results for input(s): "LIPASE", "AMYLASE" in the last 168 hours. No results for input(s): "AMMONIA" in the last 168 hours. Coagulation Profile: No results for input(s): "INR", "PROTIME" in the last 168 hours. Cardiac Enzymes: No results for input(s): "CKTOTAL", "CKMB", "CKMBINDEX", "TROPONINI" in the last 168  hours. BNP (last 3 results) No results for input(s): "PROBNP" in the last 8760 hours. HbA1C: No results for input(s): "HGBA1C" in the last 72 hours. CBG: No results for input(s): "GLUCAP" in the last 168 hours. Lipid Profile: No results for input(s): "CHOL", "HDL", "LDLCALC", "TRIG", "CHOLHDL", "LDLDIRECT" in the last 72 hours. Thyroid Function Tests: No results for input(s): "TSH", "T4TOTAL", "FREET4", "T3FREE", "THYROIDAB" in the last 72 hours. Anemia Panel: No results for input(s): "VITAMINB12", "FOLATE", "FERRITIN", "TIBC", "IRON", "RETICCTPCT" in the last 72 hours. Urine analysis:    Component Value Date/Time   COLORURINE AMBER (A) 03/01/2022 1741   APPEARANCEUR HAZY (A) 03/01/2022 1741   LABSPEC 1.013 03/01/2022 1741   PHURINE 5.0 03/01/2022 1741   GLUCOSEU NEGATIVE 03/01/2022 1741   HGBUR NEGATIVE 03/01/2022 1741   BILIRUBINUR NEGATIVE 03/01/2022 1741   KETONESUR NEGATIVE 03/01/2022 1741   PROTEINUR NEGATIVE 03/01/2022 1741   NITRITE NEGATIVE 03/01/2022 1741   LEUKOCYTESUR NEGATIVE 03/01/2022 1741    Radiological Exams on Admission: MR LUMBAR SPINE WO CONTRAST  Result Date: 03/01/2022 CLINICAL DATA:  Weakness, inability to walk EXAM: MRI LUMBAR SPINE WITHOUT CONTRAST TECHNIQUE: Multiplanar, multisequence MR imaging of the lumbar spine was performed. No intravenous contrast was administered. COMPARISON:  No prior MRI lumbar spine available, correlation is made with PET-CT 10/27/2020 and CT abdomen pelvis 11/10/2019 FINDINGS: Evaluation is somewhat limited by motion artifact. The patient declined to repeat motion limited sequences. Segmentation: 5 lumbar type vertebral bodies. Alignment:  Mild dextrocurvature.  No significant listhesis. Vertebrae: Diffusely heterogeneous and increased marrow signal, consistent with the patient's history of prostate cancer. No acute fracture. Conus medullaris and cauda equina: Conus extends to the L2 level. Conus and cauda equina appear normal.  Paraspinal and other soft tissues: Large right renal cyst, for which no follow-up is currently indicated. No lymphadenopathy in the field of view. Disc levels: T12-L1: No significant disc bulge. Mild facet arthropathy. No spinal canal stenosis or neural foraminal narrowing. L1-L2: Minimal disc bulge. Mild facet arthropathy. No spinal canal stenosis or neural foraminal narrowing. L2-L3: Minimal disc bulge. Mild facet arthropathy. No spinal  canal stenosis or neural foraminal narrowing. L3-L4: Minimal disc bulge. Mild facet arthropathy. No spinal canal stenosis or neural foraminal narrowing. L4-L5: Minimal disc bulge. Mild-to-moderate facet arthropathy. No spinal canal stenosis or neural foraminal narrowing. L5-S1: Minimal disc bulge with small central protrusion. No spinal canal stenosis or neural foraminal narrowing. IMPRESSION: 1. No spinal canal stenosis or neural foraminal narrowing. 2. Diffusely heterogeneous and decreased marrow signal, consistent with the patient's history of prostate cancer. Electronically Signed   By: Merilyn Baba M.D.   On: 03/01/2022 19:23   MR BRAIN WO CONTRAST  Addendum Date: 03/01/2022   ADDENDUM REPORT: 03/01/2022 19:17 ADDENDUM: Correction- Skull and upper cervical spine: Prior right parietal craniotomy. Heterogeneous calvarial marrow signal, consistent patient's history of prostate cancer. Electronically Signed   By: Merilyn Baba M.D.   On: 03/01/2022 19:17   Result Date: 03/01/2022 CLINICAL DATA:  Immobility, weakness, unable to walk; stroke suspected EXAM: MRI HEAD WITHOUT CONTRAST TECHNIQUE: Multiplanar, multiecho pulse sequences of the brain and surrounding structures were obtained without intravenous contrast. COMPARISON:  No prior MRI head, correlation is made with CT head 03/01/2022 FINDINGS: Evaluation is somewhat limited by motion artifact. Brain: No restricted diffusion to suggest acute or subacute infarct.No acute hemorrhage, mass, mass effect, or midline shift. No  hydrocephalus or extra-axial collection.No hemosiderin deposition to suggest remote hemorrhage.Normal pituitary. Low lying cerebellar tonsils, which extend 1-2 mm below the foramen magnum and do not meet criteria for Chiari I malformation.Mild age related cerebral atrophy. Vascular: Patent arterial flow voids. Skull and upper cervical spine: Prior right parietal craniotomy. Otherwise normal marrow signal. Sinuses/Orbits: Clear paranasal sinuses.No acute finding in the orbits. Other: The mastoid air cells are well aerated. IMPRESSION: No acute intracranial process. No evidence of acute or subacute infarct. Electronically Signed: By: Merilyn Baba M.D. On: 03/01/2022 19:13   CT Head Wo Contrast  Result Date: 03/01/2022 CLINICAL DATA:  Right-sided weakness EXAM: CT HEAD WITHOUT CONTRAST TECHNIQUE: Contiguous axial images were obtained from the base of the skull through the vertex without intravenous contrast. RADIATION DOSE REDUCTION: This exam was performed according to the departmental dose-optimization program which includes automated exposure control, adjustment of the mA and/or kV according to patient size and/or use of iterative reconstruction technique. COMPARISON:  Previous studies including the examination of 02/14/2016 FINDINGS: Brain: No acute intracranial findings are seen. Cortical sulci are prominent. There is no focal edema or mass effect. Vascular: Scattered arterial calcifications are seen. Skull: There is previous right frontoparietal craniotomy. Sinuses/Orbits: Unremarkable. Other: None. IMPRESSION: No acute intracranial findings are seen in noncontrast CT brain. Atrophy. Electronically Signed   By: Elmer Picker M.D.   On: 03/01/2022 10:06   DG Chest 2 View  Result Date: 03/01/2022 CLINICAL DATA:  Weakness. Loss of balance. History of prostate cancer. EXAM: CHEST - 2 VIEW COMPARISON:  AP chest 06/16/2021 and 05/17/2021; CT chest 05/16/2021 FINDINGS: Right chest wall porta catheter is  again seen with tip overlying the central superior vena cava. There appears to be resolution of the prior small left pleural effusion seen on 06/16/2021. Slight interval increase in small right pleural effusion and associated right basilar atelectasis compared to 06/16/2021. No pneumothorax. 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 Remote moderate to severe T3 compression fracture better seen on prior CT. IMPRESSION: 1. Slight interval increase in small right pleural effusion and associated right basilar atelectasis compared to 06/16/2021. 2. Interval resolution of the prior small left pleural effusion. Electronically Signed   By: Viann Fish.D.  On: 03/01/2022 09:55    EKG: Independently reviewed.   Assessment/Plan  Progressive debility now with acute progression of lower extremity weakness over last 2 days. -neuro imaging unrevealing , no obvious lesion noted  -infectious work up also unrevealing  -? Paraneoplastic syndrome  -continue to monitor on neuro checks  - oncology to see in am  -f/u  with complete respiratory panel   ? GI bleed -drop in h/h with hx of blood in stools 2 days prior  -start on ppi iv bid -check fob  -cycle h/h    Anemia of blood loss -in setting of gi bleed -drop in h/h from 10..8 to 8.3  -check iron stores  -cycle h/h -transfuse if  < 7   Hypotension -due to volume status -resolved s/p bolus /albumin  -continue maintenance ivfs   End stage Prostate Cancer  -palliative care consult  -family  notes preference for hospice and palliative  -per wife they would be willing to treat infection but their primary goal is for comfort care and to be at home   DVT prophylaxis: SCD Code Status: DNR / as discussed with wife  Family Communication: wife at bedside Disposition Plan: patient  expected to be admitted greater than 2 midnights  Consults called: palliative care Admission status: inpatient    Clance Boll MD [facility]   If 7PM-7AM, please contact night-coverage [email or web address] Password [email or web address]  03/01/2022, 9:00 PM

## 2022-03-01 NOTE — ED Triage Notes (Signed)
EMS stated, He started having immobility for 2 days ago. Unable to put weight on legs without assistance and can not bear weight.

## 2022-03-01 NOTE — ED Notes (Signed)
EDP made aware of pt Bps. 1l NS bolus ordered.

## 2022-03-01 NOTE — ED Notes (Signed)
Dr Marcello Moores made aware of BP and reports understanding

## 2022-03-01 NOTE — ED Notes (Signed)
EDP at bedside  

## 2022-03-01 NOTE — ED Notes (Signed)
Dr Marcello Moores at bedside at this time

## 2022-03-01 NOTE — ED Provider Triage Note (Signed)
Emergency Medicine Provider Triage Evaluation Note  [name] , a 77 y.o. male  was evaluated in triage.  Pt complains of weaknessx2 weeks. Can't seem to walk per patient given weakness. Current prostate cancer patient. Last chemo 1 year ago. Reports chronic swelling of legs and tremor. Has fallen several times at home, endorses head trauma. Denies any new pain after falls.   Review of Systems  Positive: weakness Negative: Urinary symptoms  Physical Exam  There were no vitals taken for this visit. Gen:   Awake, no distress   Resp:  Normal effort  MSK:   Moves extremities without difficulty  Other:  1+ pitting edema of BLE  Medical Decision Making  Medically screening exam initiated at 9:18 AM.  Appropriate orders placed.  [name] was informed that the remainder of the evaluation will be completed by another provider, this initial triage assessment does not replace that evaluation, and the importance of remaining in the ED until their evaluation is complete.    [name], [place] 03/01/22 [phone]

## 2022-03-01 NOTE — ED Notes (Signed)
Pt was taken from triage and never brought back. Looking for pt now

## 2022-03-01 NOTE — ED Notes (Signed)
Pt denies any pain at this time. Stated has felt weak on right foot for a few days but today could not use right foot at all. Pt has pitting edema noted to right foot. Denies any pain. Pt is able to feel sensation in foot. Pt provided with warm blanket. Wife at bedside. Call bell in reach. No needs requested at this time

## 2022-03-01 NOTE — ED Notes (Signed)
Pt still in MRI 

## 2022-03-02 ENCOUNTER — Inpatient Hospital Stay (HOSPITAL_COMMUNITY): Payer: Medicare Other

## 2022-03-02 DIAGNOSIS — I10 Essential (primary) hypertension: Secondary | ICD-10-CM

## 2022-03-02 DIAGNOSIS — R7989 Other specified abnormal findings of blood chemistry: Secondary | ICD-10-CM

## 2022-03-02 DIAGNOSIS — R9431 Abnormal electrocardiogram [ECG] [EKG]: Secondary | ICD-10-CM | POA: Diagnosis not present

## 2022-03-02 DIAGNOSIS — I959 Hypotension, unspecified: Secondary | ICD-10-CM | POA: Diagnosis present

## 2022-03-02 DIAGNOSIS — Z7189 Other specified counseling: Secondary | ICD-10-CM

## 2022-03-02 DIAGNOSIS — E861 Hypovolemia: Secondary | ICD-10-CM

## 2022-03-02 DIAGNOSIS — I9589 Other hypotension: Secondary | ICD-10-CM

## 2022-03-02 DIAGNOSIS — R5381 Other malaise: Secondary | ICD-10-CM

## 2022-03-02 DIAGNOSIS — D649 Anemia, unspecified: Secondary | ICD-10-CM | POA: Diagnosis present

## 2022-03-02 DIAGNOSIS — R531 Weakness: Secondary | ICD-10-CM

## 2022-03-02 DIAGNOSIS — Z515 Encounter for palliative care: Secondary | ICD-10-CM | POA: Diagnosis not present

## 2022-03-02 DIAGNOSIS — Z66 Do not resuscitate: Secondary | ICD-10-CM

## 2022-03-02 DIAGNOSIS — C61 Malignant neoplasm of prostate: Secondary | ICD-10-CM

## 2022-03-02 DIAGNOSIS — Z86718 Personal history of other venous thrombosis and embolism: Secondary | ICD-10-CM

## 2022-03-02 DIAGNOSIS — R29898 Other symptoms and signs involving the musculoskeletal system: Secondary | ICD-10-CM | POA: Diagnosis not present

## 2022-03-02 LAB — RETICULOCYTES
Immature Retic Fract: 23.1 % — ABNORMAL HIGH (ref 2.3–15.9)
RBC.: 2.81 MIL/uL — ABNORMAL LOW (ref 4.22–5.81)
Retic Count, Absolute: 51.4 10*3/uL (ref 19.0–186.0)
Retic Ct Pct: 1.8 % (ref 0.4–3.1)

## 2022-03-02 LAB — HEMOGLOBIN AND HEMATOCRIT, BLOOD
HCT: 25.6 % — ABNORMAL LOW (ref 39.0–52.0)
Hemoglobin: 8.3 g/dL — ABNORMAL LOW (ref 13.0–17.0)

## 2022-03-02 LAB — IRON AND TIBC
Iron: 61 ug/dL (ref 45–182)
Saturation Ratios: 26 % (ref 17.9–39.5)
TIBC: 231 ug/dL — ABNORMAL LOW (ref 250–450)
UIBC: 170 ug/dL

## 2022-03-02 LAB — PROCALCITONIN: Procalcitonin: <0.1 ng/mL

## 2022-03-02 LAB — ECHOCARDIOGRAM COMPLETE
AR max vel: 3.5 cm2
AV Area VTI: 3.2 cm2
AV Area mean vel: 3.61 cm2
AV Mean grad: 4 mmHg
AV Peak grad: 7 mmHg
Ao pk vel: 1.32 m/s
Area-P 1/2: 2.9 cm2
Height: 75 in
S' Lateral: 3 cm
Weight: 3296 oz

## 2022-03-02 LAB — VITAMIN B12: Vitamin B-12: 558 pg/mL (ref 180–914)

## 2022-03-02 LAB — TYPE AND SCREEN
ABO/RH(D): A NEG
Antibody Screen: NEGATIVE

## 2022-03-02 LAB — D-DIMER, QUANTITATIVE: D-Dimer, Quant: 10.07 ug/mL-FEU — ABNORMAL HIGH (ref 0.00–0.50)

## 2022-03-02 LAB — FOLATE: Folate: >40 ng/mL (ref >5.9)

## 2022-03-02 LAB — HEMOGLOBIN
Hemoglobin: 8.8 g/dL — ABNORMAL LOW (ref 13.0–17.0)
Hemoglobin: 8.3 g/dL — ABNORMAL LOW (ref 13.0–17.0)

## 2022-03-02 MED ORDER — SODIUM CHLORIDE 0.9 % IV BOLUS
1000.0000 mL | Freq: Once | INTRAVENOUS | Status: AC
  Administered 2022-03-02: 1000 mL via INTRAVENOUS

## 2022-03-02 MED ORDER — HEPARIN BOLUS VIA INFUSION
4000.0000 [IU] | Freq: Once | INTRAVENOUS | Status: AC
  Administered 2022-03-02: 4000 [IU] via INTRAVENOUS
  Filled 2022-03-02: qty 4000

## 2022-03-02 MED ORDER — HEPARIN (PORCINE) 25000 UT/250ML-% IV SOLN
1300.0000 [IU]/h | INTRAVENOUS | Status: AC
  Administered 2022-03-02 – 2022-03-04 (×3): 1400 [IU]/h via INTRAVENOUS
  Filled 2022-03-02 (×3): qty 250

## 2022-03-02 MED ORDER — IOHEXOL 350 MG/ML SOLN
50.0000 mL | Freq: Once | INTRAVENOUS | Status: AC | PRN
  Administered 2022-03-02: 50 mL via INTRAVENOUS

## 2022-03-02 MED ORDER — PERFLUTREN LIPID MICROSPHERE
1.0000 mL | INTRAVENOUS | Status: AC | PRN
  Administered 2022-03-02: 2 mL via INTRAVENOUS

## 2022-03-02 NOTE — ED Notes (Signed)
Took over pt care at this time 

## 2022-03-02 NOTE — ED Notes (Signed)
Echo at bedside

## 2022-03-02 NOTE — ED Notes (Signed)
Pt's wife came to this RN expressing frustration about pt not having food. I explained that pt has an order for NPO and that I would reach out to Rai MD. Per pt's wife, Rai MD said pt could eat. Pt's wife expressing frustration about differing things said to her by different providers. Pt's wife also said that she had been silencing the monitor in the room and that Rai MD told her she could do that. Explained to pt's wife that only staff are allowed to touch the monitors.

## 2022-03-02 NOTE — ED Notes (Signed)
Per Dr Manon Hilding conversation with the patient's wife; patient would like to be a DNR.

## 2022-03-02 NOTE — Progress Notes (Signed)
Patient arrived at the unit,CCMD notified,Vitals checked,patient oriented to the unit,No complaints of pain.

## 2022-03-02 NOTE — Progress Notes (Signed)
ANTICOAGULATION CONSULT NOTE - Initial Consult  Pharmacy Consult for heparin  Indication: DVT  Allergies  Allergen Reactions   Penicillins Rash and Other (See Comments)    Has patient had a PCN reaction causing immediate rash, facial/tongue/throat swelling, SOB or lightheadedness with hypotension: YES Has patient had a PCN reaction causing severe rash involving mucus membranes or skin necrosis: YES Has patient had a PCN reaction that required hospitalization: NO Has patient had a PCN reaction occurring within the last 10 years: NO     Patient Measurements: Height: '6\' 3"'$  (190.5 cm) Weight: 93.4 kg (206 lb) IBW/kg (Calculated) : 84.5 Heparin Dosing Weight: 93.4kg   Vital Signs: Temp: 98.1 F (36.7 C) (01/05 0934) Temp Source: Oral (01/05 0934) BP: 105/54 (01/05 1400) Pulse Rate: 63 (01/05 1400)  Labs: Recent Labs    03/01/22 0928 03/01/22 1741 03/01/22 2221 03/01/22 2310 03/02/22 0405 03/02/22 0835  HGB 10.8*  --  8.6* 8.3* 8.8* 8.3*  HCT 32.8*  --  27.2* 25.6*  --   --   PLT 126*  --  104*  --   --   --   CREATININE 0.98  --  1.03  --   --   --   TROPONINIHS 7 7  --   --   --   --     Estimated Creatinine Clearance: 75.2 mL/min (by C-G formula based on SCr of 1.03 mg/dL).   Medical History: Past Medical History:  Diagnosis Date   Benign essential hypertension 03/04/2017   Hx of radiation therapy    Prostate cancer [facility])    Assessment: Patient admitted with CC of weakness and inability to put weight on legs. Found to have DVT. Not on anticoagulation PTA. Did receive 1x dose subq heparin > 12 hours ago. Hgb trending down to 8.3, platelets also downtrended to 104.   Pharmacy consulted to dose heparin.    Goal of Therapy:  Heparin level 0.3-0.7 units/ml Monitor platelets by anticoagulation protocol: Yes   Plan:  Give 4000 units bolus x 1 Start heparin infusion at 1400 units/hr Check anti-Xa level in 8 hours and daily while on heparin Continue to monitor  H&H and platelets  Esmeralda Arthur, PharmD, BCCCP  03/02/2022,2:35 PM

## 2022-03-02 NOTE — Progress Notes (Signed)
Events in the last 24 hours noted.  Patient known to me with advanced prostate cancer now with end stage status.  He was hospitalized with lower extremity weakness, failure to thrive and deep vein thrombosis.  His evaluation including an MRI of the brain and the spine did not show any acute pathology.  Neurology consultation is currently pending.   At this time, I do not see any acute intervention is needed from an oncology standpoint.  I do not see any role for radiation therapy given his diffuse nature of metastasis.  Hospice services has already been involved and I recommend discharging home with hospice once it is approved by the primary team.  I do not see any immediate or urgent need to treat his deep vein thrombosis given his overall poor prognosis.  His left expectancy is very limited and I do not think treating his deep vein thrombosis will affect his quality of life.  Please call with any questions regarding this patient

## 2022-03-02 NOTE — Evaluation (Signed)
Clinical/Bedside Swallow Evaluation Patient Details  Name: [name] MRN: [id number] Date of Birth: September 09, 1945  Today's Date: 03/02/2022 Time: SLP Start Time (ACUTE ONLY): 69 SLP Stop Time (ACUTE ONLY): 1400 SLP Time Calculation (min) (ACUTE ONLY): 14 min  Past Medical History:  Past Medical History:  Diagnosis Date   Benign essential hypertension 03/04/2017   Hx of radiation therapy    Prostate cancer [facility])    Past Surgical History:  Past Surgical History:  Procedure Laterality Date   BURR HOLE Right 09/14/2015   Procedure: Right craniotomy for subdural ;  Surgeon: Kary Kos, MD;  Location: [facility];  Service: Neurosurgery;  Laterality: Right;   IR IMAGING GUIDED PORT INSERTION  11/21/2020   PROSTATE BIOPSY     PROSTATECTOMY     HPI:  Pt is a 77 y.o. male who presented with acute progression of weakness and inability to bear weight. MRI brain negative. 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 Oral mechanism exam was [facility] and he presented with adequate, natural dentition. He tolerated all solids and liquids without signs or symptoms of oropharyngeal dysphagia. A regular texture diet with thin liquids is recommended at this time and further skilled SLP services are not clinically indicated for swallowing. SLP Visit Diagnosis: Dysphagia, unspecified (R13.10)    Aspiration Risk  No limitations    Diet Recommendation Regular;Thin liquid   Liquid Administration via: Cup;Straw Medication Administration: Whole meds with liquid Supervision: Staff to assist with self feeding Postural Changes: Seated upright at 90 degrees    Other  Recommendations Oral Care Recommendations: Oral care BID    Recommendations  for follow up therapy are one component of a multi-disciplinary discharge planning process, led by the attending physician.  Recommendations may be updated based on patient status, additional functional criteria and insurance authorization.  Follow up Recommendations No SLP follow up      Assistance Recommended at Discharge    Functional Status Assessment Patient has not had a recent decline in their functional status  Frequency and Duration            Prognosis        Swallow Study   General Date of Onset: 03/01/22 HPI: Pt is a 77 y.o. male who presented with acute progression of weakness and inability to bear weight. MRI brain negative. 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, s/p prostatectomy and lymph node dissection and radiation therapy/ Type of Study: Bedside Swallow Evaluation Previous Swallow Assessment: none Diet Prior to this Study: Dysphagia 3 (soft);Thin liquids Temperature Spikes Noted: No Respiratory Status: Room air History of Recent Intubation: No Behavior/Cognition: Alert;Pleasant mood;Cooperative Oral Cavity Assessment: Within Functional Limits Oral Care Completed by SLP: No Oral Cavity - Dentition: Adequate natural dentition Vision: Functional for self-feeding Self-Feeding Abilities: Needs assist Patient Positioning: Upright in bed;Postural control adequate for testing Baseline Vocal Quality: Normal Volitional Cough: Weak Volitional Swallow: Able to elicit    Oral/Motor/Sensory Function Overall Oral Motor/Sensory Function: Within functional limits   Ice Chips Ice chips: Within functional limits Presentation: Spoon   Thin Liquid Thin Liquid: Within functional limits Presentation: Straw;Cup    Nectar Thick Nectar Thick Liquid: Not tested   Honey Thick Honey Thick Liquid: Not tested   Puree Puree: Within  functional limits Presentation: Spoon   Solid     Solid: Within functional  limits Presentation: [place] [place] Negus, [place], [place] Office number [phone]  Horton Marshall 03/02/2022,2:03 PM

## 2022-03-02 NOTE — Progress Notes (Addendum)
[facility]                                                                              [name], is a 77 y.o. male, DOB - [birth year], WVP:[id number] Admit date - 03/01/2022    Outpatient Primary MD for the patient is George Blade, MD  LOS - 1  days  Chief Complaint  Patient presents with   immoblity   Weakness   unable to walk       Brief summary   Patient is a 77 year old with HTN, castration-resistant advanced prostate ca with mets to bone with Gleason score 7 and PSA 6.45 and localized disease in 2003, s/p prostatectomy and lymph node dissection and radiation therapy, with progressive decline noted over the last recent weeks. Per wife, over the last 2 days has acute progression of weakness and is unable to bear weight on his legs, right side LE worse. Due to persistent of these symptoms and her inability to take care of him at home patient was brought to ED by EMS.   Patient was admitted for observation and further workup.  Neurology, oncology and palliative medicine consulted.   Assessment & Plan    Principal Problem:   Lower extremity weakness, R>L -Unclear etiology, MRI brain negative for acute CVA, MRI lumbar spine showed no significant spinal stenosis or neuroforaminal narrowing.  Has history of prostrate CA, no malignant lesions in lumbar spine -Elevated D-dimer, will obtain Doppler ultrasound of the lower extremities to rule out DVT -Neurology consulted, will await recommendations -PT OT evaluation pending Addendum: 2:30 PM Venous Dopplers: Showed right gastrocnemius acute DVT, and then left proximal to mid femoral vein has evidence of chronic, non-obstructive  -Placed on IV heparin drip, if H&H remained stable, no acute issues, will change to apixaban in a.m. -TOC for benefit check for apixaban  Active Problems:   Malignant neoplasm of prostate ([place]), failure to thrive, generalized debility -Oncology, palliative medicine consulted,  appreciate goals of care -PT OT evaluation pending  Hypotension with history of benign essential hypertension -BP soft, SBP in 90s, continue IV fluid hydration, received IV albumin. -Hold Lasix, amlodipine   Normocytic anemia -Baseline hemoglobin ~10 -Hemoglobin down to 8.3, likely due to hemodilution from IV fluids -No obvious bleeding, check FOBT, transfuse for hemoglobin less than 7  Elevated D dimer  -D-dimer 10.07 -CT angiogram chest showed no acute PE -Follow Doppler ultrasound of the lower extremities to rule out DVT  Resting tremor -Patient's wife requested neurology evaluation   Code Status: DNR DVT Prophylaxis:  SCDs   Level of Care: Level of care: Progressive Family Communication: Updated patient's wife at the bed side   Disposition Plan:      Remains inpatient appropriate: Pending Doppler ultrasound lower extremities.  Pending neurology, oncology, palliative medicine recommendation.  PT OT evaluation   Procedures:  MRI brain, MRI lumbar spine, CTA chest  Consultants:   Neurology,  Antimicrobials:   Anti-infectives (From admission, onward)    None          Medications  aspirin EC  81 mg Oral Daily   pantoprazole (PROTONIX) IV  40  mg Intravenous Q12H      Subjective:   [name] was seen and examined today AM.  Complaining of lower extremity weakness, right >left.  No acute chest pain or shortness of breath, abdominal pain, nausea or vomiting.+ Generalized weakness with debility, tremor, difficulty ambulation.  Wife at the bedside.    Objective:   Vitals:   03/02/22 0615 03/02/22 0630 03/02/22 0700 03/02/22 0730  BP: (!) 92/48 (!) 102/51 (!) 99/50 114/62  Pulse: (!) 47 (!) 105 (!) 51 (!) 102  Resp: 18 16 (!) 22 (!) 24  Temp:      TempSrc:      SpO2: 96% 96% 98% 99%  Weight:      Height:       No intake or output data in the 24 hours ending 03/02/22 0819   Wt Readings from Last 3 Encounters:  03/01/22 93.4 kg  02/12/22  93.2 kg  12/18/21 95.5 kg     Exam General: Alert and oriented x 3, NAD Cardiovascular: S1 S2 auscultated,  RRR Respiratory: Clear to auscultation bilaterally, no wheezing Gastrointestinal: Soft, nontender, nondistended, + bowel sounds Ext: no pedal edema bilaterally Neuro: Strength 5/5 in upper and lower extremity left, right LE 3/5 ,UE 4-5/5, right hand tremor Psych: Normal affect and demeanor, alert and oriented x3     Data Reviewed:  I have personally reviewed following labs    CBC Lab Results  Component Value Date   WBC 4.1 03/01/2022   RBC 2.81 (L) 03/02/2022   HGB 8.3 (L) 03/01/2022   HCT 25.6 (L) 03/01/2022   MCV 97.1 03/01/2022   MCH 30.7 03/01/2022   PLT 104 (L) 03/01/2022   MCHC 31.6 03/01/2022   RDW 16.1 (H) 03/01/2022   LYMPHSABS 1.0 03/01/2022   MONOABS 0.4 03/01/2022   EOSABS 0.1 03/01/2022   BASOSABS 0.0 24/23/5361     Last metabolic panel Lab Results  Component Value Date   NA 135 03/01/2022   K 3.7 03/01/2022   CL 97 (L) 03/01/2022   CO2 29 03/01/2022   BUN 22 03/01/2022   CREATININE 1.03 03/01/2022   GLUCOSE 111 (H) 03/01/2022   GFRNONAA >60 03/01/2022   GFRAA >60 11/10/2019   CALCIUM 8.8 (L) 03/01/2022   PHOS 3.4 09/17/2015   PROT 6.4 (L) 03/01/2022   ALBUMIN 3.1 (L) 03/01/2022   BILITOT 0.8 03/01/2022   ALKPHOS 65 03/01/2022   AST 18 03/01/2022   ALT 12 03/01/2022   ANIONGAP 9 03/01/2022    CBG (last 3)  No results for input(s): "GLUCAP" in the last 72 hours.    Coagulation Profile: No results for input(s): "INR", "PROTIME" in the last 168 hours.   Radiology Studies: I have personally reviewed the imaging studies  CT Angio Chest Pulmonary Embolism (PE) W or WO Contrast  Result Date: 03/02/2022 CLINICAL DATA:  Weakness and shortness of breath EXAM: CT ANGIOGRAPHY CHEST WITH CONTRAST TECHNIQUE: Multidetector CT imaging of the chest was performed using the standard protocol during bolus administration of intravenous contrast.  Multiplanar CT image reconstructions and MIPs were obtained to evaluate the vascular anatomy. RADIATION DOSE REDUCTION: This exam was performed according to the departmental dose-optimization program which includes automated exposure control, adjustment of the mA and/or kV according to patient size and/or use of iterative reconstruction technique. CONTRAST:  16m OMNIPAQUE IOHEXOL 350 MG/ML SOLN COMPARISON:  Chest x-ray from the previous day. FINDINGS: Cardiovascular: Right chest wall port is noted. Dilatation of the ascending aorta to 4.5 cm is noted.  No dissection is noted. The pulmonary artery shows a normal branching pattern bilaterally. No filling defect to suggest pulmonary embolism is noted. Heart is at the upper limits of normal in size. No coronary calcifications are seen. Mediastinum/Nodes: Esophagus is well visualized and within normal limits. Lymph nodes are noted in lower neck and supraclavicular region particularly on the left the largest of these is seen on image number 65 of series 7 measuring 13 mm in short axis. Scattered mediastinal nodes are noted. The largest of these lies adjacent to the main pulmonary artery on the left measuring 18 mm in short axis. Subcarinal and paraesophageal lymph nodes are noted. The peer esophageal lymph node measures at least 2 cm in short axis. These are best visualized on image number 225 of series 7. Lungs/Pleura: Mild left basilar atelectasis is noted. No sizable effusion is noted. Mild nodularity is noted in the left upper lobe best seen on image number 73 of series 6. No other sizable nodule is noted in the left lung. Calcified granuloma is noted in the right upper lobe stable from the prior exam. Large right-sided pleural effusion is seen. Right lower lobe nodule is again identified best seen on image number 57 of series 6 stable in appearance from the prior exam. No new nodules are seen. Upper Abdomen: Visualized upper abdomen shows a large right renal cyst  measuring up to 6.3 cm. This is stable from prior exam from September of 2022 and benign in appearance. No follow-up is recommended. Musculoskeletal: Bony structures demonstrate diffuse sclerotic foci consistent with metastatic prostate carcinoma. Stable T3 compression deformity is noted. Review of the MIP images confirms the above findings. IMPRESSION: Changes consistent with metastatic prostate carcinoma with multiple sclerotic lesions and a chronic T3 compression deformity. No evidence of pulmonary emboli. Dilatation of the ascending aorta to 4.5 cm. Ascending thoracic aortic aneurysm. Recommend semi-annual imaging followup by CTA or MRA and referral to cardiothoracic surgery if not already obtained. This recommendation follows 2010 ACCF/AHA/AATS/ACR/ASA/SCA/SCAI/SIR/STS/SVM Guidelines for the Diagnosis and Management of Patients With Thoracic Aortic Disease. Circulation. 2010; 121: Z662-H476. Aortic aneurysm NOS (ICD10-I71.9) Large right-sided pleural effusion. Stable right lower lobe nodule. Mediastinal and left supraclavicular adenopathy. This has progressed in the interval from the prior exam but more similar to that seen on prior PET-CT from 10/27/2020. Need for further evaluation by means of PET-CT can be determined on a clinical basis. Electronically Signed   By: Inez Catalina M.D.   On: 03/02/2022 02:48   MR LUMBAR SPINE WO CONTRAST  Result Date: 03/01/2022 CLINICAL DATA:  Weakness, inability to walk EXAM: MRI LUMBAR SPINE WITHOUT CONTRAST TECHNIQUE: Multiplanar, multisequence MR imaging of the lumbar spine was performed. No intravenous contrast was administered. COMPARISON:  No prior MRI lumbar spine available, correlation is made with PET-CT 10/27/2020 and CT abdomen pelvis 11/10/2019 FINDINGS: Evaluation is somewhat limited by motion artifact. The patient declined to repeat motion limited sequences. Segmentation: 5 lumbar type vertebral bodies. Alignment:  Mild dextrocurvature.  No significant  listhesis. Vertebrae: Diffusely heterogeneous and increased marrow signal, consistent with the patient's history of prostate cancer. No acute fracture. Conus medullaris and cauda equina: Conus extends to the L2 level. Conus and cauda equina appear normal. Paraspinal and other soft tissues: Large right renal cyst, for which no follow-up is currently indicated. No lymphadenopathy in the field of view. Disc levels: T12-L1: No significant disc bulge. Mild facet arthropathy. No spinal canal stenosis or neural foraminal narrowing. L1-L2: Minimal disc bulge. Mild facet arthropathy. No spinal  canal stenosis or neural foraminal narrowing. L2-L3: Minimal disc bulge. Mild facet arthropathy. No spinal canal stenosis or neural foraminal narrowing. L3-L4: Minimal disc bulge. Mild facet arthropathy. No spinal canal stenosis or neural foraminal narrowing. L4-L5: Minimal disc bulge. Mild-to-moderate facet arthropathy. No spinal canal stenosis or neural foraminal narrowing. L5-S1: Minimal disc bulge with small central protrusion. No spinal canal stenosis or neural foraminal narrowing. IMPRESSION: 1. No spinal canal stenosis or neural foraminal narrowing. 2. Diffusely heterogeneous and decreased marrow signal, consistent with the patient's history of prostate cancer. Electronically Signed   By: Merilyn Baba M.D.   On: 03/01/2022 19:23   MR BRAIN WO CONTRAST  Addendum Date: 03/01/2022   ADDENDUM REPORT: 03/01/2022 19:17 ADDENDUM: Correction- Skull and upper cervical spine: Prior right parietal craniotomy. Heterogeneous calvarial marrow signal, consistent patient's history of prostate cancer. Electronically Signed   By: Merilyn Baba M.D.   On: 03/01/2022 19:17   Result Date: 03/01/2022 CLINICAL DATA:  Immobility, weakness, unable to walk; stroke suspected EXAM: MRI HEAD WITHOUT CONTRAST TECHNIQUE: Multiplanar, multiecho pulse sequences of the brain and surrounding structures were obtained without intravenous contrast. COMPARISON:   No prior MRI head, correlation is made with CT head 03/01/2022 FINDINGS: Evaluation is somewhat limited by motion artifact. Brain: No restricted diffusion to suggest acute or subacute infarct.No acute hemorrhage, mass, mass effect, or midline shift. No hydrocephalus or extra-axial collection.No hemosiderin deposition to suggest remote hemorrhage.Normal pituitary. Low lying cerebellar tonsils, which extend 1-2 mm below the foramen magnum and do not meet criteria for Chiari I malformation.Mild age related cerebral atrophy. Vascular: Patent arterial flow voids. Skull and upper cervical spine: Prior right parietal craniotomy. Otherwise normal marrow signal. Sinuses/Orbits: Clear paranasal sinuses.No acute finding in the orbits. Other: The mastoid air cells are well aerated. IMPRESSION: No acute intracranial process. No evidence of acute or subacute infarct. Electronically Signed: By: Merilyn Baba M.D. On: 03/01/2022 19:13   CT Head Wo Contrast  Result Date: 03/01/2022 CLINICAL DATA:  Right-sided weakness EXAM: CT HEAD WITHOUT CONTRAST TECHNIQUE: Contiguous axial images were obtained from the base of the skull through the vertex without intravenous contrast. RADIATION DOSE REDUCTION: This exam was performed according to the departmental dose-optimization program which includes automated exposure control, adjustment of the mA and/or kV according to patient size and/or use of iterative reconstruction technique. COMPARISON:  Previous studies including the examination of 02/14/2016 FINDINGS: Brain: No acute intracranial findings are seen. Cortical sulci are prominent. There is no focal edema or mass effect. Vascular: Scattered arterial calcifications are seen. Skull: There is previous right frontoparietal craniotomy. Sinuses/Orbits: Unremarkable. Other: None. IMPRESSION: No acute intracranial findings are seen in noncontrast CT brain. Atrophy. Electronically Signed   By: Elmer Picker M.D.   On: 03/01/2022 10:06    DG Chest 2 View  Result Date: 03/01/2022 CLINICAL DATA:  Weakness. Loss of balance. History of prostate cancer. EXAM: CHEST - 2 VIEW COMPARISON:  AP chest 06/16/2021 and 05/17/2021; CT chest 05/16/2021 FINDINGS: Right chest wall porta catheter is again seen with tip overlying the central superior vena cava. There appears to be resolution of the prior small left pleural effusion seen on 06/16/2021. Slight interval increase in small right pleural effusion and associated right basilar atelectasis compared to 06/16/2021. No pneumothorax. 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 Remote moderate to severe T3 compression fracture better seen on prior CT. IMPRESSION: 1. Slight interval increase in small right pleural effusion and associated right basilar atelectasis compared to 06/16/2021. 2. Interval resolution of the  prior small left pleural effusion. Electronically Signed   By: Yvonne Kendall M.D.   On: 03/01/2022 09:55       Hadassah Rana M.D. [facility] 03/02/2022, 8:19 AM  Available via Epic secure chat 7am-7pm After 7 pm, please refer to night coverage provider listed on amion.

## 2022-03-02 NOTE — ED Notes (Signed)
Patient transported to CT 

## 2022-03-02 NOTE — ED Notes (Signed)
Provider notified of d dimer and HGB

## 2022-03-02 NOTE — ED Notes (Signed)
Tray being ordered by Network engineer

## 2022-03-02 NOTE — Progress Notes (Signed)
CCMD notified patient heart rate went up to 200 B/M and it came back to 70s.patient remains asymptomatic,MD paged ,awaiting for the orders.

## 2022-03-02 NOTE — Progress Notes (Signed)
Manufacturing engineer [facility]) Hospital Liaison Note   This is a pending [place] hospice patient. We will follow while inpatient to assist with obtaining an admission visit at discharge. Please call with any questions or concerns. Thank you  Roselee Nova, [facility] Liaison [phone]

## 2022-03-02 NOTE — Progress Notes (Signed)
PT Cancellation Note  Patient Details Name: DEVONTAE CASASOLA MRN: [id number] DOB: 02/18/1946   Cancelled Treatment:    Reason Eval/Treat Not Completed: Medical issues which prohibited therapy Pt with new LE DVT.  Will hold PT today for anticoagulation per PT guidelines. Abran Richard, PT Acute Rehab [facility] [phone]   Karlton Lemon 03/02/2022, 12:25 PM

## 2022-03-02 NOTE — ED Notes (Signed)
Assisted to bedpan at this time; male purewick connected and placed on suction

## 2022-03-02 NOTE — ED Notes (Signed)
Messaged Rai MD to ask about diet order and pt eating. Awaiting for response

## 2022-03-02 NOTE — Consult Note (Signed)
Neurology Consultation Reason for Consult: Hx prostate Ca, admitted with LE Weakness, R>L. MRI brain, L spine neg. Family requesting neurology eval.   Requesting Physician: Dr. Tana Coast   CC: LE weakness, R>L  History is obtained from:patient, wife  HPI: [name] is a 77 y.o. male with a PMHx of prostate cancer s/p prostatectomy, LN dissection, radiation therapy, R parietal craniotomy 2/2 subdural hematoma, h/o DVTs and PE who presents to the ED 2/2 increased difficulty with ambulation and gait.   Patient reports that he has resting tremor in R hand for over 2 years. He reports that he has noticed difficulty with R leg over the last 2 weeks. Reported he had to start using a walker last week. Wife notes 3 falls within the past year. He has had some difficulty with ambulation over the past year though he attributes this to his peripheral neuropathy. He denies any family history of Parkinson's. Denies any difficulty with standing up out of a chair or getting out of car until 2 weeks ago. Reports that there have been times over the past year when he felt unsteady. Most recent was last week when he toppled back. Does not note any head trauma with these falls. He denies any dysarthria or dysphagia. Denies any memory issues. He denies any chest pain, SOB, issues with urination or constipation. He denies any headaches. Reports his baseline is independent, reports he was able to take shower and play in shows until 2 weeks ago. Discussed MRI brain and MRI L spine findings with patient and wife at bedside.   No anosmia, no REM sleep behavior disorder (did have some sleep disturbances with one of his chemo medications which resolved with discontinuation of that medication), no constipation other than when taking iron tablets,  He does not know if his tremor is improved with alcohol as he does not drink alcohol.  He rarely takes caffeine so is unsure if his tremors worsened by caffeine but it is worsened with  anxiety.  He notes he has been told he has essential tremor by nonneurology physicians  ROS: All other review of systems was negative except as noted in the HPI.   Past Medical History:  Diagnosis Date   Benign essential hypertension 03/04/2017   Hx of radiation therapy    Prostate cancer [facility])     Family History  Problem Relation Age of Onset   Skin cancer Father    Thyroid cancer Brother    Throat cancer Brother    Prostate cancer Neg Hx     Social History:  reports that he has never smoked. He has never used smokeless tobacco. He reports current alcohol use. He reports that he does not use drugs. Patient is a Therapist, nutritional, plays guitar. Lives with wife at home.    Exam: Current vital signs: BP 114/62   Pulse (!) 102   Temp 98 F (36.7 C) (Oral)   Resp (!) 24   Ht '6\' 3"'$  (1.905 m)   Wt 93.4 kg   SpO2 99%   BMI 25.75 kg/m  Vital signs in last 24 hours: Temp:  [97.9 F (36.6 C)-98.5 F (36.9 C)] 98 F (36.7 C) (01/05 0400) Pulse Rate:  [47-105] 102 (01/05 0730) Resp:  [15-25] 24 (01/05 0730) BP: (76-122)/(39-110) 114/62 (01/05 0730) SpO2:  [90 %-100 %] 99 % (01/05 0730) Weight:  [93.4 kg] 93.4 kg (01/04 1049)  Physical Exam  Constitutional: Appears well-developed and well-nourished.  Psych: Affect appropriate to situation, anxious. Eyes: No  scleral injection HENT: No oropharyngeal obstruction.  MSK: no joint deformities.  Cardiovascular: Normal rate. Perfusing extremities well Respiratory: Effort normal, non-labored breathing GI: Soft.  No distension.  Skin: Warm dry and intact visible skin  Neuro: Mental Status: Patient is awake, alert, oriented to person, place, and situation. Patient is able to give a clear and coherent history. No signs of aphasia or neglect Cranial Nerves: II: Visual Fields are full. Pupils are equal, round, and reactive to light.  III,IV, VI: EOMI without ptosis or diploplia.  V: Facial sensation is symmetric to light touch. VII: Facial  movement is symmetric.  VIII: hearing is intact to voice X: Uvula elevates symmetrically XI: Shoulder shrug is symmetric. XII: tongue is midline without atrophy or fasciculations.  Motor: Bulk is normal.  Resting tremor noted with R hand, but also some tremor of the left upper extremity.  No cogwheeling noted in BUE.  5/5 strength in L hip flexion, plantar flexion  2/5 strength in R hip flexion, knee extension   4/5 strength for R plantar flexion  Sensory: Sensation is symmetric to light touch in the arms and legs.  Length dependent loss of temperature sensation.  Cerebellar: FNF is intact bilaterally. No tremor noted with action.  Reflexes: 2+ patellar bilaterally Gait:  Deferred in acute setting   I have reviewed labs in epic and the results pertinent to this consultation are: TIBC 231  Hgb 8.3 Plt 104 D dimer 10.07 Ca 8.8 Albumin 3.1  I have reviewed the images obtained: MRI brain negative for acute stroke, skull with R parietal craniotomy  MRI L spine negative for spinal stenosis or neural foraminal narrowing, however there is a large right renal cyst that does appear to be causing a mass effect on the local musculature  Impression:  [name] is a 77 year old male with PMHx of prostate cancer s/p prostatectomy, LN dissection, radiation therapy, R parietal craniotomy 2/2 subdural hematoma, h/o DVTs and PE who presents with R leg weakness and difficulty with ambulation and gait. Prior concern for essential tremor patient's outpatient providers given his resting tremor and difficulty with ambulation.  Favor parkinsonism over Parkinson's disease given lack of other cardinal features of Parkinson's.  He has also had difficulty with ambulation at least for the past year though acutely worsened over the past 2 weeks. No bulbar dysfunction or dementia. Given his metastatic prostate cancer, muscle wasting from malignancy is also a consideration for his increased difficulty  with ambulation and muscle weakness; asymmetry could be secondary to malignant involvement of peripheral nerves.   There may be a component of gait impairment secondary to his right renal cyst given the fact this may have on his hip flexors  Recommendations: - consider a trial of primidone for possible essential tremor if patient desires - Management of right renal cyst per primary team - Given planned transition to hospice care, no role for extensive diagnostics  Rolanda Lundborg, MD, PGY-1  Attending Neurologist's note:  I personally saw this patient, gathering history, performing a full neurologic examination, reviewing relevant labs, personally reviewing relevant imaging including MRI brain and MRI lumbar spine, and formulated the assessment and plan, adding the note above for completeness and clarity to accurately reflect my thoughts

## 2022-03-02 NOTE — Consult Note (Signed)
Palliative Care Consult Note                                  Date: 03/02/2022   Patient Name: [name]  DOB: 15-Jan-1946  MRN: [id number]  Age / Sex: 77 y.o., male  PCP: [name], MD Referring Physician: Mendel Corning, MD  Reason for Consultation: Establishing goals of care  HPI/Patient Profile: 77 y.o. 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  He presented to the emergency room via EMS for progressive weakness of the lower extremities and unable to bear weight.  He was admitted on 03/01/2022 with progressive debility now with acute progression of lower extremity weakness, GI bleed/anemia blood loss, end-stage prostate cancer, and others.   PMT was consulted for [place] conversations.  Past Medical History:  Diagnosis Date   Benign essential hypertension 03/04/2017   Hx of radiation therapy    Prostate cancer (HCC)     Subjective:   This NP Walden Field reviewed medical records, received report from team, assessed the patient and then meet at the patient's bedside to discuss diagnosis, prognosis, GOC, EOL wishes disposition and options.  I met with the patient at the bedside.  His wife was also present..   Concept of Palliative Care was introduced as specialized medical care for people and their families living with serious illness.  If focuses on providing relief from the symptoms and stress of a serious illness.  The goal is to improve quality of life for both the patient and the family. Values and goals of care important to patient and family were attempted to be elicited.  Created space and opportunity for patient  and family to  explore thoughts and feelings regarding current medical situation   Natural trajectory and current clinical status were discussed. Questions and concerns addressed. Patient  encouraged to call with questions or concerns.    Patient/Family Understanding of Illness: They understand he has been having trouble with mobility and has been getting worse.  He called his primary care with the symptoms and they recommended going to the emergency room.  His right foot was noted to be more dragging.  At baseline he generally ambulates with a walker.  He has he has end-stage prostate cancer.  He states he has had 20 years of treatment which has been debilitating.  He has had 7 doses of chemo, plan for time but had to stop due to side effects.  He understands that there are no real treatment options available.  Life Review: He is married to his wife [place].  In his free time he enjoys playing the guitar.  Patient Values: Independence, comfort  Goals: Patient and wife stated goals are to discharge from the emergency room and go home with hospice.  Hospice is already been notified by oncology and he was supposed to have an evaluation today but ended up in the emergency room.  The patient and his wife are hoping to have this rescheduled soon.  Today's Discussion: In addition to discussions described above we had various extensive discussion on multiple topics.  They have had a lot of issues during his stay in  the emergency room.  Because of his tremor his alarm monitor keeps beeping.  This is been very frustrating to them.  He also complains that he was n.p.o. for multiple hours both in the waiting room and in the emergency room and a bed.  He was told that discharging from the emergency room was possible.  After asking multiple times in multiple ways they expressed that their wish, if there is nothing "life-threatening" that requires absolutely admission and inpatient stay, that they would like to discharge home with  hospice from the emergency room.  They have already spoken with [facility] and there is a planned evaluation for home hospice, although not he is in the emergency room and will have to be rescheduled.  He previously was seeing Dr. Alen Blew who is retiring soon and he states that he was told his new oncologist will be Dr. Lorenso Courier.  However he again emphasized that there are essentially no treatment options available.  At the end of our meeting but he from ultrasound came to get him for a bilateral lower extremity DVT ultrasound.  I excuse myself to allow him to be taking ultrasound.  I informed him if he does end up admitted into the hospital we would continue to follow along.  They verbalized appreciation.  I provided emotional and general support through therapeutic listening, empathy, sharing of stories, and other techniques. I answered all questions and addressed all concerns to the best of my ability.  Review of Systems  Constitutional:  Positive for fatigue.  Respiratory:  Negative for cough and shortness of breath.   Cardiovascular:  Negative for chest pain.  Gastrointestinal:  Negative for abdominal pain, nausea and vomiting.  Neurological:  Positive for weakness (Bilateral LE).    Objective:   Primary Diagnoses: Present on Admission:  Lower extremity weakness  Hypotension  Benign essential hypertension  Malignant neoplasm of prostate (HCC)  Debility  Normocytic anemia   Physical Exam Vitals and nursing note reviewed.  Constitutional:      General: He is not in acute distress.    Appearance: He is ill-appearing.  HENT:     Head: Normocephalic and atraumatic.  Cardiovascular:     Rate and Rhythm: Normal rate.  Pulmonary:     Effort: Pulmonary effort is normal. No respiratory distress.     Breath sounds: No wheezing or rhonchi.  Abdominal:     General: Abdomen is flat.     Palpations: Abdomen is soft.  Skin:    General: Skin is warm and dry.  Neurological:      General: No focal deficit present.     Mental Status: He is alert.  Psychiatric:        Mood and Affect: Mood normal.        Behavior: Behavior normal.     Vital Signs:  BP (!) 111/56   Pulse (!) 50   Temp 98.1 F (36.7 C) (Oral)   Resp (!) 26   Ht '6\' 3"'$  (1.905 m)   Wt 93.4 kg   SpO2 98%   BMI 25.75 kg/m   Palliative Assessment/Data: 40-50%    Advanced Care Planning:   Existing Vynca/ACP Documentation: None  Primary Decision Maker: PATIENT  Code Status/Advance Care Planning: DNR  A discussion was had today regarding advanced directives. Concepts specific to code status, artifical feeding and hydration, continued IV antibiotics and rehospitalization was had.  The difference between a aggressive medical intervention path and a palliative comfort care path for this patient at  this time was had.  They wish for discharge home with home hospice.  Decisions/Changes to ACP: Remain DNR Consult with [facility] to ensure that they are aware of the patient  Assessment & Plan:   Impression: 77 year old male with chronic comorbidities and acute presentation as described above.  He came into the emergency department for lower extremity weakness.  Ongoing workup.  They have previously been recommended hospice and have contacted [facility] and should have had an evaluation but he is now in the emergency room.  They are hoping this can be rescheduled soon.  Overall they feel that if there is no absolute need for hospital stay they would like to go home with home hospice.  I contacted [facility] and they are aware of him and are moderating his inpatient status.  SUMMARY OF RECOMMENDATIONS   Remain DNR Discharge home with home hospice when possible If he remains inpatient PMT will continue to follow Goals are clear at this time  Symptom Management:  Per primary team PMT is available to assist as needed  Prognosis:  Unable to  determine  Discharge Planning:  Home with Hospice   Discussed with: Medical team, nursing team, patient, patient's family    Thank you for allowing [place] to participate in the care of DONACIANO RANGE PMT will continue to support holistically.  Time Total: 95 min  Greater than 50%  of this time was spent counseling and coordinating care related to the above assessment and plan.  Signed by: Walden Field, NP Palliative Medicine Team  Team Phone # [phone] (Nights/Weekends)  03/02/2022, 11:04 AM

## 2022-03-02 NOTE — ED Notes (Signed)
Marcello Moores, MD notified of patient's hypotension. NS bolus ordered at this time

## 2022-03-02 NOTE — Progress Notes (Signed)
  Echocardiogram 2D Echocardiogram has been performed.  [name] 03/02/2022, 4:02 PM

## 2022-03-02 NOTE — ED Notes (Signed)
MD at bedside. 

## 2022-03-02 NOTE — Progress Notes (Signed)
Lower extremity venous bilateral study completed.  Preliminary results relayed to Rai, MD.  See CV Proc for preliminary results report.   Darlin Coco, RDMS, RVT

## 2022-03-03 ENCOUNTER — Inpatient Hospital Stay (HOSPITAL_COMMUNITY): Payer: Medicare Other

## 2022-03-03 DIAGNOSIS — Z66 Do not resuscitate: Secondary | ICD-10-CM | POA: Diagnosis not present

## 2022-03-03 DIAGNOSIS — Z515 Encounter for palliative care: Secondary | ICD-10-CM | POA: Diagnosis not present

## 2022-03-03 DIAGNOSIS — R29898 Other symptoms and signs involving the musculoskeletal system: Secondary | ICD-10-CM | POA: Diagnosis not present

## 2022-03-03 DIAGNOSIS — Z7189 Other specified counseling: Secondary | ICD-10-CM | POA: Diagnosis not present

## 2022-03-03 DIAGNOSIS — I82493 Acute embolism and thrombosis of other specified deep vein of lower extremity, bilateral: Secondary | ICD-10-CM | POA: Diagnosis not present

## 2022-03-03 LAB — CBC
HCT: 24.6 % — ABNORMAL LOW (ref 39.0–52.0)
Hemoglobin: 8.2 g/dL — ABNORMAL LOW (ref 13.0–17.0)
MCH: 31.5 pg (ref 26.0–34.0)
MCHC: 33.3 g/dL (ref 30.0–36.0)
MCV: 94.6 fL (ref 80.0–100.0)
Platelets: 108 10*3/uL — ABNORMAL LOW (ref 150–400)
RBC: 2.6 MIL/uL — ABNORMAL LOW (ref 4.22–5.81)
RDW: 16.2 % — ABNORMAL HIGH (ref 11.5–15.5)
WBC: 5.2 10*3/uL (ref 4.0–10.5)
nRBC: 0 % (ref 0.0–0.2)

## 2022-03-03 LAB — BASIC METABOLIC PANEL
Anion gap: 6 (ref 5–15)
BUN: 23 mg/dL (ref 8–23)
CO2: 25 mmol/L (ref 22–32)
Calcium: 8.3 mg/dL — ABNORMAL LOW (ref 8.9–10.3)
Chloride: 109 mmol/L (ref 98–111)
Creatinine, Ser: 1.02 mg/dL (ref 0.61–1.24)
GFR, Estimated: >60 mL/min (ref >60)
Glucose, Bld: 112 mg/dL — ABNORMAL HIGH (ref 70–99)
Potassium: 3.8 mmol/L (ref 3.5–5.1)
Sodium: 140 mmol/L (ref 135–145)

## 2022-03-03 LAB — HEPARIN LEVEL (UNFRACTIONATED)
Heparin Unfractionated: 0.59 IU/mL (ref 0.30–0.70)
Heparin Unfractionated: 0.55 IU/mL (ref 0.30–0.70)

## 2022-03-03 MED ORDER — POLYETHYLENE GLYCOL 3350 17 G PO PACK: 17.0000 g | PACK | Freq: Every day | ORAL | Status: DC | PRN

## 2022-03-03 MED ORDER — LIDOCAINE HCL (PF) 1 % IJ SOLN
INTRAMUSCULAR | Status: AC
  Filled 2022-03-03: qty 30

## 2022-03-03 MED ORDER — SENNOSIDES-DOCUSATE SODIUM 8.6-50 MG PO TABS
1.0000 | ORAL_TABLET | Freq: Two times a day (BID) | ORAL | Status: DC
  Administered 2022-03-03 (×2): 1 via ORAL
  Filled 2022-03-03 (×2): qty 1

## 2022-03-03 NOTE — Evaluation (Signed)
Occupational Therapy Evaluation Patient Details Name: [name] MRN: [id number] DOB: 02/08/1946 Today's Date: 03/03/2022   History of Present Illness Pt is a 77 y.o. male who presented 03/01/22 with progressive weakness and inability to bear weight in his legs. 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 S/p right thoracentesis 1/6. Pt with end-stage prostate cancer. PMH: HTN, prostate cancer s/p prostatectomy, LN dissection, radiation therapy, R parietal craniotomy 2/2 subdural hematoma, h/o DVTs and PE   Clinical Impression   Pt admitted with the above diagnoses and presents with below problem list. Pt will benefit from continued acute OT to address the below listed deficits and maximize independence with basic ADLs prior to d/c home with spouse. PTA pt was mod I with ADLs, uses walker. Pt able to stand from EOB 3x this session. Improved performance using Stedy vs walker. Min A +2 with Stedy to stand. Mod-max A +2 with bed mobility, max A +2 with LB ADLs. Spouse present throughout session.        Recommendations for follow up therapy are one component of a multi-disciplinary discharge planning process, led by the attending physician.  Recommendations may be updated based on patient status, additional functional criteria and insurance authorization.   Follow Up Recommendations  Home health OT     Assistance Recommended at Discharge Frequent or constant Supervision/Assistance  Patient can return home with the following Two people to help with walking and/or transfers;A lot of help with bathing/dressing/bathroom;Two people to help with bathing/dressing/bathroom;Assistance with cooking/housework;Assist for transportation;Help with stairs or ramp for entrance    Functional Status Assessment  Patient has had a recent decline in their functional status and demonstrates the ability to make significant improvements in function in a reasonable and predictable amount of time.  Equipment  Recommendations  BSC/3in1;Hospital bed;Wheelchair (measurements OT);Wheelchair cushion (measurements OT);Other (comment) [name])    Recommendations for Other Services       Precautions / Restrictions Precautions Precautions: Fall Restrictions Weight Bearing Restrictions: No      Mobility Bed Mobility Overal bed mobility: Needs Assistance Bed Mobility: Supine to Sit, Sit to Supine     Supine to sit: Mod assist, Max assist, +2 for physical assistance, +2 for safety/equipment, HOB elevated Sit to supine: +2 for physical assistance, Max assist   General bed mobility comments: assist to advance RLE, powerup trunk. Therapists used bed pad to help pivot hips to full EOB position.    Transfers Overall transfer level: Needs assistance Equipment used: Rolling walker (2 wheels), Ambulation equipment used Transfers: Sit to/from Stand Sit to Stand: Min assist, Mod assist, From elevated surface           General transfer comment: Struggled coming to full standing using walker and +2 assist. Smoother transitions with use of Stedy and +2 assist. min-mod +2 as pt fatigued. Spouse present      Balance Overall balance assessment: Needs assistance Sitting-balance support: Bilateral upper extremity supported, Feet supported Sitting balance-[name]: Poor Sitting balance - Comments: cued to place elbows on knees for improved stability 2/2 tendency towards posterior lean. BUE support needed in static sitting Postural control: Posterior lean   Standing balance-[name]: Zero Standing balance comment: Steady and +2 min A for static standing                           ADL either performed or assessed with clinical judgement   ADL  Vision         Perception     Praxis      Pertinent Vitals/Pain Pain Assessment Pain Assessment: Faces Faces Pain Nichols: Hurts even more Pain Location: back with bed  mobility and transfers Pain Descriptors / Indicators: Grimacing, Guarding, Discomfort Pain Intervention(s): Limited activity within patient's tolerance, Monitored during session, Repositioned     Hand Dominance     Extremity/Trunk Assessment Upper Extremity Assessment Upper Extremity Assessment: Generalized weakness;RUE deficits/detail RUE Deficits / Details: tremor at rest, present for about 2 years per pt report.   Lower Extremity Assessment Lower Extremity Assessment: Defer to PT evaluation       Communication     Cognition Arousal/Alertness: Awake/alert Behavior During Therapy: WFL for tasks assessed/performed Overall Cognitive Status: Within Functional Limits for tasks assessed                                       General Comments  Spouse present and included in education, involved throughout session    Exercises     Shoulder Instructions      Home Living Family/patient expects to be discharged to:: Private residence Living Arrangements: Spouse/significant other Available Help at Discharge: Family;Available 24 hours/day;Neighbor (wife and neighbors) Type of Home: House Home Access: Stairs to enter [employer] of Steps: 2-3 Entrance Stairs-Rails: Left (ascending) Home Layout: One level     Bathroom Shower/Tub: Teacher, early years/pre: Handicapped height     Home Equipment: Civil engineer, contracting;Tub bench;Grab bars - tub/shower;Grab bars - toilet;Rolling Walker (2 wheels);Other (comment) (1 rail on bed)          Prior Functioning/Environment               Mobility Comments: Was mod I with RW, but in the past week got very weak. Pt last stood on 02/28/22. Hx of falls. ADLs Comments: Mod I with ADLs. Plays guitar. Tremor in R hand x2 year hx.        OT Problem List: Decreased strength;Decreased activity tolerance;Impaired balance (sitting and/or standing);Decreased knowledge of use of DME or AE;Decreased knowledge of  precautions;Pain      OT Treatment/Interventions: Self-care/ADL training;Balance training;Patient/family education;Therapeutic activities;DME and/or AE instruction;Therapeutic exercise    OT Goals(Current goals can be found in the care plan section) Acute Rehab OT Goals Patient Stated Goal: home, maintain independence and activity as long as possible OT Goal Formulation: With patient/family Time For Goal Achievement: 03/17/22 Potential to Achieve Goals: Good ADL Goals Pt Will Perform Grooming: with min guard assist;sitting Pt Will Perform Upper Body Dressing: with min guard assist;sitting Pt Will Perform Lower Body Dressing: with mod assist;sit to/from stand;with max assist;sitting/lateral leans Pt Will Transfer to Toilet: stand pivot transfer;with mod assist;bedside commode Pt Will Perform Toileting - Clothing Manipulation and hygiene: with mod assist;sit to/from stand;sitting/lateral leans;with min assist Pt/caregiver will Perform Home Exercise Program: Both right and left upper extremity;With theraband;With written HEP provided;With Supervision Additional ADL Goal #1: Pt will complete bed mobility at min A level to prepare for EOB/OOB ADLs  OT Frequency: Min 2X/week    Co-evaluation              AM-PAC OT "6 Clicks" Daily Activity     Outcome Measure Help from another person eating meals?: A Little Help from another person taking care of personal grooming?: A Little Help from another person toileting, which includes using toliet, bedpan, or  urinal?: Total Help from another person bathing (including washing, rinsing, drying)?: Total Help from another person to put on and taking off regular upper body clothing?: A Lot Help from another person to put on and taking off regular lower body clothing?: Total 6 Click Score: 11   End of Session Equipment Utilized During Treatment: Other (comment);Rolling walker (2 wheels) [name]) Nurse Communication: Mobility status;Other (comment)  (used Stedy)  Activity Tolerance: Patient tolerated treatment well;Patient limited by fatigue;Patient limited by pain Patient left: in bed;with call bell/phone within reach;with bed alarm set;with family/visitor present  OT Visit Diagnosis: Unsteadiness on feet (R26.81);Muscle weakness (generalized) (M62.81);Pain;Other abnormalities of gait and mobility (R26.89);Other symptoms and signs involving the nervous system (R29.898)                Time: 5638-9373 OT Time Calculation (min): 46 min Charges:  OT General Charges $OT Visit: 1 Visit OT Evaluation $OT Eval Moderate Complexity: 1 Mod OT Treatments $Self Care/Home Management : 8-22 mins  Tyrone Schimke, OT Acute Rehabilitation Services Office: [phone]   Hortencia Pilar 03/03/2022, 4:28 PM

## 2022-03-03 NOTE — Evaluation (Signed)
Physical Therapy Evaluation Patient Details Name: [name] MRN: [id number] DOB: 29-Jun-1945 Today's Date: 03/03/2022  History of Present Illness  Pt is a 77 y.o. male who presented 03/01/22 with progressive weakness and inability to bear weight in his legs. 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 S/p right thoracentesis 1/6. Pt with end-stage prostate cancer. PMH: HTN, prostate cancer s/p prostatectomy, LN dissection, radiation therapy, R parietal craniotomy 2/2 subdural hematoma, h/o DVTs and PE   Clinical Impression  Pt presents with condition above and deficits mentioned below, see PT Problem List. PTA, he was mod I using a RW for mobility, living with his wife in a 1-level house with 2-3 STE. Currently, pt is demonstrating deficits in static and dynamic sitting and standing balance, activity tolerance, bil lower extremity strength and sensation (R more affected than L), bil UE weakness, and power. He is requiring mod-maxAx2 for bed mobility and heavy modAx2 to transfer to stand from EOB with a RW and bil knees blocked. He was able to transfer to stand with improved ease, independence, and safety using the stedy, only needing min-modA (+2 for safety). He is at high risk for falls and pressure ulcers. Of note, redness and slight opening of skin noted at sacrum, thus applied sacral foam and notified RN. Requested prevalon boots to be ordered by RN to prevent pressure wounds at heels. Educated pt and his wife on pressure relief/positioning to prevent pressure wounds. Will continue to follow acutely. Per chart and pt/family, the plan is for him to return home with his wife thus recommending HHPT follow-up. Pt is unable to take steps to ambulate and thus cannot navigate the x2-3 STE his home, thus recommending PTAR transport home.      Recommendations for follow up therapy are one component of a multi-disciplinary discharge planning process, led by the attending physician.  Recommendations may be  updated based on patient status, additional functional criteria and insurance authorization.  Follow Up Recommendations Home health PT (and PTAR transport home)      Assistance Recommended at Discharge Frequent or constant Supervision/Assistance  Patient can return home with the following  Two people to help with walking and/or transfers;A lot of help with bathing/dressing/bathroom;Assistance with cooking/housework;Assist for transportation;Help with stairs or ramp for entrance    Equipment Recommendations BSC/3in1;Wheelchair (measurements PT);Wheelchair cushion (measurements PT);Hospital bed (with air mattress; stedy; hoyer lift)  Recommendations for Other Services       Functional Status Assessment Patient has had a recent decline in their functional status and demonstrates the ability to make significant improvements in function in a reasonable and predictable amount of time.     Precautions / Restrictions Precautions Precautions: Fall Restrictions Weight Bearing Restrictions: No      Mobility  Bed Mobility Overal bed mobility: Needs Assistance Bed Mobility: Supine to Sit, Sit to Supine     Supine to sit: Mod assist, +2 for physical assistance, +2 for safety/equipment, HOB elevated Sit to supine: +2 for physical assistance, Max assist, +2 for safety/equipment, HOB elevated   General bed mobility comments: ModAx2 to manage each leg (R>L) off L EOB and ascend trunk to sit up, using bed pad to scoot hips to EOB. MaxAx2 to direct trunk and lift legs back to supine.    Transfers Overall transfer level: Needs assistance Equipment used: Rolling walker (2 wheels), Ambulation equipment used Transfers: Sit to/from Stand Sit to Stand: Min assist, Mod assist, From elevated surface, +2 physical assistance, +2 safety/equipment  General transfer comment: Heavy modAx2 with bil knee block and assistance to extend kness (R>L) to come to stand from elevated EOB to RW, cuing pt  to push down on RW with arms to extend trunk, eventually successful in obtaining upright posture with max cues. Min-modAx2 to come to stand using the stedy with improves initiation and hip extension noted by pt, x2 reps (1 from EOB and 1 from stedy flaps).    Ambulation/Gait               General Gait Details: unable  Stairs            Wheelchair Mobility    Modified Rankin (Stroke Patients Only)       Balance Overall balance assessment: Needs assistance Sitting-balance support: Bilateral upper extremity supported, Feet supported Sitting balance-Leahy Scale: Poor Sitting balance - Comments: cued to place elbows on knees for improved stability 2/2 tendency towards posterior lean. BUE support needed in static sitting Postural control: Posterior lean Standing balance support: Bilateral upper extremity supported, Reliant on assistive device for balance, During functional activity Standing balance-Leahy Scale: Zero Standing balance comment: Steady and +2 min A for static standing, modAx2 with knees blocked for static standing in RW                             Pertinent Vitals/Pain Pain Assessment Pain Assessment: Faces Faces Pain Scale: Hurts even more Pain Location: back with bed mobility and transfers Pain Descriptors / Indicators: Grimacing, Guarding, Discomfort Pain Intervention(s): Limited activity within patient's tolerance, Monitored during session, Repositioned    Home Living Family/patient expects to be discharged to:: Private residence Living Arrangements: Spouse/significant other Available Help at Discharge: Family;Available 24 hours/day;Neighbor (wife and neighbors) Type of Home: House Home Access: Stairs to enter Entrance Stairs-Rails: Left (ascending) Entrance Stairs-Number of Steps: 2-3   Home Layout: One level Home Equipment: Shower seat;Tub bench;Grab bars - tub/shower;Grab bars - toilet;Rolling Walker (2 wheels);Other (comment) (1 rail  on bed)      Prior Function Prior Level of Function : Independent/Modified Independent             Mobility Comments: Was mod I with RW, but in the past week got very weak. Pt last stood on 02/28/22. Hx of falls. ADLs Comments: Mod I with ADLs. Plays guitar. Tremor in R hand x2 year hx.     Hand Dominance        Extremity/Trunk Assessment   Upper Extremity Assessment Upper Extremity Assessment: Defer to OT evaluation RUE Deficits / Details: tremor at rest, present for about 2 years per pt report.    Lower Extremity Assessment Lower Extremity Assessment: RLE deficits/detail;LLE deficits/detail RLE Deficits / Details: R lower extremity weaker than L; 2+ in quads, 2+ in hip flexors, 1 in anterior tibialis and gastrocs; numbness bil (R>L) RLE Sensation: decreased light touch RLE Coordination: decreased fine motor;decreased gross motor LLE Deficits / Details: Grossly 3- to 3 in strength; numbness bil (R>L) LLE Sensation: decreased light touch LLE Coordination: decreased fine motor;decreased gross motor    Cervical / Trunk Assessment Cervical / Trunk Assessment: Kyphotic  Communication   Communication: No difficulties  Cognition Arousal/Alertness: Awake/alert Behavior During Therapy: WFL for tasks assessed/performed Overall Cognitive Status: Within Functional Limits for tasks assessed  General Comments General comments (skin integrity, edema, etc.): Spouse present and included in education (particularly in pressure relief), involved throughout session; noted redness and start of open wound at sacrum, applied sacral foam and notified RN, asked RN to order prevalon boots    Exercises     Assessment/Plan    PT Assessment Patient needs continued PT services  PT Problem List Decreased strength;Decreased activity tolerance;Decreased balance;Decreased mobility;Impaired sensation;Decreased skin integrity       PT  Treatment Interventions DME instruction;Gait training;Functional mobility training;Therapeutic activities;Therapeutic exercise;Balance training;Neuromuscular re-education;Patient/family education;Wheelchair mobility training    PT Goals (Current goals can be found in the Care Plan section)  Acute Rehab PT Goals Patient Stated Goal: to get stronger PT Goal Formulation: With patient/family Time For Goal Achievement: 03/17/22 Potential to Achieve Goals: Fair    Frequency Min 3X/week     Co-evaluation PT/OT/SLP Co-Evaluation/Treatment: Yes Reason for Co-Treatment: For patient/therapist safety;To address functional/ADL transfers PT goals addressed during session: Mobility/safety with mobility;Balance;Proper use of DME         AM-PAC PT "6 Clicks" Mobility  Outcome Measure Help needed turning from your back to your side while in a flat bed without using bedrails?: A Lot Help needed moving from lying on your back to sitting on the side of a flat bed without using bedrails?: Total Help needed moving to and from a bed to a chair (including a wheelchair)?: Total Help needed standing up from a chair using your arms (e.g., wheelchair or bedside chair)?: Total Help needed to walk in hospital room?: Total Help needed climbing 3-5 steps with a railing? : Total 6 Click Score: 7    End of Session Equipment Utilized During Treatment: Gait belt Activity Tolerance: Patient tolerated treatment well Patient left: in bed;with call bell/phone within reach;with bed alarm set;with family/visitor present Nurse Communication: Mobility status;Need for lift equipment;Other (comment) (redness at sacrum, need for prevalon boots) PT Visit Diagnosis: Unsteadiness on feet (R26.81);Muscle weakness (generalized) (M62.81);Difficulty in walking, not elsewhere classified (R26.2)    Time: 4627-0350 PT Time Calculation (min) (ACUTE ONLY): 49 min   Charges:   PT Evaluation $PT Eval Moderate Complexity: 1 Mod           Moishe Spice, PT, DPT Acute Rehabilitation Services  Office: [phone]   Orvan Falconer 03/03/2022, 5:31 PM

## 2022-03-03 NOTE — Procedures (Signed)
PROCEDURE SUMMARY:  Successful US guided right thoracentesis. Yielded 1.2L of pleural fluid. Pt tolerated procedure well. No immediate complications.  Specimen not sent for labs. CXR ordered.  EBL < 5 mL  Lena Gores PA-C 03/03/2022 11:32 AM

## 2022-03-03 NOTE — Plan of Care (Signed)

## 2022-03-03 NOTE — Progress Notes (Signed)
PROGRESS NOTE    [name]  TWS:[id number] DOB: 06-22-1945 DOA: 03/01/2022 PCP: [name], MD    Brief Narrative:   [name] is a 77 y.o. 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  Patient now with worsening lower extremity edema, unable to bear weight on his legs, right lower extremity worse than left.  Due to these persistent symptoms and inability to take care of him at home patient was brought to the ED by EMS.  In the ED, temperature 98.4 F, HR 59, RR 19, BP 102/63, SpO2 98% on room air.  WBC 4.6, hemoglobin 10.8, platelets 126.  Sodium 135, potassium 3.7, chloride 97, CO2 29, glucose 111, BUN 22, creatinine 0.98.  AST 18, ALT 12, total bilirubin 0.8.  High sensitive troponin 7> 7.  ESR 16.  CRP 0.6.  Procalcitonin less than 0.10.  Covid-19 PCR negative.  Influenza A/B PCR negative.  RSV PCR negative.  D-dimer elevated at 10.  CT head without contrast with no acute intracranial findings.  MR brain without contrast with no acute intracranial process.  MR lumbar spine without contrast with no spinal canal stenosis or neural foraminal narrowing.  CT angiogram chest ordered.  TRH consulted for admission for further evaluation management of progressive weakness/decline.  Assessment & Plan:   DVT, bilateral lower extremities Patient presenting to ED with progressive lower extremity edema associated with weakness and inability to ambulate and stand.  D-dimer elevated at 10 in the ED.  CT angiogram chest with large right-sided pleural effusion, stable right lower lobe nodule, mediastinal and left supraclavicular adenopathy which has progressed from prior exam, dilation ascending aorta 4.5 cm, no evidence of PE,  changes consistent with metastatic prostate cancer with multiple sclerotic lesions in the chronic T3 compression deformity.  Vascular duplex ultrasound bilateral lower extremities positive for DVT right gastrocnemius and left femoral vein. -- Heparin drip, anticipate transition to Eliquis tomorrow  Hypotension Hx essential benign HTN BP 113/62 this morning --Continue to hold amlodipine, furosemide -- Continue monitor BP closely  Right pleural effusion Underwent IR thoracentesis on 03/03/2022 with 1.2L pleural fluid removed. -- Consider restart home furosemide at lower dose 40 mg p.o. daily on 1/7  Advanced prostate cancer with metastasis to bone Patient with previous prostatectomy, lymph node dissection and radiation therapy.  Follows with medical oncology outpatient, Dr. Alen Blew.  Per oncology, now with end-stage status and recommending transition to hospice. --Palliative care following, appreciate assistance -- Pending therapy evaluation, but anticipate need for discharge home with home health with outpatient palliative care to follow, anticipate need of hospice services soon  Tremor Seen by neurology, recommended initiation of primidone if needed.  Weakness/debility/gait disturbance/deconditioning: --PT/OT evaluation: Pending   DVT prophylaxis: Heparin drip    Code Status: DNR Family Communication: Spouse present at bedside this morning  Disposition Plan:  Level of care: Progressive Status is: Inpatient Remains inpatient appropriate because: Await PT/OT evaluation, anticipate discharge home with home health services tomorrow with outpatient palliative care to follow    Consultants:  Neurology Palliative care Interventional radiology Medical oncology  Procedures:  Thoracentesis, IR 1/6  Antimicrobials:  None   Subjective: Patient seen examined at bedside, resting comfortably.  Lying in bed.  States his breathing is slightly worse when lying flat, although continues  to  oxygenate well on room air.  Spouse present at bedside.  Multiple questions answered.  Awaiting PT/OT evaluation.  Discussed with recommendations of hospice by his medical oncologist, they wish to see how he can do with therapy first.  Also seen by palliative care today.  Still await therapy evaluation.  Anticipate likely discharge home tomorrow with outpatient follow-up care to follow.  Objective: Vitals:   03/03/22 0759 03/03/22 1051 03/03/22 1102 03/03/22 1227  BP: 115/64 128/88 134/70 123/72  Pulse: 72   62  Resp: 20     Temp: 98.5 F (36.9 C)   98.2 F (36.8 C)  TempSrc: Oral   Oral  SpO2: 95%   97%  Weight:      Height:        Intake/Output Summary (Last 24 hours) at 03/03/2022 1526 Last data filed at 03/03/2022 1521 Gross per 24 hour  Intake 1926.07 ml  Output 1350 ml  Net 576.07 ml   Filed Weights   03/01/22 1049 03/03/22 0405  Weight: 93.4 kg 99.1 kg    Examination:  Physical Exam: GEN: NAD, alert and oriented x 3, chronically ill appearance, appears older than stated age HEENT: NCAT, PERRL, EOMI, sclera clear, MMM PULM: Sounds diminished right base, no wheezing/crackles, normal respiratory effort without accessory muscle use, on room air CV: RRR w/o M/G/R GI: abd soft, NTND, NABS, no R/G/M MSK: + LE pitting peripheral edema bilaterally, right greater than left, moves all extremities independently NEURO: CN II-XII intact, no focal deficits, sensation to light touch intact PSYCH: normal mood/affect Integumentary: dry/intact, no rashes or wounds    Data Reviewed: I have personally reviewed following labs and imaging studies  CBC: Recent Labs  Lab 03/01/22 0928 03/01/22 2221 03/01/22 2310 03/02/22 0405 03/02/22 0835 03/03/22 0149  WBC 4.6 4.1  --   --   --  5.2  NEUTROABS 3.2  --   --   --   --   --   HGB 10.8* 8.6* 8.3* 8.8* 8.3* 8.2*  HCT 32.8* 27.2* 25.6*  --   --  24.6*  MCV 95.1 97.1  --   --   --  94.6  PLT 126* 104*  --   --   --  108*   Basic  Metabolic Panel: Recent Labs  Lab 03/01/22 0928 03/01/22 2221 03/03/22 0149  NA 135  --  140  K 3.7  --  3.8  CL 97*  --  109  CO2 29  --  25  GLUCOSE 111*  --  112*  BUN 22  --  23  CREATININE 0.98 1.03 1.02  CALCIUM 8.8*  --  8.3*   GFR: Estimated Creatinine Clearance: 75.9 mL/min (by C-G formula based on SCr of 1.02 mg/dL). Liver Function Tests: Recent Labs  Lab 03/01/22 0928  AST 18  ALT 12  ALKPHOS 65  BILITOT 0.8  PROT 6.4*  ALBUMIN 3.1*   No results for input(s): "LIPASE", "AMYLASE" in the last 168 hours. No results for input(s): "AMMONIA" in the last 168 hours. Coagulation Profile: No results for input(s): "INR", "PROTIME" in the last 168 hours. Cardiac Enzymes: No results for input(s): "CKTOTAL", "CKMB", "CKMBINDEX", "TROPONINI" in the last 168 hours. BNP (last 3 results) No results for input(s): "PROBNP" in the last 8760 hours. HbA1C: No results for input(s): "HGBA1C" in the last 72 hours. CBG: No results for input(s): "GLUCAP" in the last 168 hours. Lipid Profile: No results for input(s): "CHOL", "HDL", "LDLCALC", "TRIG", "CHOLHDL", "LDLDIRECT" in  the last 72 hours. Thyroid Function Tests: Recent Labs    03/01/22 2221  TSH 3.252   Anemia Panel: Recent Labs    03/01/22 2221 03/02/22 0405  VITAMINB12  --  558  FOLATE  --  >40.0  FERRITIN 102  --   TIBC  --  231*  IRON  --  61  RETICCTPCT  --  1.8   Sepsis Labs: Recent Labs  Lab 03/01/22 2221  PROCALCITON <0.10    Recent Results (from the past 240 hour(s))  Resp panel by RT-PCR (RSV, Flu A&B, Covid) Anterior Nasal Swab     Status: None   Collection Time: 03/01/22  9:35 PM   Specimen: Anterior Nasal Swab  Result Value Ref Range Status   SARS Coronavirus 2 by RT PCR NEGATIVE NEGATIVE Final    Comment: (NOTE) SARS-CoV-2 target nucleic acids are NOT DETECTED.  The SARS-CoV-2 RNA is generally detectable in upper respiratory specimens during the acute phase of infection. The  lowest concentration of SARS-CoV-2 viral copies this assay can detect is 138 copies/mL. A negative result does not preclude SARS-Cov-2 infection and should not be used as the sole basis for treatment or other patient management decisions. A negative result may occur with  improper specimen collection/handling, submission of specimen other than nasopharyngeal swab, presence of viral mutation(s) within the areas targeted by this assay, and inadequate number of viral copies(<138 copies/mL). A negative result must be combined with clinical observations, patient history, and epidemiological information. The expected result is Negative.  Fact Sheet for Patients:  EntrepreneurPulse.com.au  Fact Sheet for Healthcare Providers:  IncredibleEmployment.be  This test is no t yet approved or cleared by the [place] FDA and  has been authorized for detection and/or diagnosis of SARS-CoV-2 by FDA under an Emergency Use Authorization (EUA). This EUA will remain  in effect (meaning this test can be used) for the duration of the COVID-19 declaration under Section 564(b)(1) of the Act, 21 U.S.C.section 360bbb-3(b)(1), unless the authorization is terminated  or revoked sooner.       Influenza A by PCR NEGATIVE NEGATIVE Final   Influenza B by PCR NEGATIVE NEGATIVE Final    Comment: (NOTE) The Xpert Xpress SARS-CoV-2/FLU/RSV plus assay is intended as an aid in the diagnosis of influenza from Nasopharyngeal swab specimens and should not be used as a sole basis for treatment. Nasal washings and aspirates are unacceptable for Xpert Xpress SARS-CoV-2/FLU/RSV testing.  Fact Sheet for Patients: EntrepreneurPulse.com.au  Fact Sheet for Healthcare Providers: IncredibleEmployment.be  This test is not yet approved or cleared by the [place] FDA and has been authorized for detection and/or diagnosis of SARS-CoV-2 by FDA under  an Emergency Use Authorization (EUA). This EUA will remain in effect (meaning this test can be used) for the duration of the COVID-19 declaration under Section 564(b)(1) of the Act, 21 U.S.C. section 360bbb-3(b)(1), unless the authorization is terminated or revoked.     Resp Syncytial Virus by PCR NEGATIVE NEGATIVE Final    Comment: (NOTE) Fact Sheet for Patients: EntrepreneurPulse.com.au  Fact Sheet for Healthcare Providers: IncredibleEmployment.be  This test is not yet approved or cleared by the [place] FDA and has been authorized for detection and/or diagnosis of SARS-CoV-2 by FDA under an Emergency Use Authorization (EUA). This EUA will remain in effect (meaning this test can be used) for the duration of the COVID-19 declaration under Section 564(b)(1) of the Act, 21 U.S.C. section 360bbb-3(b)(1), unless the authorization is terminated or revoked.  Performed at [facility], [place] [address], [place], [place] [address]   Culture, blood (Routine X 2) w Reflex to ID Panel     Status: None (Preliminary result)   Collection Time: 03/01/22 11:11 PM   Specimen: BLOOD  Result Value Ref Range Status   Specimen Description BLOOD SITE NOT SPECIFIED  Final   Special Requests   Final    BOTTLES DRAWN AEROBIC AND ANAEROBIC Blood Culture adequate volume   Culture   Final    NO GROWTH < 12 HOURS Performed at [facility], [place] [address] [place], [place] [address]    Report Status PENDING  Incomplete         Radiology Studies: VAS [place] LOWER EXTREMITY VENOUS (DVT)  Result Date: 03/03/2022  Lower Venous DVT Study Patient Name:  LEVELL TAVANO [facility]  Date of Exam:   03/02/2022 Medical Rec #: [id number]           Accession #:    [id number] Date of Birth: 27-Dec-1945           Patient Gender: M Patient Age:   14 years Exam Location:  [facility] Procedure:      VAS [place] LOWER EXTREMITY VENOUS (DVT) Referring Phys: RIPUDEEP RAI  --------------------------------------------------------------------------------  Indications: Elevated d-dimer >10, bilateral leg weakness. Other Indications: Patient endorses remote history of PE. Risk Factors: Cancer - prostate. Comparison Study: No prior studies. Performing Technologist: Darlin Coco RDMS, RVT  Examination Guidelines: A complete evaluation includes B-mode imaging, spectral Doppler, color Doppler, and power Doppler as needed of all accessible portions of each vessel. Bilateral testing is considered an integral part of a complete examination. Limited examinations for reoccurring indications may be performed as noted. The reflux portion of the exam is performed with the patient in reverse Trendelenburg.  +---------+---------------+---------+-----------+----------+--------------+ RIGHT    CompressibilityPhasicitySpontaneityPropertiesThrombus Aging +---------+---------------+---------+-----------+----------+--------------+ CFV      Full           Yes      Yes                                 +---------+---------------+---------+-----------+----------+--------------+ SFJ      Full                                                        +---------+---------------+---------+-----------+----------+--------------+ FV Prox  Full                                                        +---------+---------------+---------+-----------+----------+--------------+ FV Mid   Full                                                        +---------+---------------+---------+-----------+----------+--------------+ FV DistalFull                                                        +---------+---------------+---------+-----------+----------+--------------+  PFV      Full                                                        +---------+---------------+---------+-----------+----------+--------------+ POP      Full           Yes      Yes                                  +---------+---------------+---------+-----------+----------+--------------+ PTV      Full                                                        +---------+---------------+---------+-----------+----------+--------------+ PERO     Full                                                        +---------+---------------+---------+-----------+----------+--------------+ Gastroc  None           No       No                   Acute          +---------+---------------+---------+-----------+----------+--------------+   +---------+---------------+---------+-----------+---------------+-------------+ LEFT     CompressibilityPhasicitySpontaneityProperties     Thrombus                                                                 Aging         +---------+---------------+---------+-----------+---------------+-------------+ CFV      Full           Yes      Yes                                     +---------+---------------+---------+-----------+---------------+-------------+ SFJ      Full                                                            +---------+---------------+---------+-----------+---------------+-------------+ FV Prox  Partial        Yes      Yes        brightly       Chronic                                                   echogenic                    +---------+---------------+---------+-----------+---------------+-------------+  FV Mid   None           No       No         brightly       Chronic                                                   echogenic                    +---------+---------------+---------+-----------+---------------+-------------+ FV DistalFull           Yes      Yes                                     +---------+---------------+---------+-----------+---------------+-------------+ PFV      Full                                                             +---------+---------------+---------+-----------+---------------+-------------+ POP      Full           Yes      Yes                                     +---------+---------------+---------+-----------+---------------+-------------+ PTV      Full                                                            +---------+---------------+---------+-----------+---------------+-------------+ PERO     Full                                                            +---------+---------------+---------+-----------+---------------+-------------+ Gastroc  Full                                                            +---------+---------------+---------+-----------+---------------+-------------+     Summary: RIGHT: - Findings consistent with acute deep vein thrombosis involving the right gastrocnemius veins. - No cystic structure found in the popliteal fossa.  LEFT: - Findings consistent with acute deep vein thrombosis involving the left femoral vein. - No cystic structure found in the popliteal fossa.  *See table(s) above for measurements and observations. Electronically signed by Orlie Pollen on 03/03/2022 at 1:15:37 PM.    Final    US THORACENTESIS ASP PLEURAL SPACE W/IMG GUIDE  Result Date: 03/03/2022 INDICATION: Metastatic disease, recurrent pleural effusion EXAM: ULTRASOUND GUIDED RIGHT THORACENTESIS MEDICATIONS: None. COMPLICATIONS: None immediate. PROCEDURE: An ultrasound guided thoracentesis  was thoroughly discussed with the patient and questions answered. The benefits, risks, alternatives and complications were also discussed. The patient understands and wishes to proceed with the procedure. Written consent was obtained. Ultrasound was performed to localize and mark an adequate pocket of fluid in the RIGHT chest. The area was then prepped and draped in the normal sterile fashion. 1% Lidocaine was used for local anesthesia. Under ultrasound guidance a 6 Fr Safe-T-Centesis catheter was  introduced. Thoracentesis was performed. The catheter was removed and a dressing applied. FINDINGS: A total of approximately 1.2 L of pleural fluid was removed. Performed and read by Pasty Spillers, PA-C IMPRESSION: Successful ultrasound guided therapeutic RIGHT thoracentesis yielding 1.2L of pleural fluid. Michaelle Birks, MD [facility] Radiology Electronically Signed   By: Michaelle Birks M.D.   On: 03/03/2022 12:51   DG Chest 1 View  Result Date: 03/03/2022 CLINICAL DATA:  Thoracentesis. EXAM: CHEST  1 VIEW COMPARISON:  March 01, 2022 chest x-ray FINDINGS: Known bony metastatic disease. The cardiomediastinal silhouette is stable. A right Port-A-Cath is stable. The right pleural effusion is no longer visualized. No pneumothorax after thoracentesis. Mild interstitial markings in the lungs are similar suggesting edema. No other interval changes or acute abnormalities. IMPRESSION: 1. No pneumothorax after thoracentesis. The right pleural effusion is no longer visualized. 2. Mild pulmonary venous congestion/edema. 3. Known bony metastatic disease. Electronically Signed   By: Dorise Bullion III M.D.   On: 03/03/2022 11:42   ECHOCARDIOGRAM COMPLETE  Result Date: 03/02/2022    ECHOCARDIOGRAM REPORT   Patient Name:   ASKIA HAZELIP Ellerson Date of Exam: 03/02/2022 Medical Rec #:  [id number]          Height:       75.0 in Accession #:    [id number]         Weight:       206.0 lb Date of Birth:  November 20, 1945          BSA:          2.222 m Patient Age:    5 years           BP:           105/54 mmHg Patient Gender: M                  HR:           63 bpm. Exam Location:  Inpatient Procedure: 2D Echo, Cardiac Doppler and Color Doppler Indications:    Abnormal ECG  History:        Patient has no prior history of Echocardiogram examinations.                 Risk Factors:Hypertension. Hx DVT.  Sonographer:    Clayton Lefort RDCS (AE) Referring Phys: [id number] [facility] A THOMAS  Sonographer  Comments: Technically difficult study due to poor echo windows. IMPRESSIONS  1. Left ventricular ejection fraction, by estimation, is 60 to 65%. The left ventricle has normal function. The left ventricle has no regional wall motion abnormalities. There is mild left ventricular hypertrophy. Left ventricular diastolic parameters are indeterminate.  2. Right ventricular systolic function is normal. The right ventricular size is normal.  3. Left atrial size was moderately dilated.  4. Right atrial size was moderately dilated.  5. No evidence of mitral valve regurgitation.  6. Aortic valve regurgitation is not visualized.  7. Aneurysm of the ascending aorta, measuring 45 mm.  8. The inferior vena cava is normal  in size with <50% respiratory variability, suggesting right atrial pressure of 8 mmHg. Comparison(s): No prior Echocardiogram. FINDINGS  Left Ventricle: Left ventricular ejection fraction, by estimation, is 60 to 65%. The left ventricle has normal function. The left ventricle has no regional wall motion abnormalities. The left ventricular internal cavity size was normal in size. There is  mild left ventricular hypertrophy. Left ventricular diastolic parameters are indeterminate. Right Ventricle: The right ventricular size is normal. Right ventricular systolic function is normal. Left Atrium: Left atrial size was moderately dilated. Right Atrium: Right atrial size was moderately dilated. Pericardium: There is no evidence of pericardial effusion. Mitral Valve: No evidence of mitral valve regurgitation. Tricuspid Valve: Tricuspid valve regurgitation is not demonstrated. Aortic Valve: Aortic valve regurgitation is not visualized. Aortic valve mean gradient measures 4.0 mmHg. Aortic valve peak gradient measures 7.0 mmHg. Aortic valve area, by VTI measures 3.20 cm. Pulmonic Valve: Pulmonic valve regurgitation is not visualized. Aorta: There is an aneurysm involving the ascending aorta measuring 45 mm. Venous: The  inferior vena cava is normal in size with less than 50% respiratory variability, suggesting right atrial pressure of 8 mmHg. IAS/Shunts: No atrial level shunt detected by color flow Doppler.  LEFT VENTRICLE PLAX 2D LVIDd:         4.40 cm   Diastology LVIDs:         3.00 cm   LV e' medial:    10.60 cm/s LV PW:         0.90 cm   LV E/e' medial:  6.5 LV IVS:        1.20 cm   LV e' lateral:   11.20 cm/s LVOT diam:     2.50 cm   LV E/e' lateral: 6.1 LV SV:         98 LV SV Index:   44 LVOT Area:     4.91 cm  RIGHT VENTRICLE             IVC RV Basal diam:  2.80 cm     IVC diam: 2.20 cm RV S prime:     17.40 cm/s TAPSE (M-mode): 3.8 cm LEFT ATRIUM           Index        RIGHT ATRIUM           Index LA diam:      3.40 cm 1.53 cm/m   RA Area:     16.20 cm LA Vol (A2C): 36.5 ml 16.43 ml/m  RA Volume:   37.90 ml  17.06 ml/m LA Vol (A4C): 90.1 ml 40.55 ml/m  AORTIC VALVE AV Area (Vmax):    3.50 cm AV Area (Vmean):   3.61 cm AV Area (VTI):     3.20 cm AV Vmax:           132.00 cm/s AV Vmean:          94.300 cm/s AV VTI:            0.305 m AV Peak Grad:      7.0 mmHg AV Mean Grad:      4.0 mmHg LVOT Vmax:         94.20 cm/s LVOT Vmean:        69.300 cm/s LVOT VTI:          0.199 m LVOT/AV VTI ratio: 0.65  AORTA Ao Root diam: 3.20 cm Ao Asc diam:  4.50 cm MITRAL VALVE MV Area (PHT): 2.90 cm    SHUNTS MV Decel  Time: 262 msec    Systemic VTI:  0.20 m MV E velocity: 68.60 cm/s  Systemic Diam: 2.50 cm MV A velocity: 74.60 cm/s MV E/A ratio:  0.92 Landscape architect signed by Phineas Inches Signature Date/Time: 03/02/2022/4:59:01 PM    Final    CT Angio Chest Pulmonary Embolism (PE) W or WO Contrast  Result Date: 03/02/2022 CLINICAL DATA:  Weakness and shortness of breath EXAM: CT ANGIOGRAPHY CHEST WITH CONTRAST TECHNIQUE: Multidetector CT imaging of the chest was performed using the standard protocol during bolus administration of intravenous contrast. Multiplanar CT image reconstructions and MIPs were obtained to  evaluate the vascular anatomy. RADIATION DOSE REDUCTION: This exam was performed according to the departmental dose-optimization program which includes automated exposure control, adjustment of the mA and/or kV according to patient size and/or use of iterative reconstruction technique. CONTRAST:  59m OMNIPAQUE IOHEXOL 350 MG/ML SOLN COMPARISON:  Chest x-ray from the previous day. FINDINGS: Cardiovascular: Right chest wall port is noted. Dilatation of the ascending aorta to 4.5 cm is noted. No dissection is noted. The pulmonary artery shows a normal branching pattern bilaterally. No filling defect to suggest pulmonary embolism is noted. Heart is at the upper limits of normal in size. No coronary calcifications are seen. Mediastinum/Nodes: Esophagus is well visualized and within normal limits. Lymph nodes are noted in lower neck and supraclavicular region particularly on the left the largest of these is seen on image number 65 of series 7 measuring 13 mm in short axis. Scattered mediastinal nodes are noted. The largest of these lies adjacent to the main pulmonary artery on the left measuring 18 mm in short axis. Subcarinal and paraesophageal lymph nodes are noted. The peer esophageal lymph node measures at least 2 cm in short axis. These are best visualized on image number 225 of series 7. Lungs/Pleura: Mild left basilar atelectasis is noted. No sizable effusion is noted. Mild nodularity is noted in the left upper lobe best seen on image number 73 of series 6. No other sizable nodule is noted in the left lung. Calcified granuloma is noted in the right upper lobe stable from the prior exam. Large right-sided pleural effusion is seen. Right lower lobe nodule is again identified best seen on image number 57 of series 6 stable in appearance from the prior exam. No new nodules are seen. Upper Abdomen: Visualized upper abdomen shows a large right renal cyst measuring up to 6.3 cm. This is stable from prior exam from  September of 2022 and benign in appearance. No follow-up is recommended. Musculoskeletal: Bony structures demonstrate diffuse sclerotic foci consistent with metastatic prostate carcinoma. Stable T3 compression deformity is noted. Review of the MIP images confirms the above findings. IMPRESSION: Changes consistent with metastatic prostate carcinoma with multiple sclerotic lesions and a chronic T3 compression deformity. No evidence of pulmonary emboli. Dilatation of the ascending aorta to 4.5 cm. Ascending thoracic aortic aneurysm. Recommend semi-annual imaging followup by CTA or MRA and referral to cardiothoracic surgery if not already obtained. This recommendation follows 2010 ACCF/AHA/AATS/ACR/ASA/SCA/SCAI/SIR/STS/SVM Guidelines for the Diagnosis and Management of Patients With Thoracic Aortic Disease. Circulation. 2010; 121:: G315-V761 Aortic aneurysm NOS (ICD10-I71.9) Large right-sided pleural effusion. Stable right lower lobe nodule. Mediastinal and left supraclavicular adenopathy. This has progressed in the interval from the prior exam but more similar to that seen on prior PET-CT from 10/27/2020. Need for further evaluation by means of PET-CT can be determined on a clinical basis. Electronically Signed   By: MLinus MakoD.  On: 03/02/2022 02:48   MR LUMBAR SPINE WO CONTRAST  Result Date: 03/01/2022 CLINICAL DATA:  Weakness, inability to walk EXAM: MRI LUMBAR SPINE WITHOUT CONTRAST TECHNIQUE: Multiplanar, multisequence MR imaging of the lumbar spine was performed. No intravenous contrast was administered. COMPARISON:  No prior MRI lumbar spine available, correlation is made with PET-CT 10/27/2020 and CT abdomen pelvis 11/10/2019 FINDINGS: Evaluation is somewhat limited by motion artifact. The patient declined to repeat motion limited sequences. Segmentation: 5 lumbar type vertebral bodies. Alignment:  Mild dextrocurvature.  No significant listhesis. Vertebrae: Diffusely heterogeneous and increased marrow  signal, consistent with the patient's history of prostate cancer. No acute fracture. Conus medullaris and cauda equina: Conus extends to the L2 level. Conus and cauda equina appear normal. Paraspinal and other soft tissues: Large right renal cyst, for which no follow-up is currently indicated. No lymphadenopathy in the field of view. Disc levels: T12-L1: No significant disc bulge. Mild facet arthropathy. No spinal canal stenosis or neural foraminal narrowing. L1-L2: Minimal disc bulge. Mild facet arthropathy. No spinal canal stenosis or neural foraminal narrowing. L2-L3: Minimal disc bulge. Mild facet arthropathy. No spinal canal stenosis or neural foraminal narrowing. L3-L4: Minimal disc bulge. Mild facet arthropathy. No spinal canal stenosis or neural foraminal narrowing. L4-L5: Minimal disc bulge. Mild-to-moderate facet arthropathy. No spinal canal stenosis or neural foraminal narrowing. L5-S1: Minimal disc bulge with small central protrusion. No spinal canal stenosis or neural foraminal narrowing. IMPRESSION: 1. No spinal canal stenosis or neural foraminal narrowing. 2. Diffusely heterogeneous and decreased marrow signal, consistent with the patient's history of prostate cancer. Electronically Signed   By: Merilyn Baba M.D.   On: 03/01/2022 19:23   MR BRAIN WO CONTRAST  Addendum Date: 03/01/2022   ADDENDUM REPORT: 03/01/2022 19:17 ADDENDUM: Correction- Skull and upper cervical spine: Prior right parietal craniotomy. Heterogeneous calvarial marrow signal, consistent patient's history of prostate cancer. Electronically Signed   By: Merilyn Baba M.D.   On: 03/01/2022 19:17   Result Date: 03/01/2022 CLINICAL DATA:  Immobility, weakness, unable to walk; stroke suspected EXAM: MRI HEAD WITHOUT CONTRAST TECHNIQUE: Multiplanar, multiecho pulse sequences of the brain and surrounding structures were obtained without intravenous contrast. COMPARISON:  No prior MRI head, correlation is made with CT head 03/01/2022  FINDINGS: Evaluation is somewhat limited by motion artifact. Brain: No restricted diffusion to suggest acute or subacute infarct.No acute hemorrhage, mass, mass effect, or midline shift. No hydrocephalus or extra-axial collection.No hemosiderin deposition to suggest remote hemorrhage.Normal pituitary. Low lying cerebellar tonsils, which extend 1-2 mm below the foramen magnum and do not meet criteria for Chiari I malformation.Mild age related cerebral atrophy. Vascular: Patent arterial flow voids. Skull and upper cervical spine: Prior right parietal craniotomy. Otherwise normal marrow signal. Sinuses/Orbits: Clear paranasal sinuses.No acute finding in the orbits. Other: The mastoid air cells are well aerated. IMPRESSION: No acute intracranial process. No evidence of acute or subacute infarct. Electronically Signed: By: Merilyn Baba M.D. On: 03/01/2022 19:13        Scheduled Meds:  aspirin EC  81 mg Oral Daily   pantoprazole (PROTONIX) IV  40 mg Intravenous Q12H   senna-docusate  1 tablet Oral BID   Continuous Infusions:  heparin 1,400 Units/hr (03/03/22 1521)     LOS: 2 days    Time spent: 51 minutes spent on chart review, discussion with nursing staff, consultants, updating family and interview/physical exam; more than 50% of that time was spent in counseling and/or coordination of care.    Sinai Mahany J [place] DO [facility] Available via Epic secure chat 7am-7pm After these hours, please refer to coverage provider listed on [email or web address] 03/03/2022, 3:26 PM

## 2022-03-03 NOTE — Progress Notes (Signed)
ANTICOAGULATION CONSULT NOTE - Follow Up Consult  Pharmacy Consult for heparin Indication: DVT  Labs: Recent Labs    03/01/22 0928 03/01/22 1741 03/01/22 2221 03/01/22 2310 03/02/22 0405 03/02/22 0835 03/02/22 2251  HGB 10.8*  --  8.6* 8.3* 8.8* 8.3*  --   HCT 32.8*  --  27.2* 25.6*  --   --   --   PLT 126*  --  104*  --   --   --   --   HEPARINUNFRC  --   --   --   --   --   --  0.59  CREATININE 0.98  --  1.03  --   --   --   --   TROPONINIHS 7 7  --   --   --   --   --    Assessment/Plan:  78yo male therapeutic on heparin with initial dosing for DVT. Will continue infusion at current rate of 1400 units/hr and confirm stable with am labs.   Wynona Neat, PharmD, BCPS  03/03/2022,1:23 AM

## 2022-03-03 NOTE — Progress Notes (Signed)
Daily Progress Note   Patient Name: [name]       Date: 03/03/2022 DOB: [birth year]  Age: 77 y.o. MRN#: [id number] Attending Physician: [place] Hooper Petteway J, DO Primary Care Physician: [name], MD Admit Date: 03/01/2022 Length of Stay: 2 days  Reason for Consultation/Follow-up: Establishing goals of care  HPI/Patient Profile:  77 y.o. 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  He presented to the emergency room via EMS for progressive weakness of the lower extremities and unable to bear weight.  He was admitted on 03/01/2022 with progressive debility now with acute progression of lower extremity weakness, GI bleed/anemia blood loss, end-stage prostate cancer, and others.    PMT was consulted for [place] conversations.  Subjective:   Subjective: Chart Reviewed. Updates received. Patient Assessed. Created space and opportunity for patient  and family to explore thoughts and feelings regarding current medical situation.  Today's Discussion: Today I met with the patient and his wife at the bedside. Again discussed plan to d/c hom with hospice. They know [facility] is aware of the referral and are monitoring him for d/c to set-up intake visit. She discussed getting home PT/OT, but I reminded her of the hospice philosophy and forgoing curative treatments. I described hospice as a service for patients who have a life expectancy of 6 months or less. The goal of hospice is the preservation of dignity and quality at the end phases of life. Under hospice care, the focus changes from curative to symptom relief rather than curative treatment (labs, office visits, imaging, therapy, etc). She verbalized  understanding.  She seems torn about the patient being mentally with it. I described that his brain if there, but his body is irreversibly failing. She agrees. He had a thoracentesis today to help with breathing. Hopeful for d/c tomorrow.  Discussed with hospitalist. He feels husband understands, but wife having difficulty accepting hospice despite saying she wants hospice. At discharge [facility] will see them in the home and explain again in more detail. If she continues to want therapy, they can arrange [place] therapy and OP palliative (rather than hospice).   I provided emotional and general support through therapeutic listening, empathy, sharing of stories, and other techniques. I answered all questions and addressed all concerns to the best of my ability.   Review of Systems  Constitutional:  Positive for fatigue.  Respiratory:  Negative for cough and shortness of breath (improved).   Gastrointestinal:  Negative for abdominal pain, nausea and vomiting.  Neurological:  Positive for weakness.    Objective:   Vital Signs:  BP 115/64 (BP Location: Left Arm)   Pulse 72   Temp 98.5 F (36.9 C) (Oral)   Resp 20   Ht '6\' 3"'$  (1.905 m)   Wt 99.1 kg   SpO2 95%   BMI 27.31 kg/m   Physical Exam: Physical Exam Vitals and nursing note reviewed.  Constitutional:      General: He is not in acute distress.    Appearance: He is ill-appearing.  HENT:     Head: Normocephalic and atraumatic.  Cardiovascular:     Rate and Rhythm: Normal rate.  Pulmonary:     Effort: Pulmonary effort is normal. No respiratory distress.  Abdominal:  General: Abdomen is flat.  Skin:    General: Skin is warm and dry.  Neurological:     General: No focal deficit present.     Mental Status: He is alert.  Psychiatric:        Mood and Affect: Mood normal.        Behavior: Behavior normal.     Palliative Assessment/Data: 20-30%    Existing Vynca/ACP Documentation: None  Assessment & Plan:    Impression: Present on Admission:  Lower extremity weakness  Hypotension  Benign essential hypertension  Malignant neoplasm of prostate (HCC)  Debility  Normocytic anemia  77 year old male with chronic comorbidities and acute presentation as described above.  He came into the emergency department for lower extremity weakness.  Ongoing workup.  They have previously been recommended hospice and have contacted [facility] and should have had an evaluation but he is now in the emergency room.  They are hoping this can be rescheduled soon.  I contacted [employer] [facility] and they are aware of him and are moderating his inpatient status. Wife agrees to hospice, but seems to be struggling with him not getting rehab. If she decides to insist at home PT/OT, can d/c home on [facility] with outpatient palliative. Overall prognosis is very poor.  SUMMARY OF RECOMMENDATIONS   Remain DNR Discharge home with home hospice when possible If they request home PT/OT, can d/c home with [facility] and OP Palliative with eventual transition to hospice Goals are clear at this time PMT will back off for now Please call us for any significant clinical changes or new palliative needs  Symptom Management:  Per primary team PMT is available to assist as needed  Code Status: DNR  Prognosis: Unable to determine  Discharge Planning: Home with Hospice  Discussed with: Patient, patient's family, medical team, nursing team  Thank you for allowing [place] to participate in the care of KAMORI BARBIER PMT will continue to support holistically.  Billing based on MDM: High  Problems Addressed: One acute or chronic illness or injury that poses a threat to life or bodily function  Amount and/or Complexity of Data: Category 3:Discussion of management or test interpretation with external physician/other qualified health care professional/appropriate source (not separately reported)  Risks: Decision not to  resuscitate or to de-escalate care because of poor prognosis (confirmed DNR)  Walden Field, NP Palliative Medicine Team  Team Phone # [phone] (Nights/Weekends)  10/25/2020, 8:17 AM

## 2022-03-04 DIAGNOSIS — I824Y3 Acute embolism and thrombosis of unspecified deep veins of proximal lower extremity, bilateral: Secondary | ICD-10-CM | POA: Diagnosis not present

## 2022-03-04 LAB — HEPARIN LEVEL (UNFRACTIONATED)
Heparin Unfractionated: 0.76 IU/mL — ABNORMAL HIGH (ref 0.30–0.70)
Heparin Unfractionated: 0.59 IU/mL (ref 0.30–0.70)

## 2022-03-04 LAB — CBC
HCT: 26.5 % — ABNORMAL LOW (ref 39.0–52.0)
Hemoglobin: 8.5 g/dL — ABNORMAL LOW (ref 13.0–17.0)
MCH: 30.7 pg (ref 26.0–34.0)
MCHC: 32.1 g/dL (ref 30.0–36.0)
MCV: 95.7 fL (ref 80.0–100.0)
Platelets: 103 10*3/uL — ABNORMAL LOW (ref 150–400)
RBC: 2.77 MIL/uL — ABNORMAL LOW (ref 4.22–5.81)
RDW: 16.2 % — ABNORMAL HIGH (ref 11.5–15.5)
WBC: 4.7 10*3/uL (ref 4.0–10.5)
nRBC: 0 % (ref 0.0–0.2)

## 2022-03-04 MED ORDER — APIXABAN 5 MG PO TABS: 5.0000 mg | ORAL_TABLET | Freq: Two times a day (BID) | ORAL | Status: DC

## 2022-03-04 MED ORDER — APIXABAN 5 MG PO TABS
10.0000 mg | ORAL_TABLET | Freq: Two times a day (BID) | ORAL | Status: DC
  Administered 2022-03-04: 10 mg via ORAL
  Filled 2022-03-04: qty 2

## 2022-03-04 MED ORDER — FUROSEMIDE 20 MG PO TABS: 20.0000 mg | ORAL_TABLET | Freq: Every day | ORAL | 2 refills | Status: DC

## 2022-03-04 MED ORDER — APIXABAN (ELIQUIS) VTE STARTER PACK (10MG AND 5MG): ORAL_TABLET | ORAL | 0 refills | Status: DC

## 2022-03-04 NOTE — Progress Notes (Signed)
ANTICOAGULATION CONSULT NOTE - Initial Consult  Pharmacy Consult for heparin>>Eliquis Indication: DVT  Allergies  Allergen Reactions   Penicillins Rash and Other (See Comments)    Has patient had a PCN reaction causing immediate rash, facial/tongue/throat swelling, SOB or lightheadedness with hypotension: YES Has patient had a PCN reaction causing severe rash involving mucus membranes or skin necrosis: YES Has patient had a PCN reaction that required hospitalization: NO Has patient had a PCN reaction occurring within the last 10 years: NO     Patient Measurements: Height: '6\' 3"'$  (190.5 cm) Weight: 101.7 kg (224 lb 3.3 oz) IBW/kg (Calculated) : 84.5 Heparin Dosing Weight: 93.4kg   Vital Signs: Temp: 98.4 F (36.9 C) (01/07 0753) Temp Source: Oral (01/07 0753) BP: 102/59 (01/07 0753) Pulse Rate: 66 (01/07 0753)  Labs: Recent Labs    03/01/22 0928 03/01/22 1741 03/01/22 2221 03/01/22 2310 03/02/22 0405 03/02/22 2251 03/03/22 0149 03/04/22 0106  HGB 10.8*  --  8.6* 8.3*   < >  --  8.2* 8.5*  HCT 32.8*  --  27.2* 25.6*  --   --  24.6* 26.5*  PLT 126*  --  104*  --   --   --  108* 103*  HEPARINUNFRC  --   --   --   --   --  0.59 0.55 0.76*  CREATININE 0.98  --  1.03  --   --   --  1.02  --   TROPONINIHS 7 7  --   --   --   --   --   --    < > = values in this interval not displayed.     Estimated Creatinine Clearance: 82.1 mL/min (by C-G formula based on SCr of 1.02 mg/dL).   Medical History: Past Medical History:  Diagnosis Date   Benign essential hypertension 03/04/2017   Hx of radiation therapy    Prostate cancer [facility])    Assessment: Patient admitted with CC of weakness and inability to put weight on legs. Found to have DVT. Not on anticoagulation PTA. Has been on heparin since 1/4 and pharmacy now consulted to transition to Eliquis.   Hgb 8.5, plt 103--low,stable and pharmacy will continue to monitor. No line issues or signs/symptoms of bleeding  reported.  Goal of Therapy:  Heparin level 0.3-0.7 units/ml Monitor platelets by anticoagulation protocol: Yes   Plan:  Stop heparin gtt 1/7 @ 1000 Give apixaban '10mg'$  BID starting 1/7 @ 1000 Monitor CBC, s/sx of bleeding    Billey Gosling, PharmD PGY1 Pharmacy Resident 1/7/20248:54 AM

## 2022-03-04 NOTE — TOC Transition Note (Signed)
Transition of Care [facility]) - CM/SW Discharge Note   Patient Details  Name: [name] MRN: [id number] Date of Birth: 1945/08/23  [facility] CM/SW Contact:  Bartholomew Crews, RN Phone Number: [phone] 03/04/2022, 11:06 AM   Clinical Narrative:     Spoke with patient and spouse at the bedside to discuss post acute transition. At this time, they would like [facility] with palliative care. Discussed DME needs. Offered choice of providers for [facility] and DME. Referral to [facility] for PT, OT, SW. Referral to [facility] for hospital bed, wheelchair, and BSC. Referral to [facility] for palliative care. Arrangements with [facility] for transportation home. No further TOC needs identified at this time.   Final next level of care: Home w Home Health Services Barriers to Discharge: No Barriers Identified   Patient Goals and CMS Choice CMS Medicare.gov Compare Post Acute Care list provided to:: Patient Choice offered to / list presented to : Patient, Spouse  Discharge Placement                         Discharge Plan and Services Additional resources added to the After Visit Summary for                  DME Arranged: 3-N-1, Wheelchair manual, Hospital bed DME Agency: [facility] Date DME Agency Contacted: 03/04/22 Time DME Agency Contacted: 57 Representative spoke with at DME Agency: [place]: PT, OT, Social Work [employer] Agency: [place] Date [place]: 03/04/22 Time [place]: 48 Representative spoke with at [place]: [place] Interventions SDOH Screenings   Tobacco Use: Low Risk  (03/01/2022)     Readmission Risk Interventions     No data to display

## 2022-03-04 NOTE — Discharge Summary (Signed)
Physician Discharge Summary  [name] BZJ:[id number] DOB: 02-03-1946 DOA: 03/01/2022  PCP: [name], MD  Admit date: 03/01/2022 Discharge date: 03/04/2022  Admitted From: Home Disposition: Home with outpatient palliative care to follow  Recommendations for Outpatient Follow-up:  Follow up with PCP in 1-2 weeks Started on Eliquis for DVT Discontinue propranolol and amlodipine for borderline hypotension Reduced dose of furosemide to 20 mg p.o. daily Please obtain BMP/CBC in one week Discharging with outpatient palliative care, anticipate need of home hospice in the near future.  Continue goals of care discussion outpatient.  Home Health: PT/OT/social work Equipment/Devices: Radio broadcast assistant, hospital bed, 3 n 1 bedside commode  Discharge Condition: Stable CODE STATUS: DNR Diet recommendation: Regular diet  History of present illness:  [name] is a 77 y.o. 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 ED with progressive decline over the last few weeks.  Patient now with worsening lower extremity edema, unable to bear weight on his legs, right lower extremity worse than left.  Due to these persistent symptoms and inability to take care of him at home patient was brought to the ED by EMS.   In the ED, temperature 98.4 F, HR 59, RR 19, BP 102/63, SpO2 98% on room air.  WBC 4.6, hemoglobin 10.8, platelets 126.  Sodium 135, potassium 3.7, chloride 97, CO2 29, glucose 111, BUN 22, creatinine 0.98.  AST 18, ALT 12, total bilirubin 0.8.  High sensitive troponin 7> 7.  ESR 16.  CRP 0.6.  Procalcitonin less than 0.10.  Covid-19 PCR negative.  Influenza A/B PCR negative.  RSV PCR negative.  D-dimer elevated at 10.  CT head without contrast with no acute intracranial findings.   MR brain without contrast with no acute intracranial process.  MR lumbar spine without contrast with no spinal canal stenosis or neural foraminal narrowing.  CT angiogram chest ordered.  TRH consulted for admission for further evaluation management of progressive weakness/decline.  Hospital course:  DVT, bilateral lower extremities Patient presenting to ED with progressive lower extremity edema associated with weakness and inability to ambulate and stand.  D-dimer elevated at 10 in the ED.  CT angiogram chest with large right-sided pleural effusion, stable right lower lobe nodule, mediastinal and left supraclavicular adenopathy which has progressed from prior exam, dilation ascending aorta 4.5 cm, no evidence of PE, changes consistent with metastatic prostate cancer with multiple sclerotic lesions in the chronic T3 compression deformity.  Vascular duplex ultrasound bilateral lower extremities positive for DVT right gastrocnemius and left femoral vein.  Patient was started on heparin drip and transition to Eliquis.  Outpatient follow-up with PCP for further refills.   Hypotension Hx essential benign HTN Patient with borderline hypotension, discontinued home propranolol and amlodipine.  Reduce dose of furosemide to 20 g p.o. daily.  Outpatient follow-up with PCP.   Right pleural effusion Underwent IR thoracentesis on 03/03/2022 with 1.2L pleural fluid removed.  Reduced dose of home furosemide to 20 mg p.o. daily.   Advanced prostate cancer with metastasis to bone Patient with previous prostatectomy, lymph node dissection and radiation therapy.  Follows with medical oncology outpatient, Dr. Alen Blew.  Per oncology, now with end-stage status and recommending transition to hospice. Palliative care was consulted and followed during hospital course.  Patient and spouse prefer home health on discharge with outpatient palliative care to follow.  If does not improve, anticipate need of transitioning to hospice in  the near future.  Continue goals of care discussions outpatient.  Outpatient follow-up with medical oncology, Dr. Alen Blew.   Weakness/debility/gait disturbance/deconditioning: Discharging with home health PT/OT.  Discharge Diagnoses:  Principal Problem:   Lower extremity weakness Active Problems:   Malignant neoplasm of prostate (HCC)   Benign essential hypertension   History of deep vein thrombosis   Hypotension   Debility   Normocytic anemia    Discharge Instructions  Discharge Instructions     Call MD for:  difficulty breathing, headache or visual disturbances   Complete by: As directed    Call MD for:  extreme fatigue   Complete by: As directed    Call MD for:  persistant dizziness or light-headedness   Complete by: As directed    Call MD for:  persistant nausea and vomiting   Complete by: As directed    Call MD for:  severe uncontrolled pain   Complete by: As directed    Call MD for:  temperature >100.4   Complete by: As directed    Diet - low sodium heart healthy   Complete by: As directed    Increase activity slowly   Complete by: As directed       Allergies as of 03/04/2022       Reactions   Penicillins Rash, Other (See Comments)   Has patient had a PCN reaction causing immediate rash, facial/tongue/throat swelling, SOB or lightheadedness with hypotension: YES Has patient had a PCN reaction causing severe rash involving mucus membranes or skin necrosis: YES Has patient had a PCN reaction that required hospitalization: NO Has patient had a PCN reaction occurring within the last 10 years: NO        Medication List     STOP taking these medications    ALPRAZolam 0.5 MG tablet Commonly known as: XANAX   amLODipine 5 MG tablet Commonly known as: NORVASC   NON FORMULARY   propranolol ER 60 MG 24 hr capsule Commonly known as: INDERAL LA   Xtandi 40 MG tablet Generic drug: enzalutamide       TAKE these medications    Apixaban Starter Pack  ('10mg'$  and '5mg'$ ) Commonly known as: ELIQUIS STARTER PACK Take as directed on package: start with two-'5mg'$  tablets twice daily for 7 days. On day 8, switch to one-'5mg'$  tablet twice daily.   aspirin EC 81 MG tablet Take 81 mg by mouth daily.   b complex vitamins capsule Take 1 capsule by mouth daily.   calcium-vitamin D 500-200 MG-UNIT tablet Commonly known as: Oscal 500/200 D-3 Take 1 tablet by mouth daily with breakfast.   ferrous sulfate 325 (65 FE) MG EC tablet Take 325 mg by mouth daily.   Fish Oil 1000 MG Caps Take 1 capsule by mouth daily.   furosemide 20 MG tablet Commonly known as: Lasix Take 1 tablet (20 mg total) by mouth daily. What changed:  medication strength how much to take when to take this   lidocaine-prilocaine cream Commonly known as: EMLA Apply 1 application topically as needed.   LUPRON DEPOT IM Inject into the muscle every 4 (four) months.   magnesium oxide 400 (240 Mg) MG tablet Commonly known as: MAG-OX Take 400 mg by mouth daily.   MULTIVITAMIN ADULT PO multivitamin  1 tablet orally once a day   Vitamin C 100 MG Chew Vitamin C  1 once a day   Vitamin D3 75 MCG (3000 UT) Tabs  cholecalciferol (vitamin D3) 1,000 unit tablet  1 tablet orally once a day               Durable Medical Equipment  (From admission, onward)           Start     Ordered   03/03/22 1741  For home use only DME lightweight manual wheelchair with seat cushion  Once       Comments: Patient suffers from cancer which impairs their ability to perform daily activities like bathing in the home.  A walker will not resolve  issue with performing activities of daily living. A wheelchair will allow patient to safely perform daily activities. Patient is not able to propel themselves in the home using a standard weight wheelchair due to general weakness. Patient can self propel in the lightweight wheelchair. Length of need Lifetime. Accessories: elevating leg rests  (ELRs), wheel locks, extensions and anti-tippers.   03/03/22 1741   03/03/22 1740  For home use only DME Hospital bed  Once       Question Answer Comment  Length of Need Lifetime   Patient has (list medical condition): end stage prostate cancer   The above medical condition requires: Patient requires the ability to reposition frequently   Bed type Semi-electric   Hoyer Lift Yes   Trapeze Bar Yes   Support Surface: Low Air loss Mattress      03/03/22 1741   03/03/22 1738  For home use only DME 3 n 1  Once        03/03/22 1741            Follow-up Information     [name], MD. Schedule an appointment as soon as possible for a visit in 1 week(s).   Specialty: Internal Medicine Contact information: [address] [place] [address] [phone]                Allergies  Allergen Reactions   Penicillins Rash and Other (See Comments)    Has patient had a PCN reaction causing immediate rash, facial/tongue/throat swelling, SOB or lightheadedness with hypotension: YES Has patient had a PCN reaction causing severe rash involving mucus membranes or skin necrosis: YES Has patient had a PCN reaction that required hospitalization: NO Has patient had a PCN reaction occurring within the last 10 years: NO     Consultations: Palliative care   Procedures/Studies: VAS [place] LOWER EXTREMITY VENOUS (DVT)  Result Date: 03/03/2022  Lower Venous DVT Study Patient Name:  [name] Bines  Date of Exam:   03/02/2022 Medical Rec #: [id number]           Accession #:    [id number] Date of Birth: Oct 08, 1945           Patient Gender: M Patient Age:   43 years Exam Location:  [facility] Procedure:      VAS [place] LOWER EXTREMITY VENOUS (DVT) Referring Phys: RIPUDEEP RAI --------------------------------------------------------------------------------  Indications: Elevated d-dimer >10, bilateral leg weakness. Other Indications: Patient endorses remote history of PE. Risk  Factors: Cancer - prostate. Comparison Study: No prior studies. Performing Technologist: Darlin Coco RDMS, RVT  Examination Guidelines: A complete evaluation includes B-mode imaging, spectral Doppler, color Doppler, and power Doppler as needed of all accessible portions of each vessel. Bilateral testing is considered an integral part of a complete examination. Limited examinations for reoccurring indications may be performed as noted. The reflux portion of the exam is performed with the patient in reverse  Trendelenburg.  +---------+---------------+---------+-----------+----------+--------------+ RIGHT    CompressibilityPhasicitySpontaneityPropertiesThrombus Aging +---------+---------------+---------+-----------+----------+--------------+ CFV      Full           Yes      Yes                                 +---------+---------------+---------+-----------+----------+--------------+ SFJ      Full                                                        +---------+---------------+---------+-----------+----------+--------------+ FV Prox  Full                                                        +---------+---------------+---------+-----------+----------+--------------+ FV Mid   Full                                                        +---------+---------------+---------+-----------+----------+--------------+ FV DistalFull                                                        +---------+---------------+---------+-----------+----------+--------------+ PFV      Full                                                        +---------+---------------+---------+-----------+----------+--------------+ POP      Full           Yes      Yes                                 +---------+---------------+---------+-----------+----------+--------------+ PTV      Full                                                         +---------+---------------+---------+-----------+----------+--------------+ PERO     Full                                                        +---------+---------------+---------+-----------+----------+--------------+ Gastroc  None           No       No                   Acute          +---------+---------------+---------+-----------+----------+--------------+   +---------+---------------+---------+-----------+---------------+-------------+  LEFT     CompressibilityPhasicitySpontaneityProperties     Thrombus                                                                 Aging         +---------+---------------+---------+-----------+---------------+-------------+ CFV      Full           Yes      Yes                                     +---------+---------------+---------+-----------+---------------+-------------+ SFJ      Full                                                            +---------+---------------+---------+-----------+---------------+-------------+ FV Prox  Partial        Yes      Yes        brightly       Chronic                                                   echogenic                    +---------+---------------+---------+-----------+---------------+-------------+ FV Mid   None           No       No         brightly       Chronic                                                   echogenic                    +---------+---------------+---------+-----------+---------------+-------------+ FV DistalFull           Yes      Yes                                     +---------+---------------+---------+-----------+---------------+-------------+ PFV      Full                                                            +---------+---------------+---------+-----------+---------------+-------------+ POP      Full           Yes      Yes                                      +---------+---------------+---------+-----------+---------------+-------------+  PTV      Full                                                            +---------+---------------+---------+-----------+---------------+-------------+ PERO     Full                                                            +---------+---------------+---------+-----------+---------------+-------------+ Gastroc  Full                                                            +---------+---------------+---------+-----------+---------------+-------------+     Summary: RIGHT: - Findings consistent with acute deep vein thrombosis involving the right gastrocnemius veins. - No cystic structure found in the popliteal fossa.  LEFT: - Findings consistent with acute deep vein thrombosis involving the left femoral vein. - No cystic structure found in the popliteal fossa.  *See table(s) above for measurements and observations. Electronically signed by Orlie Pollen on 03/03/2022 at 1:15:37 PM.    Final    US THORACENTESIS ASP PLEURAL SPACE W/IMG GUIDE  Result Date: 03/03/2022 INDICATION: Metastatic disease, recurrent pleural effusion EXAM: ULTRASOUND GUIDED RIGHT THORACENTESIS MEDICATIONS: None. COMPLICATIONS: None immediate. PROCEDURE: An ultrasound guided thoracentesis was thoroughly discussed with the patient and questions answered. The benefits, risks, alternatives and complications were also discussed. The patient understands and wishes to proceed with the procedure. Written consent was obtained. Ultrasound was performed to localize and mark an adequate pocket of fluid in the RIGHT chest. The area was then prepped and draped in the normal sterile fashion. 1% Lidocaine was used for local anesthesia. Under ultrasound guidance a 6 Fr Safe-T-Centesis catheter was introduced. Thoracentesis was performed. The catheter was removed and a dressing applied. FINDINGS: A total of approximately 1.2 L of pleural fluid was removed.  Performed and read by Pasty Spillers, PA-C IMPRESSION: Successful ultrasound guided therapeutic RIGHT thoracentesis yielding 1.2L of pleural fluid. Michaelle Birks, MD [facility] Electronically Signed   By: Michaelle Birks M.D.   On: 03/03/2022 12:51   DG Chest 1 View  Result Date: 03/03/2022 CLINICAL DATA:  Thoracentesis. EXAM: CHEST  1 VIEW COMPARISON:  March 01, 2022 chest x-ray FINDINGS: Known bony metastatic disease. The cardiomediastinal silhouette is stable. A right Port-A-Cath is stable. The right pleural effusion is no longer visualized. No pneumothorax after thoracentesis. Mild interstitial markings in the lungs are similar suggesting edema. No other interval changes or acute abnormalities. IMPRESSION: 1. No pneumothorax after thoracentesis. The right pleural effusion is no longer visualized. 2. Mild pulmonary venous congestion/edema. 3. Known bony metastatic disease. Electronically Signed   By: Dorise Bullion III M.D.   On: 03/03/2022 11:42   ECHOCARDIOGRAM COMPLETE  Result Date: 03/02/2022    ECHOCARDIOGRAM REPORT   Patient Name:   [name] Date of Exam: 03/02/2022 Medical Rec #:  [id number]          Height:  75.0 in Accession #:    [id number]         Weight:       206.0 lb Date of Birth:  15-Dec-1945          BSA:          2.222 m Patient Age:    33 years           BP:           105/54 mmHg Patient Gender: M                  HR:           63 bpm. Exam Location:  Inpatient Procedure: 2D Echo, Cardiac Doppler and Color Doppler Indications:    Abnormal ECG  History:        Patient has no prior history of Echocardiogram examinations.                 Risk Factors:Hypertension. Hx DVT.  Sonographer:    Clayton Lefort RDCS (AE) Referring Phys: [id number] [facility] A THOMAS  Sonographer Comments: Technically difficult study due to poor echo windows. IMPRESSIONS  1. Left ventricular ejection fraction, by estimation, is 60 to 65%. The left  ventricle has normal function. The left ventricle has no regional wall motion abnormalities. There is mild left ventricular hypertrophy. Left ventricular diastolic parameters are indeterminate.  2. Right ventricular systolic function is normal. The right ventricular size is normal.  3. Left atrial size was moderately dilated.  4. Right atrial size was moderately dilated.  5. No evidence of mitral valve regurgitation.  6. Aortic valve regurgitation is not visualized.  7. Aneurysm of the ascending aorta, measuring 45 mm.  8. The inferior vena cava is normal in size with <50% respiratory variability, suggesting right atrial pressure of 8 mmHg. Comparison(s): No prior Echocardiogram. FINDINGS  Left Ventricle: Left ventricular ejection fraction, by estimation, is 60 to 65%. The left ventricle has normal function. The left ventricle has no regional wall motion abnormalities. The left ventricular internal cavity size was normal in size. There is  mild left ventricular hypertrophy. Left ventricular diastolic parameters are indeterminate. Right Ventricle: The right ventricular size is normal. Right ventricular systolic function is normal. Left Atrium: Left atrial size was moderately dilated. Right Atrium: Right atrial size was moderately dilated. Pericardium: There is no evidence of pericardial effusion. Mitral Valve: No evidence of mitral valve regurgitation. Tricuspid Valve: Tricuspid valve regurgitation is not demonstrated. Aortic Valve: Aortic valve regurgitation is not visualized. Aortic valve mean gradient measures 4.0 mmHg. Aortic valve peak gradient measures 7.0 mmHg. Aortic valve area, by VTI measures 3.20 cm. Pulmonic Valve: Pulmonic valve regurgitation is not visualized. Aorta: There is an aneurysm involving the ascending aorta measuring 45 mm. Venous: The inferior vena cava is normal in size with less than 50% respiratory variability, suggesting right atrial pressure of 8 mmHg. IAS/Shunts: No atrial level shunt  detected by color flow Doppler.  LEFT VENTRICLE PLAX 2D LVIDd:         4.40 cm   Diastology LVIDs:         3.00 cm   LV e' medial:    10.60 cm/s LV PW:         0.90 cm   LV E/e' medial:  6.5 LV IVS:        1.20 cm   LV e' lateral:   11.20 cm/s LVOT diam:     2.50 cm   LV E/e' lateral: 6.1 LV  SV:         98 LV SV Index:   44 LVOT Area:     4.91 cm  RIGHT VENTRICLE             IVC RV Basal diam:  2.80 cm     IVC diam: 2.20 cm RV S prime:     17.40 cm/s TAPSE (M-mode): 3.8 cm LEFT ATRIUM           Index        RIGHT ATRIUM           Index LA diam:      3.40 cm 1.53 cm/m   RA Area:     16.20 cm LA Vol (A2C): 36.5 ml 16.43 ml/m  RA Volume:   37.90 ml  17.06 ml/m LA Vol (A4C): 90.1 ml 40.55 ml/m  AORTIC VALVE AV Area (Vmax):    3.50 cm AV Area (Vmean):   3.61 cm AV Area (VTI):     3.20 cm AV Vmax:           132.00 cm/s AV Vmean:          94.300 cm/s AV VTI:            0.305 m AV Peak Grad:      7.0 mmHg AV Mean Grad:      4.0 mmHg LVOT Vmax:         94.20 cm/s LVOT Vmean:        69.300 cm/s LVOT VTI:          0.199 m LVOT/AV VTI ratio: 0.65  AORTA Ao Root diam: 3.20 cm Ao Asc diam:  4.50 cm MITRAL VALVE MV Area (PHT): 2.90 cm    SHUNTS MV Decel Time: 262 msec    Systemic VTI:  0.20 m MV E velocity: 68.60 cm/s  Systemic Diam: 2.50 cm MV A velocity: 74.60 cm/s MV E/A ratio:  0.92 Landscape architect signed by Phineas Inches Signature Date/Time: 03/02/2022/4:59:01 PM    Final    CT Angio Chest Pulmonary Embolism (PE) W or WO Contrast  Result Date: 03/02/2022 CLINICAL DATA:  Weakness and shortness of breath EXAM: CT ANGIOGRAPHY CHEST WITH CONTRAST TECHNIQUE: Multidetector CT imaging of the chest was performed using the standard protocol during bolus administration of intravenous contrast. Multiplanar CT image reconstructions and MIPs were obtained to evaluate the vascular anatomy. RADIATION DOSE REDUCTION: This exam was performed according to the departmental dose-optimization program which includes automated  exposure control, adjustment of the mA and/or kV according to patient size and/or use of iterative reconstruction technique. CONTRAST:  33m OMNIPAQUE IOHEXOL 350 MG/ML SOLN COMPARISON:  Chest x-ray from the previous day. FINDINGS: Cardiovascular: Right chest wall port is noted. Dilatation of the ascending aorta to 4.5 cm is noted. No dissection is noted. The pulmonary artery shows a normal branching pattern bilaterally. No filling defect to suggest pulmonary embolism is noted. Heart is at the upper limits of normal in size. No coronary calcifications are seen. Mediastinum/Nodes: Esophagus is well visualized and within normal limits. Lymph nodes are noted in lower neck and supraclavicular region particularly on the left the largest of these is seen on image number 65 of series 7 measuring 13 mm in short axis. Scattered mediastinal nodes are noted. The largest of these lies adjacent to the main pulmonary artery on the left measuring 18 mm in short axis. Subcarinal and paraesophageal lymph nodes are noted. The peer esophageal lymph node measures at least 2 cm in  short axis. These are best visualized on image number 225 of series 7. Lungs/Pleura: Mild left basilar atelectasis is noted. No sizable effusion is noted. Mild nodularity is noted in the left upper lobe best seen on image number 73 of series 6. No other sizable nodule is noted in the left lung. Calcified granuloma is noted in the right upper lobe stable from the prior exam. Large right-sided pleural effusion is seen. Right lower lobe nodule is again identified best seen on image number 57 of series 6 stable in appearance from the prior exam. No new nodules are seen. Upper Abdomen: Visualized upper abdomen shows a large right renal cyst measuring up to 6.3 cm. This is stable from prior exam from September of 2022 and benign in appearance. No follow-up is recommended. Musculoskeletal: Bony structures demonstrate diffuse sclerotic foci consistent with metastatic  prostate carcinoma. Stable T3 compression deformity is noted. Review of the MIP images confirms the above findings. IMPRESSION: Changes consistent with metastatic prostate carcinoma with multiple sclerotic lesions and a chronic T3 compression deformity. No evidence of pulmonary emboli. Dilatation of the ascending aorta to 4.5 cm. Ascending thoracic aortic aneurysm. Recommend semi-annual imaging followup by CTA or MRA and referral to cardiothoracic surgery if not already obtained. This recommendation follows 2010 ACCF/AHA/AATS/ACR/ASA/SCA/SCAI/SIR/STS/SVM Guidelines for the Diagnosis and Management of Patients With Thoracic Aortic Disease. Circulation. 2010; 121: W620-B559. Aortic aneurysm NOS (ICD10-I71.9) Large right-sided pleural effusion. Stable right lower lobe nodule. Mediastinal and left supraclavicular adenopathy. This has progressed in the interval from the prior exam but more similar to that seen on prior PET-CT from 10/27/2020. Need for further evaluation by means of PET-CT can be determined on a clinical basis. Electronically Signed   By: Inez Catalina M.D.   On: 03/02/2022 02:48   MR LUMBAR SPINE WO CONTRAST  Result Date: 03/01/2022 CLINICAL DATA:  Weakness, inability to walk EXAM: MRI LUMBAR SPINE WITHOUT CONTRAST TECHNIQUE: Multiplanar, multisequence MR imaging of the lumbar spine was performed. No intravenous contrast was administered. COMPARISON:  No prior MRI lumbar spine available, correlation is made with PET-CT 10/27/2020 and CT abdomen pelvis 11/10/2019 FINDINGS: Evaluation is somewhat limited by motion artifact. The patient declined to repeat motion limited sequences. Segmentation: 5 lumbar type vertebral bodies. Alignment:  Mild dextrocurvature.  No significant listhesis. Vertebrae: Diffusely heterogeneous and increased marrow signal, consistent with the patient's history of prostate cancer. No acute fracture. Conus medullaris and cauda equina: Conus extends to the L2 level. Conus and cauda  equina appear normal. Paraspinal and other soft tissues: Large right renal cyst, for which no follow-up is currently indicated. No lymphadenopathy in the field of view. Disc levels: T12-L1: No significant disc bulge. Mild facet arthropathy. No spinal canal stenosis or neural foraminal narrowing. L1-L2: Minimal disc bulge. Mild facet arthropathy. No spinal canal stenosis or neural foraminal narrowing. L2-L3: Minimal disc bulge. Mild facet arthropathy. No spinal canal stenosis or neural foraminal narrowing. L3-L4: Minimal disc bulge. Mild facet arthropathy. No spinal canal stenosis or neural foraminal narrowing. L4-L5: Minimal disc bulge. Mild-to-moderate facet arthropathy. No spinal canal stenosis or neural foraminal narrowing. L5-S1: Minimal disc bulge with small central protrusion. No spinal canal stenosis or neural foraminal narrowing. IMPRESSION: 1. No spinal canal stenosis or neural foraminal narrowing. 2. Diffusely heterogeneous and decreased marrow signal, consistent with the patient's history of prostate cancer. Electronically Signed   By: Merilyn Baba M.D.   On: 03/01/2022 19:23   MR BRAIN WO CONTRAST  Addendum Date: 03/01/2022   ADDENDUM REPORT: 03/01/2022 19:17  ADDENDUM: Correction- Skull and upper cervical spine: Prior right parietal craniotomy. Heterogeneous calvarial marrow signal, consistent patient's history of prostate cancer. Electronically Signed   By: Merilyn Baba M.D.   On: 03/01/2022 19:17   Result Date: 03/01/2022 CLINICAL DATA:  Immobility, weakness, unable to walk; stroke suspected EXAM: MRI HEAD WITHOUT CONTRAST TECHNIQUE: Multiplanar, multiecho pulse sequences of the brain and surrounding structures were obtained without intravenous contrast. COMPARISON:  No prior MRI head, correlation is made with CT head 03/01/2022 FINDINGS: Evaluation is somewhat limited by motion artifact. Brain: No restricted diffusion to suggest acute or subacute infarct.No acute hemorrhage, mass, mass effect,  or midline shift. No hydrocephalus or extra-axial collection.No hemosiderin deposition to suggest remote hemorrhage.Normal pituitary. Low lying cerebellar tonsils, which extend 1-2 mm below the foramen magnum and do not meet criteria for Chiari I malformation.Mild age related cerebral atrophy. Vascular: Patent arterial flow voids. Skull and upper cervical spine: Prior right parietal craniotomy. Otherwise normal marrow signal. Sinuses/Orbits: Clear paranasal sinuses.No acute finding in the orbits. Other: The mastoid air cells are well aerated. IMPRESSION: No acute intracranial process. No evidence of acute or subacute infarct. Electronically Signed: By: Merilyn Baba M.D. On: 03/01/2022 19:13   CT Head Wo Contrast  Result Date: 03/01/2022 CLINICAL DATA:  Right-sided weakness EXAM: CT HEAD WITHOUT CONTRAST TECHNIQUE: Contiguous axial images were obtained from the base of the skull through the vertex without intravenous contrast. RADIATION DOSE REDUCTION: This exam was performed according to the departmental dose-optimization program which includes automated exposure control, adjustment of the mA and/or kV according to patient size and/or use of iterative reconstruction technique. COMPARISON:  Previous studies including the examination of 02/14/2016 FINDINGS: Brain: No acute intracranial findings are seen. Cortical sulci are prominent. There is no focal edema or mass effect. Vascular: Scattered arterial calcifications are seen. Skull: There is previous right frontoparietal craniotomy. Sinuses/Orbits: Unremarkable. Other: None. IMPRESSION: No acute intracranial findings are seen in noncontrast CT brain. Atrophy. Electronically Signed   By: Elmer Picker M.D.   On: 03/01/2022 10:06   DG Chest 2 View  Result Date: 03/01/2022 CLINICAL DATA:  Weakness. Loss of balance. History of prostate cancer. EXAM: CHEST - 2 VIEW COMPARISON:  AP chest 06/16/2021 and 05/17/2021; CT chest 05/16/2021 FINDINGS: Right chest wall  porta catheter is again seen with tip overlying the central superior vena cava. There appears to be resolution of the prior small left pleural effusion seen on 06/16/2021. Slight interval increase in small right pleural effusion and associated right basilar atelectasis compared to 06/16/2021. No pneumothorax. 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 Remote moderate to severe T3 compression fracture better seen on prior CT. IMPRESSION: 1. Slight interval increase in small right pleural effusion and associated right basilar atelectasis compared to 06/16/2021. 2. Interval resolution of the prior small left pleural effusion. Electronically Signed   By: Yvonne Kendall M.D.   On: 03/01/2022 09:55     Subjective: Patient seen examined bedside, resting comfortably.  Lying in bed.  Spouse present.  No specific complaints this morning.  Transition from heparin drip to Eliquis today.  Discussed with patient and spouse will discharge with home health and outpatient palliative care to follow but may need to consider hospice in the near future if no significant improvement in overall condition.  This was previously recommended by his primary medical oncologist, Dr. Alen Blew.  Patient denies headache, no visual changes, no chest pain, no palpitations, no shortness of breath, no fever/chills/night sweats, no nausea/vomiting/diarrhea, no focal weakness, no  fatigue, no paresthesias.  No acute events overnight per nursing staff.  Discharge Exam: Vitals:   03/04/22 0319 03/04/22 0753  BP: 103/61 (!) 102/59  Pulse: 60 66  Resp: 18 18  Temp: 97.9 F (36.6 C) 98.4 F (36.9 C)  SpO2: 92% 96%   Vitals:   03/03/22 2347 03/04/22 0319 03/04/22 0500 03/04/22 0753  BP:  103/61  (!) 102/59  Pulse:  60  66  Resp: '16 18  18  '$ Temp: 98.4 F (36.9 C) 97.9 F (36.6 C)  98.4 F (36.9 C)  TempSrc: Oral Oral  Oral  SpO2:  92%  96%  Weight:   101.7 kg   Height:        Physical Exam: GEN: NAD, alert and oriented x  3, chronically ill appearance, appears older than stated age HEENT: NCAT, PERRL, EOMI, sclera clear, MMM PULM: CTAB w/o wheezes/crackles, normal respiratory effort, on room air CV: RRR w/o M/G/R GI: abd soft, NTND, NABS, no R/G/M MSK: + lower extremity peripheral edema, moves all extremities independently NEURO: CN II-XII intact, no focal deficits, sensation to light touch intact PSYCH: normal mood/affect Integumentary: dry/intact, no rashes or wounds    The results of significant diagnostics from this hospitalization (including imaging, microbiology, ancillary and laboratory) are listed below for reference.     Microbiology: Recent Results (from the past 240 hour(s))  Resp panel by RT-PCR (RSV, Flu A&B, Covid) Anterior Nasal Swab     Status: None   Collection Time: 03/01/22  9:35 PM   Specimen: Anterior Nasal Swab  Result Value Ref Range Status   SARS Coronavirus 2 by RT PCR NEGATIVE NEGATIVE Final    Comment: (NOTE) SARS-CoV-2 target nucleic acids are NOT DETECTED.  The SARS-CoV-2 RNA is generally detectable in upper respiratory specimens during the acute phase of infection. The lowest concentration of SARS-CoV-2 viral copies this assay can detect is 138 copies/mL. A negative result does not preclude SARS-Cov-2 infection and should not be used as the sole basis for treatment or other patient management decisions. A negative result may occur with  improper specimen collection/handling, submission of specimen other than nasopharyngeal swab, presence of viral mutation(s) within the areas targeted by this assay, and inadequate number of viral copies(<138 copies/mL). A negative result must be combined with clinical observations, patient history, and epidemiological information. The expected result is Negative.  Fact Sheet for Patients:  EntrepreneurPulse.com.au  Fact Sheet for Healthcare Providers:  IncredibleEmployment.be  This test is no t  yet approved or cleared by the [place] FDA and  has been authorized for detection and/or diagnosis of SARS-CoV-2 by FDA under an Emergency Use Authorization (EUA). This EUA will remain  in effect (meaning this test can be used) for the duration of the COVID-19 declaration under Section 564(b)(1) of the Act, 21 U.S.C.section 360bbb-3(b)(1), unless the authorization is terminated  or revoked sooner.       Influenza A by PCR NEGATIVE NEGATIVE Final   Influenza B by PCR NEGATIVE NEGATIVE Final    Comment: (NOTE) The Xpert Xpress SARS-CoV-2/FLU/RSV plus assay is intended as an aid in the diagnosis of influenza from Nasopharyngeal swab specimens and should not be used as a sole basis for treatment. Nasal washings and aspirates are unacceptable for Xpert Xpress SARS-CoV-2/FLU/RSV testing.  Fact Sheet for Patients: EntrepreneurPulse.com.au  Fact Sheet for Healthcare Providers: IncredibleEmployment.be  This test is not yet approved or cleared by the [place] FDA and has been authorized for detection and/or diagnosis of SARS-CoV-2 by FDA  under an Emergency Use Authorization (EUA). This EUA will remain in effect (meaning this test can be used) for the duration of the COVID-19 declaration under Section 564(b)(1) of the Act, 21 U.S.C. section 360bbb-3(b)(1), unless the authorization is terminated or revoked.     Resp Syncytial Virus by PCR NEGATIVE NEGATIVE Final    Comment: (NOTE) Fact Sheet for Patients: EntrepreneurPulse.com.au  Fact Sheet for Healthcare Providers: IncredibleEmployment.be  This test is not yet approved or cleared by the [place] FDA and has been authorized for detection and/or diagnosis of SARS-CoV-2 by FDA under an Emergency Use Authorization (EUA). This EUA will remain in effect (meaning this test can be used) for the duration of the COVID-19 declaration under Section 564(b)(1)  of the Act, 21 U.S.C. section 360bbb-3(b)(1), unless the authorization is terminated or revoked.  Performed at [facility], [place] [address], [place], [place] [address]   Culture, blood (Routine X 2) w Reflex to ID Panel     Status: None (Preliminary result)   Collection Time: 03/01/22 11:11 PM   Specimen: BLOOD  Result Value Ref Range Status   Specimen Description BLOOD SITE NOT SPECIFIED  Final   Special Requests   Final    BOTTLES DRAWN AEROBIC AND ANAEROBIC Blood Culture adequate volume   Culture   Final    NO GROWTH < 12 HOURS Performed at [facility], [place] [address] [place], [place] [address]    Report Status PENDING  Incomplete     Labs: BNP (last 3 results) No results for input(s): "BNP" in the last 8760 hours. Basic Metabolic Panel: Recent Labs  Lab 03/01/22 0928 03/01/22 2221 03/03/22 0149  NA 135  --  140  K 3.7  --  3.8  CL 97*  --  109  CO2 29  --  25  GLUCOSE 111*  --  112*  BUN 22  --  23  CREATININE 0.98 1.03 1.02  CALCIUM 8.8*  --  8.3*   Liver Function Tests: Recent Labs  Lab 03/01/22 0928  AST 18  ALT 12  ALKPHOS 65  BILITOT 0.8  PROT 6.4*  ALBUMIN 3.1*   No results for input(s): "LIPASE", "AMYLASE" in the last 168 hours. No results for input(s): "AMMONIA" in the last 168 hours. CBC: Recent Labs  Lab 03/01/22 0928 03/01/22 2221 03/01/22 2310 03/02/22 0405 03/02/22 0835 03/03/22 0149 03/04/22 0106  WBC 4.6 4.1  --   --   --  5.2 4.7  NEUTROABS 3.2  --   --   --   --   --   --   HGB 10.8* 8.6* 8.3* 8.8* 8.3* 8.2* 8.5*  HCT 32.8* 27.2* 25.6*  --   --  24.6* 26.5*  MCV 95.1 97.1  --   --   --  94.6 95.7  PLT 126* 104*  --   --   --  108* 103*   Cardiac Enzymes: No results for input(s): "CKTOTAL", "CKMB", "CKMBINDEX", "TROPONINI" in the last 168 hours. BNP: Invalid input(s): "POCBNP" CBG: No results for input(s): "GLUCAP" in the last 168 hours. D-Dimer Recent Labs    03/02/22 0035  DDIMER 10.07*   Hgb A1c No  results for input(s): "HGBA1C" in the last 72 hours. Lipid Profile No results for input(s): "CHOL", "HDL", "LDLCALC", "TRIG", "CHOLHDL", "LDLDIRECT" in the last 72 hours. Thyroid function studies Recent Labs    03/01/22 2221  TSH 3.252   Anemia work up Recent Labs    03/01/22 2221  03/02/22 0405  VITAMINB12  --  558  FOLATE  --  >40.0  FERRITIN 102  --   TIBC  --  231*  IRON  --  61  RETICCTPCT  --  1.8   Urinalysis    Component Value Date/Time   COLORURINE AMBER (A) 03/01/2022 1741   APPEARANCEUR HAZY (A) 03/01/2022 1741   LABSPEC 1.013 03/01/2022 1741   PHURINE 5.0 03/01/2022 1741   GLUCOSEU NEGATIVE 03/01/2022 1741   HGBUR NEGATIVE 03/01/2022 1741   BILIRUBINUR NEGATIVE 03/01/2022 1741   KETONESUR NEGATIVE 03/01/2022 1741   PROTEINUR NEGATIVE 03/01/2022 1741   NITRITE NEGATIVE 03/01/2022 1741   LEUKOCYTESUR NEGATIVE 03/01/2022 1741   Sepsis Labs Recent Labs  Lab 03/01/22 0928 03/01/22 2221 03/03/22 0149 03/04/22 0106  WBC 4.6 4.1 5.2 4.7   Microbiology Recent Results (from the past 240 hour(s))  Resp panel by RT-PCR (RSV, Flu A&B, Covid) Anterior Nasal Swab     Status: None   Collection Time: 03/01/22  9:35 PM   Specimen: Anterior Nasal Swab  Result Value Ref Range Status   SARS Coronavirus 2 by RT PCR NEGATIVE NEGATIVE Final    Comment: (NOTE) SARS-CoV-2 target nucleic acids are NOT DETECTED.  The SARS-CoV-2 RNA is generally detectable in upper respiratory specimens during the acute phase of infection. The lowest concentration of SARS-CoV-2 viral copies this assay can detect is 138 copies/mL. A negative result does not preclude SARS-Cov-2 infection and should not be used as the sole basis for treatment or other patient management decisions. A negative result may occur with  improper specimen collection/handling, submission of specimen other than nasopharyngeal swab, presence of viral mutation(s) within the areas targeted by this assay, and inadequate  number of viral copies(<138 copies/mL). A negative result must be combined with clinical observations, patient history, and epidemiological information. The expected result is Negative.  Fact Sheet for Patients:  EntrepreneurPulse.com.au  Fact Sheet for Healthcare Providers:  IncredibleEmployment.be  This test is no t yet approved or cleared by the [place] FDA and  has been authorized for detection and/or diagnosis of SARS-CoV-2 by FDA under an Emergency Use Authorization (EUA). This EUA will remain  in effect (meaning this test can be used) for the duration of the COVID-19 declaration under Section 564(b)(1) of the Act, 21 U.S.C.section 360bbb-3(b)(1), unless the authorization is terminated  or revoked sooner.       Influenza A by PCR NEGATIVE NEGATIVE Final   Influenza B by PCR NEGATIVE NEGATIVE Final    Comment: (NOTE) The Xpert Xpress SARS-CoV-2/FLU/RSV plus assay is intended as an aid in the diagnosis of influenza from Nasopharyngeal swab specimens and should not be used as a sole basis for treatment. Nasal washings and aspirates are unacceptable for Xpert Xpress SARS-CoV-2/FLU/RSV testing.  Fact Sheet for Patients: EntrepreneurPulse.com.au  Fact Sheet for Healthcare Providers: IncredibleEmployment.be  This test is not yet approved or cleared by the [place] FDA and has been authorized for detection and/or diagnosis of SARS-CoV-2 by FDA under an Emergency Use Authorization (EUA). This EUA will remain in effect (meaning this test can be used) for the duration of the COVID-19 declaration under Section 564(b)(1) of the Act, 21 U.S.C. section 360bbb-3(b)(1), unless the authorization is terminated or revoked.     Resp Syncytial Virus by PCR NEGATIVE NEGATIVE Final    Comment: (NOTE) Fact Sheet for Patients: EntrepreneurPulse.com.au  Fact Sheet for Healthcare  Providers: IncredibleEmployment.be  This test is not yet approved or cleared by the [place] and  has been authorized for detection and/or diagnosis of SARS-CoV-2 by FDA under an Emergency Use Authorization (EUA). This EUA will remain in effect (meaning this test can be used) for the duration of the COVID-19 declaration under Section 564(b)(1) of the Act, 21 U.S.C. section 360bbb-3(b)(1), unless the authorization is terminated or revoked.  Performed at [facility], [place] [address] [place], [place] [address]   Culture, blood (Routine X 2) w Reflex to ID Panel     Status: None (Preliminary result)   Collection Time: 03/01/22 11:11 PM   Specimen: BLOOD  Result Value Ref Range Status   Specimen Description BLOOD SITE NOT SPECIFIED  Final   Special Requests   Final    BOTTLES DRAWN AEROBIC AND ANAEROBIC Blood Culture adequate volume   Culture   Final    NO GROWTH < 12 HOURS Performed at [facility], [place] [address], [place], [place] [address]    Report Status PENDING  Incomplete     Time coordinating discharge: Over 30 minutes  SIGNED:   Coulson Wehner J [place] DO  [facility] 03/04/2022, 10:08 AM

## 2022-03-04 NOTE — Progress Notes (Signed)
    Durable Medical Equipment  (From admission, onward)           Start     Ordered   03/04/22 1035  For home use only DME 3 n 1  Once       Comments: Patient is unable to ambulate to the bathroom and requires assistance from another person for toileting needs   03/04/22 1036   03/03/22 1741  For home use only DME lightweight manual wheelchair with seat cushion  Once       Comments: Patient suffers from cancer which impairs their ability to perform daily activities like bathing in the home.  A walker will not resolve  issue with performing activities of daily living. A wheelchair will allow patient to safely perform daily activities. Patient is not able to propel themselves in the home using a standard weight wheelchair due to general weakness. Patient can self propel in the lightweight wheelchair. Length of need Lifetime. Accessories: elevating leg rests (ELRs), wheel locks, extensions and anti-tippers.   03/03/22 1741   03/03/22 1740  For home use only DME Hospital bed  Once       Question Answer Comment  Length of Need Lifetime   Patient has (list medical condition): end stage prostate cancer   The above medical condition requires: Patient requires the ability to reposition frequently   Bed type Semi-electric   Hoyer Lift Yes   Trapeze Bar Yes   Support Surface: Low Air loss Mattress      03/03/22 1741

## 2022-03-04 NOTE — Progress Notes (Cosign Needed)
    Durable Medical Equipment  (From admission, onward)           Start     Ordered   03/04/22 1058  For home use only DME Hospital bed  Once       Question Answer Comment  Length of Need Lifetime   Patient has (list medical condition): end stage prostate cancer   The above medical condition requires: Patient requires the ability to reposition frequently   Bed type Semi-electric   Hoyer Lift Yes   Trapeze Bar Yes   Support Surface: Gel Overlay      03/04/22 1057   03/04/22 1035  For home use only DME 3 n 1  Once       Comments: Patient is unable to ambulate to the bathroom and requires assistance from another person for toileting needs   03/04/22 1036   03/03/22 1741  For home use only DME lightweight manual wheelchair with seat cushion  Once       Comments: Patient suffers from cancer which impairs their ability to perform daily activities like bathing in the home.  A walker will not resolve  issue with performing activities of daily living. A wheelchair will allow patient to safely perform daily activities. Patient is not able to propel themselves in the home using a standard weight wheelchair due to general weakness. Patient can self propel in the lightweight wheelchair. Length of need Lifetime. Accessories: elevating leg rests (ELRs), wheel locks, extensions and anti-tippers.   03/03/22 1741

## 2022-03-04 NOTE — Progress Notes (Signed)
ANTICOAGULATION CONSULT NOTE - Follow Up Consult  Pharmacy Consult for heparin Indication: DVT  Labs: Recent Labs    03/01/22 0928 03/01/22 1741 03/01/22 2221 03/01/22 2310 03/02/22 0405 03/02/22 2251 03/03/22 0149 03/04/22 0106  HGB 10.8*  --  8.6* 8.3*   < >  --  8.2* 8.5*  HCT 32.8*  --  27.2* 25.6*  --   --  24.6* 26.5*  PLT 126*  --  104*  --   --   --  108* 103*  HEPARINUNFRC  --   --   --   --   --  0.59 0.55 0.76*  CREATININE 0.98  --  1.03  --   --   --  1.02  --   TROPONINIHS 7 7  --   --   --   --   --   --    < > = values in this interval not displayed.   Assessment: 77yo male now slightly supratherapeutic on heparin after two levels at goal; no infusion issues or signs of bleeding per RN.  Goal of Therapy:  Heparin level 0.3-0.7 units/ml   Plan:  Will decrease heparin infusion by 1 unit/kg/hr to 1300 units/hr and check level in 6 hours.    Wynona Neat, PharmD, BCPS  03/04/2022,3:06 AM

## 2022-03-04 NOTE — Progress Notes (Signed)
Patient thoracentesis site bleed in moderate amount, dressing changed.Dry and intact now.Will continue to monitor.

## 2022-03-05 ENCOUNTER — Telehealth: Payer: Self-pay

## 2022-03-05 NOTE — Telephone Encounter (Signed)
T/C from [place] with [facility] stating pt had a Hosipce order while he was in the hospital but they have decided to hold off on Hospice and do Palliative Care if it is approved by Dr Alen Blew.Marland Kitchen

## 2022-03-06 LAB — CULTURE, BLOOD (ROUTINE X 2)
Culture: NO GROWTH
Special Requests: ADEQUATE

## 2022-03-07 ENCOUNTER — Telehealth: Payer: Self-pay | Admitting: *Deleted

## 2022-03-07 ENCOUNTER — Telehealth: Payer: Self-pay

## 2022-03-07 NOTE — Telephone Encounter (Signed)
(  1:55 pm) PC SW scheduled an initial palliative care visit for patient with his wife. RN/SW team to see patient on 03/13/22 '@11'$ :30 am.

## 2022-03-07 NOTE — Telephone Encounter (Signed)
Returned PC to [place] with [facility], no answer, left VM - she called yesterday to ask if Dr Alen Blew is agreeable to decision change by the patient, he requested hospice care but has now decided he would rather receive palliative care.  Dr Alen Blew is agreeable.  Informed Erline Levine to call this office with any questions, [phone].

## 2022-03-11 ENCOUNTER — Other Ambulatory Visit: Payer: Self-pay | Admitting: Internal Medicine

## 2022-03-11 MED ORDER — TRAMADOL HCL 50 MG PO TABS: 50.0000 mg | ORAL_TABLET | Freq: Four times a day (QID) | ORAL | 0 refills | Status: DC | PRN

## 2022-03-13 ENCOUNTER — Other Ambulatory Visit: Payer: Medicare Other

## 2022-03-13 VITALS — BP 110/60 | HR 94 | Temp 97.5°F | Resp 20

## 2022-03-13 DIAGNOSIS — Z515 Encounter for palliative care: Secondary | ICD-10-CM

## 2022-03-13 NOTE — Progress Notes (Signed)
1116 Palliative Care Note  PATIENT NAME: [name] DOB: June 04, 1945 MRN: [id number]  PRIMARY CARE PROVIDER: [name], MD  RESPONSIBLE PARTY:  Acct ID - Guarantor Home Phone Work Phone Relationship Acct Type  [id number] - [place]* [phone]  Self P/F     [address] [place], [place] [address]   RN/SW completed in home palliative care visit. Wife and sister in law were present also.     HISTORY OF PRESENT ILLNESS:  77 y.o. 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 Patient now with worsening lower extremity edema, unable to bear weight on his legs.  Socially: Pt lives in single story home with wife. Has 1 son who lives locally and has just recently lost his brother.   Cognitive: Pt is alert and oriented. Asking appropriate questions and responds appropriately.   Appetite: Pt and wife reports that his appetite has decreased in last several weeks to about 50% of what it normally was. Pt reports that he gets full easy and doesn't get hungry much.    Mobility: Pt is completely immobile from waist down. No feeling or sensations when RN tests legs. Reports that he has a large tumor pressing on his T3 which has caused this. Wife reports that they have a hoyer lift but was not instructed on how to use it.    Sleeping Pattern: Pt reports that he has been sleeping off and on because he has been having worsening of pain in early morning hours. Reports that he has tramadol for pain and that it does help but takes a little while to start working and then he is awake. RN suggested maybe trying to take the tramadol right before bed to see if that would help prevent the worst of the pain. Pt and wife agreed to try.  GI/GU: Pt now incontinent of bowel and bladder since recent hospitalization and  tumor involvement of T3. Wife performs personal care on pt.  Pain: Reports that he has pain "around his abdomen and back on his torso" Reports the pain as severe when it occurs. Also reports that he has spasms to legs at times, "but they only last a couple of seconds".  Palliative Care/ Hospice: RN explained role and purpose of palliative care including visit frequency. Also discussed benefits of hospice care as well as the differences between the two with patient.    Goals of Care: Pt wants to remain in home with wife. Not interested in going back to hospital for care.     CODE STATUS: DNR  ADVANCED DIRECTIVES: N MOST FORM: N PPS:    PHYSICAL EXAM:   VITALS: Today's Vitals   03/13/22 1140  BP: 110/60  Pulse: 94  Resp: 20  Temp: (!) 97.5 F (36.4 C)  TempSrc: Temporal  SpO2: 95%  PainSc: 0-No pain    LUNGS: Breath sounds clear bilaterally and diminished in bases. No coughing CARDIAC: Regular. No JVD EXTREMITIES: Moves BUE with no difficulties, good strength, good PMS. BLE immobile. Pt not able to lift or move either leg. 2+ edema noted to both legs from toes to knees. Pulses present, skin is warm and dry. Padded booties on each foot to assist in the prevention of breakdown. NEURO: A+O x 4. No issues with short term or long term recall. No c/o headaches or dizziness.   Summary of visit: RN/SW arrived  for initial palliative care visit. Pt noted to be lying in hospital bed in living room. Wife and sister in law (nurse at cancer center) present for visit. Occupational therapist from [facility] finishing up with pt upon our arrival. Pt is alert and pleasant. Reports that he has had prostate cancer for the last 20 years with some mets, however, he started noticing weakness and inability to move legs and went to hospital recently. EMR also reports that while hospitalized, pt had bilateral lower ext DVTs and a pleural effusion for which he underwent a thoracentesis. Pt underwent aggressive  PT/OT in hospital and was discharged with both, however he is "not sure if it is gonna help any." Pt waffling between hospice referral and palliative care following him while we were there. RN reassured both he and wife that the decision did not have to be made today. It could be made anytime and changed after that.   Next appt scheduled for 03/28/22 at [facility], [place]

## 2022-03-14 ENCOUNTER — Encounter: Payer: Self-pay | Admitting: Oncology

## 2022-03-28 ENCOUNTER — Other Ambulatory Visit: Payer: Medicare Other

## 2022-04-16 ENCOUNTER — Inpatient Hospital Stay: Payer: Self-pay

## 2022-04-16 ENCOUNTER — Inpatient Hospital Stay: Payer: Self-pay | Admitting: Hematology and Oncology

## 2022-06-27 DEATH — deceased

## 2022-07-10 NOTE — Progress Notes (Signed)
Deceased patient.

## 2023-06-14 IMAGING — CT CT ANGIO CHEST
2 of 7 series · 16 of 46 positions shown · IV contrast (OMNIPAQUE)
Comparison: Chest abdomen pelvis CT from November 10, 2019.

CLINICAL DATA: Suspected pulmonary embolism in a 75-year-old male.

EXAM:
CT ANGIOGRAPHY CHEST WITH CONTRAST
TECHNIQUE: Multidetector CT imaging of the chest was performed using the
standard protocol during bolus administration of intravenous
contrast. Multiplanar CT image reconstructions and MIPs were
obtained to evaluate the vascular anatomy.

[Series 6: thins · axial · 0.76mm/px · z∈[-308,-27]mm · 14 of 319 slices shown]
[im 19/319  lung]
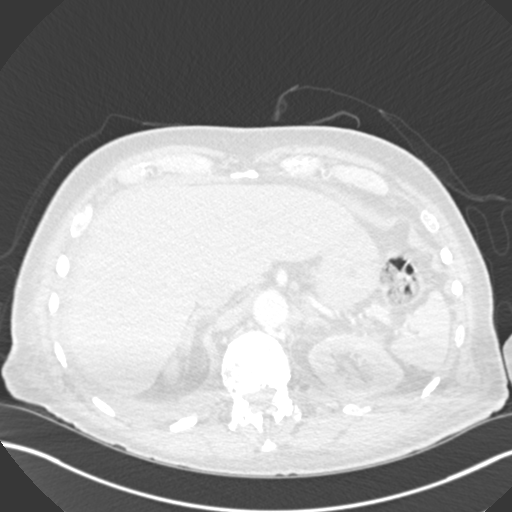
[im 38/319  soft-tissue]
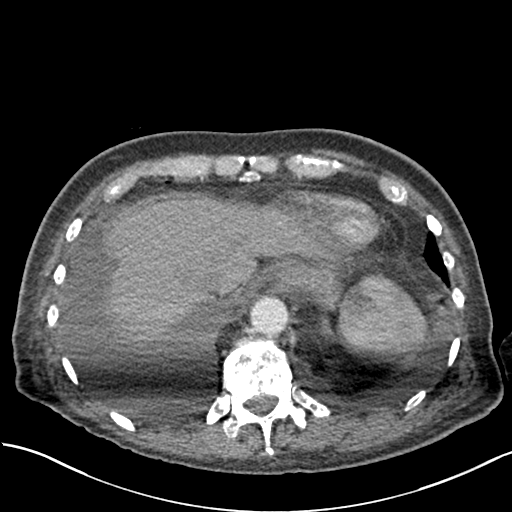
[im 57/319  lung]
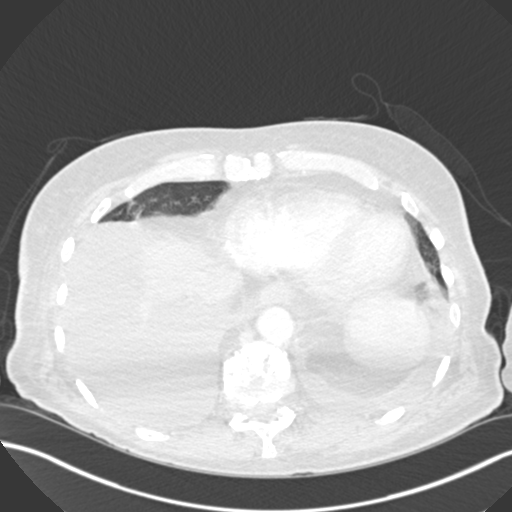
[im 94/319  soft-tissue]
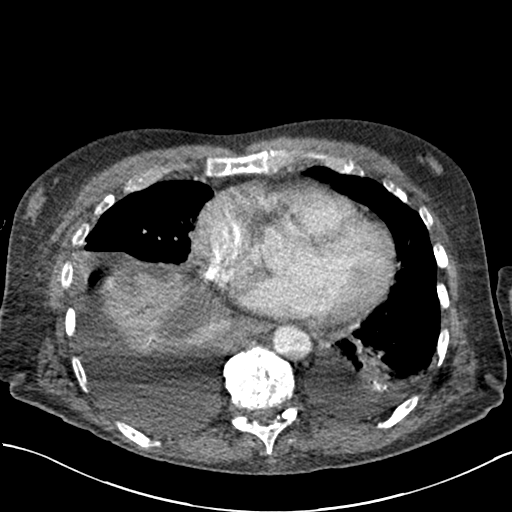
[im 113/319  lung]
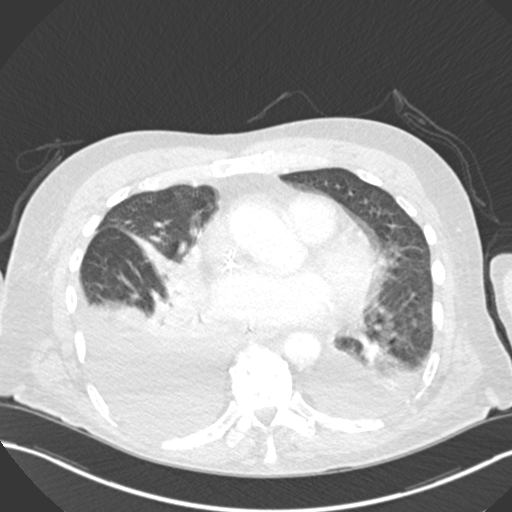
[im 131/319  soft-tissue]
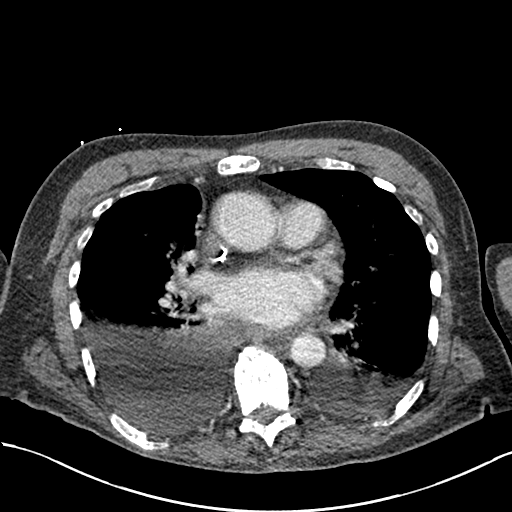
[im 150/319  lung]
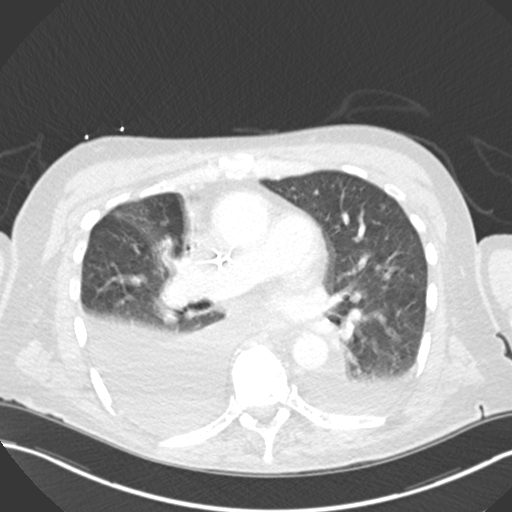
[im 169/319  soft-tissue]
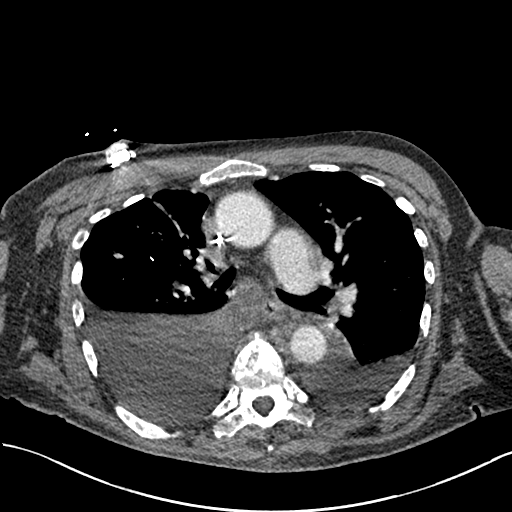
[im 188/319  lung]
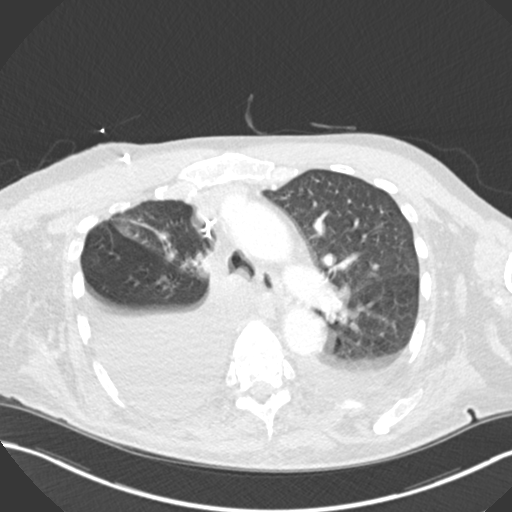
[im 206/319  soft-tissue]
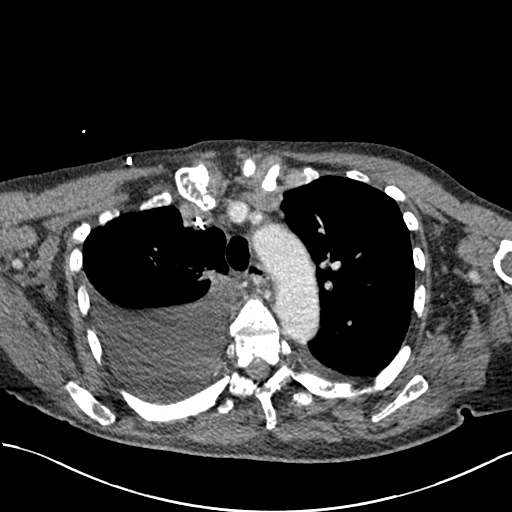
[im 244/319  lung]
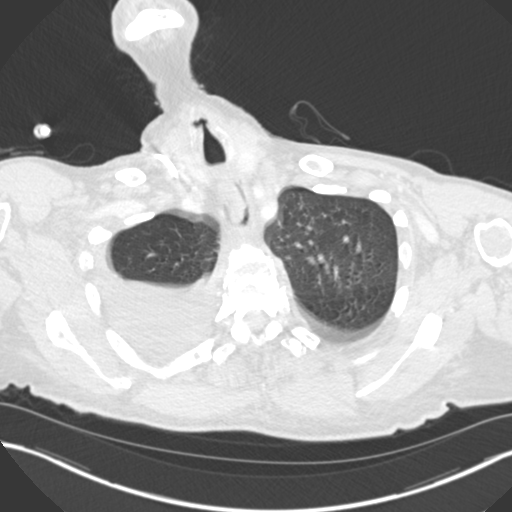
[im 262/319  soft-tissue]
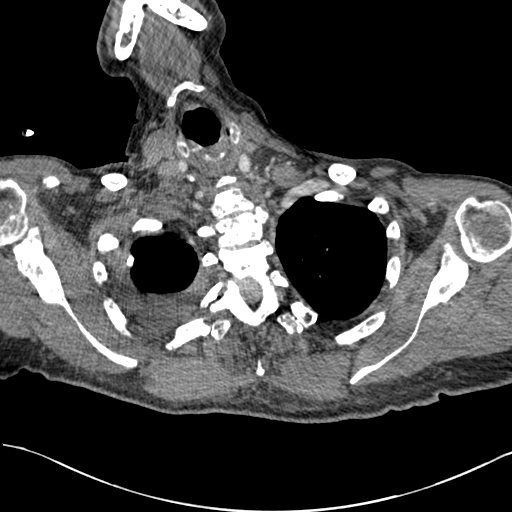
[im 281/319  lung]
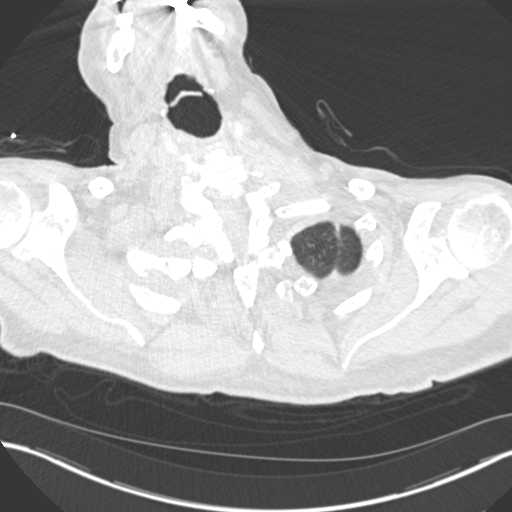
[im 300/319  soft-tissue]
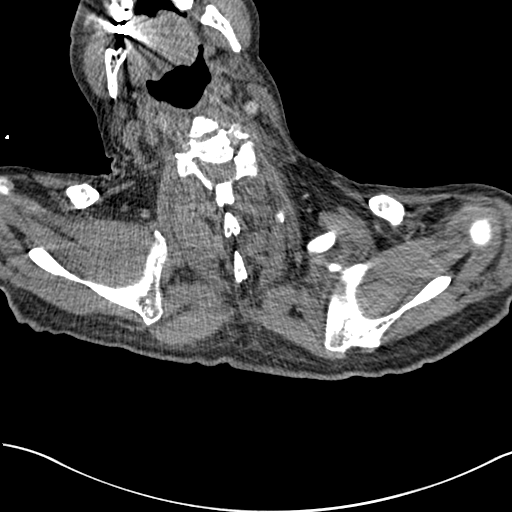

[Series 8: coronal mpr · coronal · 0.63mm/px · 2 of 106 slices shown]
[im 36/106  soft-tissue]
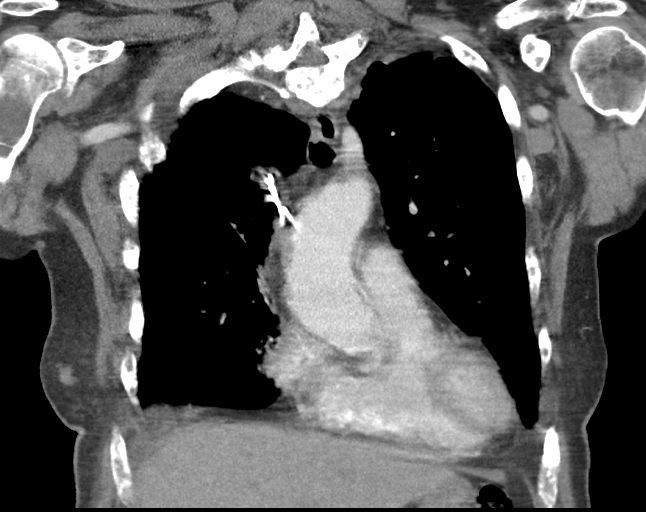
[im 71/106  soft-tissue]
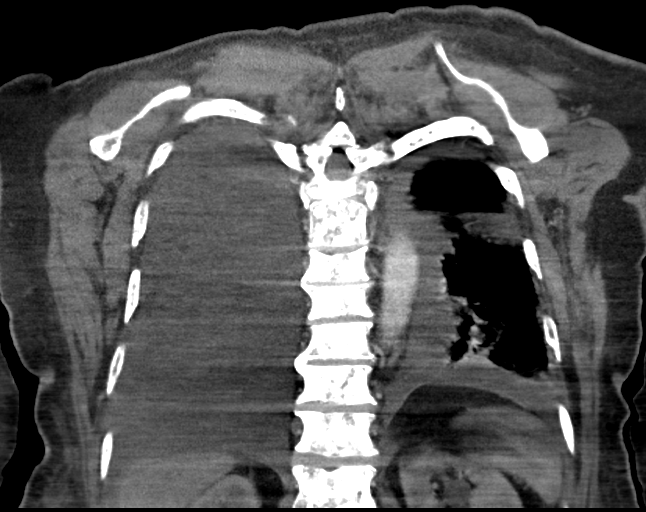

[16 of 46 positions shown; findings below may reference images not displayed]

RADIATION DOSE REDUCTION: This exam was performed according to the
departmental dose-optimization program which includes automated
exposure control, adjustment of the mA and/or kV according to
patient size and/or use of iterative reconstruction technique.

CONTRAST:  75mL OMNIPAQUE IOHEXOL 350 MG/ML SOLN
FINDINGS: Cardiovascular: Normal caliber of the thoracic aorta. Heart size
mildly enlarged without substantial pericardial effusion.
RIGHT-sided Port-A-Cath terminates at the caval to atrial junction.

Central pulmonary arteries with opacification 2 only 158 Hounsfield
units, study is very limited with respect to bolus timing and also
limited due to presence of profound respiratory motion. No central
pulmonary embolism.

Mediastinum/Nodes: Esophagus grossly unremarkable, no signs of
adenopathy in the chest.

Lungs/Pleura: Large dependent RIGHT-sided pleural effusion. Small to
moderate LEFT-sided pleural effusion in the dependent LEFT chest.
Medial RIGHT middle lobe collapse/consolidation. Near complete
volume loss associated with RIGHT lower lobe.

In the aerated RIGHT lower lobe is a pulmonary nodule that measures
approximately 8 x 5 mm, grossly stable accounting for respiratory
motion compared to the previous exam. No pneumothorax.

Small enhancing nodule along the dependent RIGHT chest in the
extrapleural fat (image 196/6) compatible with worsening pleural
disease, this measures 9 mm previously 9 mm greatest dimension but
shows a spherical appearance today previously plaque-like.

Airways, major airways are patent.

Upper Abdomen: Incidental imaging of upper abdominal contents
without acute process.

Musculoskeletal: Interval increase in the amount of sclerosis
associated with visible skeletal structures near confluent sclerotic
changes previously with nearly all bones affected, now clearly with
all visible bones affected and with near confluent sclerosis of the
spine increasing sclerosis of the sternum, bilateral clavicles,
bilateral scapulae and ribs. This is compared to imaging from 2328
where. Also increasing sclerosis as compared to imaging from 9099,
PET evaluation from Tuesday October, 2020. Increased loss of height
associated with a compression fracture at the T3 level since more
remote imaging. This shows near vertebra plana, greater than 70-80%
loss of height with mild retropulsion. Retropulsion was present
previously dating back to 2328 and is similar.

Review of the MIP images confirms the above findings.
IMPRESSION: 1. No signs of central pulmonary embolism. Study limited by
respiratory motion and bolus timing.
2. No central pulmonary embolism, limited exam due to bolus timing
and also limited due to presence of profound respiratory motion.
3. Large dependent RIGHT-sided pleural effusion with small to
moderate LEFT-sided pleural effusion. Nodularity in the extrapleural
fat raising the question of disease along the pleural surface or
just outside the pleura in the dependent RIGHT chest, attention on
follow-up.
4. Areas of collapse/consolidation at the lung bases favored to be
compressive atelectasis. Correlate with any signs of infection.
5. Right lower lobe pulmonary nodule measuring approximately 8 x 5
mm, grossly stable accounting for respiratory motion compared to the
previous exam.
6. Increased loss of height associated with a compression fracture
at the T3 level since more remote imaging. Retropulsion is similar
to previous imaging. Correlate with any new pain in this location.
7. Increasingly sclerotic appearance of all visible bones which are
now diffusely sclerotic particularly in the spine.

Aortic Atherosclerosis ([id number]).

## 2023-06-15 IMAGING — US US THORACENTESIS ASP PLEURAL SPACE W/IMG GUIDE
1 series · 5 of 5 positions shown · non-contrast
Comparison: none

INDICATION: Shortness of breath, previous CT scan showed bilateral pleural
effusion right greater than left. Request for therapeutic
thoracentesis.

[Series 1: us thoracentesis asp pleural s mc & wl · 5 of 5 slices shown]
[im 1/5]
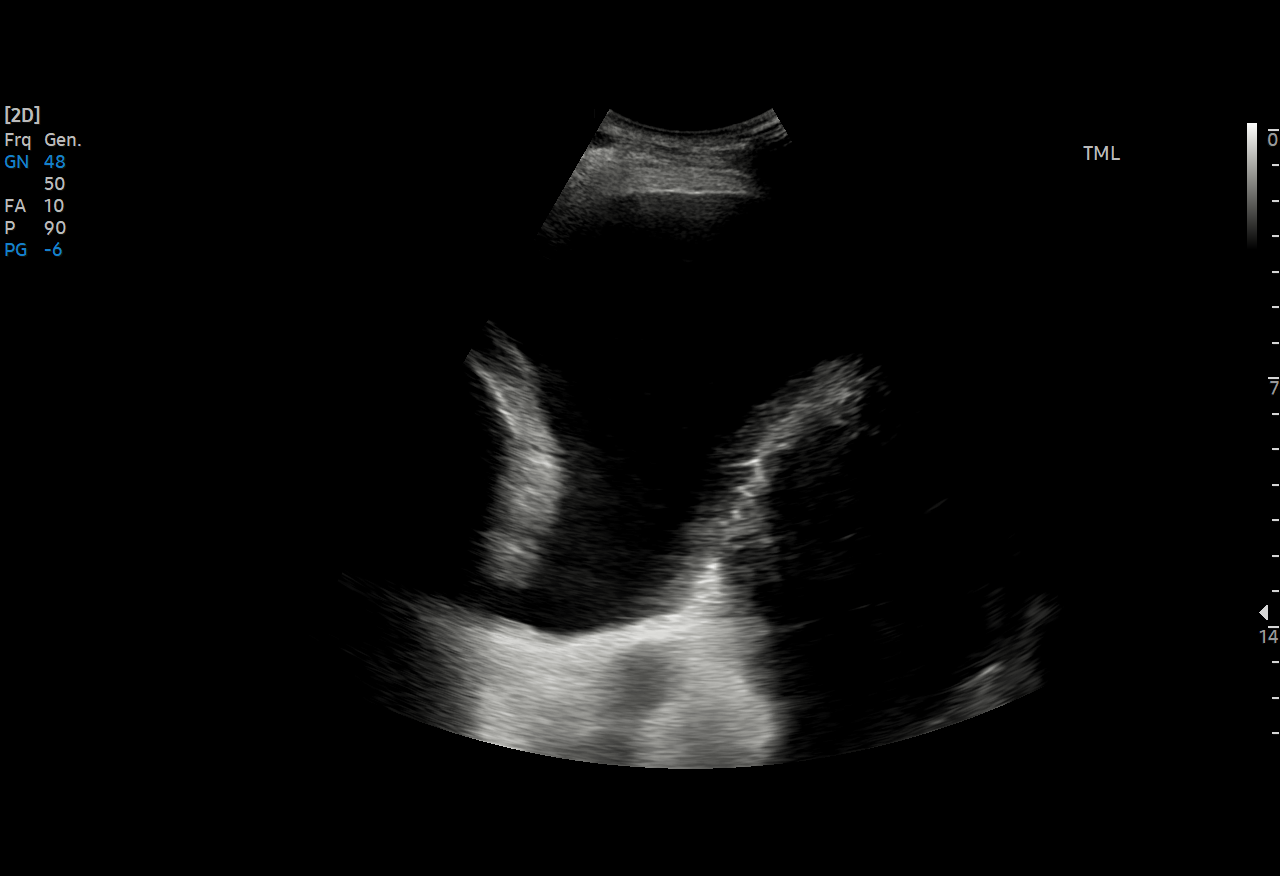
[im 2/5]
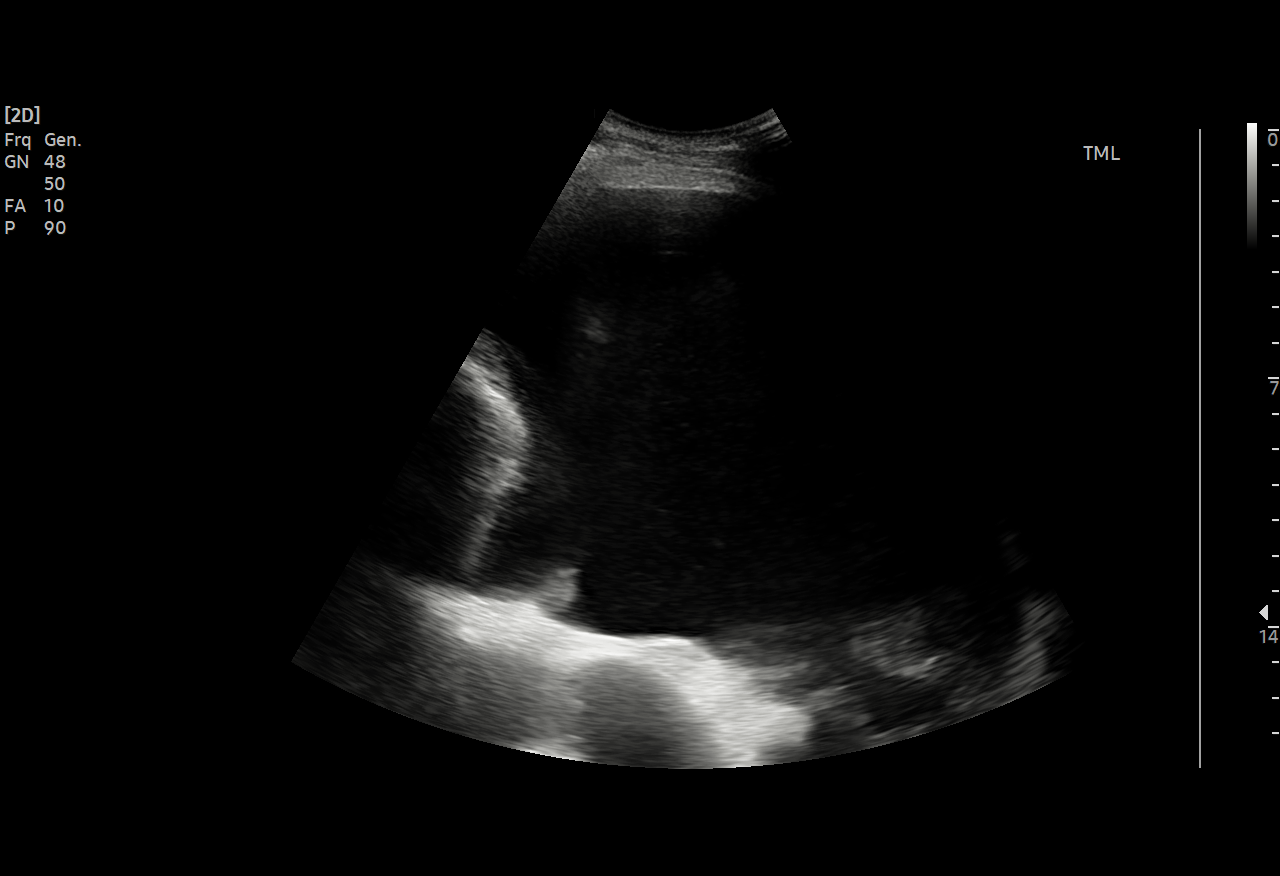
[im 3/5]
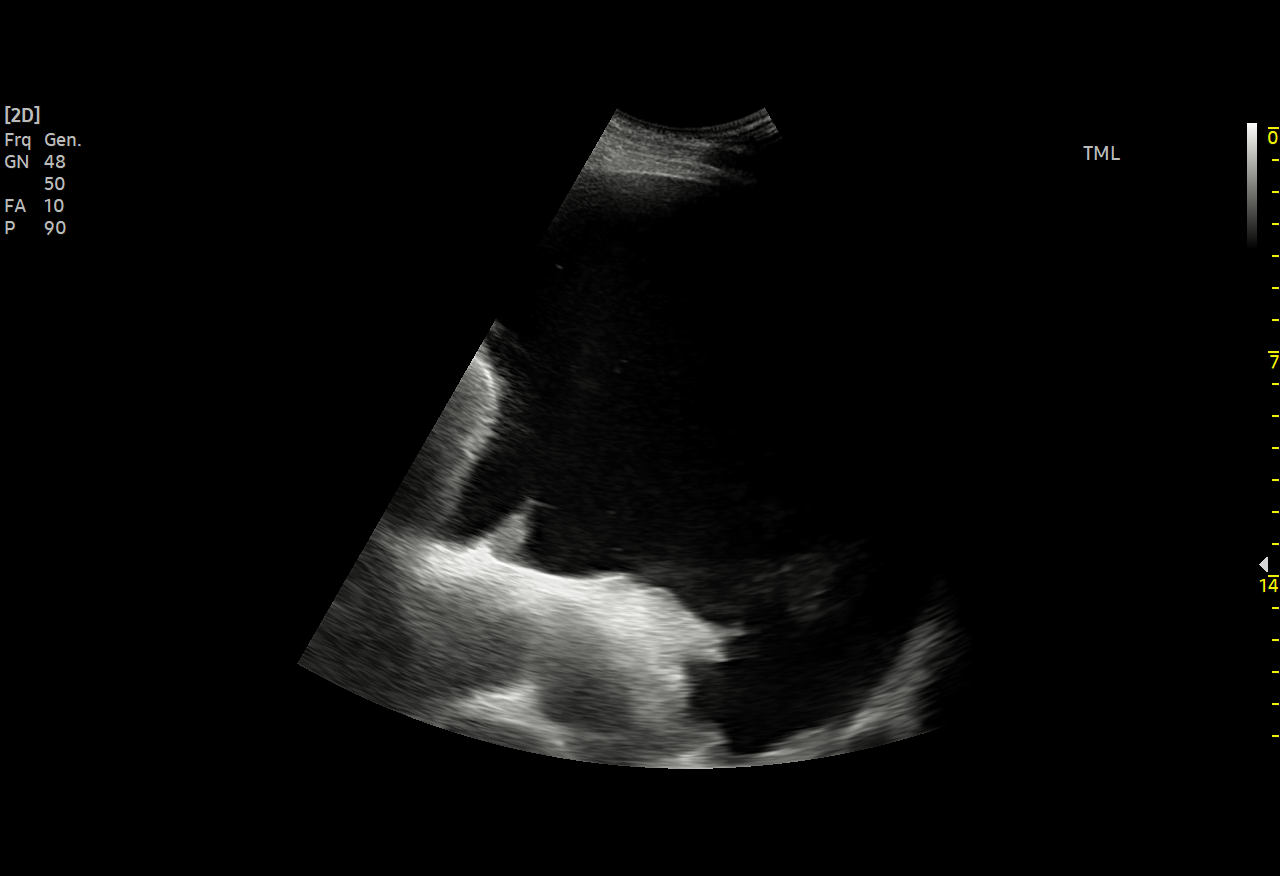
[im 4/5]
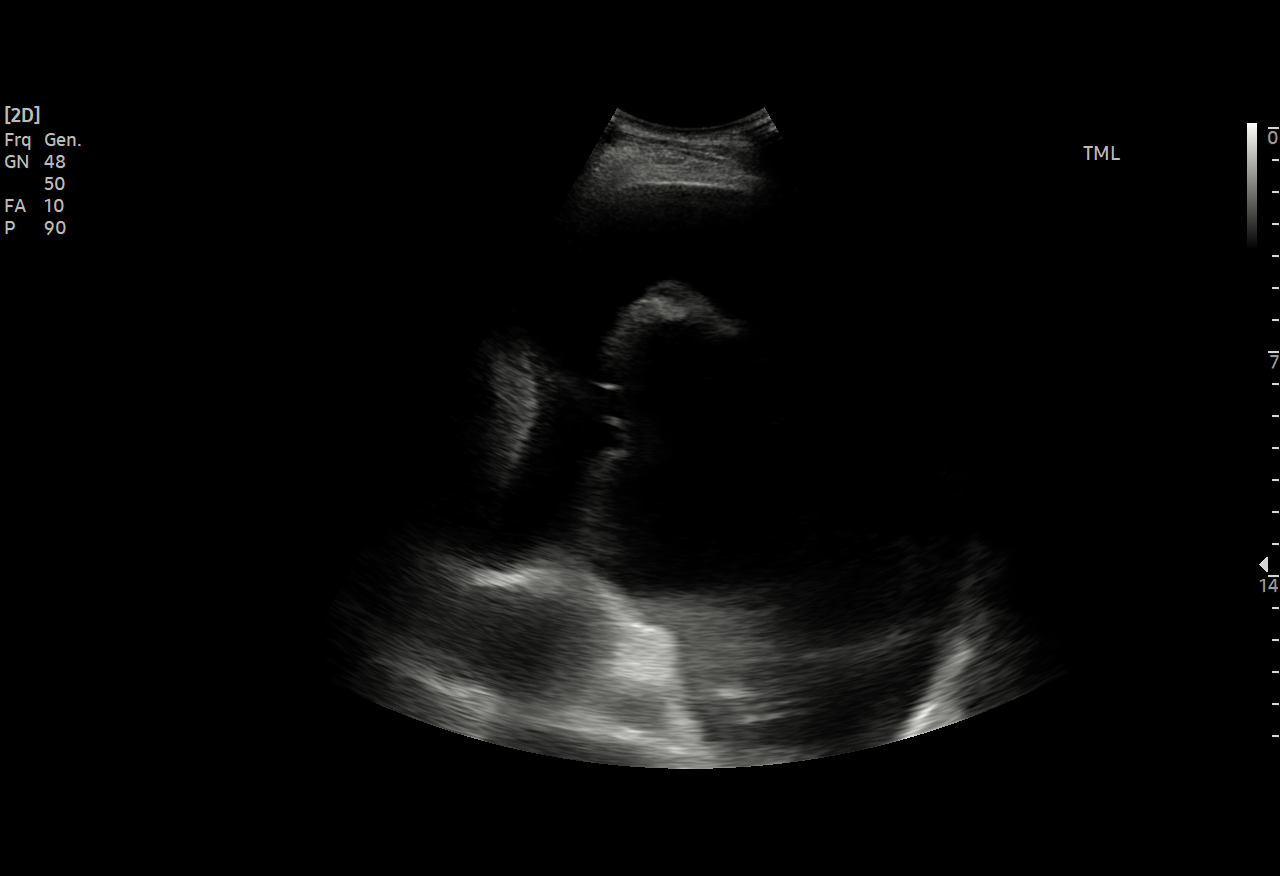
[im 5/5]
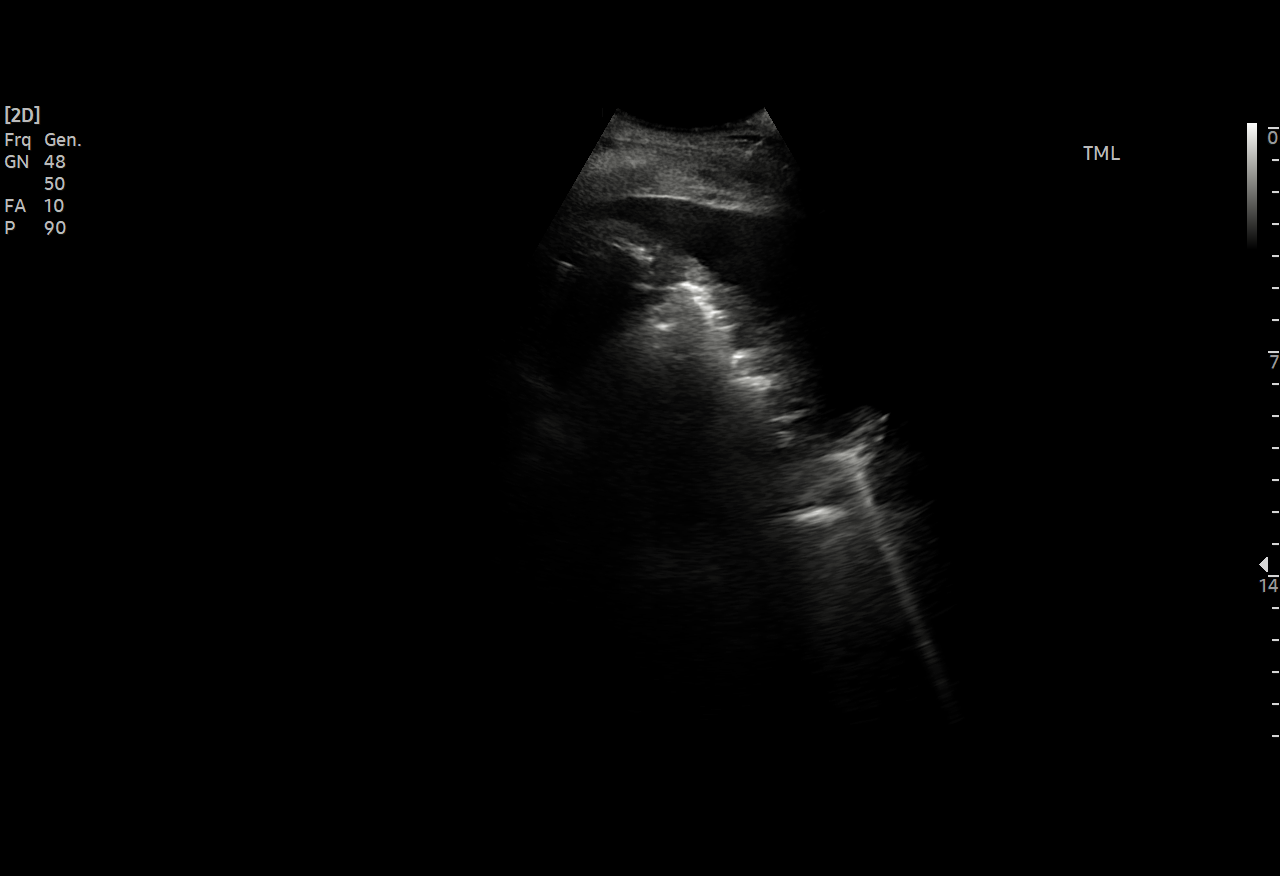

[5 of 5 positions shown; findings below may reference images not displayed]

EXAM:
ULTRASOUND GUIDED RIGHT THORACENTESIS

MEDICATIONS:
5 mL 1% lidocaine

COMPLICATIONS:
None immediate.

PROCEDURE:
An ultrasound guided thoracentesis was thoroughly discussed with the
patient and questions answered. The benefits, risks, alternatives
and complications were also discussed. The patient understands and
wishes to proceed with the procedure. Written consent was obtained.

Ultrasound was performed to localize and mark an adequate pocket of
fluid in the RIGHT chest. The area was then prepped and draped in
the normal sterile fashion. 1% Lidocaine was used for local
anesthesia. Under ultrasound guidance a 6 Fr Safe-T-Centesis
catheter was introduced. Thoracentesis was performed. The catheter
was removed and a dressing applied.
FINDINGS: A total of approximately 1.5 L of hazy amber fluid was removed. Post
procedure chest X-ray reviewed, negative for pneumothorax.
IMPRESSION: Successful ultrasound guided RIGHT thoracentesis yielding 1.5 L of
pleural fluid.

## 2023-06-15 IMAGING — DX DG CHEST 1V
1 series · 1 of 1 positions shown · non-contrast
Comparison: Previous studies including CT done on 05/16/2021

CLINICAL DATA: Right thoracentesis

EXAM:
CHEST  1 VIEW

[chest ap]
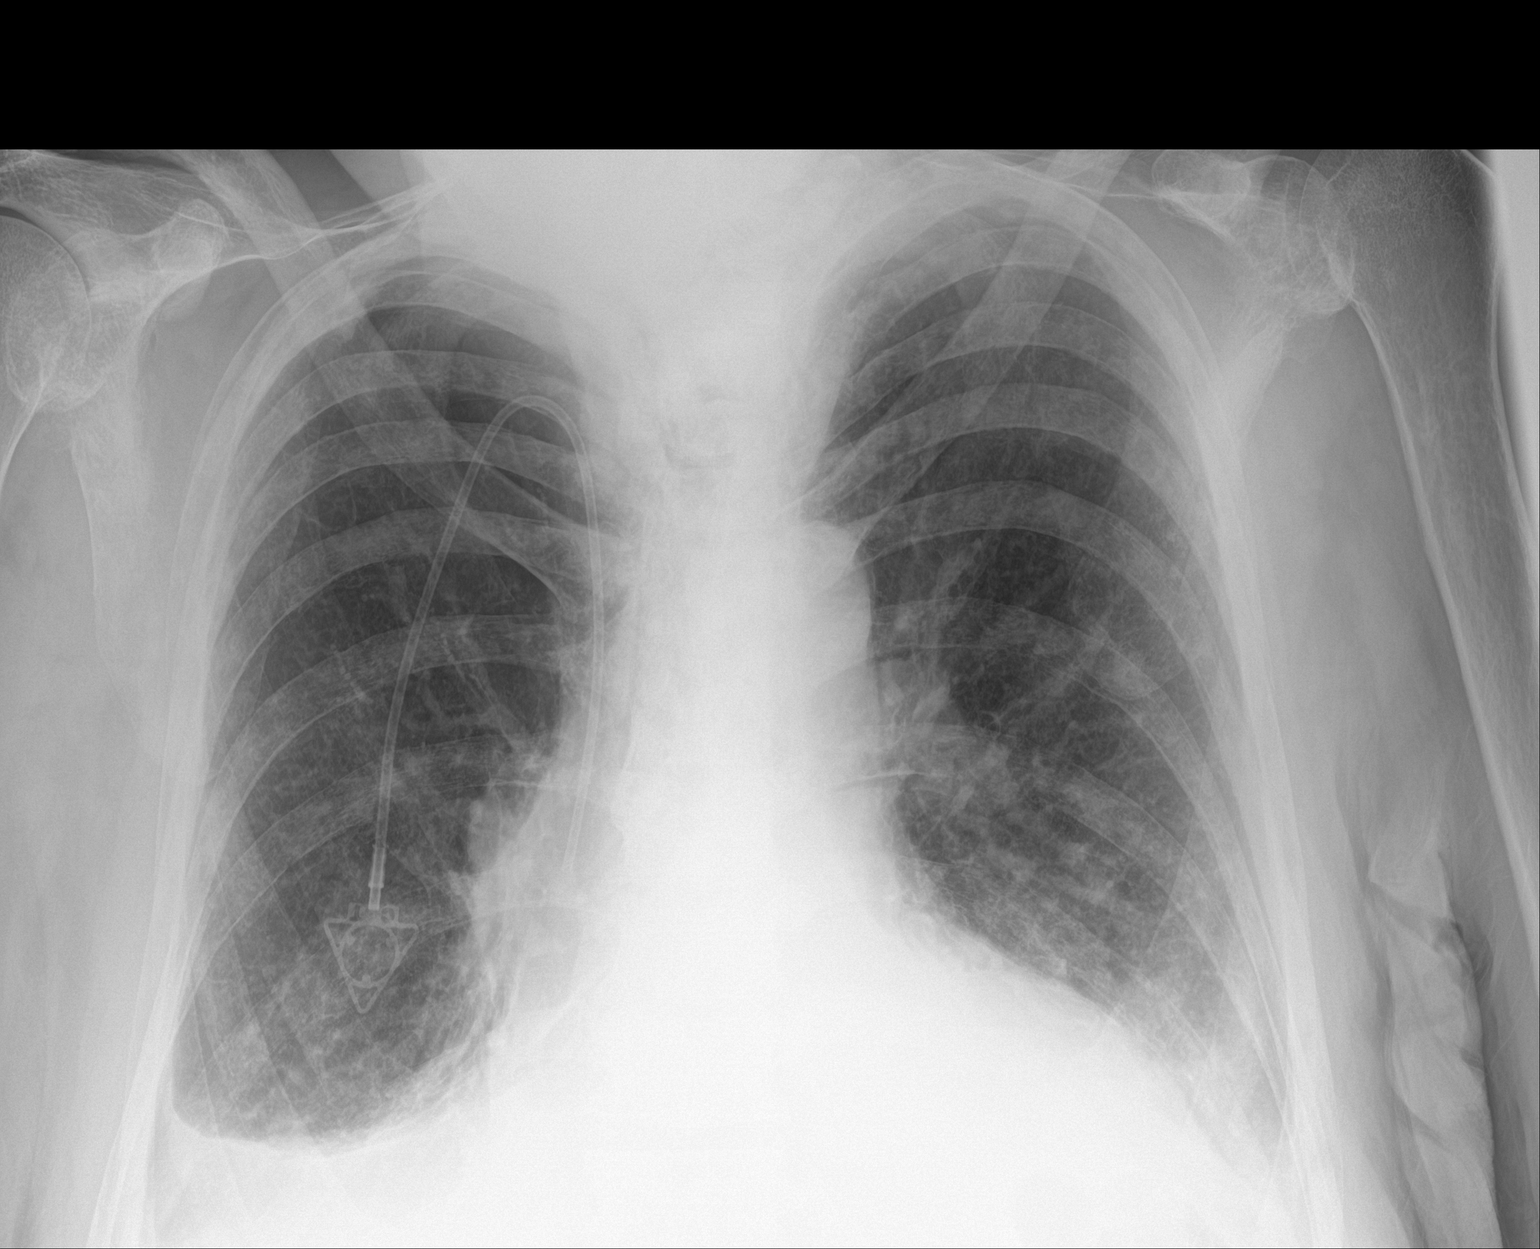

[1 of 1 positions shown; findings below may reference images not displayed]

FINDINGS: Transverse diameter of heart is increased. There is significant
interval decrease in right pleural effusion. There is no
pneumothorax. Small bilateral pleural effusions are seen. Tip of
right chest port is seen in the superior vena cava. Patchy sclerosis
seen in the bony structures suggest skeletal metastatic disease.
IMPRESSION: There is significant decrease in right pleural effusion from right
thoracentesis. There is no pneumothorax. Small bilateral pleural
effusions are seen.
